# Patient Record
Sex: Female | Born: 1970 | Race: White | Hispanic: No | Marital: Single | Smoking: Current every day smoker
Health system: Southern US, Community
[De-identification: ages and names within clinical notes are randomized; demographics above are authoritative.]

## PROBLEM LIST (undated history)

## (undated) ENCOUNTER — Emergency Department (HOSPITAL_COMMUNITY): Discharge status: Absent without leave | Payer: Self-pay

## (undated) DIAGNOSIS — Z8774 Personal history of (corrected) congenital malformations of heart and circulatory system: Secondary | ICD-10-CM

## (undated) DIAGNOSIS — I480 Paroxysmal atrial fibrillation: Secondary | ICD-10-CM

## (undated) DIAGNOSIS — Z954 Presence of other heart-valve replacement: Secondary | ICD-10-CM

## (undated) DIAGNOSIS — Z8701 Personal history of pneumonia (recurrent): Secondary | ICD-10-CM

## (undated) DIAGNOSIS — R9431 Abnormal electrocardiogram [ECG] [EKG]: Secondary | ICD-10-CM

## (undated) DIAGNOSIS — I2721 Secondary pulmonary arterial hypertension: Secondary | ICD-10-CM

## (undated) DIAGNOSIS — I5042 Chronic combined systolic (congestive) and diastolic (congestive) heart failure: Secondary | ICD-10-CM

## (undated) DIAGNOSIS — I428 Other cardiomyopathies: Secondary | ICD-10-CM

## (undated) DIAGNOSIS — I351 Nonrheumatic aortic (valve) insufficiency: Secondary | ICD-10-CM

## (undated) DIAGNOSIS — Q249 Congenital malformation of heart, unspecified: Secondary | ICD-10-CM

## (undated) DIAGNOSIS — K766 Portal hypertension: Secondary | ICD-10-CM

## (undated) DIAGNOSIS — J449 Chronic obstructive pulmonary disease, unspecified: Secondary | ICD-10-CM

## (undated) DIAGNOSIS — F419 Anxiety disorder, unspecified: Secondary | ICD-10-CM

## (undated) DIAGNOSIS — Q244 Congenital subaortic stenosis: Secondary | ICD-10-CM

## (undated) HISTORY — PX: RECTAL SURGERY: SHX760

## (undated) HISTORY — DX: Secondary pulmonary arterial hypertension: I27.21

## (undated) HISTORY — PX: TUBAL LIGATION: SHX77

## (undated) HISTORY — DX: Portal hypertension: K76.6

## (undated) HISTORY — DX: Other cardiomyopathies: I42.8

## (undated) HISTORY — DX: Paroxysmal atrial fibrillation: I48.0

## (undated) HISTORY — PX: CARDIAC CATHETERIZATION: SHX172

## (undated) HISTORY — DX: Abnormal electrocardiogram (ECG) (EKG): R94.31

---

## 1999-12-08 ENCOUNTER — Encounter: Payer: Self-pay | Admitting: Family Medicine

## 1999-12-08 ENCOUNTER — Ambulatory Visit (HOSPITAL_COMMUNITY): Admission: RE | Admit: 1999-12-08 | Discharge: 1999-12-08 | Payer: Self-pay | Admitting: Family Medicine

## 1999-12-14 ENCOUNTER — Encounter (HOSPITAL_BASED_OUTPATIENT_CLINIC_OR_DEPARTMENT_OTHER): Payer: Self-pay | Admitting: General Surgery

## 1999-12-15 ENCOUNTER — Ambulatory Visit (HOSPITAL_COMMUNITY): Admission: RE | Admit: 1999-12-15 | Discharge: 1999-12-15 | Payer: Self-pay | Admitting: Family Medicine

## 1999-12-18 ENCOUNTER — Ambulatory Visit (HOSPITAL_COMMUNITY): Admission: RE | Admit: 1999-12-18 | Discharge: 1999-12-18 | Payer: Self-pay | Admitting: General Surgery

## 2000-08-14 ENCOUNTER — Encounter: Payer: Self-pay | Admitting: Family Medicine

## 2000-08-14 ENCOUNTER — Encounter: Admission: RE | Admit: 2000-08-14 | Discharge: 2000-08-14 | Payer: Self-pay | Admitting: Family Medicine

## 2000-12-24 ENCOUNTER — Emergency Department (HOSPITAL_COMMUNITY): Admission: EM | Admit: 2000-12-24 | Discharge: 2000-12-24 | Payer: Self-pay | Admitting: Emergency Medicine

## 2001-03-24 ENCOUNTER — Emergency Department (HOSPITAL_COMMUNITY): Admission: EM | Admit: 2001-03-24 | Discharge: 2001-03-24 | Payer: Self-pay | Admitting: Emergency Medicine

## 2001-07-07 ENCOUNTER — Encounter: Payer: Self-pay | Admitting: Emergency Medicine

## 2001-07-07 ENCOUNTER — Emergency Department (HOSPITAL_COMMUNITY): Admission: EM | Admit: 2001-07-07 | Discharge: 2001-07-07 | Payer: Self-pay | Admitting: Emergency Medicine

## 2001-11-26 ENCOUNTER — Emergency Department (HOSPITAL_COMMUNITY): Admission: EM | Admit: 2001-11-26 | Discharge: 2001-11-26 | Payer: Self-pay

## 2003-02-26 ENCOUNTER — Emergency Department (HOSPITAL_COMMUNITY): Admission: EM | Admit: 2003-02-26 | Discharge: 2003-02-26 | Payer: Self-pay | Admitting: *Deleted

## 2003-02-26 ENCOUNTER — Encounter: Payer: Self-pay | Admitting: *Deleted

## 2003-02-26 ENCOUNTER — Encounter: Payer: Self-pay | Admitting: Emergency Medicine

## 2011-03-15 ENCOUNTER — Inpatient Hospital Stay (INDEPENDENT_AMBULATORY_CARE_PROVIDER_SITE_OTHER)
Admission: RE | Admit: 2011-03-15 | Discharge: 2011-03-15 | Disposition: A | Discharge status: Home / Self Care | Payer: Self-pay | Source: Ambulatory Visit | Attending: Family Medicine | Admitting: Family Medicine

## 2011-03-15 DIAGNOSIS — L723 Sebaceous cyst: Secondary | ICD-10-CM

## 2011-03-15 DIAGNOSIS — L02219 Cutaneous abscess of trunk, unspecified: Secondary | ICD-10-CM

## 2011-03-15 DIAGNOSIS — L03319 Cellulitis of trunk, unspecified: Secondary | ICD-10-CM

## 2011-07-24 ENCOUNTER — Encounter: Payer: Self-pay | Admitting: Emergency Medicine

## 2011-07-24 ENCOUNTER — Emergency Department (HOSPITAL_COMMUNITY)
Admission: EM | Admit: 2011-07-24 | Discharge: 2011-07-25 | Disposition: A | Discharge status: Home / Self Care | Payer: Self-pay | Attending: Emergency Medicine | Admitting: Emergency Medicine

## 2011-07-24 DIAGNOSIS — R062 Wheezing: Secondary | ICD-10-CM | POA: Insufficient documentation

## 2011-07-24 DIAGNOSIS — R05 Cough: Secondary | ICD-10-CM | POA: Insufficient documentation

## 2011-07-24 DIAGNOSIS — R0602 Shortness of breath: Secondary | ICD-10-CM | POA: Insufficient documentation

## 2011-07-24 DIAGNOSIS — IMO0001 Reserved for inherently not codable concepts without codable children: Secondary | ICD-10-CM | POA: Insufficient documentation

## 2011-07-24 DIAGNOSIS — R42 Dizziness and giddiness: Secondary | ICD-10-CM | POA: Insufficient documentation

## 2011-07-24 DIAGNOSIS — R059 Cough, unspecified: Secondary | ICD-10-CM | POA: Insufficient documentation

## 2011-07-24 DIAGNOSIS — J159 Unspecified bacterial pneumonia: Secondary | ICD-10-CM | POA: Insufficient documentation

## 2011-07-24 DIAGNOSIS — R0789 Other chest pain: Secondary | ICD-10-CM | POA: Insufficient documentation

## 2011-07-24 DIAGNOSIS — M2550 Pain in unspecified joint: Secondary | ICD-10-CM | POA: Insufficient documentation

## 2011-07-24 DIAGNOSIS — R0989 Other specified symptoms and signs involving the circulatory and respiratory systems: Secondary | ICD-10-CM | POA: Insufficient documentation

## 2011-07-24 DIAGNOSIS — R509 Fever, unspecified: Secondary | ICD-10-CM | POA: Insufficient documentation

## 2011-07-24 NOTE — ED Notes (Signed)
Patient states she has been sick for two weeks, increasingly getting worse.  Patient feels feverish and having chills, cough.

## 2011-07-25 ENCOUNTER — Emergency Department (HOSPITAL_COMMUNITY): Payer: Self-pay

## 2011-07-25 LAB — URINALYSIS, ROUTINE W REFLEX MICROSCOPIC
Glucose, UA: NEGATIVE mg/dL
Ketones, ur: 15 mg/dL — AB
Nitrite: NEGATIVE
Protein, ur: 30 mg/dL — AB
Specific Gravity, Urine: 1.027 (ref 1.005–1.030)
Urobilinogen, UA: 0.2 mg/dL (ref 0.0–1.0)
pH: 5.5 (ref 5.0–8.0)

## 2011-07-25 LAB — URINE CULTURE
Colony Count: NO GROWTH
Culture: NO GROWTH

## 2011-07-25 LAB — BASIC METABOLIC PANEL
BUN: 12 mg/dL (ref 6–23)
CO2: 24 mEq/L (ref 19–32)
Calcium: 9.4 mg/dL (ref 8.4–10.5)
Chloride: 98 mEq/L (ref 96–112)
Creatinine, Ser: 0.85 mg/dL (ref 0.50–1.10)
GFR calc Af Amer: >90 mL/min (ref >90)
GFR calc non Af Amer: 85 mL/min — ABNORMAL LOW (ref >90)
Glucose, Bld: 125 mg/dL — ABNORMAL HIGH (ref 70–99)
Potassium: 4.1 mEq/L (ref 3.5–5.1)
Sodium: 134 mEq/L — ABNORMAL LOW (ref 135–145)

## 2011-07-25 LAB — URINE MICROSCOPIC-ADD ON

## 2011-07-25 LAB — DIFFERENTIAL
Basophils Absolute: 0 10*3/uL (ref 0.0–0.1)
Basophils Relative: 0 % (ref 0–1)
Eosinophils Absolute: 0 10*3/uL (ref 0.0–0.7)
Neutrophils Relative %: 72 % (ref 43–77)

## 2011-07-25 LAB — CBC
MCH: 30.3 pg (ref 26.0–34.0)
MCHC: 34.1 g/dL (ref 30.0–36.0)
Platelets: 168 10*3/uL (ref 150–400)
RDW: 13.1 % (ref 11.5–15.5)

## 2011-07-25 MED ORDER — ALBUTEROL SULFATE (5 MG/ML) 0.5% IN NEBU: 2.5000 mg | INHALATION_SOLUTION | Freq: Once | RESPIRATORY_TRACT | Status: DC

## 2011-07-25 MED ORDER — ACETAMINOPHEN 325 MG PO TABS
650.0000 mg | ORAL_TABLET | Freq: Once | ORAL | Status: AC
  Administered 2011-07-25: 650 mg via ORAL
  Filled 2011-07-25: qty 1

## 2011-07-25 MED ORDER — HYDROCOD POLST-CHLORPHEN POLST 10-8 MG/5ML PO LQCR
5.0000 mL | Freq: Once | ORAL | Status: AC
  Administered 2011-07-25: 5 mL via ORAL
  Filled 2011-07-25: qty 5

## 2011-07-25 MED ORDER — ALBUTEROL SULFATE HFA 108 (90 BASE) MCG/ACT IN AERS
2.0000 | INHALATION_SPRAY | RESPIRATORY_TRACT | Status: DC
  Administered 2011-07-25: 2 via RESPIRATORY_TRACT
  Filled 2011-07-25: qty 6.7

## 2011-07-25 MED ORDER — ALBUTEROL SULFATE (5 MG/ML) 0.5% IN NEBU
5.0000 mg | INHALATION_SOLUTION | Freq: Once | RESPIRATORY_TRACT | Status: AC
  Administered 2011-07-25: 5 mg via RESPIRATORY_TRACT
  Filled 2011-07-25: qty 1

## 2011-07-25 MED ORDER — ALBUTEROL SULFATE HFA 108 (90 BASE) MCG/ACT IN AERS: 1.0000 | INHALATION_SPRAY | Freq: Four times a day (QID) | RESPIRATORY_TRACT | Status: DC | PRN

## 2011-07-25 MED ORDER — HYDROCOD POLST-CHLORPHEN POLST 10-8 MG/5ML PO LQCR: 5.0000 mL | Freq: Every evening | ORAL | Status: DC | PRN

## 2011-07-25 MED ORDER — AZITHROMYCIN 250 MG PO TABS
500.0000 mg | ORAL_TABLET | Freq: Once | ORAL | Status: AC
  Administered 2011-07-25: 500 mg via ORAL
  Filled 2011-07-25: qty 2

## 2011-07-25 MED ORDER — AZITHROMYCIN 250 MG PO TABS: 500.0000 mg | ORAL_TABLET | Freq: Every day | ORAL | Status: AC

## 2011-07-25 NOTE — ED Provider Notes (Addendum)
History     CSN: [id number] Arrival date & time: 07/24/2011  8:07 PM   First MD Initiated Contact with Patient 07/25/11 0010      Chief Complaint  Patient presents with  . Fever    cold chills, sick for two weeks    (Consider location/radiation/quality/duration/timing/severity/associated sxs/prior treatment) HPI 40 year old female presents to emergency department with complaint of 2 weeks of ongoing cough, body aches, and fever. Patient reports today she was dizzy and lightheaded and felt she was going to pass out. She reports initially the cough was productive but now is dry and painful. She denies any nausea and vomiting. She has had some right-sided crampy pain today. Patient with history of heart surgery as a child, no problems since that time. Patient is a smoker. No flu shot no known sick contacts Past Medical History  Diagnosis Date  . Mitral and aortic heart valve diseases, unspecified     Past Surgical History  Procedure Date  . Cardiac surgery   . Cesarean section   . Rectal surgery   . Tubal ligation     No family history on file.  History  Substance Use Topics  . Smoking status: Current Everyday Smoker  . Smokeless tobacco: Not on file  . Alcohol Use: Yes     occasionally    OB History    Grav Para Term Preterm Abortions TAB SAB Ect Mult Living                  Review of Systems  Constitutional: Negative.   HENT: Negative.   Eyes: Negative.   Respiratory: Positive for cough, chest tightness, shortness of breath and wheezing.   Cardiovascular: Negative.   Gastrointestinal: Positive for abdominal pain.  Genitourinary: Negative.   Musculoskeletal: Positive for myalgias and arthralgias.  Skin: Negative.   Neurological: Negative.   Hematological: Negative.   Psychiatric/Behavioral: Negative.   All other systems reviewed and are negative.    Allergies  Penicillins cross reactors  Home Medications   Current Outpatient Rx  Name Route Sig  Dispense Refill  . PSEUDOEPHEDRINE-APAP-DM 62-130-86 MG/30ML PO LIQD Oral Take 30 mLs by mouth every 6 (six) hours as needed. For cold and flu like symptoms       BP 106/46  Pulse 105  Temp(Src) 102.5 F (39.2 C) (Rectal)  Resp 17  Ht 5\' 3"  (1.6 m)  Wt 100 lb (45.36 kg)  BMI 17.71 kg/m2  SpO2 97%  LMP 07/02/2011  Physical Exam  Nursing note and vitals reviewed. Constitutional: She is oriented to person, place, and time. She appears well-developed and well-nourished.       Patient appears to be ill, thin  HENT:  Head: Normocephalic and atraumatic.  Right Ear: External ear normal.  Left Ear: External ear normal.  Nose: Nose normal.  Mouth/Throat: Oropharynx is clear and moist.  Eyes: Conjunctivae and EOM are normal. Pupils are equal, round, and reactive to light.  Neck: Normal range of motion. Neck supple. No JVD present. No tracheal deviation present. No thyromegaly present.  Cardiovascular: Normal rate, regular rhythm, normal heart sounds and intact distal pulses.  Exam reveals no gallop and no friction rub.   No murmur heard. Pulmonary/Chest: Effort normal. No stridor. No respiratory distress. She has wheezes. She has rales. She exhibits no tenderness.       Significant cough  Abdominal: Soft. Bowel sounds are normal. She exhibits no distension and no mass. There is no tenderness. There is no rebound and no guarding.  Musculoskeletal: Normal range of motion. She exhibits no edema and no tenderness.  Lymphadenopathy:    She has no cervical adenopathy.  Neurological: She is oriented to person, place, and time. She has normal reflexes. No cranial nerve deficit. She exhibits normal muscle tone. Coordination normal.  Skin: Skin is dry. No rash noted. No erythema. No pallor.  Psychiatric: She has a normal mood and affect. Her behavior is normal. Judgment and thought content normal.    ED Course  Procedures (including critical care time)   Labs Reviewed  CBC  DIFFERENTIAL    BASIC METABOLIC PANEL  URINALYSIS, ROUTINE W REFLEX MICROSCOPIC   Dg Chest 2 View  07/25/2011  *RADIOLOGY REPORT*  Clinical Data: 40 year old female with fever and cough.  CHEST - 2 VIEW  Comparison: None.  Findings: Cardiomegaly. Other mediastinal contours are within normal limits.  Lung volumes appear large.  There are coarse reticular pulmonary opacities in both lungs.  No definite bronchiectasis.  No consolidation or effusion.  No pneumothorax or edema. No acute osseous abnormality identified.    IMPRESSION: Cardiomegaly with diffuse pulmonary interstitial changes.  Given history of fever and cough, viral / atypical respiratory infection is favored over chronic lung disease.  Original Report Authenticated By: Harley Hallmark, M.D.     No diagnosis found.    MDM  40 year old female with fever, cough and chest x-ray consistent with atypical respiratory infection or viral infection. Suspect patient has underlying COPD or emphysema. Will treat with Zithromax and albuterol. Patient instructed to alternate Tylenol and Motrin for body aches. Urine with 11-20 whites and many squamous epithelials and few bacteria. Will send for culture and treat if necessary. Patient without complaints of dysuria. Abdominal pain most likely due to 2 persistent cough        Olivia Mackie, MD 07/25/11 0130 2:01 AM Feeling better after nebulized treatment. We'll continue Zithromax and albuterol along with Tussionex for nighttime use  Olivia Mackie, MD 07/25/11 0202

## 2011-07-25 NOTE — ED Notes (Signed)
Patient with right lower quadrant pain, no nausea or vomiting.  Patient does have fever, has been febrile and not feeling well off and on for two weeks.  Patient with chills and cough.

## 2012-04-23 ENCOUNTER — Encounter (HOSPITAL_COMMUNITY): Payer: Self-pay | Admitting: *Deleted

## 2012-04-23 ENCOUNTER — Emergency Department (HOSPITAL_COMMUNITY)
Admission: EM | Admit: 2012-04-23 | Discharge: 2012-04-23 | Disposition: A | Discharge status: Home / Self Care | Payer: Self-pay | Attending: Emergency Medicine | Admitting: Emergency Medicine

## 2012-04-23 DIAGNOSIS — J020 Streptococcal pharyngitis: Secondary | ICD-10-CM | POA: Insufficient documentation

## 2012-04-23 DIAGNOSIS — F17200 Nicotine dependence, unspecified, uncomplicated: Secondary | ICD-10-CM | POA: Insufficient documentation

## 2012-04-23 MED ORDER — HYDROCODONE-ACETAMINOPHEN 7.5-500 MG/15ML PO SOLN: 15.0000 mL | Freq: Four times a day (QID) | ORAL | Status: AC | PRN

## 2012-04-23 MED ORDER — PENICILLIN G BENZATHINE 1200000 UNIT/2ML IM SUSP
1.2000 10*6.[IU] | Freq: Once | INTRAMUSCULAR | Status: DC
  Filled 2012-04-23: qty 2

## 2012-04-23 MED ORDER — AZITHROMYCIN 250 MG PO TABS: ORAL_TABLET | ORAL | Status: AC

## 2012-04-23 MED ORDER — IBUPROFEN 800 MG PO TABS
800.0000 mg | ORAL_TABLET | Freq: Once | ORAL | Status: AC
  Administered 2012-04-23: 800 mg via ORAL
  Filled 2012-04-23: qty 1

## 2012-04-23 NOTE — ED Provider Notes (Signed)
History     CSN: [id number]  Arrival date & time 04/23/12  6295   First MD Initiated Contact with Patient 04/23/12 [phone]      Chief Complaint  Patient presents with  . Sore Throat    (Consider location/radiation/quality/duration/timing/severity/associated sxs/prior treatment) HPI Patient for sore throat which has been present over the past several days. It is worse on the left side. She has had no fever or swollen glands. She does report having a cough associated. She has no specific sick contacts. She has no neck pain headache or stomach pain. No rash. She has tried ibuprofen for the discomfort with some relief. She has no difficulty swallowing or breathing.There are no other associated systemic symptoms, there are no other alleviating or modifying factors.   Past Medical History  Diagnosis Date  . Mitral and aortic heart valve diseases, unspecified     Past Surgical History  Procedure Date  . Cardiac surgery   . Cesarean section   . Rectal surgery   . Tubal ligation     History reviewed. No pertinent family history.  History  Substance Use Topics  . Smoking status: Current Everyday Smoker  . Smokeless tobacco: Not on file  . Alcohol Use: Yes     occasionally    OB History    Grav Para Term Preterm Abortions TAB SAB Ect Mult Living                  Review of Systems ROS reviewed and all otherwise negative except for mentioned in HPI  Allergies  Penicillins cross reactors  Home Medications   Current Outpatient Rx  Name Route Sig Dispense Refill  . IBUPROFEN 200 MG PO TABS Oral Take 200 mg by mouth every 6 (six) hours as needed. For pain      BP 135/69  Pulse 77  Temp 97.8 F (36.6 C) (Oral)  Resp 16  SpO2 100% Vitals reviewed Physical Exam Physical Examination: General appearance - alert, well appearing, and in no distress Mental status - alert, oriented to person, place, and time Eyes - no conjunctival injection, no scleral icterus Mouth - MMM,  OP with moderate erythema, exudate overlying left tonsil, palate symmetric, uvula midline Neck - supple, no significant adenopathy Chest - clear to auscultation, no wheezes, rales or rhonchi, symmetric air entry Heart - normal rate, regular rhythm, normal S1, S2, no murmurs, rubs, clicks or gallops Extremities - peripheral pulses normal, no pedal edema, no clubbing or cyanosis Skin - normal coloration and turgor, no rashes, brisk cap refill Psych- normal mood and affect  ED Course  Procedures (including critical care time)  Labs Reviewed  RAPID STREP SCREEN - Abnormal; Notable for the following:    Streptococcus, Group A Screen (Direct) POSITIVE (*)     All other components within normal limits   No results found.   No diagnosis found.    MDM  Patient presenting with pharyngitis. Her rapid strep test is positive. She will be treated with azithromycin due to penicillin allergy and discharged with strict return precautions. Prescription for Lortab elixir given for throat pain. Strict return precautions were discussed and she is agreeable with this plan.        Ethelda Chick, MD 04/23/12 1004

## 2012-04-23 NOTE — ED Notes (Signed)
Reports sore throat for several days, has white patch on throat. Airway intact at triage.

## 2015-10-15 ENCOUNTER — Emergency Department (HOSPITAL_COMMUNITY)
Admission: EM | Admit: 2015-10-15 | Discharge: 2015-10-15 | Disposition: A | Discharge status: Home / Self Care | Payer: Self-pay | Attending: Emergency Medicine | Admitting: Emergency Medicine

## 2015-10-15 ENCOUNTER — Encounter (HOSPITAL_COMMUNITY): Payer: Self-pay | Admitting: *Deleted

## 2015-10-15 DIAGNOSIS — R2243 Localized swelling, mass and lump, lower limb, bilateral: Secondary | ICD-10-CM | POA: Insufficient documentation

## 2015-10-15 DIAGNOSIS — R609 Edema, unspecified: Secondary | ICD-10-CM

## 2015-10-15 DIAGNOSIS — Z880 Allergy status to penicillin: Secondary | ICD-10-CM | POA: Insufficient documentation

## 2015-10-15 DIAGNOSIS — Z8679 Personal history of other diseases of the circulatory system: Secondary | ICD-10-CM | POA: Insufficient documentation

## 2015-10-15 DIAGNOSIS — R002 Palpitations: Secondary | ICD-10-CM | POA: Insufficient documentation

## 2015-10-15 DIAGNOSIS — F17200 Nicotine dependence, unspecified, uncomplicated: Secondary | ICD-10-CM | POA: Insufficient documentation

## 2015-10-15 LAB — BASIC METABOLIC PANEL
ANION GAP: 13 (ref 5–15)
BUN: 19 mg/dL (ref 6–20)
CHLORIDE: 107 mmol/L (ref 101–111)
CO2: 22 mmol/L (ref 22–32)
Calcium: 8.9 mg/dL (ref 8.9–10.3)
Creatinine, Ser: 0.93 mg/dL (ref 0.44–1.00)
GFR calc non Af Amer: >60 mL/min (ref >60)
Glucose, Bld: 120 mg/dL — ABNORMAL HIGH (ref 65–99)
POTASSIUM: 3.7 mmol/L (ref 3.5–5.1)
SODIUM: 142 mmol/L (ref 135–145)

## 2015-10-15 MED ORDER — HYDROCHLOROTHIAZIDE 12.5 MG PO CAPS
25.0000 mg | ORAL_CAPSULE | Freq: Once | ORAL | Status: AC
  Administered 2015-10-15: 25 mg via ORAL
  Filled 2015-10-15: qty 2

## 2015-10-15 MED ORDER — HYDROCHLOROTHIAZIDE 25 MG PO TABS: 25.0000 mg | ORAL_TABLET | Freq: Every day | ORAL | Status: DC

## 2015-10-15 NOTE — Discharge Instructions (Signed)
Edema °Edema is an abnormal buildup of fluids in your body tissues. Edema is somewhat dependent on gravity to pull the fluid to the lowest place in your body. That makes the condition more common in the legs and thighs (lower extremities). Painless swelling of the feet and ankles is common and becomes more likely as you get older. It is also common in looser tissues, like around your eyes.  °When the affected area is squeezed, the fluid may move out of that spot and leave a dent for a few moments. This dent is called pitting.  °CAUSES  °There are many possible causes of edema. Eating too much salt and being on your feet or sitting for a long time can cause edema in your legs and ankles. Hot weather may make edema worse. Common medical causes of edema include: °· Heart failure. °· Liver disease. °· Kidney disease. °· Weak blood vessels in your legs. °· Cancer. °· An injury. °· Pregnancy. °· Some medications. °· Obesity.  °SYMPTOMS  °Edema is usually painless. Your skin may look swollen or shiny.  °DIAGNOSIS  °Your health care provider may be able to diagnose edema by asking about your medical history and doing a physical exam. You may need to have tests such as X-rays, an electrocardiogram, or blood tests to check for medical conditions that may cause edema.  °TREATMENT  °Edema treatment depends on the cause. If you have heart, liver, or kidney disease, you need the treatment appropriate for these conditions. General treatment may include: °· Elevation of the affected body part above the level of your heart. °· Compression of the affected body part. Pressure from elastic bandages or support stockings squeezes the tissues and forces fluid back into the blood vessels. This keeps fluid from entering the tissues. °· Restriction of fluid and salt intake. °· Use of a water pill (diuretic). These medications are appropriate only for some types of edema. They pull fluid out of your body and make you urinate more often. This  gets rid of fluid and reduces swelling, but diuretics can have side effects. Only use diuretics as directed by your health care provider. °HOME CARE INSTRUCTIONS  °· Keep the affected body part above the level of your heart when you are lying down.   °· Do not sit still or stand for prolonged periods.   °· Do not put anything directly under your knees when lying down. °· Do not wear constricting clothing or garters on your upper legs.   °· Exercise your legs to work the fluid back into your blood vessels. This may help the swelling go down.   °· Wear elastic bandages or support stockings to reduce ankle swelling as directed by your health care provider.   °· Eat a low-salt diet to reduce fluid if your health care provider recommends it.   °· Only take medicines as directed by your health care provider.  °SEEK MEDICAL CARE IF:  °· Your edema is not responding to treatment. °· You have heart, liver, or kidney disease and notice symptoms of edema. °· You have edema in your legs that does not improve after elevating them.   °· You have sudden and unexplained weight gain. °SEEK IMMEDIATE MEDICAL CARE IF:  °· You develop shortness of breath or chest pain.   °· You cannot breathe when you lie down. °· You develop pain, redness, or warmth in the swollen areas.   °· You have heart, liver, or kidney disease and suddenly get edema. °· You have a fever and your symptoms suddenly get worse. °MAKE SURE YOU:  °·   Understand these instructions. °· Will watch your condition. °· Will get help right away if you are not doing well or get worse. °  °This information is not intended to replace advice given to you by your health care provider. Make sure you discuss any questions you have with your health care provider. °  °Document Released: 08/27/2005 Document Revised: 09/17/2014 Document Reviewed: 06/19/2013 °Elsevier Interactive Patient Education ©2016 [employer] ° °

## 2015-10-15 NOTE — ED Notes (Signed)
Pt complains of swelling in her legs bilaterally over the past couple of weeks. Pt states she is also concerned about heart palpitations she has had for the past several years. Pt states she stopped smoking last month. Pt states she has hx of irregular heartbeat, but is not sure what type. Pt denies chest pain, shortness of breath.

## 2015-10-16 NOTE — ED Provider Notes (Signed)
CSN: [id number]     Arrival date & time 10/15/15  1234 History   First MD Initiated Contact with Patient 10/15/15 1256     Chief Complaint  Patient presents with  . Leg Swelling  . Palpitations      HPI  Patient presents for evaluation of bilateral lower extremity edema. Symptoms present for the last several weeks. She works on her feet at [employer] daily. No history of hypertension diabetes. No renal or heart disease. However, as a child she had several percutaneous procedures for "valve problems".  Occasional for palpitations. States this incident almost her whole life.  However, she states "they might have been a little more common in the last few weeks." Does not describe runs of tachycardia palpitations. Simple irregularity and single "hard beats".  Past Medical History  Diagnosis Date  . Mitral and aortic heart valve diseases, unspecified    Past Surgical History  Procedure Laterality Date  . Cardiac surgery    . Cesarean section    . Rectal surgery    . Tubal ligation     No family history on file. Social History  Substance Use Topics  . Smoking status: Current Every Day Smoker  . Smokeless tobacco: None  . Alcohol Use: Yes     Comment: occasionally   OB History    No data available     Review of Systems  Constitutional: Negative for fever, chills, diaphoresis, appetite change and fatigue.  HENT: Negative for mouth sores, sore throat and trouble swallowing.   Eyes: Negative for visual disturbance.  Respiratory: Negative for cough, chest tightness, shortness of breath and wheezing.   Cardiovascular: Negative for chest pain.  Gastrointestinal: Negative for nausea, vomiting, abdominal pain, diarrhea and abdominal distention.  Endocrine: Negative for polydipsia, polyphagia and polyuria.  Genitourinary: Negative for dysuria, frequency and hematuria.  Musculoskeletal: Negative for gait problem.  Skin: Negative for color change, pallor and rash.  Neurological:  Negative for dizziness, syncope, light-headedness and headaches.  Hematological: Does not bruise/bleed easily.  Psychiatric/Behavioral: Negative for behavioral problems and confusion.      Allergies  Penicillins cross reactors  Home Medications   Prior to Admission medications   Medication Sig Start Date End Date Taking? Authorizing Provider  ibuprofen (ADVIL,MOTRIN) 200 MG tablet Take 400 mg by mouth every 6 (six) hours as needed for moderate pain. For pain   Yes Historical Provider, MD  hydrochlorothiazide (HYDRODIURIL) 25 MG tablet Take 1 tablet (25 mg total) by mouth daily. 10/15/15   Rolland Porter, MD   BP 126/56 mmHg  Pulse 79  Temp(Src) 98 F (36.7 C)  Resp 25  SpO2 96%  LMP 10/15/2015 Physical Exam  Constitutional: She is oriented to person, place, and time. She appears well-developed and well-nourished. No distress.  HENT:  Head: Normocephalic.  Eyes: Conjunctivae are normal. Pupils are equal, round, and reactive to light. No scleral icterus.  Neck: Normal range of motion. Neck supple. No thyromegaly present.  Cardiovascular: Normal rate and regular rhythm.  Exam reveals no gallop and no friction rub.   No murmur heard. Trace BLE Edema  Pulmonary/Chest: Effort normal and breath sounds normal. No respiratory distress. She has no wheezes. She has no rales.  Abdominal: Soft. Bowel sounds are normal. She exhibits no distension. There is no tenderness. There is no rebound.  Musculoskeletal: Normal range of motion.  Neurological: She is alert and oriented to person, place, and time.  Skin: Skin is warm and dry. No rash noted.  Psychiatric:  She has a normal mood and affect. Her behavior is normal.    ED Course  Procedures (including critical care time) Labs Review Labs Reviewed  BASIC METABOLIC PANEL - Abnormal; Notable for the following:    Glucose, Bld 120 (*)    All other components within normal limits    Imaging Review No results found. I have personally reviewed  and evaluated these images and lab results as part of my medical decision-making.   EKG Interpretation   Date/Time:  Saturday October 15 2015 13:33:07 EST Ventricular Rate:  74 PR Interval:  144 QRS Duration: 103 QT Interval:  425 QTC Calculation: 471 R Axis:   67 Text Interpretation:  Ectopic atrial rhythm Multiple premature complexes,  vent & supraven LVH with secondary repolarization abnormality Confirmed by  Fayrene Fearing  MD, Jannett Schmall ([id number]) on 10/15/2015 2:45:09 PM      MDM   Final diagnoses:  Dependent edema    Normal kidney function.  No EKG changes. No signs/sx CHF. Plan PRN HCTZ. EKG and monitor showed occasional PVCs.    Rolland Porter, MD 10/16/15 [phone]

## 2015-11-03 ENCOUNTER — Emergency Department (HOSPITAL_COMMUNITY): Payer: Self-pay

## 2015-11-03 ENCOUNTER — Inpatient Hospital Stay (HOSPITAL_COMMUNITY): Payer: Self-pay

## 2015-11-03 ENCOUNTER — Inpatient Hospital Stay (HOSPITAL_COMMUNITY)
Admission: EM | Admit: 2015-11-03 | Discharge: 2015-11-10 | DRG: 194 | Disposition: A | Discharge status: Home / Self Care | Payer: Self-pay | Attending: Infectious Disease | Admitting: Infectious Disease

## 2015-11-03 ENCOUNTER — Encounter (HOSPITAL_COMMUNITY): Payer: Self-pay | Admitting: Emergency Medicine

## 2015-11-03 DIAGNOSIS — R7303 Prediabetes: Secondary | ICD-10-CM | POA: Diagnosis present

## 2015-11-03 DIAGNOSIS — Z22322 Carrier or suspected carrier of Methicillin resistant Staphylococcus aureus: Secondary | ICD-10-CM

## 2015-11-03 DIAGNOSIS — Z8249 Family history of ischemic heart disease and other diseases of the circulatory system: Secondary | ICD-10-CM

## 2015-11-03 DIAGNOSIS — F17200 Nicotine dependence, unspecified, uncomplicated: Secondary | ICD-10-CM | POA: Diagnosis present

## 2015-11-03 DIAGNOSIS — Z23 Encounter for immunization: Secondary | ICD-10-CM

## 2015-11-03 DIAGNOSIS — E876 Hypokalemia: Secondary | ICD-10-CM

## 2015-11-03 DIAGNOSIS — Z880 Allergy status to penicillin: Secondary | ICD-10-CM

## 2015-11-03 DIAGNOSIS — R0600 Dyspnea, unspecified: Secondary | ICD-10-CM

## 2015-11-03 DIAGNOSIS — R05 Cough: Secondary | ICD-10-CM

## 2015-11-03 DIAGNOSIS — I4891 Unspecified atrial fibrillation: Secondary | ICD-10-CM | POA: Diagnosis present

## 2015-11-03 DIAGNOSIS — R059 Cough, unspecified: Secondary | ICD-10-CM

## 2015-11-03 DIAGNOSIS — R011 Cardiac murmur, unspecified: Secondary | ICD-10-CM | POA: Insufficient documentation

## 2015-11-03 DIAGNOSIS — J189 Pneumonia, unspecified organism: Principal | ICD-10-CM | POA: Diagnosis present

## 2015-11-03 DIAGNOSIS — I080 Rheumatic disorders of both mitral and aortic valves: Secondary | ICD-10-CM | POA: Diagnosis present

## 2015-11-03 DIAGNOSIS — Z79899 Other long term (current) drug therapy: Secondary | ICD-10-CM

## 2015-11-03 DIAGNOSIS — I359 Nonrheumatic aortic valve disorder, unspecified: Secondary | ICD-10-CM | POA: Insufficient documentation

## 2015-11-03 DIAGNOSIS — Q248 Other specified congenital malformations of heart: Secondary | ICD-10-CM

## 2015-11-03 DIAGNOSIS — I429 Cardiomyopathy, unspecified: Secondary | ICD-10-CM | POA: Diagnosis present

## 2015-11-03 DIAGNOSIS — Q249 Congenital malformation of heart, unspecified: Secondary | ICD-10-CM | POA: Insufficient documentation

## 2015-11-03 DIAGNOSIS — I4892 Unspecified atrial flutter: Secondary | ICD-10-CM | POA: Diagnosis present

## 2015-11-03 DIAGNOSIS — I4819 Other persistent atrial fibrillation: Secondary | ICD-10-CM | POA: Diagnosis present

## 2015-11-03 LAB — COMPREHENSIVE METABOLIC PANEL
ALK PHOS: 118 U/L (ref 38–126)
ALT: 27 U/L (ref 14–54)
AST: 43 U/L — ABNORMAL HIGH (ref 15–41)
Albumin: 3.5 g/dL (ref 3.5–5.0)
Anion gap: 15 (ref 5–15)
BUN: 19 mg/dL (ref 6–20)
CHLORIDE: 96 mmol/L — AB (ref 101–111)
CO2: 28 mmol/L (ref 22–32)
CREATININE: 0.95 mg/dL (ref 0.44–1.00)
Calcium: 9.2 mg/dL (ref 8.9–10.3)
Glucose, Bld: 164 mg/dL — ABNORMAL HIGH (ref 65–99)
Potassium: 3.4 mmol/L — ABNORMAL LOW (ref 3.5–5.1)
Sodium: 139 mmol/L (ref 135–145)
TOTAL PROTEIN: 7.7 g/dL (ref 6.5–8.1)
Total Bilirubin: 1.7 mg/dL — ABNORMAL HIGH (ref 0.3–1.2)

## 2015-11-03 LAB — CBC WITH DIFFERENTIAL/PLATELET
BASOS ABS: 0 10*3/uL (ref 0.0–0.1)
Basophils Relative: 0 %
Eosinophils Absolute: 0.3 10*3/uL (ref 0.0–0.7)
Eosinophils Relative: 3 %
HEMATOCRIT: 45.5 % (ref 36.0–46.0)
Hemoglobin: 15.4 g/dL — ABNORMAL HIGH (ref 12.0–15.0)
LYMPHS PCT: 22 %
Lymphs Abs: 2.5 10*3/uL (ref 0.7–4.0)
MCH: 31 pg (ref 26.0–34.0)
MCHC: 33.8 g/dL (ref 30.0–36.0)
MCV: 91.5 fL (ref 78.0–100.0)
Monocytes Absolute: 0.8 10*3/uL (ref 0.1–1.0)
Monocytes Relative: 7 %
NEUTROS ABS: 7.5 10*3/uL (ref 1.7–7.7)
NEUTROS PCT: 68 %
Platelets: 277 10*3/uL (ref 150–400)
RBC: 4.97 MIL/uL (ref 3.87–5.11)
RDW: 13.3 % (ref 11.5–15.5)
WBC: 11.1 10*3/uL — AB (ref 4.0–10.5)

## 2015-11-03 LAB — I-STAT TROPONIN, ED: TROPONIN I, POC: 0.01 ng/mL (ref 0.00–0.08)

## 2015-11-03 LAB — PROTIME-INR
INR: 1.14 (ref 0.00–1.49)
Prothrombin Time: 14.8 seconds (ref 11.6–15.2)

## 2015-11-03 LAB — MRSA PCR SCREENING: MRSA by PCR: POSITIVE — AB

## 2015-11-03 LAB — TSH: TSH: 0.984 u[IU]/mL (ref 0.350–4.500)

## 2015-11-03 LAB — MAGNESIUM: Magnesium: 1.5 mg/dL — ABNORMAL LOW (ref 1.7–2.4)

## 2015-11-03 MED ORDER — LEVOFLOXACIN IN D5W 750 MG/150ML IV SOLN
750.0000 mg | INTRAVENOUS | Status: DC
  Administered 2015-11-03: 750 mg via INTRAVENOUS
  Filled 2015-11-03: qty 150

## 2015-11-03 MED ORDER — ENOXAPARIN SODIUM 30 MG/0.3ML ~~LOC~~ SOLN
30.0000 mg | SUBCUTANEOUS | Status: DC
  Administered 2015-11-04: 30 mg via SUBCUTANEOUS
  Filled 2015-11-03: qty 0.3

## 2015-11-03 MED ORDER — IPRATROPIUM-ALBUTEROL 0.5-2.5 (3) MG/3ML IN SOLN
3.0000 mL | Freq: Once | RESPIRATORY_TRACT | Status: AC
  Administered 2015-11-03: 3 mL via RESPIRATORY_TRACT
  Filled 2015-11-03: qty 3

## 2015-11-03 MED ORDER — ENOXAPARIN SODIUM 40 MG/0.4ML ~~LOC~~ SOLN
40.0000 mg | SUBCUTANEOUS | Status: DC
  Administered 2015-11-03: 40 mg via SUBCUTANEOUS
  Filled 2015-11-03: qty 0.4

## 2015-11-03 MED ORDER — PREDNISONE 20 MG PO TABS
60.0000 mg | ORAL_TABLET | Freq: Once | ORAL | Status: AC
  Administered 2015-11-03: 60 mg via ORAL
  Filled 2015-11-03: qty 3

## 2015-11-03 MED ORDER — MAGNESIUM SULFATE 50 % IJ SOLN: 2.0000 g | Freq: Once | INTRAMUSCULAR | Status: DC

## 2015-11-03 MED ORDER — DILTIAZEM HCL 25 MG/5ML IV SOLN
10.0000 mg | Freq: Once | INTRAVENOUS | Status: AC
  Administered 2015-11-03: 10 mg via INTRAVENOUS
  Filled 2015-11-03: qty 5

## 2015-11-03 MED ORDER — SODIUM CHLORIDE 0.9 % IV SOLN
INTRAVENOUS | Status: AC
  Administered 2015-11-03: 16:00:00 via INTRAVENOUS

## 2015-11-03 MED ORDER — DILTIAZEM HCL 30 MG PO TABS
30.0000 mg | ORAL_TABLET | Freq: Four times a day (QID) | ORAL | Status: DC
  Administered 2015-11-03 (×2): 30 mg via ORAL
  Filled 2015-11-03 (×2): qty 1

## 2015-11-03 MED ORDER — SODIUM CHLORIDE 0.9 % IV BOLUS (SEPSIS)
1000.0000 mL | Freq: Once | INTRAVENOUS | Status: AC
  Administered 2015-11-03: 1000 mL via INTRAVENOUS

## 2015-11-03 MED ORDER — POTASSIUM CHLORIDE CRYS ER 20 MEQ PO TBCR
40.0000 meq | EXTENDED_RELEASE_TABLET | Freq: Once | ORAL | Status: AC
  Administered 2015-11-03: 40 meq via ORAL
  Filled 2015-11-03: qty 2

## 2015-11-03 MED ORDER — LEVOFLOXACIN 500 MG PO TABS
500.0000 mg | ORAL_TABLET | Freq: Every day | ORAL | Status: DC
  Administered 2015-11-04 – 2015-11-08 (×5): 500 mg via ORAL
  Filled 2015-11-03 (×6): qty 1

## 2015-11-03 MED ORDER — PROMETHAZINE HCL 25 MG PO TABS
12.5000 mg | ORAL_TABLET | Freq: Four times a day (QID) | ORAL | Status: DC | PRN
  Administered 2015-11-04: 12.5 mg via ORAL
  Filled 2015-11-03: qty 1

## 2015-11-03 MED ORDER — MAGNESIUM SULFATE 2 GM/50ML IV SOLN
2.0000 g | Freq: Once | INTRAVENOUS | Status: AC
  Administered 2015-11-03: 2 g via INTRAVENOUS
  Filled 2015-11-03: qty 50

## 2015-11-03 NOTE — ED Notes (Signed)
Patient states she woke up this morning to check her BP and her HR was 150. Patient states she called the hospital and advised to come in. Patient states she states she also has had a productive cough x2 weeks. . Patient states septum noted white with yellow tinged. MD aware. MD at bedside. Patient alert and oriented. Able to move all extermities. Patient HR during assessment 154.

## 2015-11-03 NOTE — Progress Notes (Signed)
Paged MD to clarify PO Cardizem order. Notify MD of HR greater than 120/ Patient HR up to 170's. HR maintaining 160. Notify MD of HR greater than 160. MD notified. Will continue to monitor.

## 2015-11-03 NOTE — H&P (Signed)
Date: 11/03/2015               Patient Name:  Sheila Branch MRN: [id number]  DOB: 1971/05/28 Age / Sex: 45 y.o., female   PCP: No primary care provider on file.         Medical Service: Internal Medicine Teaching Service         Attending Physician: Dr. Earl Lagos, MD    First Contact: Dr. Valentino Nose Pager: [phone]  Second Contact: Dr. Heywood Iles Pager: [phone]       After Hours (After 5p/  First Contact Pager: [phone]  weekends / holidays): Second Contact Pager: [phone]   Chief Complaint: Palpitation   History of Present Illness: [name] is a 45 year old female with a history of percutaneous procedures in childhood for "a hole in her heart," tobacco abuse and drug abuse presents today with acute onset palpitations. She reports that this morning she was sitting at her desk and suddenly felt like her heart was beating fast with palpitations and a burning chest pain that radiated to her left shoulder. She checked her pulse at that time and it was ~150 bpm. She smoke marijuana to try to slow her heart rate with no improvement so she came to the ED. On arrival, she was found to have a heart rate in the 170s with EKG demonstrating A. Fib with RVR. Subsequent EKGs have demonstrated A. Flutter. Does note that she has experienced palpitations 1-2 times a year all of her life.   She reports that for the past 2-3 weeks she has been having dyspnea with exertion, orthopnea and PND. She was seen in the ED on 2/4 with complaints of bilateral lower extremity swelling. She was started on HCTZ 25 mg daily at that time. Leg edema has improved since then. She also reports a cough with green sputum production during that time. Reports subjective fevers but denies any chills. She has a 23 pack year history and recently quit smoking 2 months ago. Denies any history of underlying lung disease. Reports using her friends inhaler (likely albuterol but unsure) with some improvement in symptoms.  Denies any nausea, vomiting, diarrhea, sinus pressure, rhinorrhea, sore throat, urinary symptoms. Denies any sick contacts. Works as a Conservation officer, nature in [employer].   Patient does admit to [facility] use and a history of cocaine use (none for 10 years). Denies any history of IV drug use.   Reports history of cancer in her grandfather but unsure what type. Unable to recall any other family history.   Meds: Current Facility-Administered Medications  Medication Dose Route Frequency Provider Last Rate Last Dose  . 0.9 %  sodium chloride infusion   Intravenous Continuous Rushil Terrilee Croak, MD 100 mL/hr at 11/03/15 1615    . diltiazem (CARDIZEM) tablet 30 mg  30 mg Oral 4 times per day Heywood Iles V, MD      . enoxaparin (LOVENOX) injection 40 mg  40 mg Subcutaneous Q24H Rushil Patel V, MD      . levofloxacin (LEVAQUIN) IVPB 750 mg  750 mg Intravenous Q24H Courteney Lyn Mackuen, MD   Stopped at 11/03/15 1541  . promethazine (PHENERGAN) tablet 12.5 mg  12.5 mg Oral Q6H PRN Rushil Terrilee Croak, MD       Current Outpatient Prescriptions  Medication Sig Dispense Refill  . aspirin 81 MG tablet Take 81 mg by mouth daily as needed for pain.    . hydrochlorothiazide (HYDRODIURIL) 25 MG tablet Take 1 tablet (  25 mg total) by mouth daily. 30 tablet 0  . ibuprofen (ADVIL,MOTRIN) 200 MG tablet Take 400 mg by mouth every 6 (six) hours as needed for moderate pain. For pain      Allergies: Allergies as of 11/03/2015 - Review Complete 11/03/2015  Allergen Reaction Noted  . Penicillins cross reactors Swelling and Other (See Comments) 07/24/2011   Past Medical History  Diagnosis Date  . Mitral and aortic heart valve diseases, unspecified    Past Surgical History  Procedure Laterality Date  . Cardiac surgery    . Cesarean section    . Rectal surgery    . Tubal ligation     No family history on file. Social History   Social History  . Marital Status: Single    Spouse Name: N/A  . Number of Children: N/A  . Years  of Education: N/A   Occupational History  . Not on file.   Social History Main Topics  . Smoking status: Former Smoker -- 1.00 packs/day for 23 years    Types: Cigarettes    Quit date: 09/12/2015  . Smokeless tobacco: Not on file  . Alcohol Use: No     Comment: occasionally  . Drug Use: Yes    Special: Marijuana     Comment: occasionally  . Sexual Activity: Not on file   Other Topics Concern  . Not on file   Social History Narrative   Review of Systems: Negative except as noted in the HPI  Physical Exam: Blood pressure 97/76, pulse 139, temperature 98.1 F (36.7 C), resp. rate 26, last menstrual period 10/15/2015, SpO2 96 %. General: alert, well-developed, and cooperative to examination.  Head: normocephalic and atraumatic.  Eyes: vision grossly intact, pupils equal, pupils round, pupils reactive to light, no injection and anicteric.  Mouth: pharynx pink and moist, no erythema, and no exudates.  Neck: supple, full ROM, no thyromegaly, no JVD, and no carotid bruits.  Lungs: normal respiratory effort, no accessory muscle use, coarse rhonchi bilaterally, no wheezing Heart: Tachycardic, regular rhythm, systolic murmur, no gallop, and no rub.  Abdomen: soft, non-tender, normal bowel sounds, no distention, no guarding Msk: no joint swelling, no joint warmth, and no redness over joints.  Pulses: 2+ DP/PT pulses bilaterally Extremities: No cyanosis, clubbing, edema Neurologic: alert & oriented X3, cranial nerves II-XII intact, no focal deficits Skin: turgor normal and no rashes.  Psych: Normal mood. Pressured speech. Anxious.  Lab results: Basic Metabolic Panel:  Recent Labs  07/27/34 1207  NA 139  K 3.4*  CL 96*  CO2 28  GLUCOSE 164*  BUN 19  CREATININE 0.95  CALCIUM 9.2   Liver Function Tests:  Recent Labs  11/03/15 1207  AST 43*  ALT 27  ALKPHOS 118  BILITOT 1.7*  PROT 7.7  ALBUMIN 3.5   CBC:  Recent Labs  11/03/15 1207  WBC 11.1*  NEUTROABS 7.5    HGB 15.4*  HCT 45.5  MCV 91.5  PLT 277   Coagulation:  Recent Labs  11/03/15 1207  LABPROT 14.8  INR 1.14   Urine Drug Screen: Drugs of Abuse  No results found for: LABOPIA, COCAINSCRNUR, LABBENZ, AMPHETMU, THCU, LABBARB  Alcohol Level: No results for input(s): ETH in the last 72 hours. Urinalysis: No results for input(s): COLORURINE, LABSPEC, PHURINE, GLUCOSEU, HGBUR, BILIRUBINUR, KETONESUR, PROTEINUR, UROBILINOGEN, NITRITE, LEUKOCYTESUR in the last 72 hours.  Invalid input(s): APPERANCEUR  Imaging results:  Dg Chest 2 View  11/03/2015  CLINICAL DATA:  Productive cough and recurring fever  for 2 weeks. Patient has a history of mitral and aortic valvular disease. EXAM: CHEST  2 VIEW COMPARISON:  November 03, 2015, July 25, 2011 FINDINGS: The heart size and mediastinal contours are stable. The heart size is enlarged. Enlarged central pulmonary vessel caliber are noted unchanged. Previously noted patchy consolidation of the medial right infrahilar region is unchanged. There is no pleural effusion. The visualized skeletal structures are stable. There is scoliosis of spine. IMPRESSION: Congestive heart failure. Patchy consolidation of the medial right infrahilar region, superimposed pneumonia is not excluded. This not significant changed from earlier exam today. Electronically Signed   By: Sherian Rein M.D.   On: 11/03/2015 16:02   Dg Chest Port 1 View  11/03/2015  CLINICAL DATA:  Shortness of breath, cough for 1 week EXAM: PORTABLE CHEST 1 VIEW COMPARISON:  07/25/2011 FINDINGS: There is significant cardiomegaly with progression from prior exam. No convincing pulmonary edema. Mild right hilar prominence. Although may be vascular in nature adenopathy cannot be excluded. There is hazy infiltrate or infiltrative process in right infrahilar region. Although this may be due to pneumonia neoplastic process cannot be excluded. Follow-up to resolution is recommended. IMPRESSION: Significant  cardiomegaly with progression from prior exam.Mild right hilar prominence. Although may be vascular in nature adenopathy cannot be excluded. There is hazy infiltrate or infiltrative process in right infrahilar region. Although this may be due to pneumonia neoplastic process cannot be excluded. Follow-up to resolution is recommended. Electronically Signed   By: Natasha Mead M.D.   On: 11/03/2015 12:41    Other results: EKG: A. Flutter rate 158  Assessment & Plan by Problem:  Community Acquired Pneumonia: Patient with 2-3 week history of cough productive of green sputum. Reports subjective fevers. No chills. Portable CXR with hazy infiltrate in right infrahilar region suspicious for PNA. Exam notable for diffuse coarse rhonchi but no wheezing. Afebrile but leukocytosis present. Satting 98-100% on room air.  Patient with 23 pack year history but denies any history of asthma or COPD. - Repeat CXR - BMP/CBC in am - Mg level - Orthostatic vitals - Levaquin  - UDS - NS 100 mL/hr for 12 hours  - Phenergan 12.5 mg prn - Supplemental O2 keep O2 sats > 92%  A. Flutter: She was noted to be tachycardic to the 170s on arrival with EKG demonstrating A. Fib with RVR. Subsequent EKG after receiving diltiazem is showing A. Flutter with 2:1 AV block. Patient with complaints of palpitations. Reports some burning chest pain that has resolved. Troponin negative x 1. No signs of acute ischemia on EKG. Patient reports 2-3 week history of dyspnea with exertion, orthopnea and PND. Was seen in the ED 2 weeks ago with bilateral LE edema. No LE edema present today. No signs of volume overload. Patient with unclear history of congential heart disease with percutaneous interventions as a child (reports hole in her heart to me, reported valvular disease to ED). Likely 2/2 underlying CAP.  - Diltiazem 30 mg q6hr  - Telemetry - Repeat EKG in am - ECHO - TSH  Hypokalemia: K 3.4 on admission. KDur 40 mEq.   DVT PPx:  Lovenox  Dispo: Disposition is deferred at this time, awaiting improvement of current medical problems.  The patient does not have a current PCP (No primary care provider on file.) and does need an [facility] hospital follow-up appointment after discharge.  The patient does not have transportation limitations that hinder transportation to clinic appointments.  Signed: Valentino Nose, MD 11/03/2015, 6:00 PM

## 2015-11-03 NOTE — ED Notes (Signed)
EKG done. HR noted to be 170-200 bpm.  Charge RN notified and pt taken back to trauma A.  Staff with pt now.

## 2015-11-03 NOTE — Progress Notes (Signed)
Pharmacy Antibiotic Note Sheila Branch is a 45 y.o. female admitted on 11/03/2015 with pneumonia (cap).  Pharmacy has been consulted for Levaquin dosing.  Plan: 1. Begin Levaquin 750 mg IV q 24 hours     Temp (24hrs), Avg:98.1 F (36.7 C), Min:98.1 F (36.7 C), Max:98.1 F (36.7 C)   Recent Labs Lab 11/03/15 1207  WBC 11.1*  CREATININE 0.95    CrCl cannot be calculated (Unknown ideal weight.).    Allergies  Allergen Reactions  . Penicillins Cross Reactors Swelling and Other (See Comments)    Mouth Ulcers Has patient had a PCN reaction causing immediate rash, facial/tongue/throat swelling, SOB or lightheadedness with hypotension: Yes Has patient had a PCN reaction causing severe rash involving mucus membranes or skin necrosis: Yes Has patient had a PCN reaction that required hospitalization No Has patient had a PCN reaction occurring within the last 10 years: No If all of the above answers are "NO", then may proceed with Cephalosporin use.     Antimicrobials this admission: 2/23 Levaquin >>  Dose adjustments this admission: n/a  Microbiology results: px  Thank you for allowing pharmacy to be a part of this patient's care.  Sheron Nightingale 11/03/2015 1:34 PM

## 2015-11-03 NOTE — ED Provider Notes (Addendum)
CSN: [id number]     Arrival date & time 11/03/15  1139 History   First MD Initiated Contact with Patient 11/03/15 1207     Chief Complaint  Patient presents with  . Tachycardia     (Consider location/radiation/quality/duration/timing/severity/associated sxs/prior Treatment) HPI   Patient is a 45 year old female with past medical history significant for mitral and aortic heart valve disease (reported) , recently quitting smoking. She reports that since couldn't smoking she's had increased edema. She was started on hydrochlorothiazide. She's been taking this at home. She reports the last week or so she's been having increasing cough feeling weak and difficulty with shortness of breath with exertion. She's been having increasing sputum production as well.  On arrival here patient found to have a heart rate in the 170s, A. fib with RVR.  Past Medical History  Diagnosis Date  . Mitral and aortic heart valve diseases, unspecified    Past Surgical History  Procedure Laterality Date  . Cardiac surgery    . Cesarean section    . Rectal surgery    . Tubal ligation     No family history on file. Social History  Substance Use Topics  . Smoking status: Current Every Day Smoker  . Smokeless tobacco: None  . Alcohol Use: Yes     Comment: occasionally   OB History    No data available     Review of Systems  Constitutional: Positive for fatigue. Negative for activity change.  HENT: Positive for congestion.   Eyes: Negative for discharge.  Respiratory: Positive for cough and chest tightness.   Cardiovascular: Negative for chest pain.  Gastrointestinal: Negative for abdominal distention.  Genitourinary: Negative for dysuria and difficulty urinating.  Skin: Negative for rash.  Allergic/Immunologic: Negative for immunocompromised state.  Neurological: Negative for seizures and speech difficulty.  Psychiatric/Behavioral: Negative for behavioral problems and agitation.      Allergies   Penicillins cross reactors  Home Medications   Prior to Admission medications   Medication Sig Start Date End Date Taking? Authorizing Provider  hydrochlorothiazide (HYDRODIURIL) 25 MG tablet Take 1 tablet (25 mg total) by mouth daily. 10/15/15   Rolland Porter, MD  ibuprofen (ADVIL,MOTRIN) 200 MG tablet Take 400 mg by mouth every 6 (six) hours as needed for moderate pain. For pain    Historical Provider, MD   BP 106/59 mmHg  Pulse 144  Temp(Src) 98.1 F (36.7 C)  Resp 20  SpO2 98%  LMP 10/15/2015 Physical Exam  Constitutional: She is oriented to person, place, and time. She appears well-developed and well-nourished.  HENT:  Head: Normocephalic and atraumatic.  Eyes: Conjunctivae are normal. Right eye exhibits no discharge.  Neck: Neck supple.  Cardiovascular: Normal heart sounds.   No murmur heard. Tachycardic, irregular.  Pulmonary/Chest: She has wheezes. She has no rales.  Wheezes and rhonchi bilaterally.  Abdominal: Soft. She exhibits no distension. There is no tenderness.  Musculoskeletal: Normal range of motion. She exhibits edema.  Neurological: She is oriented to person, place, and time. No cranial nerve deficit.  Skin: Skin is warm and dry. No rash noted. She is not diaphoretic.  Psychiatric: She has a normal mood and affect. Her behavior is normal.  Nursing note and vitals reviewed.   ED Course  Procedures (including critical care time) Labs Review Labs Reviewed  COMPREHENSIVE METABOLIC PANEL - Abnormal; Notable for the following:    Potassium 3.4 (*)    Chloride 96 (*)    Glucose, Bld 164 (*)    AST  43 (*)    Total Bilirubin 1.7 (*)    All other components within normal limits  CBC WITH DIFFERENTIAL/PLATELET - Abnormal; Notable for the following:    WBC 11.1 (*)    Hemoglobin 15.4 (*)    All other components within normal limits  PROTIME-INR  I-STAT TROPOININ, ED  POC URINE PREG, ED    Imaging Review Dg Chest Port 1 View  11/03/2015  CLINICAL DATA:   Shortness of breath, cough for 1 week EXAM: PORTABLE CHEST 1 VIEW COMPARISON:  07/25/2011 FINDINGS: There is significant cardiomegaly with progression from prior exam. No convincing pulmonary edema. Mild right hilar prominence. Although may be vascular in nature adenopathy cannot be excluded. There is hazy infiltrate or infiltrative process in right infrahilar region. Although this may be due to pneumonia neoplastic process cannot be excluded. Follow-up to resolution is recommended. IMPRESSION: Significant cardiomegaly with progression from prior exam.Mild right hilar prominence. Although may be vascular in nature adenopathy cannot be excluded. There is hazy infiltrate or infiltrative process in right infrahilar region. Although this may be due to pneumonia neoplastic process cannot be excluded. Follow-up to resolution is recommended. Electronically Signed   By: Natasha Mead M.D.   On: 11/03/2015 12:41   I have personally reviewed and evaluated these images and lab results as part of my medical decision-making.   EKG Interpretation   Date/Time:  Thursday November 03 2015 12:03:18 EST Ventricular Rate:  158 PR Interval:    QRS Duration: 85 QT Interval:  319 QTC Calculation: 517 R Axis:   93 Text Interpretation:  Atrial flutter Borderline right axis deviation RSR'  in V1 or V2, probably normal variant LVH with secondary repolarization  abnormality Prolonged QT interval Atrial fibrillation with rvr Confirmed  by Kandis Mannan ([id number]) on 11/03/2015 12:19:01 PM      MDM   Final diagnoses:  Cough   She is a pleasant 45 year old female presenting to emergency Department today with 1 week of increasing shortness of breath, cough, malaise. Patient's on arrival was found to be in A. fib with RVR. Patient's heart rate decreased to the 120s after being roomed and after the flurry of activity.  Patient denies any chest pain. Concern for this new A. fib may be due to pneumonia. Patient's physical exam  consistent with wheezes and rhonchi and increased cough and congestion. Differential includes  COPD exacerbation versus reactive airway disease versus pneumonia.   1:37 PM Patient x-ray shows questionable pneumonia. We'll treat with antibiotics. We'll also give prednisone given level of wheezing concern for reactive airway disease on top of CAP.  Will admit for new onset A. fib in the setting of community acquired pneumonia.  Leib Elahi Lyn Corlis Leak, MD 11/03/15 1339  2:02 PM Now patinet's EKG appears be flutter with block, still tachycardic.  Will give a second dose of dilt.   Rhonin Trott Randall An, MD 11/03/15 1402

## 2015-11-04 ENCOUNTER — Encounter (HOSPITAL_COMMUNITY): Payer: Self-pay | Admitting: Physician Assistant

## 2015-11-04 ENCOUNTER — Ambulatory Visit (HOSPITAL_COMMUNITY): Payer: MEDICAID

## 2015-11-04 DIAGNOSIS — I480 Paroxysmal atrial fibrillation: Secondary | ICD-10-CM

## 2015-11-04 DIAGNOSIS — J189 Pneumonia, unspecified organism: Principal | ICD-10-CM

## 2015-11-04 DIAGNOSIS — Q249 Congenital malformation of heart, unspecified: Secondary | ICD-10-CM

## 2015-11-04 DIAGNOSIS — I4892 Unspecified atrial flutter: Secondary | ICD-10-CM

## 2015-11-04 DIAGNOSIS — R011 Cardiac murmur, unspecified: Secondary | ICD-10-CM

## 2015-11-04 LAB — RAPID URINE DRUG SCREEN, HOSP PERFORMED
Amphetamines: NOT DETECTED
BARBITURATES: NOT DETECTED
Benzodiazepines: NOT DETECTED
Cocaine: NOT DETECTED
Opiates: POSITIVE — AB
Tetrahydrocannabinol: POSITIVE — AB

## 2015-11-04 LAB — CBC
HEMATOCRIT: 37.9 % (ref 36.0–46.0)
Hemoglobin: 13.2 g/dL (ref 12.0–15.0)
MCH: 31.7 pg (ref 26.0–34.0)
MCHC: 34.8 g/dL (ref 30.0–36.0)
MCV: 91.1 fL (ref 78.0–100.0)
PLATELETS: 253 10*3/uL (ref 150–400)
RBC: 4.16 MIL/uL (ref 3.87–5.11)
RDW: 13.4 % (ref 11.5–15.5)
WBC: 9.1 10*3/uL (ref 4.0–10.5)

## 2015-11-04 LAB — BASIC METABOLIC PANEL
ANION GAP: 11 (ref 5–15)
BUN: 15 mg/dL (ref 6–20)
CALCIUM: 8.4 mg/dL — AB (ref 8.9–10.3)
CO2: 23 mmol/L (ref 22–32)
Chloride: 100 mmol/L — ABNORMAL LOW (ref 101–111)
Creatinine, Ser: 0.6 mg/dL (ref 0.44–1.00)
GFR calc Af Amer: >60 mL/min (ref >60)
Glucose, Bld: 132 mg/dL — ABNORMAL HIGH (ref 65–99)
POTASSIUM: 3.9 mmol/L (ref 3.5–5.1)
SODIUM: 134 mmol/L — AB (ref 135–145)

## 2015-11-04 LAB — MAGNESIUM: Magnesium: 2.5 mg/dL — ABNORMAL HIGH (ref 1.7–2.4)

## 2015-11-04 LAB — HIV ANTIBODY (ROUTINE TESTING W REFLEX): HIV SCREEN 4TH GENERATION: NONREACTIVE

## 2015-11-04 MED ORDER — DILTIAZEM HCL 100 MG IV SOLR
5.0000 mg/h | INTRAVENOUS | Status: DC
  Administered 2015-11-04: 7.5 mg/h via INTRAVENOUS
  Administered 2015-11-04: 10 mg/h via INTRAVENOUS
  Administered 2015-11-04: 15 mg/h via INTRAVENOUS
  Administered 2015-11-04: 5 mg/h via INTRAVENOUS
  Administered 2015-11-05: 7.5 mg/h via INTRAVENOUS
  Filled 2015-11-04 (×4): qty 100

## 2015-11-04 MED ORDER — HEPARIN BOLUS VIA INFUSION
2000.0000 [IU] | Freq: Once | INTRAVENOUS | Status: AC
  Administered 2015-11-04: 2000 [IU] via INTRAVENOUS
  Filled 2015-11-04: qty 2000

## 2015-11-04 MED ORDER — METOPROLOL TARTRATE 25 MG PO TABS
25.0000 mg | ORAL_TABLET | Freq: Four times a day (QID) | ORAL | Status: DC
  Administered 2015-11-04 – 2015-11-06 (×6): 25 mg via ORAL
  Filled 2015-11-04 (×7): qty 1

## 2015-11-04 MED ORDER — HEPARIN (PORCINE) IN NACL 100-0.45 UNIT/ML-% IJ SOLN
650.0000 [IU]/h | INTRAMUSCULAR | Status: DC
Start: 2015-11-04 — End: 2015-11-06
  Administered 2015-11-04 – 2015-11-06 (×2): 650 [IU]/h via INTRAVENOUS
  Filled 2015-11-04 (×2): qty 250

## 2015-11-04 MED ORDER — CHLORHEXIDINE GLUCONATE CLOTH 2 % EX PADS
6.0000 | MEDICATED_PAD | Freq: Every day | CUTANEOUS | Status: AC
Start: 2015-11-04 — End: 2015-11-08
  Administered 2015-11-04 – 2015-11-08 (×5): 6 via TOPICAL

## 2015-11-04 MED ORDER — MUPIROCIN 2 % EX OINT
1.0000 "application " | TOPICAL_OINTMENT | Freq: Two times a day (BID) | CUTANEOUS | Status: AC
  Administered 2015-11-04 – 2015-11-08 (×10): 1 via NASAL
  Filled 2015-11-04 (×2): qty 22

## 2015-11-04 MED ORDER — FUROSEMIDE 10 MG/ML IJ SOLN
INTRAMUSCULAR | Status: AC
  Administered 2015-11-04: 40 mg via INTRAVENOUS
  Filled 2015-11-04: qty 4

## 2015-11-04 MED ORDER — FUROSEMIDE 10 MG/ML IJ SOLN
40.0000 mg | Freq: Once | INTRAMUSCULAR | Status: AC
  Administered 2015-11-04: 40 mg via INTRAVENOUS

## 2015-11-04 MED ORDER — ASPIRIN 81 MG PO CHEW
81.0000 mg | CHEWABLE_TABLET | Freq: Every day | ORAL | Status: DC
  Administered 2015-11-04 – 2015-11-06 (×3): 81 mg via ORAL
  Filled 2015-11-04 (×3): qty 1

## 2015-11-04 MED ORDER — POTASSIUM CHLORIDE CRYS ER 20 MEQ PO TBCR
40.0000 meq | EXTENDED_RELEASE_TABLET | Freq: Once | ORAL | Status: AC
  Administered 2015-11-04: 40 meq via ORAL
  Filled 2015-11-04: qty 2

## 2015-11-04 MED ORDER — INFLUENZA VAC SPLIT QUAD 0.5 ML IM SUSY
0.5000 mL | PREFILLED_SYRINGE | INTRAMUSCULAR | Status: AC
  Administered 2015-11-10: 0.5 mL via INTRAMUSCULAR

## 2015-11-04 NOTE — Progress Notes (Signed)
Utilization review completed. Kanai Hilger, RN, BSN. 

## 2015-11-04 NOTE — Consult Note (Signed)
CARDIOLOGY CONSULT NOTE   Patient ID: Shylin M Awwad MRN: [id number], DOBKYLIYAH STIRN 1972   Admit date: 11/03/2015 Date of Consult: 11/04/2015   Primary Physician: No primary care provider on file. Primary Cardiologist: new   Pt. Profile  Mrs. Beaumont is a 45 year old female with past medical history of congenital heart disease who has not seen a PCP for the past 18 years who came in with pneumonia and also noted to be atrial flutter with RVR. She does have intermittent cardiac awareness with her heart was fast, however we do not know the actual onset of atrial flutter. She also had noted to have a loud murmur on physical exam. Cardiology has been asked to manage atrial flutter   Problem List  Past Medical History  Diagnosis Date  . Mitral and aortic heart valve diseases, unspecified     Past Surgical History  Procedure Laterality Date  . Cardiac surgery    . Cesarean section    . Rectal surgery    . Tubal ligation       Allergies  Allergies  Allergen Reactions  . Penicillins Cross Reactors Swelling and Other (See Comments)    Mouth Ulcers Has patient had a PCN reaction causing immediate rash, facial/tongue/throat swelling, SOB or lightheadedness with hypotension: Yes Has patient had a PCN reaction causing severe rash involving mucus membranes or skin necrosis: Yes Has patient had a PCN reaction that required hospitalization No Has patient had a PCN reaction occurring within the last 10 years: No If all of the above answers are "NO", then may proceed with Cephalosporin use.     HPI   Mrs. Knoke is a 45 year old female with past medical history of congenital heart disease who has not seen a PCP for the past 18 years. 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 She had a cardiac catheterization before her 1 year  birthday. She gave birth to her daughter 18 years ago without difficulty. That was the last time she has seen internal medicine doctor. She states her mother who used to be a nurse has been taking care of her until she passed away.   She has a long-standing history of palpitations. She works as a Conservation officer, nature at [employer], occasionally she would notice her heart become irregular and beat at a fast pace. 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 She still does marijuana occasionally. She finally decided to quit smoking around 09/12/2015, however despite quitting smoking, she continued to have dyspnea especially noticeable on exertion. Shortly after, she had bilateral lower extremity swelling that started on the left side however eventually involved the right side as well. She was seen at [facility] ED, and was given hydrochlorothiazide as needed. Swelling has since improved. Later she started having productive cough with yellow/green sputum. She was checking her blood pressure then noted to have HR in 160s. She eventually sought medical attention at ED again on 11/03/2015 and was admitted for pneumonia. On arrival, she was noted to be in tachycardia, after starting on IV diltiazem, it is noticed she is actually in atrial flutter with RVR. Dates she only noticed take cardiac episode if it runs fast, therefore we have no way to find out how long she has been in this tachycardic episodes. Other significant  lab finding include potassium 3.4. White blood cell count 11.1. Chest x-ray showed hazy infiltrate in the right infrahilar region likely related to pneumonia. Urine drug test is positive for opioids and marijuana. Cardiology has been consulted for atrial flutter.   Inpatient Medications  . aspirin  81 mg Oral Daily  . Chlorhexidine Gluconate Cloth  6 each Topical Q0600  . enoxaparin (LOVENOX) injection  30 mg Subcutaneous Q24H  . levofloxacin  500 mg Oral  Daily  . mupirocin ointment  1 application Nasal BID   . diltiazem (CARDIZEM) infusion 10 mg/hr (11/04/15 1520)   Family History Family History  Problem Relation Age of Onset  . Heart disease Mother     "i think she died of heart issue"     Social History Social History   Social History  . Marital Status: Single    Spouse Name: N/A  . Number of Children: N/A  . Years of Education: N/A   Occupational History  . Not on file.   Social History Main Topics  . Smoking status: Former Smoker -- 1.00 packs/day for 23 years    Types: Cigarettes    Quit date: 09/12/2015  . Smokeless tobacco: Not on file  . Alcohol Use: No     Comment: occasionally  . Drug Use: Yes    Special: Marijuana     Comment: occasionally  . Sexual Activity: Not on file   Other Topics Concern  . Not on file   Social History Narrative     Review of Systems  General:  No chills, fever, night sweats or weight changes.  Cardiovascular:  No chest pain, orthopnea, paroxysmal nocturnal dyspnea. +dyspnea on exertion, edema, palpitations Dermatological: No rash, lesions/masses Respiratory: No cough, dyspnea Urologic: No hematuria, dysuria Abdominal:   No nausea, vomiting, diarrhea, bright red blood per rectum, melena, or hematemesis Neurologic:  No visual changes, wkns, changes in mental status. All other systems reviewed and are otherwise negative except as noted above.  Physical Exam  Blood pressure 119/75, pulse 141, temperature 98.1 F (36.7 C), temperature source Oral, resp. rate 20, height 5\' 3"  (1.6 m), weight 98 lb 9.6 oz (44.725 kg), last menstrual period 10/15/2015, SpO2 97 %.  General: Pleasant, NAD Psych: Normal affect. Neuro: Alert and oriented X 3. Moves all extremities spontaneously. HEENT: Normal  Neck: Supple without bruits or JVD. Lungs:  Resp regular and unlabored. R basilar rhonchi. Heart: Irregular no s3, s4. 5/6 loud systolic murmur and thrill Abdomen: Soft, non-tender,  non-distended, BS + x 4.  Extremities: No clubbing, cyanosis or edema. DP/PT/Radials 2+ and equal bilaterally.  Labs  No results for input(s): CKTOTAL, CKMB, TROPONINI in the last 72 hours. Lab Results  Component Value Date   WBC 9.1 11/04/2015   HGB 13.2 11/04/2015   HCT 37.9 11/04/2015   MCV 91.1 11/04/2015   PLT 253 11/04/2015     Recent Labs Lab 11/03/15 1207 11/04/15 0500  NA 139 134*  K 3.4* 3.9  CL 96* 100*  CO2 28 23  BUN 19 15  CREATININE 0.95 0.60  CALCIUM 9.2 8.4*  PROT 7.7  --   BILITOT 1.7*  --   ALKPHOS 118  --   ALT 27  --   AST 43*  --   GLUCOSE 164* 132*    Radiology/Studies  Dg Chest 2 View  11/03/2015  CLINICAL DATA:  Productive cough and recurring fever for 2 weeks. Patient has a history of mitral and aortic valvular disease. EXAM: CHEST  2 VIEW COMPARISON:  November 03, 2015, July 25, 2011 FINDINGS: The heart size and mediastinal contours are stable. The heart size is enlarged. Enlarged central pulmonary vessel caliber are noted unchanged. Previously noted patchy consolidation of the medial right infrahilar region is unchanged. There is no pleural effusion. The visualized skeletal structures are stable. There is scoliosis of spine. IMPRESSION: Congestive heart failure. Patchy consolidation of the medial right infrahilar region, superimposed pneumonia is not excluded. This not significant changed from earlier exam today. Electronically Signed   By: Sherian Rein M.D.   On: 11/03/2015 16:02   Dg Chest Port 1 View  11/03/2015  CLINICAL DATA:  Shortness of breath, cough for 1 week EXAM: PORTABLE CHEST 1 VIEW COMPARISON:  07/25/2011 FINDINGS: There is significant cardiomegaly with progression from prior exam. No convincing pulmonary edema. Mild right hilar prominence. Although may be vascular in nature adenopathy cannot be excluded. There is hazy infiltrate or infiltrative process in right infrahilar region. Although this may be due to pneumonia neoplastic  process cannot be excluded. Follow-up to resolution is recommended. IMPRESSION: Significant cardiomegaly with progression from prior exam.Mild right hilar prominence. Although may be vascular in nature adenopathy cannot be excluded. There is hazy infiltrate or infiltrative process in right infrahilar region. Although this may be due to pneumonia neoplastic process cannot be excluded. Follow-up to resolution is recommended. Electronically Signed   By: Natasha Mead M.D.   On: 11/03/2015 12:41    ECG: Aflutter with RVR  ASSESSMENT AND PLAN  1. Paroxysmal atrial flutter with RVR  - continue rate control, increase diltiazem as tolerated. Likely cause is structural heart disease and PNA, although CHA2DS2-Vasc score 1 (female), chance of recurrence is very high. MD to decide if systemic anticoagulation is indicated given high recurrence  2. Loud 5/6 systolic murmur likely represent congenital heart disease   - pending echocardiogram to assess loud murmur, likely ASD vs VSD  3. CAP on levofloxacin  Signed, Azalee Course, PA-C 11/04/2015, 3:00 PM   The patient was seen, examined and discussed with Azalee Course, PA-C and I agree with the above.   Mrs. Counihan is a 45 year old female with past medical history of congenital heart disease who has not seen a PCP for the past 18 years. According to the patient, she was born in [place], when she was born she was noted to have a "hole in the heart". 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 She had a cardiac catheterization before her 2 year birthday. She gave birth to her daughter 18 years ago without difficulty. That was the last time she has seen internal medicine doctor. She states her mother who used to be a nurse has been taking care of her until she passed away. She has just stopped smoking and has h/o cocaine use, quit 10 years ago. She has noticed worsening DOE in the last few months, occassional PND, some LE edema. She was  given HCTZ with some improvement of edema, but no improvement in DOE. Developed productive sputum and is being treated for pneumonia.  The patient is a poor historian and states that she has had palpitations her whole life. There is an ECG in epic from 10/16/2015 that shows ectopic atrial rhythm, LVH, ? RVH, iRBBB. She now presented with a-fib with RVR, currently in atrial flutter with variable block.  Physical exam reveals elevated JVDs up to the jaws, loud holosystolic murmur, accentuated P2, crackles at the lung bases.  We will obtain  to assess the anatomy and hemodynamic.   I would continue cardizem drip and start full anticoagulation considering she will need a cardioversion. We will start heparin drip for now as we don't know her valvular anatomy and will decide warfarin vs NOAC post TTE.  Lars Masson 11/04/2015

## 2015-11-04 NOTE — Progress Notes (Signed)
ANTICOAGULATION CONSULT NOTE - Initial Consult  Pharmacy Consult for heparin Indication: atrial fibrillation  Allergies  Allergen Reactions  . Penicillins Cross Reactors Swelling and Other (See Comments)    Mouth Ulcers Has patient had a PCN reaction causing immediate rash, facial/tongue/throat swelling, SOB or lightheadedness with hypotension: Yes Has patient had a PCN reaction causing severe rash involving mucus membranes or skin necrosis: Yes Has patient had a PCN reaction that required hospitalization No Has patient had a PCN reaction occurring within the last 10 years: No If all of the above answers are "NO", then may proceed with Cephalosporin use.     Patient Measurements: Height: 5\' 3"  (160 cm) Weight: 98 lb 9.6 oz (44.725 kg) IBW/kg (Calculated) : 52.4 Heparin Dosing Weight: 43.8  Vital Signs: Temp: 98.5 F (36.9 C) (02/24 1541) Temp Source: Oral (02/24 1541) BP: 112/40 mmHg (02/24 1541) Pulse Rate: 122 (02/24 1541)  Labs:  Recent Labs  11/03/15 1207 11/04/15 0500  HGB 15.4* 13.2  HCT 45.5 37.9  PLT 277 253  LABPROT 14.8  --   INR 1.14  --   CREATININE 0.95 0.60    Estimated Creatinine Clearance: 64 mL/min (by C-G formula based on Cr of 0.6).   Medical History: Past Medical History  Diagnosis Date  . Mitral and aortic heart valve diseases, unspecified     Medications:  Prescriptions prior to admission  Medication Sig Dispense Refill Last Dose  . aspirin 81 MG tablet Take 81 mg by mouth daily as needed for pain.   11/02/2015 at Unknown time  . hydrochlorothiazide (HYDRODIURIL) 25 MG tablet Take 1 tablet (25 mg total) by mouth daily. 30 tablet 0 11/03/2015 at Unknown time  . ibuprofen (ADVIL,MOTRIN) 200 MG tablet Take 400 mg by mouth every 6 (six) hours as needed for moderate pain. For pain   Past Month at Unknown time    Assessment: 45 yo F to start heparin per pharmacy for afib.  She was given LMWH 30 mg at 1710 today.  CBC WNL, Creat WNL, wt 44.7  kg.  Goal of Therapy:  Heparin level 0.3-0.7 units/ml Monitor platelets by anticoagulation protocol: Yes   Plan: dc LMWH 30 q24 Give 2000 units bolus x 1 Start heparin infusion at 650 units/hr Check anti-Xa level in 6 hours and daily while on heparin Continue to monitor H&H and platelets  Herby Abraham, Pharm.D. [phone] 11/04/2015 5:22 PM

## 2015-11-04 NOTE — Progress Notes (Signed)
   Subjective: Ms. Steinhaus reports doing well this morning. She says that she can feel palpations and a burning chest pain when she is talking but her symptoms resolve when she is resting quietly. No shortness of breath, dizziness or lightheadedness. Denies any further cough. No fevers or chills.   Objective: Vital signs in last 24 hours: Filed Vitals:   11/04/15 0850 11/04/15 0851 11/04/15 0852 11/04/15 1110  BP:    115/73  Pulse: 105 133 133 110  Temp:      TempSrc:      Resp: 29 21 24 21   Height:      Weight:      SpO2:    96%   Weight change:   Intake/Output Summary (Last 24 hours) at 11/04/15 1200 Last data filed at 11/04/15 6578  Gross per 24 hour  Intake 1089.5 ml  Output    850 ml  Net  239.5 ml    Physical Exam  General: alert, well-developed, and cooperative to examination.  Lungs: normal respiratory effort, no accessory muscle use, normal breath sounds, no crackles, and no wheezes. Heart: tachycardic, regular rhythm, holosystolic murmur, no gallop, and no rub.  Abdomen: soft, non-tender, normal bowel sounds, no distention Msk: no joint swelling, no joint warmth, and no redness over joints.  Pulses: 2+ DP/PT pulses bilaterally Extremities: No cyanosis, clubbing, edema Skin: turgor normal and no rashes.   Medications: I have reviewed the patient's current medications. Scheduled Meds: . aspirin  81 mg Oral Daily  . Chlorhexidine Gluconate Cloth  6 each Topical Q0600  . enoxaparin (LOVENOX) injection  30 mg Subcutaneous Q24H  . levofloxacin  500 mg Oral Daily  . mupirocin ointment  1 application Nasal BID   Continuous Infusions: . diltiazem (CARDIZEM) infusion 5 mg/hr (11/04/15 0138)   PRN Meds:.promethazine Assessment/Plan:  Community Acquired Pneumonia: Afebrile overnight. WBC 11.1 -> 9.1 today. She reports her cough has resolved. Mg 2.5 this am. Satting upper 90s on RA. Lung exam clear today. Eating and drinking well this morning. UDS is positive for THC and  opiates. Admitted to [facility] use but not opoid use and has not received any here.  - Continue Levaquin - D/C IVF  A. Flutter: HR was in the 130s overnight. Initially started on Cardizem 30 mg q6hr on admission and transitioned to Cardizem ggt overnight for better rate control. She has been on 5 mg/hr overngiht with initial improvement in rate to the 80s. Rate has increased to 110-130s since she woke up the morning and has become more active. EKG this am still in A. Flutter. CHADS-VASc score of 1 (female). Will start on ASA.  -ECHO -Telemetry -TSH wnl -Start ASA -Appreciate cardiology consult   Hypokalemia: K 3.4 on admission. Received 2 doses of KDur 40 mEq. Improved to 3.9 this morning.   Dispo: Disposition is deferred at this time, awaiting improvement of current medical problems.  Anticipated discharge in approximately 1-2 day(s).   The patient does not have a current PCP (No primary care provider on file.) and does need an [facility] hospital follow-up appointment after discharge.  The patient does not have transportation limitations that hinder transportation to clinic appointments.  .Services Needed at time of discharge: Y = Yes, Blank = No PT:   OT:   RN:   Equipment:   Other:     LOS: 1 day   Valentino Nose, MD IMTS PGY-1 [phone] 11/04/2015, 12:00 PM

## 2015-11-04 NOTE — Consult Note (Deleted)
Entered in error

## 2015-11-04 NOTE — Clinical Social Work Note (Signed)
CSW was notified in progression that patient required transportation assistance to appointments. The patient states she has her own car and does not require any assistance with getting to her appointments. CSW signing off.  Roddie Mc MSW, [place], [place], [phone]

## 2015-11-04 NOTE — Progress Notes (Signed)
Pt SOB. Pt increase in non productive cough, crackles in lung bases. Paged on call NP. See new orders for lasix. Will continue to monitor.

## 2015-11-05 ENCOUNTER — Inpatient Hospital Stay (HOSPITAL_COMMUNITY): Payer: MEDICAID

## 2015-11-05 DIAGNOSIS — I4892 Unspecified atrial flutter: Secondary | ICD-10-CM | POA: Insufficient documentation

## 2015-11-05 DIAGNOSIS — Q249 Congenital malformation of heart, unspecified: Secondary | ICD-10-CM | POA: Insufficient documentation

## 2015-11-05 DIAGNOSIS — R011 Cardiac murmur, unspecified: Secondary | ICD-10-CM | POA: Insufficient documentation

## 2015-11-05 DIAGNOSIS — R0600 Dyspnea, unspecified: Secondary | ICD-10-CM

## 2015-11-05 LAB — CBC
HCT: 37.5 % (ref 36.0–46.0)
Hemoglobin: 12.8 g/dL (ref 12.0–15.0)
MCH: 30.8 pg (ref 26.0–34.0)
MCHC: 34.1 g/dL (ref 30.0–36.0)
MCV: 90.1 fL (ref 78.0–100.0)
Platelets: 284 10*3/uL (ref 150–400)
RBC: 4.16 MIL/uL (ref 3.87–5.11)
RDW: 13.5 % (ref 11.5–15.5)
WBC: 13.6 10*3/uL — ABNORMAL HIGH (ref 4.0–10.5)

## 2015-11-05 LAB — BASIC METABOLIC PANEL
ANION GAP: 10 (ref 5–15)
BUN: 24 mg/dL — ABNORMAL HIGH (ref 6–20)
CO2: 22 mmol/L (ref 22–32)
Calcium: 8.3 mg/dL — ABNORMAL LOW (ref 8.9–10.3)
Chloride: 102 mmol/L (ref 101–111)
Creatinine, Ser: 1.05 mg/dL — ABNORMAL HIGH (ref 0.44–1.00)
GFR calc Af Amer: >60 mL/min (ref >60)
Glucose, Bld: 149 mg/dL — ABNORMAL HIGH (ref 65–99)
POTASSIUM: 3.9 mmol/L (ref 3.5–5.1)
SODIUM: 134 mmol/L — AB (ref 135–145)

## 2015-11-05 LAB — HEPARIN LEVEL (UNFRACTIONATED)
HEPARIN UNFRACTIONATED: 0.42 [IU]/mL (ref 0.30–0.70)
Heparin Unfractionated: 0.53 IU/mL (ref 0.30–0.70)

## 2015-11-05 LAB — HEMOGLOBIN A1C
Hgb A1c MFr Bld: 5.9 % — ABNORMAL HIGH (ref 4.8–5.6)
MEAN PLASMA GLUCOSE: 123 mg/dL

## 2015-11-05 LAB — MAGNESIUM: MAGNESIUM: 1.6 mg/dL — AB (ref 1.7–2.4)

## 2015-11-05 LAB — D-DIMER, QUANTITATIVE (NOT AT ARMC)

## 2015-11-05 MED ORDER — FUROSEMIDE 10 MG/ML IJ SOLN: 40.0000 mg | Freq: Once | INTRAMUSCULAR | Status: DC

## 2015-11-05 MED ORDER — MAGNESIUM SULFATE 2 GM/50ML IV SOLN
2.0000 g | Freq: Once | INTRAVENOUS | Status: AC
  Administered 2015-11-05: 2 g via INTRAVENOUS
  Filled 2015-11-05: qty 50

## 2015-11-05 NOTE — Progress Notes (Signed)
ANTICOAGULATION CONSULT NOTE - Follow-up Consult  Pharmacy Consult for heparin Indication: atrial fibrillation  Allergies  Allergen Reactions  . Penicillins Cross Reactors Swelling and Other (See Comments)    Mouth Ulcers Has patient had a PCN reaction causing immediate rash, facial/tongue/throat swelling, SOB or lightheadedness with hypotension: Yes Has patient had a PCN reaction causing severe rash involving mucus membranes or skin necrosis: Yes Has patient had a PCN reaction that required hospitalization No Has patient had a PCN reaction occurring within the last 10 years: No If all of the above answers are "NO", then may proceed with Cephalosporin use.     Patient Measurements: Height: 5\' 3"  (160 cm) Weight: 98 lb 9.6 oz (44.725 kg) IBW/kg (Calculated) : 52.4 Heparin Dosing Weight: 43.8  Vital Signs: Temp: 97.6 F (36.4 C) (02/24 2307) Temp Source: Oral (02/24 2000) BP: 120/53 mmHg (02/25 0007) Pulse Rate: 68 (02/25 0007)  Labs:  Recent Labs  11/03/15 1207 11/04/15 0500 11/05/15 0210 11/05/15 0216 11/05/15 0355  HGB 15.4* 13.2 12.8  --   --   HCT 45.5 37.9 37.5  --   --   PLT 277 253 284  --   --   LABPROT 14.8  --   --   --   --   INR 1.14  --   --   --   --   HEPARINUNFRC  --   --  >2.20*  --  0.53  CREATININE 0.95 0.60  --  1.05*  --     Estimated Creatinine Clearance: 48.8 mL/min (by C-G formula based on Cr of 1.05).   Assessment: 45 yo F on heparin for afib. Heparin level 0.53 (therapeutic) on 650 units/hr. No bleeding noted.   Goal of Therapy:  Heparin level 0.3-0.7 units/ml Monitor platelets by anticoagulation protocol: Yes   Plan:  Continue heparin at 650 units/h Will f/u 6 hr confirmatory level  Christoper Fabian, PharmD, BCPS Clinical pharmacist, pager [phone] 11/05/2015 4:42 AM

## 2015-11-05 NOTE — Progress Notes (Signed)
Echocardiogram 2D Echocardiogram has been performed.  Sheila Branch 11/05/2015, 11:47 AM

## 2015-11-05 NOTE — Progress Notes (Signed)
   Subjective: This morning, she reports feeling better. We reviewed the implications of atrial flutter and why she is on IV heparin. Telemetry overnight remarkable for some PVCs, atrial flutter with 2:1 block.   Objective: Vital signs in last 24 hours: Filed Vitals:   11/05/15 0551 11/05/15 0604 11/05/15 0628 11/05/15 0800  BP: 115/58  107/54   Pulse: 68 68 66   Temp:    97.7 F (36.5 C)  TempSrc:    Oral  Resp:  24 23   Height:      Weight:      SpO2:  95% 96%    Weight change: 1 lb (0.454 kg)  Intake/Output Summary (Last 24 hours) at 11/05/15 0712 Last data filed at 11/05/15 1975  Gross per 24 hour  Intake    478 ml  Output   2000 ml  Net  -1522 ml    Physical Exam  General: Thin, young Caucasian female, alert, well-developed, and cooperative to examination.  Lungs: normal respiratory effort, no accessory muscle use, normal breath sounds, no crackles, and no wheezes. Heart: regular rate, rhythm, holosystolic murmur, no gallop, and no rub.  Abdomen: soft, non-tender, normal bowel sounds, no distention Pulses: 2+ DP/PT pulses bilaterally Extremities: No cyanosis, clubbing, edema Skin: turgor normal and no rashes.   Medications: I have reviewed the patient's current medications. Scheduled Meds: . aspirin  81 mg Oral Daily  . Chlorhexidine Gluconate Cloth  6 each Topical Q0600  . Influenza vac split quadrivalent PF  0.5 mL Intramuscular Tomorrow-1000  . levofloxacin  500 mg Oral Daily  . metoprolol tartrate  25 mg Oral Q6H  . mupirocin ointment  1 application Nasal BID   Continuous Infusions: . diltiazem (CARDIZEM) infusion Stopped (11/05/15 0400)  . heparin 650 Units (11/04/15 1900)   PRN Meds:.promethazine Assessment/Plan:  Community Acquired Pneumonia: Afebrile overnight x 2 days. WBC 13.6, up from 9.1 yesterday though may be concentration from IV Lasix.    - Continue Levaquin Day 3/5  A. Flutter: HR in the 60s-70s overnight. Off dilitizem gtt and on Lopressor  25 every 6 hours. CHADS-VASc score of 1 (female).  -Follow-up ECHO -Continue telemetry -Continue ASA 81mg  -Continue heparin gtt per cardiology for cardioversion per Cardiology -Cardiology following, appreciate recommendations  Pre-diabetes: A1c 5.9.  -Advise lifestyle modification.  Hypomagnesemia: Mg 1.6 this AM so gave Mg 2g IV. K 3.8, reassuring.   Dispo: Disposition is deferred at this time, awaiting improvement of current medical problems.  Anticipated discharge in approximately 1-2 day(s).   The patient does not have a current PCP (No primary care provider on file.) and does need an [facility] hospital follow-up appointment after discharge.  The patient does not have transportation limitations that hinder transportation to clinic appointments.  .Services Needed at time of discharge: Y = Yes, Blank = No PT:   OT:   RN:   Equipment:   Other:     LOS: 2 days   Beather Arbour, MD 11/05/2015, 9:09 AM

## 2015-11-05 NOTE — Progress Notes (Addendum)
Pt. Profile  Sheila Branch is a 45 year old female with past medical history of congenital heart disease who has not seen a PCP for the past 18 years who came in with pneumonia and also noted to be atrial flutter with RVR. She does have intermittent cardiac awareness with her heart was fast, however we do not know the actual onset of atrial flutter. She also had noted to have a loud murmur on physical exam. Cardiology has been asked to manage atrial flutter   SUBJECTIVE: Has some mild right flank pain, no chest pain or palpitations at present.     Intake/Output Summary (Last 24 hours) at 11/05/15 1053 Last data filed at 11/05/15 8295  Gross per 24 hour  Intake    478 ml  Output   2000 ml  Net  -1522 ml    Current Facility-Administered Medications  Medication Dose Route Frequency Provider Last Rate Last Dose  . aspirin chewable tablet 81 mg  81 mg Oral Daily Valentino Nose, MD   81 mg at 11/05/15 0846  . Chlorhexidine Gluconate Cloth 2 % PADS 6 each  6 each Topical Q0600 Earl Lagos, MD   6 each at 11/05/15 0551  . diltiazem (CARDIZEM) 100 mg in dextrose 5 % 100 mL (1 mg/mL) infusion  5-15 mg/hr Intravenous Titrated Ejiroghene E Emokpae, MD   Stopped at 11/05/15 0400  . heparin ADULT infusion 100 units/mL (25000 units/250 mL)  650 Units/hr Intravenous Continuous Herby Abraham, RPH 6.5 mL/hr at 11/04/15 1900 650 Units at 11/04/15 1900  . Influenza vac split quadrivalent PF (FLUARIX) injection 0.5 mL  0.5 mL Intramuscular Tomorrow-1000 Nischal Narendra, MD      . levofloxacin (LEVAQUIN) tablet 500 mg  500 mg Oral Daily Rushil Terrilee Croak, MD   500 mg at 11/05/15 0845  . metoprolol tartrate (LOPRESSOR) tablet 25 mg  25 mg Oral Q6H Joellyn Rued, MD   25 mg at 11/05/15 0846  . mupirocin ointment (BACTROBAN) 2 % 1 application  1 application Nasal BID Earl Lagos, MD   1 application at 11/05/15 0846  . promethazine (PHENERGAN) tablet 12.5 mg  12.5 mg Oral Q6H PRN Beather Arbour, MD    12.5 mg at 11/04/15 2307    Filed Vitals:   11/05/15 0551 11/05/15 0604 11/05/15 0628 11/05/15 0800  BP: 115/58  107/54   Pulse: 68 68 66   Temp:    97.7 F (36.5 C)  TempSrc:    Oral  Resp:  24 23   Height:      Weight:      SpO2:  95% 96%     PHYSICAL EXAM General: Pleasant, NAD Psych: Normal affect. Neuro: Alert and oriented X 3. Moves all extremities spontaneously. HEENT: Normal Neck: Supple without bruits or JVD. Lungs: Resp regular and unlabored. R basilar rhonchi. Heart: Irregular rhythm, normal rate, no s3, s4. 5/6 loud systolic murmur and thrill Abdomen: Soft, non-tender, non-distended, BS + x 4.  Extremities: No clubbing, cyanosis or edema. DP/PT/Radials 2+ and equal bilaterally.  TELEMETRY: Reviewed telemetry pt in atrial flutter with variable conduction, 4-beat run of NSVT.  LABS: Basic Metabolic Panel:  Recent Labs  62/13/08 0500 11/05/15 0216  NA 134* 134*  K 3.9 3.9  CL 100* 102  CO2 23 22  GLUCOSE 132* 149*  BUN 15 24*  CREATININE 0.60 1.05*  CALCIUM 8.4* 8.3*  MG 2.5* 1.6*   Liver Function Tests:  Recent Labs  11/03/15 1207  AST 43*  ALT 27  ALKPHOS 118  BILITOT 1.7*  PROT 7.7  ALBUMIN 3.5   No results for input(s): LIPASE, AMYLASE in the last 72 hours. CBC:  Recent Labs  11/03/15 1207 11/04/15 0500 11/05/15 0210  WBC 11.1* 9.1 13.6*  NEUTROABS 7.5  --   --   HGB 15.4* 13.2 12.8  HCT 45.5 37.9 37.5  MCV 91.5 91.1 90.1  PLT 277 253 284   Cardiac Enzymes: No results for input(s): CKTOTAL, CKMB, CKMBINDEX, TROPONINI in the last 72 hours. BNP: Invalid input(s): POCBNP D-Dimer: No results for input(s): DDIMER in the last 72 hours. Hemoglobin A1C:  Recent Labs  11/04/15 0500  HGBA1C 5.9*   Fasting Lipid Panel: No results for input(s): CHOL, HDL, LDLCALC, TRIG, CHOLHDL, LDLDIRECT in the last 72 hours. Thyroid Function Tests:  Recent Labs  11/03/15 1925  TSH 0.984   Anemia Panel: No results for  input(s): VITAMINB12, FOLATE, FERRITIN, TIBC, IRON, RETICCTPCT in the last 72 hours.  RADIOLOGY: Dg Chest 2 View  11/03/2015  CLINICAL DATA:  Productive cough and recurring fever for 2 weeks. Patient has a history of mitral and aortic valvular disease. EXAM: CHEST  2 VIEW COMPARISON:  November 03, 2015, July 25, 2011 FINDINGS: The heart size and mediastinal contours are stable. The heart size is enlarged. Enlarged central pulmonary vessel caliber are noted unchanged. Previously noted patchy consolidation of the medial right infrahilar region is unchanged. There is no pleural effusion. The visualized skeletal structures are stable. There is scoliosis of spine. IMPRESSION: Congestive heart failure. Patchy consolidation of the medial right infrahilar region, superimposed pneumonia is not excluded. This not significant changed from earlier exam today. Electronically Signed   By: Sherian Rein M.D.   On: 11/03/2015 16:02   Dg Chest Port 1 View  11/03/2015  CLINICAL DATA:  Shortness of breath, cough for 1 week EXAM: PORTABLE CHEST 1 VIEW COMPARISON:  07/25/2011 FINDINGS: There is significant cardiomegaly with progression from prior exam. No convincing pulmonary edema. Mild right hilar prominence. Although may be vascular in nature adenopathy cannot be excluded. There is hazy infiltrate or infiltrative process in right infrahilar region. Although this may be due to pneumonia neoplastic process cannot be excluded. Follow-up to resolution is recommended. IMPRESSION: Significant cardiomegaly with progression from prior exam.Mild right hilar prominence. Although may be vascular in nature adenopathy cannot be excluded. There is hazy infiltrate or infiltrative process in right infrahilar region. Although this may be due to pneumonia neoplastic process cannot be excluded. Follow-up to resolution is recommended. Electronically Signed   By: Natasha Mead M.D.   On: 11/03/2015 12:41      ASSESSMENT AND PLAN: 1. Rapid  atrial flutter: HR controlled on IV diltiazem and oral metoprolol. Anticoagulated with heparin. Await echo results to decide on warfarin vs direct oral anticoagulant. Likely cause is structural heart disease and PNA.  2. Congenital heart disease with loud murmur: Await echo results.  3. CAP: Continue Levaquin.   Prentice Docker, M.D., F.A.C.C.  ADDENDUM: Reviewed echo images with Dr. Duke Salvia. Unclear whether or not possible PA thrombus vs acoustic shadowing. RV unremarkable. Will check d-dimer.

## 2015-11-05 NOTE — Progress Notes (Signed)
Call placed to Internal Medicine resident re: am labs Magnesium level 1.6 from 2.5 yesterday, she called right back and was updated on am labs and patient's general condition, will speak with the team but put an order in to replace her magnesium.

## 2015-11-05 NOTE — Plan of Care (Signed)
Problem: Safety: Goal: Ability to remain free from injury will improve Outcome: Progressing Instructed patient to keep SR up x 2 for aid in repositioning and to call her RN or tech using the white board for assistance before getting OOB, patient verbalized understanding and return demonstrated how to use call light and phone, her personal belongings are on her bedside table within her reach, will continue to monitor.

## 2015-11-05 NOTE — Progress Notes (Signed)
ANTICOAGULATION CONSULT NOTE - Follow-up Consult  Pharmacy Consult for heparin Indication: atrial fibrillation  Allergies  Allergen Reactions  . Penicillins Cross Reactors Swelling and Other (See Comments)    Mouth Ulcers Has patient had a PCN reaction causing immediate rash, facial/tongue/throat swelling, SOB or lightheadedness with hypotension: Yes Has patient had a PCN reaction causing severe rash involving mucus membranes or skin necrosis: Yes Has patient had a PCN reaction that required hospitalization No Has patient had a PCN reaction occurring within the last 10 years: No If all of the above answers are "NO", then may proceed with Cephalosporin use.     Patient Measurements: Height: 5\' 3"  (160 cm) Weight: 97 lb 8 oz (44.226 kg) IBW/kg (Calculated) : 52.4 Heparin Dosing Weight: 43.8  Vital Signs: Temp: 97.8 F (36.6 C) (02/25 1125) Temp Source: Oral (02/25 1125) BP: 110/74 mmHg (02/25 1125) Pulse Rate: 66 (02/25 1125)  Labs:  Recent Labs  11/03/15 1207 11/04/15 0500 11/05/15 0210 11/05/15 0216 11/05/15 0355 11/05/15 1101  HGB 15.4* 13.2 12.8  --   --   --   HCT 45.5 37.9 37.5  --   --   --   PLT 277 253 284  --   --   --   LABPROT 14.8  --   --   --   --   --   INR 1.14  --   --   --   --   --   HEPARINUNFRC  --   --  >2.20*  --  0.53 0.42  CREATININE 0.95 0.60  --  1.05*  --   --     Estimated Creatinine Clearance: 48.2 mL/min (by C-G formula based on Cr of 1.05).   Assessment: 45 yo F on heparin for afib. Heparin level therapeutic on 650 units/hr. No bleeding noted.   Goal of Therapy:  Heparin level 0.3-0.7 units/ml Monitor platelets by anticoagulation protocol: Yes   Plan:  Continue heparin at 650 units/hr Daily heparin level and CBC Follow-up plans for PO anticoagulation  [employer], Pharm.D., BCPS Clinical Pharmacist Pager [phone] 11/05/2015 12:56 PM

## 2015-11-05 NOTE — Progress Notes (Signed)
HR noted to be sustaining in 130s - pt states "I can feel it beating pretty fast." IV dilt stopped this am at 0400 due to HR sustaining in the 60s. Spoke with Randall An, PA with cardiology who asked that we resume the IV dilt. Order carried out. Will continue to monitor pt closely.

## 2015-11-06 DIAGNOSIS — I38 Endocarditis, valve unspecified: Secondary | ICD-10-CM

## 2015-11-06 DIAGNOSIS — R931 Abnormal findings on diagnostic imaging of heart and coronary circulation: Secondary | ICD-10-CM

## 2015-11-06 DIAGNOSIS — Z7901 Long term (current) use of anticoagulants: Secondary | ICD-10-CM

## 2015-11-06 DIAGNOSIS — I351 Nonrheumatic aortic (valve) insufficiency: Secondary | ICD-10-CM

## 2015-11-06 DIAGNOSIS — I350 Nonrheumatic aortic (valve) stenosis: Secondary | ICD-10-CM

## 2015-11-06 DIAGNOSIS — Z5181 Encounter for therapeutic drug level monitoring: Secondary | ICD-10-CM

## 2015-11-06 LAB — BASIC METABOLIC PANEL
ANION GAP: 11 (ref 5–15)
BUN: 29 mg/dL — ABNORMAL HIGH (ref 6–20)
CALCIUM: 8.7 mg/dL — AB (ref 8.9–10.3)
CHLORIDE: 101 mmol/L (ref 101–111)
CO2: 23 mmol/L (ref 22–32)
Creatinine, Ser: 1.04 mg/dL — ABNORMAL HIGH (ref 0.44–1.00)
GFR calc non Af Amer: >60 mL/min (ref >60)
Glucose, Bld: 104 mg/dL — ABNORMAL HIGH (ref 65–99)
Potassium: 4.3 mmol/L (ref 3.5–5.1)
SODIUM: 135 mmol/L (ref 135–145)

## 2015-11-06 LAB — CBC
HCT: 42.3 % (ref 36.0–46.0)
Hemoglobin: 14 g/dL (ref 12.0–15.0)
MCH: 30 pg (ref 26.0–34.0)
MCHC: 33.1 g/dL (ref 30.0–36.0)
MCV: 90.8 fL (ref 78.0–100.0)
Platelets: 313 10*3/uL (ref 150–400)
RBC: 4.66 MIL/uL (ref 3.87–5.11)
RDW: 13.6 % (ref 11.5–15.5)
WBC: 9.1 10*3/uL (ref 4.0–10.5)

## 2015-11-06 LAB — MAGNESIUM: MAGNESIUM: 1.9 mg/dL (ref 1.7–2.4)

## 2015-11-06 LAB — HEPARIN LEVEL (UNFRACTIONATED): Heparin Unfractionated: 0.32 IU/mL (ref 0.30–0.70)

## 2015-11-06 MED ORDER — FUROSEMIDE 10 MG/ML IJ SOLN
INTRAMUSCULAR | Status: AC
  Administered 2015-11-06: 40 mg
  Filled 2015-11-06: qty 4

## 2015-11-06 MED ORDER — MAGNESIUM SULFATE 2 GM/50ML IV SOLN
2.0000 g | Freq: Once | INTRAVENOUS | Status: AC
Start: 2015-11-06 — End: 2015-11-06
  Administered 2015-11-06: 2 g via INTRAVENOUS
  Filled 2015-11-06: qty 50

## 2015-11-06 MED ORDER — ACETAMINOPHEN 325 MG PO TABS
650.0000 mg | ORAL_TABLET | Freq: Once | ORAL | Status: AC
  Administered 2015-11-06: 650 mg via ORAL
  Filled 2015-11-06: qty 2

## 2015-11-06 MED ORDER — RIVAROXABAN 15 MG PO TABS
15.0000 mg | ORAL_TABLET | Freq: Every day | ORAL | Status: DC
  Administered 2015-11-06 – 2015-11-09 (×4): 15 mg via ORAL
  Filled 2015-11-06 (×4): qty 1

## 2015-11-06 MED ORDER — ALBUTEROL SULFATE (2.5 MG/3ML) 0.083% IN NEBU
2.5000 mg | INHALATION_SOLUTION | Freq: Once | RESPIRATORY_TRACT | Status: AC
  Administered 2015-11-06: 2.5 mg via RESPIRATORY_TRACT

## 2015-11-06 MED ORDER — ALBUTEROL SULFATE (2.5 MG/3ML) 0.083% IN NEBU
INHALATION_SOLUTION | RESPIRATORY_TRACT | Status: AC
  Administered 2015-11-06: 2.5 mg via RESPIRATORY_TRACT
  Filled 2015-11-06: qty 3

## 2015-11-06 MED ORDER — DILTIAZEM HCL ER COATED BEADS 120 MG PO CP24
120.0000 mg | ORAL_CAPSULE | Freq: Every day | ORAL | Status: DC
  Administered 2015-11-06: 120 mg via ORAL
  Filled 2015-11-06: qty 1

## 2015-11-06 MED ORDER — GUAIFENESIN-DM 100-10 MG/5ML PO SYRP
5.0000 mL | ORAL_SOLUTION | ORAL | Status: DC | PRN
  Administered 2015-11-07: 5 mL via ORAL
  Filled 2015-11-06: qty 5

## 2015-11-06 MED ORDER — OFF THE BEAT BOOK
Freq: Once | Status: AC
Start: 2015-11-06 — End: 2015-11-06
  Administered 2015-11-06: 05:00:00
  Filled 2015-11-06: qty 1

## 2015-11-06 MED ORDER — FUROSEMIDE 10 MG/ML IJ SOLN: 40.0000 mg | Freq: Once | INTRAMUSCULAR | Status: AC

## 2015-11-06 MED ORDER — METOPROLOL TARTRATE 50 MG PO TABS
50.0000 mg | ORAL_TABLET | Freq: Two times a day (BID) | ORAL | Status: DC
  Administered 2015-11-06 – 2015-11-10 (×9): 50 mg via ORAL
  Filled 2015-11-06 (×9): qty 1

## 2015-11-06 NOTE — Discharge Instructions (Signed)
Information on my medicine - XARELTO (Rivaroxaban)  This medication education was reviewed with me or my healthcare representative as part of my discharge preparation.  The pharmacist that spoke with me during my hospital stay was:  [employer], Pharm.D, BCPS  Why was Xarelto prescribed for you? Xarelto was prescribed for you to reduce the risk of a blood clot forming that can cause a stroke if you have a medical condition called atrial fibrillation (a type of irregular heartbeat).  What do you need to know about xarelto ? Take your Xarelto ONCE DAILY at the same time every day with your evening meal. If you have difficulty swallowing the tablet whole, you may crush it and mix in applesauce just prior to taking your dose.  Take Xarelto exactly as prescribed by your doctor and DO NOT stop taking Xarelto without talking to the doctor who prescribed the medication.  Stopping without other stroke prevention medication to take the place of Xarelto may increase your risk of developing a clot that causes a stroke.  Refill your prescription before you run out.  After discharge, you should have regular check-up appointments with your healthcare provider that is prescribing your Xarelto.  In the future your dose may need to be changed if your kidney function or weight changes by a significant amount.  What do you do if you miss a dose? If you are taking Xarelto ONCE DAILY and you miss a dose, take it as soon as you remember on the same day then continue your regularly scheduled once daily regimen the next day. Do not take two doses of Xarelto at the same time or on the same day.   Important Safety Information A possible side effect of Xarelto is bleeding. You should call your healthcare provider right away if you experience any of the following: ? Bleeding from an injury or your nose that does not stop. ? Unusual colored urine (red or dark brown) or unusual colored stools (red or  black). ? Unusual bruising for unknown reasons. ? A serious fall or if you hit your head (even if there is no bleeding).  Some medicines may interact with Xarelto and might increase your risk of bleeding while on Xarelto. To help avoid this, consult your healthcare provider or pharmacist prior to using any new prescription or non-prescription medications, including herbals, vitamins, non-steroidal anti-inflammatory drugs (NSAIDs) and supplements.  This website has more information on Xarelto: [email or web address].br.

## 2015-11-06 NOTE — Progress Notes (Signed)
Pt. Profile  Sheila Branch is a 45 year old female with past medical history of congenital heart disease who has not seen a PCP for the past 18 years who came in with pneumonia and also noted to be atrial flutter with RVR. She does have intermittent cardiac awareness with her heart was fast, however we do not know the actual onset of atrial flutter. She also had noted to have a loud murmur on physical exam. Cardiology has been asked to manage atrial flutter.   SUBJECTIVE: Feeling better this morning. When she gets meds she gets "stressed out" and HR increases. Got put back on IV diltiazem. Remains on heparin.     Intake/Output Summary (Last 24 hours) at 11/06/15 0933 Last data filed at 11/06/15 0857  Gross per 24 hour  Intake 1154.83 ml  Output    250 ml  Net 904.83 ml    Current Facility-Administered Medications  Medication Dose Route Frequency Provider Last Rate Last Dose  . aspirin chewable tablet 81 mg  81 mg Oral Daily Valentino Nose, MD   81 mg at 11/06/15 0810  . Chlorhexidine Gluconate Cloth 2 % PADS 6 each  6 each Topical Q0600 Earl Lagos, MD   6 each at 11/06/15 0809  . diltiazem (CARDIZEM) 100 mg in dextrose 5 % 100 mL (1 mg/mL) infusion  5-15 mg/hr Intravenous Titrated Ejiroghene E Emokpae, MD 5 mL/hr at 11/06/15 0811 5 mg/hr at 11/06/15 0811  . heparin ADULT infusion 100 units/mL (25000 units/250 mL)  650 Units/hr Intravenous Continuous Herby Abraham, RPH 6.5 mL/hr at 11/06/15 0255 650 Units/hr at 11/06/15 0255  . Influenza vac split quadrivalent PF (FLUARIX) injection 0.5 mL  0.5 mL Intramuscular Tomorrow-1000 Nischal Narendra, MD      . levofloxacin (LEVAQUIN) tablet 500 mg  500 mg Oral Daily Rushil Terrilee Croak, MD   500 mg at 11/06/15 0810  . metoprolol tartrate (LOPRESSOR) tablet 25 mg  25 mg Oral Q6H Joellyn Rued, MD   25 mg at 11/06/15 1000  . mupirocin ointment (BACTROBAN) 2 % 1 application  1 application Nasal BID Earl Lagos, MD   1 application at  11/06/15 0809  . promethazine (PHENERGAN) tablet 12.5 mg  12.5 mg Oral Q6H PRN Beather Arbour, MD   12.5 mg at 11/04/15 2307    Filed Vitals:   11/05/15 2329 11/06/15 0456 11/06/15 0600 11/06/15 0744  BP: 117/61 123/55 113/71 119/49  Pulse: 85 72 135 68  Temp: 97.8 F (36.6 C)  97.6 F (36.4 C) 97.8 F (36.6 C)  TempSrc: Oral  Oral Oral  Resp: Height:      Weight:   97 lb 11.2 oz (44.316 kg)   SpO2: 96%  97% 100%    PHYSICAL EXAM General: Pleasant, NAD Psych: Normal affect. Neuro: Alert and oriented X 3. Moves all extremities spontaneously. HEENT: Normal Neck: Supple without bruits or JVD. Lungs: Resp regular and unlabored. No rales. Heart: Irregular rhythm, normal rate, no s3, s4. 5/6 loud systolic murmur and thrill, 2/4 holodiastolic murmur lower LSB. Abdomen: Soft, non-tender, non-distended, BS + x 4.  Extremities: No clubbing, cyanosis or edema. DP/PT/Radials 2+ and equal bilaterally.  Tele: A flutter  LABS: Basic Metabolic Panel:  Recent Labs  16/10/96 0216 11/06/15 0539  NA 134* 135  K 3.9 4.3  CL 102 101  CO2 22 23  GLUCOSE 149* 104*  BUN 24* 29*  CREATININE 1.05* 1.04*  CALCIUM 8.3* 8.7*  MG 1.6*  1.9   Liver Function Tests:  Recent Labs  11/03/15 1207  AST 43*  ALT 27  ALKPHOS 118  BILITOT 1.7*  PROT 7.7  ALBUMIN 3.5   No results for input(s): LIPASE, AMYLASE in the last 72 hours. CBC:  Recent Labs  11/03/15 1207  11/05/15 0210 11/06/15 0539  WBC 11.1*  < > 13.6* 9.1  NEUTROABS 7.5  --   --   --   HGB 15.4*  < > 12.8 14.0  HCT 45.5  < > 37.5 42.3  MCV 91.5  < > 90.1 90.8  PLT 277  < > 284 313  < > = values in this interval not displayed. Cardiac Enzymes: No results for input(s): CKTOTAL, CKMB, CKMBINDEX, TROPONINI in the last 72 hours. BNP: Invalid input(s): POCBNP D-Dimer:  Recent Labs  11/05/15 1454  DDIMER <0.27   Hemoglobin A1C:  Recent Labs  11/04/15 0500  HGBA1C 5.9*   Fasting Lipid  Panel: No results for input(s): CHOL, HDL, LDLCALC, TRIG, CHOLHDL, LDLDIRECT in the last 72 hours. Thyroid Function Tests:  Recent Labs  11/03/15 1925  TSH 0.984   Anemia Panel: No results for input(s): VITAMINB12, FOLATE, FERRITIN, TIBC, IRON, RETICCTPCT in the last 72 hours.  RADIOLOGY: Dg Chest 2 View  11/03/2015  CLINICAL DATA:  Productive cough and recurring fever for 2 weeks. Patient has a history of mitral and aortic valvular disease. EXAM: CHEST  2 VIEW COMPARISON:  November 03, 2015, July 25, 2011 FINDINGS: The heart size and mediastinal contours are stable. The heart size is enlarged. Enlarged central pulmonary vessel caliber are noted unchanged. Previously noted patchy consolidation of the medial right infrahilar region is unchanged. There is no pleural effusion. The visualized skeletal structures are stable. There is scoliosis of spine. IMPRESSION: Congestive heart failure. Patchy consolidation of the medial right infrahilar region, superimposed pneumonia is not excluded. This not significant changed from earlier exam today. Electronically Signed   By: Sherian Rein M.D.   On: 11/03/2015 16:02   Dg Chest Port 1 View  11/03/2015  CLINICAL DATA:  Shortness of breath, cough for 1 week EXAM: PORTABLE CHEST 1 VIEW COMPARISON:  07/25/2011 FINDINGS: There is significant cardiomegaly with progression from prior exam. No convincing pulmonary edema. Mild right hilar prominence. Although may be vascular in nature adenopathy cannot be excluded. There is hazy infiltrate or infiltrative process in right infrahilar region. Although this may be due to pneumonia neoplastic process cannot be excluded. Follow-up to resolution is recommended. IMPRESSION: Significant cardiomegaly with progression from prior exam.Mild right hilar prominence. Although may be vascular in nature adenopathy cannot be excluded. There is hazy infiltrate or infiltrative process in right infrahilar region. Although this may be  due to pneumonia neoplastic process cannot be excluded. Follow-up to resolution is recommended. Electronically Signed   By: Natasha Mead M.D.   On: 11/03/2015 12:41      ASSESSMENT AND PLAN: 1. Rapid atrial flutter: HR controlled on IV diltiazem and oral metoprolol. Anticoagulated with heparin.  Will switch metoprolol to 50 mg bid and IV diltiazem to Cardizem CD 120 mg daily. CHA2DS2Vasc only 1 (female) with reported h/o congenital heart disease, although no obvious surgical remnants seen by echo. I will stop heparin and start Xarelto 20 mg daily. If she doesn't convert to sinus on her own, could consider cardioversion after 3 weeks of anticoagulation. Also consider EP referral as outpatient for atrial flutter ablation.  2. Valvular heart disease (?CHD): Increased LVOT gradient due to subvalvular calcification with  moderate stenosis and moderate to severe aortic regurgitation.  3. CAP: Continue Levaquin.  4. PA echodensity: I reviewed the images and this appears to be coming from the subaortic calcification causing acoustic shadowing. D-dimer was negative, thus no further workup is indicated.   Prentice Docker, M.D., F.A.C.C.

## 2015-11-06 NOTE — Progress Notes (Signed)
   Subjective: This morning, she has no complaints. She acknowledges the results of her cardiac echo and would like to see if she can be transitioned off IV fluids.   Objective: Vital signs in last 24 hours: Filed Vitals:   11/05/15 2329 11/06/15 0456 11/06/15 0600 11/06/15 0744  BP: 117/61 123/55 113/71 119/49  Pulse: 85 72 135 68  Temp: 97.8 F (36.6 C)  97.6 F (36.4 C) 97.8 F (36.6 C)  TempSrc: Oral  Oral Oral  Resp: 20  22 17   Height:      Weight:   97 lb 11.2 oz (44.316 kg)   SpO2: 96%  97% 100%   Weight change: 3.2 oz (0.091 kg)  Intake/Output Summary (Last 24 hours) at 11/06/15 1144 Last data filed at 11/06/15 0857  Gross per 24 hour  Intake 1154.83 ml  Output    250 ml  Net 904.83 ml    Physical Exam  General: Thin, young Caucasian female, alert, well-developed, and cooperative to examination.  Neck: mild JVD noted Lungs: normal respiratory effort, no accessory muscle use, normal breath sounds, no crackles, and no wheezes. Heart: intermittently irregular rate, regular rhythm, holosystolic murmur, no gallop, and no rub.  Abdomen: soft, non-tender, normal bowel sounds, no distention Pulses: 2+ DP/PT pulses bilaterally Extremities: No cyanosis, clubbing, edema Skin: turgor normal and no rashes.   Medications: I have reviewed the patient's current medications. Scheduled Meds: . Chlorhexidine Gluconate Cloth  6 each Topical Q0600  . diltiazem  120 mg Oral Daily  . Influenza vac split quadrivalent PF  0.5 mL Intramuscular Tomorrow-1000  . levofloxacin  500 mg Oral Daily  . metoprolol tartrate  50 mg Oral BID  . mupirocin ointment  1 application Nasal BID  . rivaroxaban  15 mg Oral Q supper   Continuous Infusions:   PRN Meds:.promethazine Assessment/Plan:  Community Acquired Pneumonia: Afebrile overnight x 2 days. No leukocytosis this AM. She did complain of some cough - Continue Levaquin Day4/5 - Start Robitussin-DM 60mL every 6 hours as needed  A.  Flutter: HR in the 60s-70s overnight. Echo with EF 50-55%, diffuse hypokinesis, calcified subaortic lesion in left ventricular outflow tract, acoustic shadow for which pulmonary embolism cannot be excluded. D-dimer reassuring. PFO cannot be excluded, no findings consistent with ASD, VSD. CHADS-VASc score of 1 (female) so needs to be on some anticoagulation or antiplatelet.  -Continue telemetry -Transition to oral diltiazem 24-hour capsule 120mg  off IV dilt -Transition to Xarelto 20mg  off heparin gtt -Cardiology following, appreciate recommendations  Pre-diabetes: A1c 5.9.  -Advise lifestyle modification.  Hypomagnesemia: Mg 1.9 this AM. Will go ahead and give Mg 2g IV since she is getting Lasix today.  Dispo: Disposition is deferred at this time, awaiting improvement of current medical problems.  Anticipated discharge in approximately 1-2 day(s).   The patient does not have a current PCP (No primary care provider on file.) and does need an [facility] hospital follow-up appointment after discharge.  The patient does not have transportation limitations that hinder transportation to clinic appointments.  .Services Needed at time of discharge: Y = Yes, Blank = No PT:   OT:   RN:   Equipment:   Other:     LOS: 3 days   Beather Arbour, MD 11/06/2015, 11:44 AM

## 2015-11-06 NOTE — Progress Notes (Signed)
Pt c/o SOB and frothy sputum.  RR 22-24/min with oxygen saturation 98% on room air, heart rate 130s with coughing, BP 113/71.  Lungs to auscultation with wheezes throughout and bibasilar rales.  MD text paged, awaiting return call.

## 2015-11-06 NOTE — Progress Notes (Addendum)
ANTICOAGULATION CONSULT NOTE - Follow-up Consult  Pharmacy Consult for heparin Indication: atrial fibrillation  Allergies  Allergen Reactions  . Penicillins Cross Reactors Swelling and Other (See Comments)    Mouth Ulcers Has patient had a PCN reaction causing immediate rash, facial/tongue/throat swelling, SOB or lightheadedness with hypotension: Yes Has patient had a PCN reaction causing severe rash involving mucus membranes or skin necrosis: Yes Has patient had a PCN reaction that required hospitalization No Has patient had a PCN reaction occurring within the last 10 years: No If all of the above answers are "NO", then may proceed with Cephalosporin use.     Patient Measurements: Height: 5\' 3"  (160 cm) Weight: 97 lb 11.2 oz (44.316 kg) IBW/kg (Calculated) : 52.4 Heparin Dosing Weight: 43.8  Vital Signs: Temp: 97.8 F (36.6 C) (02/26 0744) Temp Source: Oral (02/26 0744) BP: 119/49 mmHg (02/26 0744) Pulse Rate: 68 (02/26 0744)  Labs:  Recent Labs  11/03/15 1207 11/04/15 0500  11/05/15 0210 11/05/15 0216 11/05/15 0355 11/05/15 1101 11/06/15 0539 11/06/15 0743  HGB 15.4* 13.2  --  12.8  --   --   --  14.0  --   HCT 45.5 37.9  --  37.5  --   --   --  42.3  --   PLT 277 253  --  284  --   --   --  313  --   LABPROT 14.8  --   --   --   --   --   --   --   --   INR 1.14  --   --   --   --   --   --   --   --   HEPARINUNFRC  --   --   < > >2.20*  --  0.53 0.42  --  0.32  CREATININE 0.95 0.60  --   --  1.05*  --   --  1.04*  --   < > = values in this interval not displayed.  Estimated Creatinine Clearance: 48.8 mL/min (by C-G formula based on Cr of 1.04).   Assessment: 45 yo F on heparin for afib. Heparin level therapeutic on 650 units/hr. No bleeding noted.   Goal of Therapy:  Heparin level 0.3-0.7 units/ml Monitor platelets by anticoagulation protocol: Yes   Plan:  Continue heparin at 650 units/hr Daily heparin level and CBC Follow-up plans for PO  anticoagulation  [employer], Pharm.D., BCPS Clinical Pharmacist Pager [phone] 11/06/2015 9:22 AM  Addendum: Now to transition the patient to Xarelto for afib.  Based on package inster recommendations, will use C-G TBW to calculate CrCl.  Because pt is so small (44 kg), CrCl is 23ml/min and pt qualifies for reduced Xarelto dose of 15mg  daily.  Will discontinue heparin and start Xarelto at time of discontinuation.  [employer], Pharm.D., BCPS Clinical Pharmacist Pager [phone] 11/06/2015 10:18 AM

## 2015-11-07 LAB — MAGNESIUM: Magnesium: 2.1 mg/dL (ref 1.7–2.4)

## 2015-11-07 LAB — CBC
HCT: 41.7 % (ref 36.0–46.0)
Hemoglobin: 14 g/dL (ref 12.0–15.0)
MCH: 29.8 pg (ref 26.0–34.0)
MCHC: 33.6 g/dL (ref 30.0–36.0)
MCV: 88.7 fL (ref 78.0–100.0)
Platelets: 350 10*3/uL (ref 150–400)
RBC: 4.7 MIL/uL (ref 3.87–5.11)
RDW: 13.2 % (ref 11.5–15.5)
WBC: 9.9 10*3/uL (ref 4.0–10.5)

## 2015-11-07 LAB — BASIC METABOLIC PANEL
Anion gap: 14 (ref 5–15)
BUN: 38 mg/dL — ABNORMAL HIGH (ref 6–20)
CHLORIDE: 96 mmol/L — AB (ref 101–111)
CO2: 21 mmol/L — AB (ref 22–32)
CREATININE: 1.23 mg/dL — AB (ref 0.44–1.00)
Calcium: 8.7 mg/dL — ABNORMAL LOW (ref 8.9–10.3)
GFR calc non Af Amer: 53 mL/min — ABNORMAL LOW (ref >60)
Glucose, Bld: 108 mg/dL — ABNORMAL HIGH (ref 65–99)
POTASSIUM: 4.1 mmol/L (ref 3.5–5.1)
Sodium: 131 mmol/L — ABNORMAL LOW (ref 135–145)

## 2015-11-07 MED ORDER — METOPROLOL TARTRATE 1 MG/ML IV SOLN
2.5000 mg | Freq: Once | INTRAVENOUS | Status: AC
  Administered 2015-11-07: 2.5 mg via INTRAVENOUS
  Filled 2015-11-07: qty 5

## 2015-11-07 MED ORDER — DILTIAZEM HCL ER COATED BEADS 180 MG PO CP24
180.0000 mg | ORAL_CAPSULE | Freq: Every day | ORAL | Status: DC
  Administered 2015-11-07 – 2015-11-09 (×3): 180 mg via ORAL
  Filled 2015-11-07 (×3): qty 1

## 2015-11-07 NOTE — Progress Notes (Signed)
   Subjective: Patient's HR was in the 120s overnight. Reports she felt palpitations overnight but denies any chest pain or shortness of breath. Able to ambulate without difficulty or shortness of breath. Denies any further cough.   Objective: Vital signs in last 24 hours: Filed Vitals:   11/06/15 2359 11/07/15 0346 11/07/15 0408 11/07/15 0517  BP: 101/57 100/54 100/60 104/66  Pulse:      Temp: 97.8 F (36.6 C) 98 F (36.7 C)    TempSrc: Oral Oral    Resp: 18 16    Height:      Weight:    97 lb 6.4 oz (44.18 kg)  SpO2: 97% 98% 100% 100%   Weight change: -4.8 oz (-0.136 kg)  Intake/Output Summary (Last 24 hours) at 11/07/15 0820 Last data filed at 11/06/15 2200  Gross per 24 hour  Intake   1380 ml  Output      0 ml  Net   1380 ml    Physical Exam  General: Thin, young Caucasian female, alert, well-developed, and cooperative to examination.  Neck: mild JVD noted Lungs: normal respiratory effort, no accessory muscle use, normal breath sounds, no crackles, and no wheezes. Heart: irregular rate, regular rhythm, holosystolic murmur, no gallop, and no rub.  Abdomen: soft, non-tender, normal bowel sounds, no distention Pulses: 2+ DP/PT pulses bilaterally Extremities: No cyanosis, clubbing, edema Skin: turgor normal and no rashes.   Medications: I have reviewed the patient's current medications. Scheduled Meds: . Chlorhexidine Gluconate Cloth  6 each Topical Q0600  . diltiazem  180 mg Oral Daily  . Influenza vac split quadrivalent PF  0.5 mL Intramuscular Tomorrow-1000  . levofloxacin  500 mg Oral Daily  . metoprolol tartrate  50 mg Oral BID  . mupirocin ointment  1 application Nasal BID  . rivaroxaban  15 mg Oral Q supper   Continuous Infusions:   PRN Meds:.guaiFENesin-dextromethorphan, promethazine Assessment/Plan:  Community Acquired Pneumonia: Afebrile overnight x 3 days. Leukocytosis resolved. Reports no further cough.  - Continue Levaquin Day 5/5 - Robitussin-DM  70mL every 6 hours as needed  A. Flutter: Likely 2/2 PNA. HR in the 120s overnight. EKG overnight with continued A. Flutter, no ST elevation noted. She was transitioned to Diltiazem ER 120 mg PO off IV drip yesterday. TTE with no ASD, PFO or VSD noted. Will have her follow up with cardiology for possible cardioversion in 3 weeks if she does not convert to sinus. Will likely need EP referral for possible ablation.  -Continue telemetry -Increase oral diltiazem 24-hour capsule 180mg   -Continue Xarelto 20mg  -Cardiology following, appreciate recommendations  Pre-diabetes: A1c 5.9.  -Advise lifestyle modification.  Hypomagnesemia: Mg 2.1 this AM  Dispo: Possible d/c today   The patient does not have a current PCP (No primary care provider on file.) and does need an [facility] hospital follow-up appointment after discharge.  The patient does not have transportation limitations that hinder transportation to clinic appointments.  .Services Needed at time of discharge: Y = Yes, Blank = No PT:   OT:   RN:   Equipment:   Other:     LOS: 4 days   Valentino Nose, MD 11/07/2015, 8:20 AM  PGY-1  Pager # [phone]

## 2015-11-07 NOTE — Progress Notes (Signed)
UR COMPLETED  

## 2015-11-07 NOTE — Progress Notes (Signed)
Tried to schedule patient for TEE/DCCV tomorrow, however schedule is full.  Will make NPO at midnight tonight in case there is a possible cancellation.  Patient on the schedule for Wednesday, 11/09/15.

## 2015-11-07 NOTE — Progress Notes (Signed)
Pt heart rate returned to 127 sustained.  BP 100/54.  Dr. Loney Loh notified with orders for 2.5mg  IV metoprolol.  Medication given per orders with no effect.  Heart rate 127 and BP 100/60.  Will update MD.

## 2015-11-07 NOTE — Progress Notes (Signed)
Dr. Loney Loh updated at 0530 regarding pt heart rate.  Day team to follow up per MD.  Nursing to notify MD if heart rate sustains > 130 bpm.  Pt currently resting with eyes closed.  Will continue to monitor.

## 2015-11-07 NOTE — Discharge Summary (Signed)
Name: Sheila Branch MRN: [id number] DOB: [birth year] 45 y.o. PCP: No primary care provider on file.  Date of Admission: 11/03/2015 11:52 AM Date of Discharge: 11/10/2015 Attending Physician: Randall Hiss, MD  Discharge Diagnosis: Principal Problem:   CAP (community acquired pneumonia) Active Problems:   Atrial flutter (HCC)   Hypokalemia   Atrial flutter with rapid ventricular response (HCC)   CHD (congenital heart disease)   Cardiac murmur   Aortic valve disease  Discharge Medications:   Medication List    STOP taking these medications        aspirin 81 MG tablet      TAKE these medications        hydrochlorothiazide 25 MG tablet  Commonly known as:  HYDRODIURIL  Take 1 tablet (25 mg total) by mouth daily.     ibuprofen 200 MG tablet  Commonly known as:  ADVIL,MOTRIN  Take 400 mg by mouth every 6 (six) hours as needed for moderate pain. For pain     metoprolol succinate 100 MG 24 hr tablet  Commonly known as:  TOPROL XL  Take 1 tablet (100 mg total) by mouth daily. Take with or immediately following a meal.     rivaroxaban 20 MG Tabs tablet  Commonly known as:  XARELTO  Take 1 tablet (20 mg total) by mouth daily with supper.        Disposition and follow-up:   Ms.Aidaly NAMIAH DUNNAVANT was discharged from [facility] in Good condition.  At the hospital follow up visit please address:  1.  Community acquired pneumonia: Resolved after 5 days of levaquin(PCN allergy). Needs repeat imaging follow to resolution of right infrahilar infiltrate.  2. Afib/aflutter: Cardioverted 3/1, discharged on metoprolol  XL and xarelto. Plan is for titrate mediations per Cardiology. Is on HCTZ for swelling.  3.  Labs / imaging needed at time of follow-up: chest xray, metabolic panel  Follow-up Appointments: Follow-up Information    Follow up with [facility] On 11/16/2015.   Why:  @ 9am for Hopsital Follow up with Dr.  Lisabeth Register.    Contact information:   [address] [place] [address] [phone]      Follow up with Jacolyn Reedy, PA-C On 11/21/2015.   Specialty:  Cardiology   Why:  9:15am    Contact information:   [address] [place] [address] [phone]       Discharge Instructions:   Consultations: Treatment Team:  Rounding Lbcardiology, MD  Procedures Performed:  Dg Chest 2 View  11/03/2015  CLINICAL DATA:  Productive cough and recurring fever for 2 weeks. Patient has a history of mitral and aortic valvular disease. EXAM: CHEST  2 VIEW COMPARISON:  November 03, 2015, July 25, 2011 FINDINGS: The heart size and mediastinal contours are stable. The heart size is enlarged. Enlarged central pulmonary vessel caliber are noted unchanged. Previously noted patchy consolidation of the medial right infrahilar region is unchanged. There is no pleural effusion. The visualized skeletal structures are stable. There is scoliosis of spine. IMPRESSION: Congestive heart failure. Patchy consolidation of the medial right infrahilar region, superimposed pneumonia is not excluded. This not significant changed from earlier exam today. Electronically Signed   By: Sherian Rein M.D.   On: 11/03/2015 16:02   Dg Chest Port 1 View  11/03/2015  CLINICAL DATA:  Shortness of breath, cough for 1 week EXAM: PORTABLE CHEST 1 VIEW COMPARISON:  07/25/2011 FINDINGS: There  is significant cardiomegaly with progression from prior exam. No convincing pulmonary edema. Mild right hilar prominence. Although may be vascular in nature adenopathy cannot be excluded. There is hazy infiltrate or infiltrative process in right infrahilar region. Although this may be due to pneumonia neoplastic process cannot be excluded. Follow-up to resolution is recommended. IMPRESSION: Significant cardiomegaly with progression from prior exam.Mild right hilar prominence. Although may be vascular in nature  adenopathy cannot be excluded. There is hazy infiltrate or infiltrative process in right infrahilar region. Although this may be due to pneumonia neoplastic process cannot be excluded. Follow-up to resolution is recommended. Electronically Signed   By: Natasha Mead M.D.   On: 11/03/2015 12:41    2D Echo: Study Conclusions  - Left ventricle: The cavity size was mildly dilated. There was mild concentric hypertrophy. Systolic function was mildly reduced. The estimated ejection fraction was in the range of 50% to 55%. Diffuse hypokinesis. - Aortic valve: There appears to be sub-valvular calcification in the LVOT that could be causing sub-valvular stenosis. Trileaflet; mildly thickened, moderately calcified leaflets, mostly the left coronary cusp. Transvalvular velocity was increased. There was moderate stenosis. There was moderate to severe regurgitation. Peak velocity (S): 350 cm/s. Mean gradient (S): 26 mm Hg. Valve area (VTI): 1.14 cm^2. Valve area (Vmax): 1.02 cm^2. Valve area (Vmean): 1.09 cm^2. Regurgitation pressure half-time: 392 ms. - Mitral valve: Calcified annulus. Transvalvular velocity was within the normal range. There was no evidence for stenosis. There was trivial regurgitation. Valve area by continuity equation (using LVOT flow): 2.24 cm^2. - Left atrium: The atrium was severely dilated. - Right ventricle: The cavity size was normal. Wall thickness was normal. Systolic function was normal. - Right atrium: The atrium was moderately dilated. Central venous pressure (est): 15 mm Hg. - Atrial septum: A patent foramen ovale cannot be excluded. - Tricuspid valve: There was mild-moderate regurgitation. - Pulmonic valve: There was mild regurgitation. - Pulmonary arteries: Systolic pressure was moderately increased. PA peak pressure: 50 mm Hg (S). - Inferior vena cava: The vessel was dilated. The respirophasic diameter changes were blunted (<  50%), consistent with elevated central venous pressure.  Impressions:  - No ASD, PFO or VSD closure devide was noted. There is a calcified, sub-aortic lesion in the LVOT that is likely causing sub-valvular stenosis. There is clearly a gradient consistent with moderate stenosis, though it cannot be determined from this echo how much of the gradient is from the aortic valve vs. the subvalvular calcification. There is an echodensity in the main pulmonary artery that is likely acoustic shadow from the cacified aortic valve and subvalvular apparatus. However, thrombus cannot be excluded. If there is suspicion for pulmonary embolism, consider further evaluation.  TEE: LV EF: 40% -  45%  ------------------------------------------------------------------- Indications:   Atrial fibrillation - 427.31.  ------------------------------------------------------------------- Study Conclusions  - Left ventricle: Systolic function was mildly to moderately reduced. The estimated ejection fraction was in the range of 40% to 45%. Diffuse hypokinesis. - Aortic valve: There is thickening and calcification of the left coronary cusp. There is also a calcified subaortic membrane seen about 0.5 cm bellow the oartic valve in the LVOT. The transaortic gradeints were not calculated on the current study. The leaflets don&'t coapt well and there is severe aortic regurgitation. - Left atrium: The atrium was severely dilated. No evidence of thrombus in the atrial cavity or appendage. No evidence of thrombus in the atrial cavity or appendage. No evidence of thrombus in the appendage. - Right atrium: No evidence  of thrombus in the atrial cavity or appendage. - Tricuspid valve: There was mild-moderate regurgitation. - Pulmonic valve: No evidence of vegetation. There was moderate regurgitation.  Diagnostic transesophageal echocardiography. 2D and color  Doppler. Birthdate: Patient birthdate: Dec 16, 1970. Age: Patient is 45 yr old. Sex: Gender: female.  BMI: 17.4 kg/m^2. Blood pressure: 113/74 Patient status: Inpatient. Study date: Study date: 11/09/2015. Study time: 09:12 AM. Location: Endoscopy.  -------------------------------------------------------------------  ------------------------------------------------------------------- Left ventricle: Systolic function was mildly to moderately reduced. The estimated ejection fraction was in the range of 40% to 45%. Diffuse hypokinesis.  ------------------------------------------------------------------- Aortic valve: There is thickening and calcification of the left coronary cusp. There is also a calcified subaortic membrane seen about 0.5 cm bellow the oartic valve in the LVOT. The transaortic gradeints were not calculated on the current study. The leaflets don&'t coapt well and there is severe aortic regurgitation. Structurally normal valve. Trileaflet; normal thickness leaflets. Cusp separation was normal. Doppler: There was no significant regurgitation.  ------------------------------------------------------------------- Aorta: The aorta was normal, not dilated, and non-diseased. There was no atheroma. There was no evidence for dissection. Aortic root: The aortic root was not dilated. Ascending aorta: The ascending aorta was normal in size. Aortic arch: The aortic arch was normal in size. Descending aorta: The descending aorta was normal in size.  ------------------------------------------------------------------- Mitral valve:  Structurally normal valve.  Leaflet separation was normal. Doppler: There was no significant regurgitation.  ------------------------------------------------------------------- Left atrium: The atrium was severely dilated. No evidence of thrombus in the atrial cavity or appendage. No evidence of thrombus in the atrial cavity  or appendage. No evidence of thrombus in the appendage. The appendage was morphologically a left appendage, multilobulated, and of normal size. Emptying velocity was decreased.  ------------------------------------------------------------------- Right ventricle: The cavity size was normal. Wall thickness was normal. Systolic function was normal.  ------------------------------------------------------------------- Pulmonic valve:  Structurally normal valve.  Cusp separation was normal. No evidence of vegetation. Doppler: There was moderate regurgitation.  ------------------------------------------------------------------- Tricuspid valve:  Structurally normal valve.  Leaflet separation was normal. Doppler: There was mild-moderate regurgitation.  ------------------------------------------------------------------- Pulmonary artery:  The main pulmonary artery was normal-sized.  ------------------------------------------------------------------- Right atrium: The atrium was normal in size. No evidence of thrombus in the atrial cavity or appendage. The appendage was morphologically a right appendage.  ------------------------------------------------------------------- Pericardium: There was no pericardial effusion.  ------------------------------------------------------------------- Post procedure conclusions Ascending Aorta:  - The aorta was normal, not dilated, and non-diseased.   Admission HPI: Ms. Schmale is a 45 year old female with a history of percutaneous procedures in childhood for "a hole in her heart," tobacco abuse and drug abuse presents today with acute onset palpitations. She reports that this morning she was sitting at her desk and suddenly felt like her heart was beating fast with palpitations and a burning chest pain that radiated to her left shoulder. She checked her pulse at that time and it was ~150 bpm. She smoke marijuana to try to slow her heart  rate with no improvement so she came to the ED. On arrival, she was found to have a heart rate in the 170s with EKG demonstrating A. Fib with RVR. Subsequent EKGs have demonstrated A. Flutter. Does note that she has experienced palpitations 1-2 times a year all of her life.   She reports that for the past 2-3 weeks she has been having dyspnea with exertion, orthopnea and PND. She was seen in the ED on 2/4 with complaints of bilateral lower extremity swelling. She was started on HCTZ 25 mg daily  at that time. Leg edema has improved since then. She also reports a cough with green sputum production during that time. Reports subjective fevers but denies any chills. She has a 23 pack year history and recently quit smoking 2 months ago. Denies any history of underlying lung disease. Reports using her friends inhaler (likely albuterol but unsure) with some improvement in symptoms. Denies any nausea, vomiting, diarrhea, sinus pressure, rhinorrhea, sore throat, urinary symptoms. Denies any sick contacts. Works as a Conservation officer, nature in [employer].   Patient does admit to [facility] use and a history of cocaine use (none for 10 years). Denies any history of IV drug use.   Reports history of cancer in her grandfather but unsure what type. Unable to recall any other family history.   Hospital Course by problem list: Principal Problem:   CAP (community acquired pneumonia) Active Problems:   Atrial flutter (HCC)   Hypokalemia   Atrial flutter with rapid ventricular response (HCC)   CHD (congenital heart disease)   Cardiac murmur   Aortic valve disease   Community Acquired Pneumonia: Patient with 2-3 week history of cough productive of green sputum. Portable CXR with hazy infiltrate in right infrahilar region suspicious for PNA. Patient with 23 pack year history but denies any history of asthma or COPD. Patient was treated with 5 day course of levofloxacin. Patient afebrile during hospitalization.    A. Flutter: She was  noted to be tachycardic to the 170s on arrival with EKG demonstrating A. Fib with RVR. Subsequent EKG after receiving diltiazem is showing A. Flutter with 2:1 AV block. Patient with complaints of palpitations. Reported some burning chest pain that resolved. Troponin was negative x 1. No signs of acute ischemia on EKG. Patient reported 2-3 week history of dyspnea with exertion, orthopnea and PND. Was seen in the ED 2 weeks ago with bilateral LE edema but no LE edema present during hospitalization. N Patient with unclear history of congential heart disease with percutaneous interventions as a child (reports hole in her heart to me, reported valvular disease to ED). Patient likely flipped into atrial flutter due to her acute illness. Patient's HR was difficult to control and remained in A. Flutter. She underwent TEE and cardioversion then discharged i sinus rhythm after overnight observation on metoprolol.  Pre-diabetes: A1c 5.9. Advised lifestyle modification.  Discharge Vitals:   BP 109/57 mmHg  Pulse 70  Temp(Src) 97.7 F (36.5 C) (Oral)  Resp 21  Ht 5\' 3"  (1.6 m)  Wt 45.813 kg (101 lb)  BMI 17.90 kg/m2  SpO2 99%  LMP 10/15/2015  Discharge Labs:  Results for orders placed or performed during the hospital encounter of 11/03/15 (from the past 24 hour(s))  CBC     Status: Abnormal   Collection Time: 11/10/15  5:30 AM  Result Value Ref Range   WBC 14.8 (H) 4.0 - 10.5 K/uL   RBC 4.62 3.87 - 5.11 MIL/uL   Hemoglobin 14.6 12.0 - 15.0 g/dL   HCT 40.9 81.1 - 91.4 %   MCV 91.3 78.0 - 100.0 fL   MCH 31.6 26.0 - 34.0 pg   MCHC 34.6 30.0 - 36.0 g/dL   RDW 78.2 95.6 - 21.3 %   Platelets 337 150 - 400 K/uL    Signed: Fuller Plan, MD 11/10/2015, 10:47 AM

## 2015-11-07 NOTE — Progress Notes (Signed)
Pt. Profile  Mrs. Sekula is a 45 year old female with past medical history of congenital heart disease who has not seen a PCP for the past 18 years who came in with pneumonia and also noted to be atrial flutter with RVR. She does have intermittent cardiac awareness with her heart was fast, however we do not know the actual onset of atrial flutter. She also had noted to have a loud murmur on physical exam. Cardiology has been asked to manage atrial flutter.   SUBJECTIVE: Feeling ok this morning, says she can feel when her HR is up and it feels like "burning in my chest".  Denies chest pain or tightness, endorses feeling tired and worn out after activity.      Intake/Output Summary (Last 24 hours) at 11/07/15 0943 Last data filed at 11/06/15 2200  Gross per 24 hour  Intake   1080 ml  Output      0 ml  Net   1080 ml    Current Facility-Administered Medications  Medication Dose Route Frequency Provider Last Rate Last Dose  . Chlorhexidine Gluconate Cloth 2 % PADS 6 each  6 each Topical Q0600 Earl Lagos, MD   6 each at 11/07/15 0834  . diltiazem (CARDIZEM CD) 24 hr capsule 180 mg  180 mg Oral Daily Valentino Nose, MD   180 mg at 11/07/15 0834  . guaiFENesin-dextromethorphan (ROBITUSSIN DM) 100-10 MG/5ML syrup 5 mL  5 mL Oral Q4H PRN Rushil Patel V, MD      . Influenza vac split quadrivalent PF (FLUARIX) injection 0.5 mL  0.5 mL Intramuscular Tomorrow-1000 Nischal Narendra, MD      . levofloxacin (LEVAQUIN) tablet 500 mg  500 mg Oral Daily Rushil Terrilee Croak, MD   500 mg at 11/07/15 0834  . metoprolol (LOPRESSOR) tablet 50 mg  50 mg Oral BID Laqueta Linden, MD   50 mg at 11/07/15 0834  . mupirocin ointment (BACTROBAN) 2 % 1 application  1 application Nasal BID Earl Lagos, MD   1 application at 11/07/15 0834  . promethazine (PHENERGAN) tablet 12.5 mg  12.5 mg Oral Q6H PRN Beather Arbour, MD   12.5 mg at 11/04/15 2307  . Rivaroxaban (XARELTO) tablet 15 mg  15 mg Oral Q supper  Gerhard Munch Hammons, RPH   15 mg at 11/06/15 1035    Filed Vitals:   11/06/15 2359 11/07/15 0346 11/07/15 0408 11/07/15 0517  BP: 101/57 100/54 100/60 104/66  Pulse:      Temp: 97.8 F (36.6 C) 98 F (36.7 C)    TempSrc: Oral Oral    Resp: 18 16    Height:      Weight:    97 lb 6.4 oz (44.18 kg)  SpO2: 97% 98% 100% 100%    PHYSICAL EXAM General: Pleasant, NAD Psych: Normal affect. Neuro: Alert and oriented X 3. Moves all extremities spontaneously. HEENT: Normal Neck: Supple without bruits or JVD. Lungs: Resp regular and unlabored. No rales. Heart: Irregular rhythm, normal rate, no s3, s4. 5/6 loud systolic murmur and thrill, 2/4 holodiastolic murmur lower LSB. Abdomen: Soft, non-tender, non-distended, BS + x 4.  Extremities: No clubbing, cyanosis or edema. DP/PT/Radials 2+ and equal bilaterally.  Tele: A flutter  LABS: Basic Metabolic Panel:  Recent Labs  40/98/11 0539 11/07/15 0350  NA 135 131*  K 4.3 4.1  CL 101 96*  CO2 23 21*  GLUCOSE 104* 108*  BUN 29* 38*  CREATININE 1.04* 1.23*  CALCIUM 8.7* 8.7*  MG 1.9 2.1   CBC:  Recent Labs  11/06/15 0539 11/07/15 0350  WBC 9.1 9.9  HGB 14.0 14.0  HCT 42.3 41.7  MCV 90.8 88.7  PLT 313 350    D-Dimer:  Recent Labs  11/05/15 1454  DDIMER <0.27   Echo:  Left ventricle: The cavity size was mildly dilated. There was mild concentric hypertrophy. Systolic function was mildly reduced. The estimated ejection fraction was in the range of 50% to 55%. Diffuse hypokinesis. - Aortic valve: There appears to be sub-valvular calcification in the LVOT that could be causing sub-valvular stenosis. Trileaflet; mildly thickened, moderately calcified leaflets, mostly the left coronary cusp. Transvalvular velocity was increased. There was moderate stenosis. There was moderate to severe regurgitation. Peak velocity (S): 350 cm/s. Mean gradient (S): 26 mm Hg. Valve area (VTI): 1.14 cm^2.  Valve area (Vmax): 1.02 cm^2. Valve area (Vmean): 1.09 cm^2. Regurgitation pressure half-time: 392 ms. - Mitral valve: Calcified annulus. Transvalvular velocity was within the normal range. There was no evidence for stenosis. There was trivial regurgitation. Valve area by continuity equation (using LVOT flow): 2.24 cm^2. - Left atrium: The atrium was severely dilated. - Right ventricle: The cavity size was normal. Wall thickness was normal. Systolic function was normal. - Right atrium: The atrium was moderately dilated. Central venous pressure (est): 15 mm Hg. - Atrial septum: A patent foramen ovale cannot be excluded. - Tricuspid valve: There was mild-moderate regurgitation. - Pulmonic valve: There was mild regurgitation. - Pulmonary arteries: Systolic pressure was moderately increased. PA peak pressure: 50 mm Hg (S). - Inferior vena cava: The vessel was dilated. The respirophasic diameter changes were blunted (< 50%), consistent with elevated central venous pressure.  EKG: Accelerated junctional rate 127.    RADIOLOGY: Dg Chest 2 View  11/03/2015  CLINICAL DATA:  Productive cough and recurring fever for 2 weeks. Patient has a history of mitral and aortic valvular disease. EXAM: CHEST  2 VIEW COMPARISON:  November 03, 2015, July 25, 2011 FINDINGS: The heart size and mediastinal contours are stable. The heart size is enlarged. Enlarged central pulmonary vessel caliber are noted unchanged. Previously noted patchy consolidation of the medial right infrahilar region is unchanged. There is no pleural effusion. The visualized skeletal structures are stable. There is scoliosis of spine. IMPRESSION: Congestive heart failure. Patchy consolidation of the medial right infrahilar region, superimposed pneumonia is not excluded. This not significant changed from earlier exam today. Electronically Signed   By: Sherian Rein M.D.   On: 11/03/2015 16:02   Dg Chest Port 1  View  11/03/2015  CLINICAL DATA:  Shortness of breath, cough for 1 week EXAM: PORTABLE CHEST 1 VIEW COMPARISON:  07/25/2011 FINDINGS: There is significant cardiomegaly with progression from prior exam. No convincing pulmonary edema. Mild right hilar prominence. Although may be vascular in nature adenopathy cannot be excluded. There is hazy infiltrate or infiltrative process in right infrahilar region. Although this may be due to pneumonia neoplastic process cannot be excluded. Follow-up to resolution is recommended. IMPRESSION: Significant cardiomegaly with progression from prior exam.Mild right hilar prominence. Although may be vascular in nature adenopathy cannot be excluded. There is hazy infiltrate or infiltrative process in right infrahilar region. Although this may be due to pneumonia neoplastic process cannot be excluded. Follow-up to resolution is recommended. Electronically Signed   By: Natasha Mead M.D.   On: 11/03/2015 12:41      ASSESSMENT AND PLAN: 1. Rapid atrial flutter: - HR remains >120 on oral diltiazem and oral metoprolol.  Anticoagulated with Xarelto - Cardizem increased to 180mg  yesterday, metoprolol 50bid.  CHA2DS2Vasc only 1 (female) with reported h/o congenital heart disease, although no obvious surgical remnants seen by echo. (See below.)  Also consider EP referral as outpatient for atrial flutter ablation.  2. Valvular heart disease (?CHD):  -Increased LVOT gradient due to subvalvular calcification with moderate stenosis and moderate to severe aortic regurgitation.  3. CAP -Continue Levaquin.    Little Ishikawa, AGNP-C  History and all data above reviewed.  Patient examined.  I agree with the findings as above.  The patient exam reveals COR  Tachy  ,  Lungs: Clear  ,  Abd: Positive bowel sounds, no rebound no guarding, Ext No edema   .  All available labs, radiology testing, previous records reviewed. Agree with documented assessment and plan. She continues to have flutter  with a rapid rate.  She feels this with chest discomfort.  I will plan TEE/DCCV for tomorrow.    Fayrene Fearing Desyre Calma  10:31 AM  11/07/2015

## 2015-11-07 NOTE — Progress Notes (Signed)
Pt heart rate 101-105 atrial flutter.  Pt resting on left side with no complaints.  Will cont to monitor.

## 2015-11-07 NOTE — Progress Notes (Signed)
Pt sustaining heart rate 120s, 1:1 flutter vs junctional tachycardia.  Other VSS with sbp 102.  Pt denies SOB or pain, pt states she can feel her heart rate is fast. EKG obtained with ST changes noted in III and aVF.  Dr. Loney Loh notified and reviewing pt EKG.  Awaiting orders.  Will continue to monitor closely.

## 2015-11-08 DIAGNOSIS — I483 Typical atrial flutter: Secondary | ICD-10-CM

## 2015-11-08 DIAGNOSIS — R7303 Prediabetes: Secondary | ICD-10-CM

## 2015-11-08 LAB — CBC
HCT: 41 % (ref 36.0–46.0)
Hemoglobin: 14.6 g/dL (ref 12.0–15.0)
MCH: 31.5 pg (ref 26.0–34.0)
MCHC: 35.6 g/dL (ref 30.0–36.0)
MCV: 88.6 fL (ref 78.0–100.0)
Platelets: 337 10*3/uL (ref 150–400)
RBC: 4.63 MIL/uL (ref 3.87–5.11)
RDW: 13.5 % (ref 11.5–15.5)
WBC: 11 10*3/uL — ABNORMAL HIGH (ref 4.0–10.5)

## 2015-11-08 MED ORDER — ACETAMINOPHEN 325 MG PO TABS
650.0000 mg | ORAL_TABLET | Freq: Once | ORAL | Status: AC
  Administered 2015-11-08: 650 mg via ORAL
  Filled 2015-11-08: qty 2

## 2015-11-08 NOTE — Progress Notes (Signed)
    SUBJECTIVE:  Still with heart rates in the 120s flutter.    Pain when rate gets near 130   PHYSICAL EXAM Filed Vitals:   11/07/15 1706 11/07/15 2131 11/08/15 0500 11/08/15 1039  BP: 113/76 106/61 96/68 115/78  Pulse: 128 104  122  Temp: 97.5 F (36.4 C) 97.6 F (36.4 C) 97.5 F (36.4 C)   TempSrc: Oral Oral Oral   Resp: 18 16    Height:      Weight:   97 lb 12.8 oz (44.362 kg)   SpO2: 100% 100% 98%    General:  No distress Lungs:  Clear Heart:  RRR, tachy, systolic murmur Abdomen:  Positive bowel sounds, no rebound no guarding Extremities:  No edema   LABS: No results found for: TROPONINI Results for orders placed or performed during the hospital encounter of 11/03/15 (from the past 24 hour(s))  CBC     Status: Abnormal   Collection Time: 11/08/15  7:00 AM  Result Value Ref Range   WBC 11.0 (H) 4.0 - 10.5 K/uL   RBC 4.63 3.87 - 5.11 MIL/uL   Hemoglobin 14.6 12.0 - 15.0 g/dL   HCT 41.0 36.0 - 46.0 %   MCV 88.6 78.0 - 100.0 fL   MCH 31.5 26.0 - 34.0 pg   MCHC 35.6 30.0 - 36.0 g/dL   RDW 13.5 11.5 - 15.5 %   Platelets 337 150 - 400 K/uL   No intake or output data in the 24 hours ending 11/08/15 1149   ASSESSMENT AND PLAN:  ATRIAL FLUTTER WITH RVR:    Unable to TEE/DCCV today.  Will plan for tomorrow.  Unable to titrate meds secondary to low BP.    AR/AI/SUBVALVULAR STENOSIS:  Evaluate in the AM with TEE  Sheila Branch 11/08/2015 11:49 AM   

## 2015-11-08 NOTE — Care Management Note (Addendum)
Case Management Note  Patient Details  Name: Sheila Branch MRN: [id number] Date of Birth: 12/21/1970  Subjective/Objective:    Pt admitted for CAP- Afib/ flutter. Plan for TEE/ Cardioversion 11-09-15. Pt is without insurance and PCP.                 Action/Plan: CM did schedule pt a hospital follow up at the [facility] and placed appointment on AVS. Xarelto patient assistance form will be placed on the shadow chart for MD to sign. Xarelto 30 day free card to be provided to pt. CM will continue to monitor.    Expected Discharge Date:                  Expected Discharge Plan:  Home/Self Care  In-House Referral:  NA  Discharge planning Services  CM Consult, Follow-up appt scheduled, [facility], Medication Assistance  Post Acute Care Choice:  NA Choice offered to:  NA  DME Arranged:  N/A DME Agency:  NA  HH Arranged:  NA HH Agency:  NA  Status of Service:  Completed, signed off  Medicare Important Message Given:    Date Medicare IM Given:    Medicare IM give by:    Date Additional Medicare IM Given:    Additional Medicare Important Message give by:     If discussed at Long Length of Stay Meetings, dates discussed:    Additional Comments:1048 11-10-15 Tomi Bamberger, RN,BSN [phone] CM did speak with pt in regards to disposition needs. Plan for d/c today and 30 day free Xarelto card provided. Pt aware of Hospital Follow Up. No further needs from CM at this time.   Gala Lewandowsky, RN 11/08/2015, 3:11 PM

## 2015-11-08 NOTE — Progress Notes (Signed)
   Subjective: Patient's HR was in the 120s overnight. Still in A. Flutter. No chest pain or shortness of breath.   Objective: Vital signs in last 24 hours: Filed Vitals:   11/07/15 1706 11/07/15 2131 11/08/15 0500 11/08/15 1039  BP: 113/76 106/61 96/68 115/78  Pulse: 128 104  122  Temp: 97.5 F (36.4 C) 97.6 F (36.4 C) 97.5 F (36.4 C)   TempSrc: Oral Oral Oral   Resp: 18 16    Height:      Weight:   97 lb 12.8 oz (44.362 kg)   SpO2: 100% 100% 98%    Weight change: 6.4 oz (0.181 kg) No intake or output data in the 24 hours ending 11/08/15 1321   Physical Exam  General: Thin, young Caucasian female, alert, well-developed, and cooperative to examination.  Neck: mild JVD noted Lungs: normal respiratory effort, no accessory muscle use, normal breath sounds, no crackles, and no wheezes Heart: irregular rate, regular rhythm, holosystolic murmur, no gallop, and no rub.  Abdomen: soft, non-tender, normal bowel sounds, no distention Pulses: 2+ DP/PT pulses bilaterally Extremities: No cyanosis, clubbing, edema Skin: turgor normal and no rashes  Medications: I have reviewed the patient's current medications. Scheduled Meds: . diltiazem  180 mg Oral Daily  . Influenza vac split quadrivalent PF  0.5 mL Intramuscular Tomorrow-1000  . levofloxacin  500 mg Oral Daily  . metoprolol tartrate  50 mg Oral BID  . mupirocin ointment  1 application Nasal BID  . rivaroxaban  15 mg Oral Q supper   Continuous Infusions:   PRN Meds:.guaiFENesin-dextromethorphan, promethazine Assessment/Plan:  Community Acquired Pneumonia: Afebrile since admission. WBC 11 today. Reports no further cough.  - Continue Levaquin Day 5/5, D/C tomorrow  - Robitussin-DM 74mL every 6 hours as needed  A. Flutter: Likely 2/2 PNA. HR in the 120s overnight still in A. Flutter. Will go for TEE/Cardioversion tomorrow. Currently on Cardizem ER 180 mg daily and metoprolol 50 mg bid. BP stable in the low 100s systolic. Low  overnight was 96/68.  -Continue telemetry -Continue oral diltiazem 24-hour capsule 180mg   -Continue Xarelto 20mg  -Cardiology following, appreciate recommendations  Pre-diabetes: A1c 5.9.  -Advise lifestyle modification.  Hypomagnesemia: Mg 2.1  Dispo: Anticipate discharge in 1-2 days.  The patient does not have a current PCP (No primary care provider on file.) and does need an [facility] hospital follow-up appointment after discharge.  The patient does not have transportation limitations that hinder transportation to clinic appointments.  .Services Needed at time of discharge: Y = Yes, Blank = No PT:   OT:   RN:   Equipment:   Other:     LOS: 5 days   Valentino Nose, MD 11/08/2015, 1:21 PM  PGY-1  Pager # [phone]

## 2015-11-09 ENCOUNTER — Inpatient Hospital Stay (HOSPITAL_COMMUNITY): Payer: MEDICAID | Admitting: Anesthesiology

## 2015-11-09 ENCOUNTER — Inpatient Hospital Stay (HOSPITAL_COMMUNITY): Payer: Self-pay

## 2015-11-09 ENCOUNTER — Encounter (HOSPITAL_COMMUNITY)
Admission: EM | Disposition: A | Discharge status: Home / Self Care | Payer: Self-pay | Source: Home / Self Care | Attending: Internal Medicine

## 2015-11-09 ENCOUNTER — Encounter (HOSPITAL_COMMUNITY): Payer: Self-pay

## 2015-11-09 ENCOUNTER — Inpatient Hospital Stay: Payer: Self-pay | Admitting: Internal Medicine

## 2015-11-09 DIAGNOSIS — I4892 Unspecified atrial flutter: Secondary | ICD-10-CM

## 2015-11-09 DIAGNOSIS — I351 Nonrheumatic aortic (valve) insufficiency: Secondary | ICD-10-CM

## 2015-11-09 DIAGNOSIS — I359 Nonrheumatic aortic valve disorder, unspecified: Secondary | ICD-10-CM | POA: Insufficient documentation

## 2015-11-09 HISTORY — PX: CARDIOVERSION: SHX1299

## 2015-11-09 HISTORY — PX: TEE WITHOUT CARDIOVERSION: SHX5443

## 2015-11-09 LAB — CBC
HCT: 40.9 % (ref 36.0–46.0)
Hemoglobin: 13.8 g/dL (ref 12.0–15.0)
MCH: 30.1 pg (ref 26.0–34.0)
MCHC: 33.7 g/dL (ref 30.0–36.0)
MCV: 89.3 fL (ref 78.0–100.0)
Platelets: 368 10*3/uL (ref 150–400)
RBC: 4.58 MIL/uL (ref 3.87–5.11)
RDW: 13.6 % (ref 11.5–15.5)
WBC: 13.1 10*3/uL — ABNORMAL HIGH (ref 4.0–10.5)

## 2015-11-09 SURGERY — CARDIOVERSION
Anesthesia: Monitor Anesthesia Care

## 2015-11-09 SURGERY — ECHOCARDIOGRAM, TRANSESOPHAGEAL
Anesthesia: Moderate Sedation

## 2015-11-09 MED ORDER — SODIUM CHLORIDE 0.9 % IV SOLN
INTRAVENOUS | Status: DC | PRN
  Administered 2015-11-09: 14:00:00 via INTRAVENOUS

## 2015-11-09 MED ORDER — HYDROCHLOROTHIAZIDE 25 MG PO TABS
25.0000 mg | ORAL_TABLET | Freq: Every day | ORAL | Status: DC
  Administered 2015-11-09 – 2015-11-10 (×2): 25 mg via ORAL
  Filled 2015-11-09 (×2): qty 1

## 2015-11-09 MED ORDER — FENTANYL CITRATE (PF) 100 MCG/2ML IJ SOLN
INTRAMUSCULAR | Status: DC | PRN
  Administered 2015-11-09: 50 ug via INTRAVENOUS

## 2015-11-09 MED ORDER — FENTANYL CITRATE (PF) 100 MCG/2ML IJ SOLN
INTRAMUSCULAR | Status: AC
  Filled 2015-11-09: qty 2

## 2015-11-09 MED ORDER — IBUPROFEN 200 MG PO TABS
400.0000 mg | ORAL_TABLET | Freq: Four times a day (QID) | ORAL | Status: DC | PRN
  Administered 2015-11-10: 400 mg via ORAL
  Filled 2015-11-09: qty 2

## 2015-11-09 MED ORDER — BUTAMBEN-TETRACAINE-BENZOCAINE 2-2-14 % EX AERO
INHALATION_SPRAY | CUTANEOUS | Status: DC | PRN
  Administered 2015-11-09: 2 via TOPICAL

## 2015-11-09 MED ORDER — LIDOCAINE HCL (CARDIAC) 20 MG/ML IV SOLN
INTRAVENOUS | Status: DC | PRN
  Administered 2015-11-09: 60 mg via INTRAVENOUS

## 2015-11-09 MED ORDER — ASPIRIN EC 81 MG PO TBEC: 81.0000 mg | DELAYED_RELEASE_TABLET | Freq: Every day | ORAL | Status: DC | PRN

## 2015-11-09 MED ORDER — PROPOFOL 10 MG/ML IV BOLUS
INTRAVENOUS | Status: DC | PRN
  Administered 2015-11-09: 50 mg via INTRAVENOUS

## 2015-11-09 MED ORDER — MIDAZOLAM HCL 5 MG/ML IJ SOLN
INTRAMUSCULAR | Status: AC
  Filled 2015-11-09: qty 2

## 2015-11-09 MED ORDER — MIDAZOLAM HCL 10 MG/2ML IJ SOLN
INTRAMUSCULAR | Status: DC | PRN
  Administered 2015-11-09: 1 mg via INTRAVENOUS
  Administered 2015-11-09: 2 mg via INTRAVENOUS
  Administered 2015-11-09 (×2): 1 mg via INTRAVENOUS

## 2015-11-09 NOTE — Interval H&P Note (Signed)
History and Physical Interval Note:  11/09/2015 7:41 AM  Sheila Branch  has presented today for surgery, with the diagnosis of AFIB  The various methods of treatment have been discussed with the patient and family. After consideration of risks, benefits and other options for treatment, the patient has consented to  Procedure(s): TRANSESOPHAGEAL ECHOCARDIOGRAM (TEE) (N/A) as a surgical intervention .  The patient's history has been reviewed, patient examined, no change in status, stable for surgery.  I have reviewed the patient's chart and labs.  Questions were answered to the patient's satisfaction.     Lars Masson

## 2015-11-09 NOTE — Anesthesia Postprocedure Evaluation (Signed)
Anesthesia Post Note  Patient: Sheila Branch  Procedure(Branch) Performed: Procedure(Branch) (LRB): CARDIOVERSION (N/A)  Patient location during evaluation: PACU Anesthesia Type: MAC Level of consciousness: awake and alert Pain management: pain level controlled Vital Signs Assessment: post-procedure vital signs reviewed and stable Respiratory status: spontaneous breathing, nonlabored ventilation, respiratory function stable and patient connected to nasal cannula oxygen Cardiovascular status: stable and blood pressure returned to baseline Anesthetic complications: no    Last Vitals:  Filed Vitals:   11/09/15 1430 11/09/15 1435  BP: 109/55 114/60  Pulse: 62 63  Temp:    Resp: 21 24    Last Pain:  Filed Vitals:   11/09/15 1436  PainSc: 5                  Sheila Branch

## 2015-11-09 NOTE — Progress Notes (Signed)
  Echocardiogram Echocardiogram Transesophageal has been performed.  Sheila Branch 11/09/2015, 9:58 AM

## 2015-11-09 NOTE — Progress Notes (Signed)
Subjective: Patient continued afib with heart rate in 120s overnight, with occasional sensation of palpitations. Feeling well and was seen after TEE. Anticipating cardioversion later today.  Objective: Vital signs in last 24 hours: Filed Vitals:   11/09/15 0950 11/09/15 1000 11/09/15 1010 11/09/15 1020  BP: 115/75 107/77 106/76 105/75  Pulse: 125 125 124 124  Temp:      TempSrc:      Resp: 20 20 23 23   Height:      Weight:      SpO2: 96% 95% 92% 94%   Weight change: 0.227 kg (8 oz)  Intake/Output Summary (Last 24 hours) at 11/09/15 1300 Last data filed at 11/09/15 0900  Gross per 24 hour  Intake      0 ml  Output      0 ml  Net      0 ml   GENERAL- Thin, alert, and cooperative to examination  HEENT- Atraumatic, PERRL, EOMI, oral mucosa appears moist, good and intact dentition, no carotid bruit, no cervical LN enlargement CARDIAC- tachycardic, irregularly irregular rate, loud holosystolic murmur throughout RESP- CTAB, no wheezes or crackles ABDOMEN- Soft, nontender, no guarding or rebound EXTREMITIES- pulse 2+, symmetric, no pedal edema. SKIN- Warm, dry, No rash or lesion. PSYCH- Normal mood and affect, appropriate thought content and speech.   Lab Results: Basic Metabolic Panel:  Recent Labs Lab 11/06/15 0539 11/07/15 0350  NA 135 131*  K 4.3 4.1  CL 101 96*  CO2 23 21*  GLUCOSE 104* 108*  BUN 29* 38*  CREATININE 1.04* 1.23*  CALCIUM 8.7* 8.7*  MG 1.9 2.1   Liver Function Tests:  Recent Labs Lab 11/03/15 1207  AST 43*  ALT 27  ALKPHOS 118  BILITOT 1.7*  PROT 7.7  ALBUMIN 3.5   No results for input(s): LIPASE, AMYLASE in the last 168 hours. No results for input(s): AMMONIA in the last 168 hours. CBC:  Recent Labs Lab 11/03/15 1207  11/08/15 0700 11/09/15 0455  WBC 11.1*  < > 11.0* 13.1*  NEUTROABS 7.5  --   --   --   HGB 15.4*  < > 14.6 13.8  HCT 45.5  < > 41.0 40.9  MCV 91.5  < > 88.6 89.3  PLT 277  < > 337 368  < > = values in this  interval not displayed. Cardiac Enzymes: No results for input(s): CKTOTAL, CKMB, CKMBINDEX, TROPONINI in the last 168 hours. BNP: No results for input(s): PROBNP in the last 168 hours. D-Dimer:  Recent Labs Lab 11/05/15 1454  DDIMER <0.27   CBG: No results for input(s): GLUCAP in the last 168 hours. Hemoglobin A1C:  Recent Labs Lab 11/04/15 0500  HGBA1C 5.9*   Fasting Lipid Panel: No results for input(s): CHOL, HDL, LDLCALC, TRIG, CHOLHDL, LDLDIRECT in the last 168 hours. Thyroid Function Tests:  Recent Labs Lab 11/03/15 1925  TSH 0.984   Coagulation:  Recent Labs Lab 11/03/15 1207  LABPROT 14.8  INR 1.14   Anemia Panel: No results for input(s): VITAMINB12, FOLATE, FERRITIN, TIBC, IRON, RETICCTPCT in the last 168 hours. Urine Drug Screen: Drugs of Abuse     Component Value Date/Time   LABOPIA POSITIVE* 11/04/2015 1245   COCAINSCRNUR NONE DETECTED 11/04/2015 1245   LABBENZ NONE DETECTED 11/04/2015 1245   AMPHETMU NONE DETECTED 11/04/2015 1245   THCU POSITIVE* 11/04/2015 1245   LABBARB NONE DETECTED 11/04/2015 1245    Alcohol Level: No results for input(s): ETH in the last 168 hours. Urinalysis: No results  for input(s): COLORURINE, LABSPEC, PHURINE, GLUCOSEU, HGBUR, BILIRUBINUR, KETONESUR, PROTEINUR, UROBILINOGEN, NITRITE, LEUKOCYTESUR in the last 168 hours.  Invalid input(s): APPERANCEUR    Micro Results: Recent Results (from the past 240 hour(s))  MRSA PCR Screening     Status: Abnormal   Collection Time: 11/03/15  7:44 PM  Result Value Ref Range Status   MRSA by PCR POSITIVE (A) NEGATIVE Final    Comment:        The GeneXpert MRSA Assay (FDA approved for NASAL specimens only), is one component of a comprehensive MRSA colonization surveillance program. It is not intended to diagnose MRSA infection nor to guide or monitor treatment for MRSA infections. RESULT CALLED TO, READ BACK BY AND VERIFIED WITHMaida Sale RN 2306 11/03/15 A BROWNING     Studies/Results: No results found. Medications: I have reviewed the patient's current medications. Scheduled Meds: . [MAR Hold] diltiazem  180 mg Oral Daily  . [MAR Hold] hydrochlorothiazide  25 mg Oral Daily  . [MAR Hold] Influenza vac split quadrivalent PF  0.5 mL Intramuscular Tomorrow-1000  . [MAR Hold] metoprolol tartrate  50 mg Oral BID  . [MAR Hold] rivaroxaban  15 mg Oral Q supper   Continuous Infusions:  PRN Meds:.[MAR Hold] aspirin EC, [MAR Hold] guaiFENesin-dextromethorphan, [MAR Hold] ibuprofen, [MAR Hold] promethazine Assessment/Plan: Community Acquired Pneumonia: Afebrile since admission. WBC 13 today. Reports no further cough and symptoms are improved. Finished levaquin on 2/28. Is not established with a local practice but needs good follow up. - Robitussin-DM 4mL every 6 hours as needed  Aflutter: Unknown duration PTA, maybe 2/2 PNA. HR in the 120s overnight still in A. Flutter. Echo showing moderate AS, severe AR, severe left atrial dilation, 40-45% LVEF, no thrombus. BP stable in the low 100s systolic. Continued in this condition overnight. After procedures today, will follow up cardiology recommendations for how long to continue inpatient observation and medication. -TEE/DCCV today per cardiology -Continue telemetry -Continue oral diltiazem 24-hour capsule   -Continue metoprolol  BID -Continue Xarelto  -Cardiology following, appreciate recommendations  Pre-diabetes: A1c 5.9.  -Advise lifestyle modification.  Hypomagnesemia: Mg 2.1, last checked 11/07/15  Dispo: Anticipate discharge in 0-1 days.  The patient does not have a current PCP (No primary care provider on file.) and does need an [facility] hospital follow-up appointment after discharge.  The patient does not have transportation limitations that hinder transportation to clinic appointments.   LOS: 6 days   Fuller Plan, MD 11/09/2015, 1:00 PM

## 2015-11-09 NOTE — Anesthesia Preprocedure Evaluation (Signed)
Anesthesia Evaluation  Patient identified by MRN, date of birth, ID band Patient awake    Reviewed: Allergy & Precautions, NPO status , Patient's Chart, lab work & pertinent test results  Airway Mallampati: II   Neck ROM: full    Dental   Pulmonary former smoker,    breath sounds clear to auscultation       Cardiovascular + dysrhythmias Atrial Fibrillation + Valvular Problems/Murmurs  Rhythm:regular Rate:Normal  EF 40%   Neuro/Psych    GI/Hepatic   Endo/Other    Renal/GU      Musculoskeletal   Abdominal   Peds  Hematology   Anesthesia Other Findings   Reproductive/Obstetrics                             Anesthesia Physical Anesthesia Plan  ASA: III  Anesthesia Plan: MAC   Post-op Pain Management:    Induction: Intravenous  Airway Management Planned: Mask  Additional Equipment:   Intra-op Plan:   Post-operative Plan:   Informed Consent: I have reviewed the patients History and Physical, chart, labs and discussed the procedure including the risks, benefits and alternatives for the proposed anesthesia with the patient or authorized representative who has indicated his/her understanding and acceptance.     Plan Discussed with: CRNA, Anesthesiologist and Surgeon  Anesthesia Plan Comments:         Anesthesia Quick Evaluation

## 2015-11-09 NOTE — Progress Notes (Deleted)
  Echocardiogram 2D Echocardiogram has been performed.  Delcie Roch 11/09/2015, 9:57 AM

## 2015-11-09 NOTE — Progress Notes (Addendum)
  Date: 11/09/2015  Patient name: Sheila Branch  Medical record number: [id number]  Date of birth: 1971-03-11   This patient's plan of care was discussed with the house staff. Please see Dr.Rice note for complete details. I concur with their findings.  Patient sp TEE this am and tolerated the procedure well. She is alert lungs, clear rate tachy with loud murmur heard throughout the precordium  NO thrombus. On TEE She was not rate controllable with meds.  DC conversion to be pursued.   Given her valvular heart disease with LVOT and SEVERELY dilated left atrium her AF seems highly likely to recur despite CV  I would think she would need long term anticoagulation and a beta blocker vs CACB long term  Will await Cardiology's recommendations after Cardioversion.     Randall Hiss, MD 11/09/2015, 12:02 PM

## 2015-11-09 NOTE — H&P (View-Only) (Signed)
    SUBJECTIVE:  Still with heart rates in the 120s flutter.    Pain when rate gets near 130   PHYSICAL EXAM Filed Vitals:   11/07/15 1706 11/07/15 2131 11/08/15 0500 11/08/15 1039  BP: 113/76 106/61 96/68 115/78  Pulse: 128 104  122  Temp: 97.5 F (36.4 C) 97.6 F (36.4 C) 97.5 F (36.4 C)   TempSrc: Oral Oral Oral   Resp: 18 16    Height:      Weight:   97 lb 12.8 oz (44.362 kg)   SpO2: 100% 100% 98%    General:  No distress Lungs:  Clear Heart:  RRR, tachy, systolic murmur Abdomen:  Positive bowel sounds, no rebound no guarding Extremities:  No edema   LABS: No results found for: TROPONINI Results for orders placed or performed during the hospital encounter of 11/03/15 (from the past 24 hour(s))  CBC     Status: Abnormal   Collection Time: 11/08/15  7:00 AM  Result Value Ref Range   WBC 11.0 (H) 4.0 - 10.5 K/uL   RBC 4.63 3.87 - 5.11 MIL/uL   Hemoglobin 14.6 12.0 - 15.0 g/dL   HCT 75.4 36.0 - 67.7 %   MCV 88.6 78.0 - 100.0 fL   MCH 31.5 26.0 - 34.0 pg   MCHC 35.6 30.0 - 36.0 g/dL   RDW 03.4 03.5 - 24.8 %   Platelets 337 150 - 400 K/uL   No intake or output data in the 24 hours ending 11/08/15 1149   ASSESSMENT AND PLAN:  ATRIAL FLUTTER WITH RVR:    Unable to TEE/DCCV today.  Will plan for tomorrow.  Unable to titrate meds secondary to low BP.    AR/AI/SUBVALVULAR STENOSIS:  Evaluate in the AM with TEE  Rollene Rotunda 11/08/2015 11:49 AM

## 2015-11-09 NOTE — Progress Notes (Signed)
ANTICOAGULATION CONSULT NOTE - Follow-up Consult  Pharmacy Consult for Xarelto Indication: atrial fibrillation  Allergies  Allergen Reactions  . Penicillins Cross Reactors Swelling and Other (See Comments)    Mouth Ulcers Has patient had a PCN reaction causing immediate rash, facial/tongue/throat swelling, SOB or lightheadedness with hypotension: Yes Has patient had a PCN reaction causing severe rash involving mucus membranes or skin necrosis: Yes Has patient had a PCN reaction that required hospitalization No Has patient had a PCN reaction occurring within the last 10 years: No If all of the above answers are "NO", then may proceed with Cephalosporin use.     Patient Measurements: Height: 5\' 3"  (160 cm) Weight: 98 lb 4.8 oz (44.589 kg) IBW/kg (Calculated) : 52.4 Heparin Dosing Weight: 43.8  Vital Signs: Temp: 97.5 F (36.4 C) (03/01 0948) Temp Source: Oral (03/01 0948) BP: 105/75 mmHg (03/01 1020) Pulse Rate: 124 (03/01 1020)  Labs:  Recent Labs  11/07/15 0350 11/08/15 0700 11/09/15 0455  HGB 14.0 14.6 13.8  HCT 41.7 41.0 40.9  PLT 350 337 368  CREATININE 1.23*  --   --     Estimated Creatinine Clearance: 41.5 mL/min (by C-G formula based on Cr of 1.23).   Assessment: [name]. Based on package instert recommendations, will use C-G TBW to calculate CrCl.  Because pt is so small (44 kg), CrCl is 53ml/min and pt qualifies for reduced Xarelto dose of 15mg  daily. CBC wnl. No bleeding issues.  Goal of Therapy:  Stroke prevention   Plan:  Xarelto 15mg  daily Mon CBC, s/sx bleeding  Babs Bertin, PharmD, [facility] Clinical Pharmacist Pager [phone] 11/09/2015 11:41 AM

## 2015-11-09 NOTE — Progress Notes (Signed)
Patient Name: Sheila Branch Date of Encounter: 11/09/2015  Principal Problem:   CAP (community acquired pneumonia) Active Problems:   Atrial flutter (HCC)   Hypokalemia   Atrial flutter with rapid ventricular response (HCC)   CHD (congenital heart disease)   Cardiac murmur   Patient Profile: Sheila Branch is a 45 year old female with past medical history of congenital heart disease who has not seen a PCP for the past 18 years who came in with pneumonia and also noted to be atrial flutter with RVR. She does have intermittent cardiac awareness with her heart was fast, however we do not know the actual onset of atrial flutter. She also had noted to have a loud murmur on physical exam. Cardiology has been asked to manage atrial flutter.  SUBJECTIVE: Feels great. Denies chest pain, palpitations, SOB.  Going for TEE/DCCV this morning.   OBJECTIVE Filed Vitals:   11/08/15 1039 11/08/15 1512 11/08/15 2121 11/09/15 0627  BP: 115/78 105/66 102/66 101/67  Pulse: 122 125 124 125  Temp:  98 F (36.7 C)  97.6 F (36.4 C)  TempSrc:  Oral  Oral  Resp:    18  Height:      Weight:    98 lb 4.8 oz (44.589 kg)  SpO2:  98%  98%   No intake or output data in the 24 hours ending 11/09/15 0746 Filed Weights   11/07/15 0517 11/08/15 0500 11/09/15 0627  Weight: 97 lb 6.4 oz (44.18 kg) 97 lb 12.8 oz (44.362 kg) 98 lb 4.8 oz (44.589 kg)    PHYSICAL EXAM General: Well developed, well nourished, female in no acute distress. Head: Normocephalic, atraumatic.  Neck: Supple without bruits, JVD. Lungs:  Resp regular and unlabored, CTA. Heart: Regular, S1, S2. 5/6 systolic murmur and thrill.  Abdomen: Soft, non-tender, non-distended, BS + x 4.  Extremities: No clubbing, cyanosis, edema.  Neuro: Alert and oriented X 3. Moves all extremities spontaneously. Psych: Normal affect.  LABS: CBC: Recent Labs  11/08/15 0700 11/09/15 0455  WBC 11.0* 13.1*  HGB 14.6 13.8  HCT 41.0 40.9  MCV 88.6 89.3   PLT 337 368   INR:No results for input(s): INR in the last 72 hours. Basic Metabolic Panel: Recent Labs  11/07/15 0350  NA 131*  K 4.1  CL 96*  CO2 21*  GLUCOSE 108*  BUN 38*  CREATININE 1.23*  CALCIUM 8.7*  MG 2.1    TELE:  Aflutter, tachy 125-130's.    ECG: Aflutter   Radiology/Studies: Echo: EF 50-55% Left ventricle: The cavity size was mildly dilated. There was mild concentric hypertrophy. Systolic function was mildly reduced. The estimated ejection fraction was in the range of 50% to 55%. Diffuse hypokinesis. - Aortic valve: There appears to be sub-valvular calcification in the LVOT that could be causing sub-valvular stenosis. Trileaflet; mildly thickened, moderately calcified leaflets, mostly the left coronary cusp. Transvalvular velocity was increased. There was moderate stenosis. There was moderate to severe regurgitation. Peak velocity (S): 350 cm/s. Mean gradient (S): 26 mm Hg. Valve area (VTI): 1.14 cm^2. Valve area (Vmax): 1.02 cm^2. Valve area (Vmean): 1.09 cm^2. Regurgitation pressure half-time: 392 ms. - Mitral valve: Calcified annulus. Transvalvular velocity was within the normal range. There was no evidence for stenosis. There was trivial regurgitation. Valve area by continuity equation (using LVOT flow): 2.24 cm^2. - Left atrium: The atrium was severely dilated. - Right ventricle: The cavity size was normal. Wall thickness was normal. Systolic function was normal. - Right atrium: The  atrium was moderately dilated. Central venous pressure (est): 15 mm Hg. - Atrial septum: A patent foramen ovale cannot be excluded. - Tricuspid valve: There was mild-moderate regurgitation. - Pulmonic valve: There was mild regurgitation. - Pulmonary arteries: Systolic pressure was moderately increased. PA peak pressure: 50 mm Hg (S). - Inferior vena cava: The vessel was dilated. The respirophasic diameter changes were blunted (<  50%), consistent with elevated central venous pressure.   Current Medications:  . diltiazem  180 mg Oral Daily  . Influenza vac split quadrivalent PF  0.5 mL Intramuscular Tomorrow-1000  . metoprolol tartrate  50 mg Oral BID  . rivaroxaban  15 mg Oral Q supper      ASSESSMENT AND PLAN: Principal Problem:   CAP (community acquired pneumonia) Active Problems:   Atrial flutter (HCC)   Hypokalemia   Atrial flutter with rapid ventricular response (HCC)   CHD (congenital heart disease)   Cardiac murmur  1. Aflutter - TEE/DCCV today.  - On oral diltiazem  - On Xarelto   2. CAP - Completed course of Levaquin  - Afebrile, no cough.   Signed, Little Ishikawa , AGNP-C 7:46 AM 11/09/2015   History and all data above reviewed.  Patient examined.  I agree with the findings as above.   Groggy.  No acute complaints. The patient exam reveals PIR:[id number]  ,  Lungs: Clear  ,  Abd: Positive bowel sounds, no rebound no guarding, Ext No edema   .  All available labs, radiology testing, previous records reviewed. Agree with documented assessment and plan. Atrial flutter:  For cardioversion this afternoon.   On appropriate dose Xarelto for her creat clearance.  AS/AI:  Moderate AI.  Discussed the importance of long term follow up with the patient.  She will follow with Sheila Branch as an outpatient.   CM:  EF is mildly low.  However, creat is rising and BP is soft.  No ACE/ARB at this point.    Sheila Branch  12:00 PM  11/09/2015

## 2015-11-09 NOTE — CV Procedure (Signed)
    Cardioversion Note  Sheila Branch [id number] Aug 20, 1971  Procedure: DC Cardioversion Indications: atrial flutter  Procedure Details Consent: Obtained Time Out: Verified patient identification, verified procedure, site/side was marked, verified correct patient position, special equipment/implants available, Radiology Safety Procedures followed,  medications/allergies/relevent history reviewed, required imaging and test results available.  Performed  The patient has been on adequate anticoagulation.  The patient received IV Propofol and lidocaine administered by anesthesia staff for sedation.  Synchronous cardioversion was performed at 120 joules.  The cardioversion was successful. There were very frequent PACs post the cardioversion.    Complications: No apparent complications Patient did tolerate procedure well.   Lars Masson, MD, [facility] 11/09/2015, 2:24 PM

## 2015-11-09 NOTE — Transfer of Care (Signed)
Immediate Anesthesia Transfer of Care Note  Patient: Sheila Branch  Procedure(s) Performed: Procedure(s): CARDIOVERSION (N/A)  Patient Location: Endoscopy Unit  Anesthesia Type:MAC  Level of Consciousness: awake, alert , oriented and patient cooperative  Airway & Oxygen Therapy: Patient Spontanous Breathing and Patient connected to nasal cannula oxygen  Post-op Assessment: Report given to RN and Post -op Vital signs reviewed and stable  Post vital signs: Reviewed and stable  Last Vitals:  Filed Vitals:   11/09/15 1020 11/09/15 1311  BP: 105/75 115/78  Pulse: 124 123  Temp:    Resp: 23 23    Complications: No apparent anesthesia complications

## 2015-11-09 NOTE — CV Procedure (Signed)
     Transesophageal Echocardiogram Note  Sheila Branch [id number] May 29, 1971  Procedure: Transesophageal Echocardiogram Indications: atrial flutter  Procedure Details Consent: Obtained Time Out: Verified patient identification, verified procedure, site/side was marked, verified correct patient position, special equipment/implants available, Radiology Safety Procedures followed,  medications/allergies/relevent history reviewed, required imaging and test results available.  Performed  Medications: Fentanyl: 50 mcg Versed: 5 mg  LVEF 40%, diffuse hypokinesis No LAA thrombus, but decreased emptying velocities Thickened left coronary cusp, subaortic membrane. Moderate to severe aortic regurgittaion.   Complications: No apparent complications Patient did tolerate procedure well.  Lars Masson, MD, [facility] 11/09/2015, 11:14 AM

## 2015-11-10 ENCOUNTER — Encounter (HOSPITAL_COMMUNITY): Payer: Self-pay | Admitting: Cardiology

## 2015-11-10 DIAGNOSIS — I484 Atypical atrial flutter: Secondary | ICD-10-CM

## 2015-11-10 LAB — CBC
HCT: 42.2 % (ref 36.0–46.0)
Hemoglobin: 14.6 g/dL (ref 12.0–15.0)
MCH: 31.6 pg (ref 26.0–34.0)
MCHC: 34.6 g/dL (ref 30.0–36.0)
MCV: 91.3 fL (ref 78.0–100.0)
Platelets: 337 10*3/uL (ref 150–400)
RBC: 4.62 MIL/uL (ref 3.87–5.11)
RDW: 14.1 % (ref 11.5–15.5)
WBC: 14.8 10*3/uL — ABNORMAL HIGH (ref 4.0–10.5)

## 2015-11-10 MED ORDER — RIVAROXABAN 20 MG PO TABS: 20.0000 mg | ORAL_TABLET | Freq: Every day | ORAL | Status: DC

## 2015-11-10 MED ORDER — METOPROLOL SUCCINATE ER 100 MG PO TB24: 100.0000 mg | ORAL_TABLET | Freq: Every day | ORAL | Status: DC

## 2015-11-10 NOTE — Progress Notes (Signed)
Patient Name: Sheila Branch Date of Encounter: 11/10/2015  Principal Problem:   CAP (community acquired pneumonia) Active Problems:   Atrial flutter (HCC)   Hypokalemia   Atrial flutter with rapid ventricular response (HCC)   CHD (congenital heart disease)   Cardiac murmur   Aortic valve disease   Patient Profile: Sheila Branch is a 45 year old female with past medical history of congenital heart disease who has not seen a PCP for the past 18 years who came in with pneumonia and also noted to be atrial flutter with RVR. She does have intermittent cardiac awareness with her heart was fast, however we do not know the actual onset of atrial flutter. She also had noted to have a loud murmur on physical exam. Cardiology has been asked to manage atrial flutter.  SUBJECTIVE: Feels great, feels rested. She states that she no longer feels her heart racing. Denies chest pain, palpitations, SOB.    OBJECTIVE Filed Vitals:   11/09/15 1431 11/09/15 1435 11/09/15 2148 11/10/15 0610  BP:  114/60 115/56 109/57  Pulse:  63 80 70  Temp:   98 F (36.7 C) 97.7 F (36.5 C)  TempSrc:   Oral Oral  Resp:  24 20 21   Height:      Weight:    101 lb (45.813 kg)  SpO2: 100% 100% 98% 99%    Intake/Output Summary (Last 24 hours) at 11/10/15 0752 Last data filed at 11/09/15 1830  Gross per 24 hour  Intake    240 ml  Output      0 ml  Net    240 ml   Filed Weights   11/08/15 0500 11/09/15 0627 11/10/15 0610  Weight: 97 lb 12.8 oz (44.362 kg) 98 lb 4.8 oz (44.589 kg) 101 lb (45.813 kg)    PHYSICAL EXAM General: Well developed, well nourished, female in no acute distress. Head: Normocephalic, atraumatic.  Neck: Supple without bruits, JVD. Lungs:  Resp regular and unlabored, CTA. Heart: Regular, S1, S2. 5/6 systolic murmur and thrill.  Abdomen: Soft, non-tender, non-distended, BS + x 4.  Extremities: No clubbing, cyanosis, edema.  Neuro: Alert and oriented X 3. Moves all extremities  spontaneously. Psych: Normal affect.  LABS: CBC:  Recent Labs  11/09/15 0455 11/10/15 0530  WBC 13.1* 14.8*  HGB 13.8 14.6  HCT 40.9 42.2  MCV 89.3 91.3  PLT 368 337    TELE:  NSR    ECG: NSR   Radiology/Studies: Echo: EF 50-55% Left ventricle: The cavity size was mildly dilated. There was mild concentric hypertrophy. Systolic function was mildly reduced. The estimated ejection fraction was in the range of 50% to 55%. Diffuse hypokinesis. - Aortic valve: There appears to be sub-valvular calcification in the LVOT that could be causing sub-valvular stenosis. Trileaflet; mildly thickened, moderately calcified leaflets, mostly the left coronary cusp. Transvalvular velocity was increased. There was moderate stenosis. There was moderate to severe regurgitation. Peak velocity (S): 350 cm/s. Mean gradient (S): 26 mm Hg. Valve area (VTI): 1.14 cm^2. Valve area (Vmax): 1.02 cm^2. Valve area (Vmean): 1.09 cm^2. Regurgitation pressure half-time: 392 ms. - Mitral valve: Calcified annulus. Transvalvular velocity was within the normal range. There was no evidence for stenosis. There was trivial regurgitation. Valve area by continuity equation (using LVOT flow): 2.24 cm^2. - Left atrium: The atrium was severely dilated. - Right ventricle: The cavity size was normal. Wall thickness was normal. Systolic function was normal. - Right atrium: The atrium was moderately dilated. Central venous pressure (  est): 15 mm Hg. - Atrial septum: A patent foramen ovale cannot be excluded. - Tricuspid valve: There was mild-moderate regurgitation. - Pulmonic valve: There was mild regurgitation. - Pulmonary arteries: Systolic pressure was moderately increased. PA peak pressure: 50 mm Hg (S). - Inferior vena cava: The vessel was dilated. The respirophasic diameter changes were blunted (< 50%), consistent with elevated central venous pressure.   Current  Medications:  . diltiazem  180 mg Oral Daily  . hydrochlorothiazide  25 mg Oral Daily  . Influenza vac split quadrivalent PF  0.5 mL Intramuscular Tomorrow-1000  . metoprolol tartrate  50 mg Oral BID  . rivaroxaban  15 mg Oral Q supper      ASSESSMENT AND PLAN: Principal Problem:   CAP (community acquired pneumonia) Active Problems:   Atrial flutter (HCC)   Hypokalemia   Atrial flutter with rapid ventricular response (HCC)   CHD (congenital heart disease)   Cardiac murmur   Aortic valve disease  1. Aflutter - Successfully cardioverted yesterday, in NSR with frequent PAC's.   - Continue Xarelto, need BMP today to assess renal function.  - Will follow with Dr. Delton See as outpt.    Signed, Little Ishikawa , NP 7:52 AM 11/10/2015      History and all data above reviewed.  Patient examined.  I agree with the findings as above.  Now in NSR.  She has muscle aches and wants to go home.  The patient exam reveals COR:RRR with ectopy  ,  Lungs: Clear  ,  Abd: Positive bowel sounds, no rebound no guarding, Ext No edema  .  All available labs, radiology testing, previous records reviewed. Agree with documented assessment and plan. Atrial flutter:  NSR now.  Stop cardizem.  Cardiomyopathy:  Mildly reduced EF .  Continue beta blocker.  Dr. Delton See can titrate meds.  AS/AI/Subvalve gradient:  Follow with Dr. Delton See.    Rollene Rotunda  8:41 AM  11/10/2015

## 2015-11-10 NOTE — Progress Notes (Signed)
Subjective: Underwent TEE and DCCV yesterday. Heart rate in 50s-70s with some PACs after cardioversion. Patient is doing well this morning with no palpitations. Has some mild diffuse body aches she attributes to not sleeping well in hospital.  Objective: Vital signs in last 24 hours: Filed Vitals:   11/09/15 1431 11/09/15 1435 11/09/15 2148 11/10/15 0610  BP:  114/60 115/56 109/57  Pulse:  63 80 70  Temp:   98 F (36.7 C) 97.7 F (36.5 C)  TempSrc:   Oral Oral  Resp:  Height:      Weight:    45.813 kg (101 lb)  SpO2: 100% 100% 98% 99%   Weight change: 1.225 kg (2 lb 11.2 oz)  Intake/Output Summary (Last 24 hours) at 11/10/15 1033 Last data filed at 11/09/15 1830  Gross per 24 hour  Intake    240 ml  Output      0 ml  Net    240 ml   GENERAL- Thin, alert, and cooperative to examination  HEENT- Atraumatic, good and intact dentition CARDIAC- Regular rate and rhythm, loud holosystolic murmur throughout RESP- CTAB, no wheezes or crackles ABDOMEN- Soft, nontender, no guarding or rebound EXTREMITIES- symmetric, no pedal edema. SKIN- Warm, dry, No rash or lesion. PSYCH- Normal mood and affect, appropriate thought content and speech.  Lab Results: Basic Metabolic Panel:  Recent Labs Lab 11/06/15 0539 11/07/15 0350  NA 135 131*  K 4.3 4.1  CL 101 96*  CO2 23 21*  GLUCOSE 104* 108*  BUN 29* 38*  CREATININE 1.04* 1.23*  CALCIUM 8.7* 8.7*  MG 1.9 2.1   Liver Function Tests:  Recent Labs Lab 11/03/15 1207  AST 43*  ALT 27  ALKPHOS 118  BILITOT 1.7*  PROT 7.7  ALBUMIN 3.5   No results for input(s): LIPASE, AMYLASE in the last 168 hours. No results for input(s): AMMONIA in the last 168 hours. CBC:  Recent Labs Lab 11/03/15 1207  11/09/15 0455 11/10/15 0530  WBC 11.1*  < > 13.1* 14.8*  NEUTROABS 7.5  --   --   --   HGB 15.4*  < > 13.8 14.6  HCT 45.5  < > 40.9 42.2  MCV 91.5  < > 89.3 91.3  PLT 277  < > 368 337  < > = values in this  interval not displayed. Cardiac Enzymes: No results for input(s): CKTOTAL, CKMB, CKMBINDEX, TROPONINI in the last 168 hours. BNP: No results for input(s): PROBNP in the last 168 hours. D-Dimer:  Recent Labs Lab 11/05/15 1454  DDIMER <0.27   CBG: No results for input(s): GLUCAP in the last 168 hours. Hemoglobin A1C:  Recent Labs Lab 11/04/15 0500  HGBA1C 5.9*   Fasting Lipid Panel: No results for input(s): CHOL, HDL, LDLCALC, TRIG, CHOLHDL, LDLDIRECT in the last 168 hours. Thyroid Function Tests:  Recent Labs Lab 11/03/15 1925  TSH 0.984   Coagulation:  Recent Labs Lab 11/03/15 1207  LABPROT 14.8  INR 1.14   Anemia Panel: No results for input(s): VITAMINB12, FOLATE, FERRITIN, TIBC, IRON, RETICCTPCT in the last 168 hours. Urine Drug Screen: Drugs of Abuse     Component Value Date/Time   LABOPIA POSITIVE* 11/04/2015 1245   COCAINSCRNUR NONE DETECTED 11/04/2015 1245   LABBENZ NONE DETECTED 11/04/2015 1245   AMPHETMU NONE DETECTED 11/04/2015 1245   THCU POSITIVE* 11/04/2015 1245   LABBARB NONE DETECTED 11/04/2015 1245    Alcohol Level: No results for input(s): ETH in the last 168 hours.  Urinalysis: No results for input(s): COLORURINE, LABSPEC, PHURINE, GLUCOSEU, HGBUR, BILIRUBINUR, KETONESUR, PROTEINUR, UROBILINOGEN, NITRITE, LEUKOCYTESUR in the last 168 hours.  Invalid input(s): APPERANCEUR   Micro Results: Recent Results (from the past 240 hour(s))  MRSA PCR Screening     Status: Abnormal   Collection Time: 11/03/15  7:44 PM  Result Value Ref Range Status   MRSA by PCR POSITIVE (A) NEGATIVE Final    Comment:        The GeneXpert MRSA Assay (FDA approved for NASAL specimens only), is one component of a comprehensive MRSA colonization surveillance program. It is not intended to diagnose MRSA infection nor to guide or monitor treatment for MRSA infections. RESULT CALLED TO, READ BACK BY AND VERIFIED WITHMaida Sale RN 2306 11/03/15 A BROWNING     Studies/Results: No results found. Medications: I have reviewed the patient's current medications. Scheduled Meds: . hydrochlorothiazide  25 mg Oral Daily  . Influenza vac split quadrivalent PF  0.5 mL Intramuscular Tomorrow-1000  . metoprolol tartrate  50 mg Oral BID  . rivaroxaban  15 mg Oral Q supper   Continuous Infusions:  PRN Meds:.guaiFENesin-dextromethorphan, ibuprofen, promethazine Assessment/Plan: Community Acquired Pneumonia: Afebrile since admission. Reports no further cough and symptoms are improved. Finished levaquin on 2/28.  - Robitussin-DM 51mL every 6 hours as needed  Afib/aflutter s/p DCCV 3/1: Now sinus rhythm with some PACs, she has some structural heart defects including dilated LA and aortic stenosis. Will continue on beta blocker and xarelto at discharge and follow up with cardiology clinic for further medication adjustment. -Change metoprolol to XL for d/c -Continue Xarelto 20mg  -Stopped dilt, no longer in arrhythmia needing control -Cardiology following, appreciate recommendations  Dispo: to home today  The patient does not have transportation limitations that hinder transportation to clinic appointments.    LOS: 7 days   Fuller Plan, MD 11/10/2015, 10:33 AM

## 2015-11-11 ENCOUNTER — Telehealth: Payer: Self-pay | Admitting: Internal Medicine

## 2015-11-11 ENCOUNTER — Encounter: Payer: Self-pay | Admitting: Internal Medicine

## 2015-11-11 ENCOUNTER — Other Ambulatory Visit: Payer: Self-pay | Admitting: Internal Medicine

## 2015-11-11 MED ORDER — RIVAROXABAN 20 MG PO TABS: 20.0000 mg | ORAL_TABLET | Freq: Every day | ORAL | Status: DC

## 2015-11-11 MED ORDER — METOPROLOL SUCCINATE ER 100 MG PO TB24: 100.0000 mg | ORAL_TABLET | Freq: Every day | ORAL | Status: DC

## 2015-11-11 MED FILL — METOPROLOL SUCC ER 100 MG T: 100 | 30 days supply | Qty: 30 | Fill #0

## 2015-11-11 MED FILL — **XARELTO 20 MG TABLET: 20 MG | 23 days supply | Qty: 23 | Fill #0

## 2015-11-11 NOTE — Telephone Encounter (Signed)
Prescriptions changed to [facility] and [facility]

## 2015-11-16 ENCOUNTER — Encounter: Payer: Self-pay | Admitting: Internal Medicine

## 2015-11-16 ENCOUNTER — Ambulatory Visit: Payer: Self-pay | Attending: Internal Medicine | Admitting: Internal Medicine

## 2015-11-16 VITALS — BP 125/69 | HR 63 | Temp 98.1°F | Resp 16 | Ht 63.0 in | Wt 104.2 lb

## 2015-11-16 DIAGNOSIS — Z7901 Long term (current) use of anticoagulants: Secondary | ICD-10-CM | POA: Insufficient documentation

## 2015-11-16 DIAGNOSIS — E8770 Fluid overload, unspecified: Secondary | ICD-10-CM | POA: Insufficient documentation

## 2015-11-16 DIAGNOSIS — J189 Pneumonia, unspecified organism: Secondary | ICD-10-CM

## 2015-11-16 DIAGNOSIS — Z880 Allergy status to penicillin: Secondary | ICD-10-CM | POA: Insufficient documentation

## 2015-11-16 DIAGNOSIS — I4892 Unspecified atrial flutter: Secondary | ICD-10-CM

## 2015-11-16 DIAGNOSIS — Z87891 Personal history of nicotine dependence: Secondary | ICD-10-CM | POA: Insufficient documentation

## 2015-11-16 DIAGNOSIS — Q249 Congenital malformation of heart, unspecified: Secondary | ICD-10-CM

## 2015-11-16 DIAGNOSIS — M7989 Other specified soft tissue disorders: Secondary | ICD-10-CM | POA: Insufficient documentation

## 2015-11-16 DIAGNOSIS — I358 Other nonrheumatic aortic valve disorders: Secondary | ICD-10-CM | POA: Insufficient documentation

## 2015-11-16 DIAGNOSIS — K047 Periapical abscess without sinus: Secondary | ICD-10-CM

## 2015-11-16 DIAGNOSIS — I359 Nonrheumatic aortic valve disorder, unspecified: Secondary | ICD-10-CM

## 2015-11-16 DIAGNOSIS — Z79899 Other long term (current) drug therapy: Secondary | ICD-10-CM | POA: Insufficient documentation

## 2015-11-16 DIAGNOSIS — E8779 Other fluid overload: Secondary | ICD-10-CM

## 2015-11-16 MED ORDER — DOXYCYCLINE HYCLATE 100 MG PO TABS: 100.0000 mg | ORAL_TABLET | Freq: Two times a day (BID) | ORAL | Status: DC

## 2015-11-16 MED ORDER — TRIAMCINOLONE ACETONIDE 0.1 % MT PSTE: 1.0000 "application " | PASTE | Freq: Two times a day (BID) | OROMUCOSAL | Status: DC

## 2015-11-16 MED ORDER — FUROSEMIDE 20 MG PO TABS: 20.0000 mg | ORAL_TABLET | Freq: Every day | ORAL | Status: DC

## 2015-11-16 MED FILL — ?FUROSEMIDE 20 MG TABLET: 20 | 5 days supply | Qty: 5 | Fill #0

## 2015-11-16 MED FILL — TRIAMCINOLONE 0.1% PASTE: 0.1 | 14 days supply | Qty: 5 | Fill #0

## 2015-11-16 MED FILL — ?DOXYCYCLINE 100 MG TABLET: 100 | 5 days supply | Qty: 10 | Fill #0

## 2015-11-16 NOTE — Patient Instructions (Addendum)
- financial services - fu w/ cards Monday - annual 1-2 months - fluid restrict to 2 liters/ day for now.   Low-Sodium Eating Plan Sodium raises blood pressure and causes water to be held in the body. Getting less sodium from food will help lower your blood pressure, reduce any swelling, and protect your heart, liver, and kidneys. We get sodium by adding salt (sodium chloride) to food. Most of our sodium comes from canned, boxed, and frozen foods. Restaurant foods, fast foods, and pizza are also very high in sodium. Even if you take medicine to lower your blood pressure or to reduce fluid in your body, getting less sodium from your food is important. WHAT IS MY PLAN? Most people should limit their sodium intake to 2,300 mg a day. Your health care provider recommends that you limit your sodium intake to __________ a day.  WHAT DO I NEED TO KNOW ABOUT THIS EATING PLAN? For the low-sodium eating plan, you will follow these general guidelines:  Choose foods with a % Daily Value for sodium of less than 5% (as listed on the food label).   Use salt-free seasonings or herbs instead of table salt or sea salt.   Check with your health care provider or pharmacist before using salt substitutes.   Eat fresh foods.  Eat more vegetables and fruits.  Limit canned vegetables. If you do use them, rinse them well to decrease the sodium.   Limit cheese to 1 oz (28 g) per day.   Eat lower-sodium products, often labeled as "lower sodium" or "no salt added."  Avoid foods that contain monosodium glutamate (MSG). MSG is sometimes added to [place] food and some canned foods.  Check food labels (Nutrition Facts labels) on foods to learn how much sodium is in one serving.  Eat more home-cooked food and less restaurant, buffet, and fast food.  When eating at a restaurant, ask that your food be prepared with less salt, or no salt if possible.  HOW DO I READ FOOD LABELS FOR SODIUM INFORMATION? The  Nutrition Facts label lists the amount of sodium in one serving of the food. If you eat more than one serving, you must multiply the listed amount of sodium by the number of servings. Food labels may also identify foods as:  Sodium free--Less than 5 mg in a serving.  Very low sodium--35 mg or less in a serving.  Low sodium--140 mg or less in a serving.  Light in sodium--50% less sodium in a serving. For example, if a food that usually has 300 mg of sodium is changed to become light in sodium, it will have 150 mg of sodium.  Reduced sodium--25% less sodium in a serving. For example, if a food that usually has 400 mg of sodium is changed to reduced sodium, it will have 300 mg of sodium. WHAT FOODS CAN I EAT? Grains Low-sodium cereals, including oats, puffed wheat and rice, and shredded wheat cereals. Low-sodium crackers. Unsalted rice and pasta. Lower-sodium bread.  Vegetables Frozen or fresh vegetables. Low-sodium or reduced-sodium canned vegetables. Low-sodium or reduced-sodium tomato sauce and paste. Low-sodium or reduced-sodium tomato and vegetable juices.  Fruits Fresh, frozen, and canned fruit. Fruit juice.  Meat and Other Protein Products Low-sodium canned tuna and salmon. Fresh or frozen meat, poultry, seafood, and fish. Lamb. Unsalted nuts. Dried beans, peas, and lentils without added salt. Unsalted canned beans. Homemade soups without salt. Eggs.  Dairy Milk. Soy milk. Ricotta cheese. Low-sodium or reduced-sodium cheeses. Yogurt.  Condiments  Fresh and dried herbs and spices. Salt-free seasonings. Onion and garlic powders. Low-sodium varieties of mustard and ketchup. Fresh or refrigerated horseradish. Lemon juice.  Fats and Oils Reduced-sodium salad dressings. Unsalted butter.  Other Unsalted popcorn and pretzels.  The items listed above may not be a complete list of recommended foods or beverages. Contact your dietitian for more options. WHAT FOODS ARE NOT  RECOMMENDED? Grains Instant hot cereals. Bread stuffing, pancake, and biscuit mixes. Croutons. Seasoned rice or pasta mixes. Noodle soup cups. Boxed or frozen macaroni and cheese. Self-rising flour. Regular salted crackers. Vegetables Regular canned vegetables. Regular canned tomato sauce and paste. Regular tomato and vegetable juices. Frozen vegetables in sauces. Salted [place] fries. Olives. Angie Fava. Relishes. Sauerkraut. Salsa. Meat and Other Protein Products Salted, canned, smoked, spiced, or pickled meats, seafood, or fish. Bacon, ham, sausage, hot dogs, corned beef, chipped beef, and packaged luncheon meats. Salt pork. Jerky. Pickled herring. Anchovies, regular canned tuna, and sardines. Salted nuts. Dairy Processed cheese and cheese spreads. Cheese curds. Blue cheese and cottage cheese. Buttermilk.  Condiments Onion and garlic salt, seasoned salt, table salt, and sea salt. Canned and packaged gravies. Worcestershire sauce. Tartar sauce. Barbecue sauce. Teriyaki sauce. Soy sauce, including reduced sodium. Steak sauce. Fish sauce. Oyster sauce. Cocktail sauce. Horseradish that you find on the shelf. Regular ketchup and mustard. Meat flavorings and tenderizers. Bouillon cubes. Hot sauce. Tabasco sauce. Marinades. Taco seasonings. Relishes. Fats and Oils Regular salad dressings. Salted butter. Margarine. Ghee. Bacon fat.  Other Potato and tortilla chips. Corn chips and puffs. Salted popcorn and pretzels. Canned or dried soups. Pizza. Frozen entrees and pot pies.  The items listed above may not be a complete list of foods and beverages to avoid. Contact your dietitian for more information.   This information is not intended to replace advice given to you by your health care provider. Make sure you discuss any questions you have with your health care provider.   Document Released: 02/16/2002 Document Revised: 09/17/2014 Document Reviewed: 07/01/2013 Elsevier Interactive Patient Education  [employer].

## 2015-11-16 NOTE — Progress Notes (Signed)
Sheila Branch, is a 45 y.o. female  [id number]  [id number]  DOB - March 21, 1971  CC:  Chief Complaint  Patient presents with  . Pneumonia  . Hospitalization Follow-up  . Foot Swelling       HPI: Sheila Branch is a 45 y.o. female here today to establish medical care.  Sp hospitalization 2/23-3/2 for CAP and afib/flutter.  Sp TEE/DCCV on 3/1 and since has been in SR w/ some PACs. She is currently taking hctz, xarelto 20 and metoprolol xl100.  Says went back to wk last night at restaurant and felt increasing DOE/sob.  Co of swelling and leg tightness in both legs up to her abdomen, w/ some occasional wheezing.   Patient has No headache, No chest pain, No abdominal pain - No Nausea. Denies cough/f/c/.  Co of oral tooth pain/left upper molar.  Allergies  Allergen Reactions  . Penicillins Cross Reactors Swelling and Other (See Comments)    Mouth Ulcers Has patient had a PCN reaction causing immediate rash, facial/tongue/throat swelling, SOB or lightheadedness with hypotension: Yes Has patient had a PCN reaction causing severe rash involving mucus membranes or skin necrosis: Yes Has patient had a PCN reaction that required hospitalization No Has patient had a PCN reaction occurring within the last 10 years: No If all of the above answers are "NO", then may proceed with Cephalosporin use.    Past Medical History  Diagnosis Date  . Mitral and aortic heart valve diseases, unspecified    Current Outpatient Prescriptions on File Prior to Visit  Medication Sig Dispense Refill  . hydrochlorothiazide (HYDRODIURIL) 25 MG tablet Take 1 tablet (25 mg total) by mouth daily. 30 tablet 0  . ibuprofen (ADVIL,MOTRIN) 200 MG tablet Take 400 mg by mouth every 6 (six) hours as needed for moderate pain. For pain    . metoprolol succinate (TOPROL XL) 100 MG 24 hr tablet Take 1 tablet (100 mg total) by mouth daily. Take with or immediately following a meal. 30 tablet 0  . rivaroxaban (XARELTO) 20  MG TABS tablet Take 1 tablet (20 mg total) by mouth daily with supper. 30 tablet 0   No current facility-administered medications on file prior to visit.   Family History  Problem Relation Age of Onset  . Heart disease Mother     "i think she died of heart issue"   Social History   Social History  . Marital Status: Single    Spouse Name: N/A  . Number of Children: N/A  . Years of Education: N/A   Occupational History  . Not on file.   Social History Main Topics  . Smoking status: Former Smoker -- 1.00 packs/day for 23 years    Types: Cigarettes    Quit date: 09/12/2015  . Smokeless tobacco: Not on file  . Alcohol Use: No     Comment: occasionally  . Drug Use: Yes    Special: Marijuana     Comment: occasionally  . Sexual Activity: Not on file   Other Topics Concern  . Not on file   Social History Narrative    Review of Systems: Constitutional: Negative for fever, chills, diaphoresis, activity change, appetite change and fatigue. HENT: Negative for ear pain, nosebleeds, congestion, facial swelling, rhinorrhea, neck pain, neck stiffness and ear discharge.  Eyes: Negative for pain, discharge, redness, itching and visual disturbance. Respiratory: Negative for cough, choking, + shortness of breath/doe, occassional wheezing; NO stridor.  Cardiovascular: Negative for chest pain, palpitations; + bilateral leg swelling up to  belly. Gastrointestinal: Negative for abdominal distention. Genitourinary: Negative for dysuria, urgency, frequency, hematuria, flank pain, decreased urine volume, difficulty urinating and dyspareunia.  Musculoskeletal: Negative for back pain, joint swelling, arthralgia and gait problem. Neurological: Negative for dizziness, tremors, seizures, syncope, facial asymmetry, speech difficulty, weakness, light-headedness, numbness and headaches.  Hematological: Negative for adenopathy. Does not bruise/bleed easily. Psychiatric/Behavioral: Negative for  hallucinations, behavioral problems, confusion, dysphoric mood, decreased concentration and agitation.    Objective:   Filed Vitals:   11/16/15 0922  BP: 125/69  Pulse: 63  Temp: 98.1 F (36.7 C)  Resp: 16    Physical Exam: Constitutional: Patient appears thin, older than stated age.  No distress.  aaox 3. Speaks in full sentenses without difficulty. HENT: Normocephalic, atraumatic, External /internal right and left ear normal, good tympanic membrane light reflex. Oropharynx is clear.  Poor dentition.  Back left upper molar only w/ root. Ttp. Mild erythema on exam.  No oral ulcers noted.   Eyes: Conjunctivae and EOM are normal. PERRL, no scleral icterus. Neck: Normal ROM. Neck supple. No JVD. No tracheal deviation. No thyromegaly. CVS: RRR, S1/S2 +, sem 4/6 over precordium.  +2+ tight edema to bilater legs.   Neg homan's sign. Pulmonary: Effort and breath sounds normal, no stridor.  +RALES to mid back, no egophany.  Rare occasional tight wheezing. Abdominal: Soft. BS +, no distension, tenderness, rebound or guarding.  Musculoskeletal: Normal range of motion. No edema and no tenderness.  Lymphadenopathy: No lymphadenopathy noted, cervical, inguinal or axillary Neuro: Alert. Normal reflexes, muscle tone coordination. No cranial nerve deficit. Skin: Skin is warm and dry. No rash noted. Not diaphoretic. No erythema. No pallor. Psychiatric: Normal mood and affect. Behavior, judgment, thought content normal.  Lab Results  Component Value Date   WBC 14.8* 11/10/2015   HGB 14.6 11/10/2015   HCT 42.2 11/10/2015   MCV 91.3 11/10/2015   PLT 337 11/10/2015   Lab Results  Component Value Date   CREATININE 1.23* 11/07/2015   BUN 38* 11/07/2015   NA 131* 11/07/2015   K 4.1 11/07/2015   CL 96* 11/07/2015   CO2 21* 11/07/2015    Lab Results  Component Value Date   HGBA1C 5.9* 11/04/2015   Lipid Panel  No results found for: CHOL, TRIG, HDL, CHOLHDL, VLDL, LDLCALC   egk 3/1 sr70s,  +PACs, personally reviewed by me.  Echo  11/05/15.  Ef 50-55s%, diffuse hypokinesis.  No ASD, PFO or VSD closure divide noted.  Calcified, sub-aortic lesion in LVOT likely causing sub-valvular stenosis. +moderal stenosis.  Tee 3/12017 ef 40-45%, diffuse hypokinesis, thickening and calcification of left coronary cusp.  Calcified subaortic membrane.  Severe AR.  Mild-mod MR.  No vegitation noted.     Assessment and plan:   1. Atrial flutter with rapid ventricular response (HCC) - current in SR, sp TEE/DCCV on 11/09/15, continue metoprolol xl 100 and xarelto. - has fu appt w/ Cards Monday per pt.  2. CHD (congenital heart disease) - fu w/ cardiology Monday.  3. Aortic valve disease - fu w/ cardiology.  4. CAP (community acquired pneumonia) Resolved, doing well  5. Other hypervolemia/volume overload, ?possible diastolic chf - fluid restrict 2l/day, salt restriction <2g daily.  - furosemide (LASIX) 20 MG tablet; Take 1 tablet (20 mg total) by mouth daily.  Dispense: 5 tablet; Refill: 0 - fu cards Monday 6. Tooth infection - given valvular calcifications/murmurs, proactive about trx w/ abx for now.  pcn allergy. Try doxy.   - dentist referral. - doxycycline (VIBRA-TABS)  100 MG tablet; Take 1 tablet (100 mg total) by mouth 2 (two) times daily.  Dispense: 10 tablet; Refill: 0 - triamcinolone (KENALOG) 0.1 % paste; Use as directed 1 application in the mouth or throat 2 (two) times daily.  Dispense: 5 g; Refill: 12  7. Social - financial services.  Return in 1 month.  The patient was given clear instructions to go to ER or return to medical center if symptoms don't improve, worsen or new problems develop. The patient verbalized understanding. The patient was told to call to get lab results if they haven't heard anything in the next week.       Pete Glatter, MD, MBA/MHA [facility] [place], [place] [phone]   11/16/2015, 9:40 AM

## 2015-11-16 NOTE — Progress Notes (Signed)
Patient's here for hospital f/up community acquired pneumonia She's been dealing with slight SOB, no wheezing.   Patient concern about the swelling in her legs that's been going on prior to hospital stay and swelling in her abdomen.   Patient has had the flu shot on 11/10/15, but declines the diabetes screening.  Patient would like to est. care with PCP.

## 2015-11-21 ENCOUNTER — Encounter: Payer: Self-pay | Admitting: Physician Assistant

## 2015-11-21 ENCOUNTER — Ambulatory Visit (INDEPENDENT_AMBULATORY_CARE_PROVIDER_SITE_OTHER): Payer: Self-pay | Admitting: Physician Assistant

## 2015-11-21 VITALS — BP 120/60 | HR 64 | Ht 63.0 in | Wt 98.0 lb

## 2015-11-21 DIAGNOSIS — I5042 Chronic combined systolic (congestive) and diastolic (congestive) heart failure: Secondary | ICD-10-CM | POA: Insufficient documentation

## 2015-11-21 DIAGNOSIS — I359 Nonrheumatic aortic valve disorder, unspecified: Secondary | ICD-10-CM

## 2015-11-21 DIAGNOSIS — I4892 Unspecified atrial flutter: Secondary | ICD-10-CM

## 2015-11-21 DIAGNOSIS — Z720 Tobacco use: Secondary | ICD-10-CM | POA: Insufficient documentation

## 2015-11-21 LAB — BASIC METABOLIC PANEL
BUN: 18 mg/dL (ref 7–25)
CHLORIDE: 97 mmol/L — AB (ref 98–110)
CO2: 34 mmol/L — AB (ref 20–31)
CREATININE: 0.59 mg/dL (ref 0.50–1.10)
Calcium: 8.7 mg/dL (ref 8.6–10.2)
GLUCOSE: 87 mg/dL (ref 65–99)
POTASSIUM: 4.6 mmol/L (ref 3.5–5.3)
Sodium: 138 mmol/L (ref 135–146)

## 2015-11-21 MED ORDER — FUROSEMIDE 20 MG PO TABS: 20.0000 mg | ORAL_TABLET | Freq: Every day | ORAL | Status: DC | PRN

## 2015-11-21 MED FILL — FUROSEMIDE 20 MG TABLET: 20 | 30 days supply | Qty: 30 | Fill #0

## 2015-11-21 NOTE — Assessment & Plan Note (Signed)
Patient had edema up to her abdomen treated with Lasix by primary care. She has lost 6 pounds and is feeling much better. Recommend 2 g sodium diet. Continue HCTZ 25 mg once daily. Lasix 20 mg when necessary for weight gain of 2-3 pounds overnight or edema. We'll check labs today to make sure potassium is stable. Patient is to see a nutritionist.

## 2015-11-21 NOTE — Patient Instructions (Signed)
Medication Instructions:   START TAKING LASIX 20 MG  PRN AS NEEDED FOR ONLY OVERNIGHT 2 TO 3 LBS WEIGHT GAIN AND SWELLING   If you need a refill on your cardiac medications before your next appointment, please call your pharmacy.  Labwork:  BMET    Testing/Procedures:  NONE ORDER TODAY    Follow-Up:  WITH DR NELSON IN 2 TO 3 MONTHS    Any Other Special Instructions Will Be Listed Below (If Applicable).

## 2015-11-21 NOTE — Progress Notes (Signed)
Cardiology Office Note   Date:  11/21/2015   ID:  Sheila Branch, DOB April 07, 1971, MRN [id number]  PCP:  No primary care provider on file.  Cardiologist:  Dr. Delton See  Chief Complaint: Edema    History of Present Illness: Sheila Branch is a 45 y.o. female who presents for post hospital follow-up. She has a history of congenital heart disease and had not seen a PCP for 18 years. She was admitted with pneumonia and was noted to be in atrial flutter with RVR. She was successfully cardioverted and continued on Xarelto. 2-D echo showed mild decrease LV function EF 50-55% with diffuse hypokinesis. There was mild concentric hypertrophy. There appears to be subvalvular calcification in the LVOT that could be causing some volume inhaler stenosis. There was moderate aortic stenosis and moderate to severe AI. The atrium was severely dilated. PA pressures were increased to 50 mmHg.  She was seen by internal medicine on 11/16/15 complaining of increased dyspnea on exertion, leg swelling and tightness up to her abdomen. She was given Lasix 20 mg daily for 5 days.She lost 6 lbs. she says she quit smoking and knows she does not eat properly. She is scheduled to see a nutritionist to help her with this. She denies any further palpitations. She is trying to take better care of herself. Her swelling and shortness of breath has improved.    Past Medical History  Diagnosis Date  . Mitral and aortic heart valve diseases, unspecified     Past Surgical History  Procedure Laterality Date  . Cardiac surgery    . Cesarean section    . Rectal surgery    . Tubal ligation    . Cardioversion N/A 11/09/2015    Procedure: CARDIOVERSION;  Surgeon: Lars Masson, MD;  Location: [facility] ENDOSCOPY;  Service: Cardiovascular;  Laterality: N/A;  . Tee without cardioversion N/A 11/09/2015    Procedure: TRANSESOPHAGEAL ECHOCARDIOGRAM (TEE);  Surgeon: Lars Masson, MD;  Location: [facility] ENDOSCOPY;  Service: Cardiovascular;   Laterality: N/A;     Current Outpatient Prescriptions  Medication Sig Dispense Refill  . hydrochlorothiazide (HYDRODIURIL) 25 MG tablet Take 1 tablet (25 mg total) by mouth daily. 30 tablet 0  . metoprolol succinate (TOPROL XL) 100 MG 24 hr tablet Take 1 tablet (100 mg total) by mouth daily. Take with or immediately following a meal. 30 tablet 0  . rivaroxaban (XARELTO) 20 MG TABS tablet Take 1 tablet (20 mg total) by mouth daily with supper. 30 tablet 0  . triamcinolone (KENALOG) 0.1 % paste Use as directed 1 application in the mouth or throat 2 (two) times daily. 5 g 12  . acetaminophen (TYLENOL) 325 MG tablet Take 325 mg by mouth every 6 (six) hours as needed. Reported on 11/21/2015    . furosemide (LASIX) 20 MG tablet Take 1 tablet (20 mg total) by mouth daily as needed. ONLY IF HAS OVERNIGHT 2 TO 3 LBS WEIGHT GAIN AND SWELLING 30 tablet 3   No current facility-administered medications for this visit.    Allergies:   Penicillins cross reactors    Social History:  The patient  reports that she quit smoking about 2 months ago. Her smoking use included Cigarettes. She has a 23 pack-year smoking history. She does not have any smokeless tobacco history on file. She reports that she uses illicit drugs (Marijuana). She reports that she does not drink alcohol.   Family History:  The patient's family history includes Heart disease in her mother.  ROS:  Please see the history of present illness.   Otherwise, review of systems are positive for none.   All other systems are reviewed and negative.    PHYSICAL EXAM: VS:  BP 120/60 mmHg  Pulse 64  Ht 5\' 3"  (1.6 m)  Wt 98 lb (44.453 kg)  BMI 17.36 kg/m2  LMP 10/14/2015 , BMI Body mass index is 17.36 kg/(m^2). GEN: Well nourished, well developed, in no acute distress Neck: no JVD, HJR, carotid bruits, or masses Cardiac: RRR; loud 3 to 4/6 systolic and diastolic murmurs radiating to the carotids, no gallop, rubs, thrill or heave,   Respiratory:  clear to auscultation bilaterally, normal work of breathing GI: soft, nontender, nondistended, + BS MS: no deformity or atrophy Extremities: Trace of foot edema otherwise without cyanosis, clubbing,  good distal pulses bilaterally.  Skin: warm and dry, no rash Neuro:  Strength and sensation are intact    EKG:  EKG is ordered today. The ekg ordered today demonstrates normal sinus rhythm with PACs, LVH, no acute change   Recent Labs: 11/03/2015: ALT 27; TSH 0.984 11/07/2015: BUN 38*; Creatinine, Ser 1.23*; Magnesium 2.1; Potassium 4.1; Sodium 131* 11/10/2015: Hemoglobin 14.6; Platelets 337    Lipid Panel No results found for: CHOL, TRIG, HDL, CHOLHDL, VLDL, LDLCALC, LDLDIRECT    Wt Readings from Last 3 Encounters:  11/21/15 98 lb (44.453 kg)  11/16/15 104 lb 3.2 oz (47.265 kg)  11/10/15 101 lb (45.813 kg)      Other studies Reviewed: Additional studies/ records that were reviewed today include and review of the records demonstrates:  2-D echo 11/09/15 Echo: EF 50-55% Left ventricle: The cavity size was mildly dilated. There was   mild concentric hypertrophy. Systolic function was mildly   reduced. The estimated ejection fraction was in the range of 50%   to 55%. Diffuse hypokinesis. - Aortic valve: There appears to be sub-valvular calcification in   the LVOT that could be causing sub-valvular stenosis.   Trileaflet; mildly thickened, moderately calcified leaflets,   mostly the left coronary cusp. Transvalvular velocity was   increased. There was moderate stenosis. There was moderate to   severe regurgitation. Peak velocity (S): 350 cm/s. Mean gradient   (S): 26 mm Hg. Valve area (VTI): 1.14 cm^2. Valve area (Vmax):   1.02 cm^2. Valve area (Vmean): 1.09 cm^2. Regurgitation pressure   half-time: 392 ms. - Mitral valve: Calcified annulus. Transvalvular velocity was   within the normal range. There was no evidence for stenosis.   There was trivial regurgitation.  Valve area by continuity   equation (using LVOT flow): 2.24 cm^2. - Left atrium: The atrium was severely dilated. - Right ventricle: The cavity size was normal. Wall thickness was   normal. Systolic function was normal. - Right atrium: The atrium was moderately dilated. Central venous   pressure (est): 15 mm Hg. - Atrial septum: A patent foramen ovale cannot be excluded. - Tricuspid valve: There was mild-moderate regurgitation. - Pulmonic valve: There was mild regurgitation. - Pulmonary arteries: Systolic pressure was moderately increased.   PA peak pressure: 50 mm Hg (S). - Inferior vena cava: The vessel was dilated. The respirophasic   diameter changes were blunted (< 50%), consistent with elevated   central venous pressure.     ASSESSMENT AND PLAN: Atrial flutter (HCC) Patient maintained normal sinus rhythm. Continue Toprol 100 mg and Xarelto 20 mg. Follow-up with Dr. Delton See in 1-2 months  Aortic valve disease Patient has moderate AS and moderate to severe AI mean  gradient 26 mmHg valve area 1.14 cm valve area max 1.02 cm valve area mean 1.09 cm. To be followed closely.  Chronic combined systolic and diastolic heart failure (HCC) Patient had edema up to her abdomen treated with Lasix by primary care. She has lost 6 pounds and is feeling much better. Recommend 2 g sodium diet. Continue HCTZ 25 mg once daily. Lasix 20 mg when necessary for weight gain of 2-3 pounds overnight or edema. We'll check labs today to make sure potassium is stable. Patient is to see a nutritionist.     Signed, Jacolyn Reedy, PA-C  11/21/2015 9:51 AM    [facility] [address] [place], [place], [place]  [address] Phone: [phone]; Fax: [phone]

## 2015-11-21 NOTE — Assessment & Plan Note (Signed)
Patient maintained normal sinus rhythm. Continue Toprol 100 mg and Xarelto 20 mg. Follow-up with Dr. Delton See in 1-2 months

## 2015-11-21 NOTE — Assessment & Plan Note (Signed)
Patient has moderate AS and moderate to severe AI mean gradient 26 mmHg valve area 1.14 cm valve area max 1.02 cm valve area mean 1.09 cm. To be followed closely.

## 2015-11-24 ENCOUNTER — Encounter: Payer: Self-pay | Admitting: Physician Assistant

## 2015-11-24 ENCOUNTER — Other Ambulatory Visit: Payer: Self-pay | Admitting: *Deleted

## 2015-11-24 MED ORDER — FUROSEMIDE 20 MG PO TABS: 20.0000 mg | ORAL_TABLET | Freq: Every day | ORAL | Status: DC | PRN

## 2015-11-30 ENCOUNTER — Telehealth: Payer: Self-pay | Admitting: Internal Medicine

## 2015-11-30 ENCOUNTER — Other Ambulatory Visit: Payer: Self-pay | Admitting: Internal Medicine

## 2015-11-30 MED ORDER — RIVAROXABAN 20 MG PO TABS: 20.0000 mg | ORAL_TABLET | Freq: Every day | ORAL | Status: DC

## 2015-11-30 MED FILL — XARELTO 20 MG TABLET: 20 | 30 days supply | Qty: 30 | Fill #0

## 2015-12-05 NOTE — Telephone Encounter (Signed)
Patient called and is aware.

## 2015-12-12 ENCOUNTER — Other Ambulatory Visit: Payer: Self-pay | Admitting: Internal Medicine

## 2015-12-12 ENCOUNTER — Other Ambulatory Visit: Payer: Self-pay | Admitting: *Deleted

## 2015-12-12 MED ORDER — METOPROLOL SUCCINATE ER 100 MG PO TB24: 100.0000 mg | ORAL_TABLET | Freq: Every day | ORAL | Status: DC

## 2015-12-12 MED FILL — METOPROLOL SUCC ER 100 MG T: 100 | 30 days supply | Qty: 30 | Fill #0

## 2015-12-14 ENCOUNTER — Other Ambulatory Visit: Payer: Self-pay | Admitting: Internal Medicine

## 2015-12-14 MED ORDER — RIVAROXABAN 20 MG PO TABS: 20.0000 mg | ORAL_TABLET | Freq: Every day | ORAL | Status: DC

## 2015-12-22 ENCOUNTER — Encounter: Payer: Self-pay | Admitting: Cardiology

## 2015-12-22 ENCOUNTER — Ambulatory Visit (INDEPENDENT_AMBULATORY_CARE_PROVIDER_SITE_OTHER): Payer: Self-pay | Admitting: Cardiology

## 2015-12-22 ENCOUNTER — Ambulatory Visit
Admission: RE | Admit: 2015-12-22 | Discharge: 2015-12-22 | Disposition: A | Discharge status: Home / Self Care | Payer: No Typology Code available for payment source | Source: Ambulatory Visit | Attending: Cardiology | Admitting: Cardiology

## 2015-12-22 ENCOUNTER — Encounter: Payer: Self-pay | Admitting: Physician Assistant

## 2015-12-22 ENCOUNTER — Telehealth: Payer: Self-pay | Admitting: Cardiology

## 2015-12-22 VITALS — BP 122/68 | HR 87 | Ht 63.0 in | Wt 102.0 lb

## 2015-12-22 DIAGNOSIS — I4892 Unspecified atrial flutter: Secondary | ICD-10-CM

## 2015-12-22 DIAGNOSIS — R0602 Shortness of breath: Secondary | ICD-10-CM

## 2015-12-22 DIAGNOSIS — R079 Chest pain, unspecified: Secondary | ICD-10-CM

## 2015-12-22 DIAGNOSIS — I5042 Chronic combined systolic (congestive) and diastolic (congestive) heart failure: Secondary | ICD-10-CM

## 2015-12-22 DIAGNOSIS — I359 Nonrheumatic aortic valve disorder, unspecified: Secondary | ICD-10-CM

## 2015-12-22 LAB — CBC
HEMATOCRIT: 42.9 % (ref 35.0–45.0)
HEMOGLOBIN: 14.3 g/dL (ref 11.7–15.5)
MCH: 30.8 pg (ref 27.0–33.0)
MCHC: 33.3 g/dL (ref 32.0–36.0)
MCV: 92.5 fL (ref 80.0–100.0)
MPV: 9 fL (ref 7.5–12.5)
Platelets: 199 10*3/uL (ref 140–400)
RBC: 4.64 MIL/uL (ref 3.80–5.10)
RDW: 16 % — ABNORMAL HIGH (ref 11.0–15.0)
WBC: 8.1 10*3/uL (ref 3.8–10.8)

## 2015-12-22 LAB — BASIC METABOLIC PANEL
BUN: 12 mg/dL (ref 7–25)
CHLORIDE: 98 mmol/L (ref 98–110)
CO2: 27 mmol/L (ref 20–31)
Calcium: 8.7 mg/dL (ref 8.6–10.2)
Creat: 0.76 mg/dL (ref 0.50–1.10)
GLUCOSE: 80 mg/dL (ref 65–99)
POTASSIUM: 4.7 mmol/L (ref 3.5–5.3)
SODIUM: 135 mmol/L (ref 135–146)

## 2015-12-22 NOTE — Telephone Encounter (Signed)
Left pt a message to call back. 

## 2015-12-22 NOTE — Telephone Encounter (Signed)
See phone note from 4/13

## 2015-12-22 NOTE — Telephone Encounter (Signed)
Pt called because for the last 2 to 3 days she has been wheezing, SOB specially when she is lying down. Last night she woke of unable to breath. Pt's left leg is swollen. Pt states notice that yesterday she had not voided for sometime so she too the lasix 20 mg which she is to take as needed for weight gain of 2 to 3 lbs  Overnight and swelling. Pt states she does not have a scale. Pt is scheduled to see a nutritionist April 23 rd. Ollen Bowl PA aware of pt's symptoms. PA recommends for pt to come in at 2 PM today. Pt has an appointment with the PA at 2:00 PM. Pt is aware.

## 2015-12-22 NOTE — Telephone Encounter (Signed)
Harriet Butte, RN at 12/22/2015 9:10 AM     Status: Signed       Expand All Collapse All   Left pt a message to call back.

## 2015-12-22 NOTE — Telephone Encounter (Signed)
Pt c/o Shortness Of Breath: STAT if SOB developed within the last 24 hours or pt is noticeably SOB on the phone  1. Are you currently SOB (can you hear that pt is SOB on the phone)? yes  2. How long have you been experiencing SOB? Pass couple days  3. Are you SOB when sitting or when up moving around? yes  4. Are you currently experiencing any other symptoms? Yes, wheezing, cough productive.

## 2015-12-22 NOTE — Progress Notes (Signed)
Cardiology Office Note   Date:  12/23/2015   ID:  KAOIR LOREE, DOB 12-24-1970, MRN [id number]  PCP:  No primary care provider on file.  Cardiologist:  Dr. Delton See   Chief Complaint  Patient presents with  . Shortness of Breath    increased DOE      History of Present Illness: Sheila Branch is a 45 y.o. female who presents for DOE, and irregular heart beat.   She has a history of congenital heart disease and had not seen a PCP for 18 years. She was admitted with pneumonia and was noted to be in atrial flutter with RVR. She was successfully cardioverted and continued on Xarelto. 2-D echo showed mild decrease LV function EF 50-55% with diffuse hypokinesis. There was mild concentric hypertrophy. There appears to be subvalvular calcification in the LVOT that could be causing some volume inhaler stenosis. There was moderate aortic stenosis and moderate to severe AI. The atrium was severely dilated. PA pressures were increased to 50 mmHg.  She was seen by internal medicine on 11/16/15 complaining of increased dyspnea on exertion, leg swelling and tightness up to her abdomen. She was given Lasix 20 mg daily for 5 days.She lost 6 lbs. she says she quit smoking and knows she does not eat properly. She is scheduled to see a nutritionist to help her with this. She denies any further palpitations. She is trying to take better care of herself. Her swelling and shortness of breath has improved.  She continues with soft drinks with caffeine, [employer] and [employer]- EKG today with PACs . She has edema of her feet today.  She does not have scales to weigh on and did not know when to take lasix.  She is very dyspneic with exertion not at rest. trhis is associated with burning in her chest.  She is watching her diet more so.           Past Medical History  Diagnosis Date  . Mitral and aortic heart valve diseases, unspecified     Past Surgical History  Procedure Laterality Date  . Cardiac surgery     . Cesarean section    . Rectal surgery    . Tubal ligation    . Cardioversion N/A 11/09/2015    Procedure: CARDIOVERSION;  Surgeon: Lars Masson, MD;  Location: [facility] ENDOSCOPY;  Service: Cardiovascular;  Laterality: N/A;  . Tee without cardioversion N/A 11/09/2015    Procedure: TRANSESOPHAGEAL ECHOCARDIOGRAM (TEE);  Surgeon: Lars Masson, MD;  Location: [facility] ENDOSCOPY;  Service: Cardiovascular;  Laterality: N/A;     Current Outpatient Prescriptions  Medication Sig Dispense Refill  . acetaminophen (TYLENOL) 325 MG tablet Take 325 mg by mouth every 6 (six) hours as needed. Reported on 11/21/2015    . furosemide (LASIX) 20 MG tablet Take 20 mg by mouth daily as needed. FOR SWELLING ONLY    . metoprolol succinate (TOPROL XL) 100 MG 24 hr tablet Take 1 tablet (100 mg total) by mouth daily. Take with or immediately following a meal. 30 tablet 10  . rivaroxaban (XARELTO) 20 MG TABS tablet Take 1 tablet (20 mg total) by mouth daily with supper. 90 tablet 3  . triamcinolone (KENALOG) 0.1 % paste Use as directed 1 application in the mouth or throat 2 (two) times daily. 5 g 12   No current facility-administered medications for this visit.    Allergies:   Penicillins cross reactors    Social History:  The patient  reports  that she quit smoking about 3 months ago. Her smoking use included Cigarettes. She has a 23 pack-year smoking history. She does not have any smokeless tobacco history on file. She reports that she uses illicit drugs (Marijuana). She reports that she does not drink alcohol.   Family History:  The patient's family history includes Heart disease in her mother.    ROS:  General:no colds or fevers, no weight changes Skin:no rashes or ulcers HEENT:no blurred vision, no congestion CV:see HPI PUL:see HPI GI:no diarrhea constipation or melena, no indigestion GU:no hematuria, no dysuria MS:no joint pain, no claudication Neuro:no syncope, no lightheadedness Endo:no diabetes, no  thyroid disease  Wt Readings from Last 3 Encounters:  12/22/15 102 lb (46.267 kg)  11/21/15 98 lb (44.453 kg)  11/16/15 104 lb 3.2 oz (47.265 kg)     PHYSICAL EXAM: VS:  BP 122/68 mmHg  Pulse 87  Ht 5\' 3"  (1.6 m)  Wt 102 lb (46.267 kg)  BMI 18.07 kg/m2  SpO2 98%  LMP  (Within Days) , BMI Body mass index is 18.07 kg/(m^2). General:Pleasant affect, NAD Skin:Warm and dry, brisk capillary refill HEENT:normocephalic, sclera clear, mucus membranes moist Neck:supple, no JVD, no bruits  Heart:S1S2 RRR with 4/6 systolic and diastolic murmur, no gallup, rub or click Lungs: without rales,  +rhonchi, no wheezes MGN:OIBB, non tender, + BS, do not palpate liver spleen or masses Ext:no lower ext edema except at feet, 2+ pedal pulses, 2+ radial pulses Neuro:alert and oriented X 3, MAE, follows commands, + facial symmetry    EKG:  EKG is ordered today. The ekg ordered today demonstrates SR at 87 with PACs freq and LVH.  No acute changes.    Recent Labs: 11/03/2015: ALT 27; TSH 0.984 11/07/2015: Magnesium 2.1 12/22/2015: BUN 12; Creat 0.76; Hemoglobin 14.3; Platelets 199; Potassium 4.7; Sodium 135    Lipid Panel No results found for: CHOL, TRIG, HDL, CHOLHDL, VLDL, LDLCALC, LDLDIRECT     Other studies Reviewed: Additional studies/ records that were reviewed today include: reviewed TEE and Echo..   ASSESSMENT AND PLAN:  1.  Increased DOE and chest Burning - may be due to valvular disease and HF alone, but concern for ischemic disease as well.  I ordered exercise myoview- one to evaluate HR with exertion and two to evaluate for ischemia, but pt is self pay and with her valve disease ? Need for Rt and Lt heart cath.  Will have Dr. Mayford Knife review and will send to Dr. Delton See.  I am checking labs and CXR.  2. A flutter resolved maintaining SR but with freq PACs.  Concerned that she will go back into  Aflutter, discussed importance of changing soft drinks to caffeine free  3.  Heart failure  with valvular disease.     4.  Aortic valve disease.  Tricuspid mild to mod regurg.    Current medicines are reviewed with the patient today.  The patient Has no concerns regarding medicines.  The following changes have been made:  See above Labs/ tests ordered today include:see above  Disposition:   FU:  see above  Sheila Lint, NP  12/23/2015 2:58 PM    [facility] [address] [place], [place], [place]  [address]/ [address] [place], [place] Phone: [phone]; Fax: [phone]  [phone]

## 2015-12-22 NOTE — Patient Instructions (Addendum)
Medication Instructions:   TAKE LASIX ONLY AS NEEDED FOR SWELLING   If you need a refill on your cardiac medications before your next appointment, please call your pharmacy.  Labwork:  BMET  , CBC AND BNP    Testing/Procedures:  Your physician has requested that you have en exercise stress myoview. For further information please visit [email or web address] Please follow instruction sheet, as given.  A chest x-ray takes a picture of the organs and structures inside the chest, including the heart, lungs, and blood vessels. This test can show several things, including, whether the heart is enlarges; whether fluid is building up in the lungs; and whether pacemaker / defibrillator leads are still in place. [address]  AT [place] [facility] AT THE [facility] FIRST FLOOR     Follow-Up:  WITH DR NELSON  1 TO 2 WEEKS AFTER STRESS TEST    Any Other Special Instructions Will Be Listed Below (If Applicable).

## 2015-12-23 ENCOUNTER — Encounter: Payer: Self-pay | Admitting: Cardiology

## 2015-12-23 ENCOUNTER — Telehealth (HOSPITAL_COMMUNITY): Payer: Self-pay | Admitting: *Deleted

## 2015-12-23 LAB — BRAIN NATRIURETIC PEPTIDE: Brain Natriuretic Peptide: 1616.8 pg/mL — ABNORMAL HIGH (ref <100)

## 2015-12-23 NOTE — Telephone Encounter (Signed)
Patient given detailed instructions per Myocardial Perfusion Study Information Sheet for the test on 12/27/15 at 7:45. Patient notified to arrive 15 minutes early and that it is imperative to arrive on time for appointment to keep from having the test rescheduled.  If you need to cancel or reschedule your appointment, please call the office within 24 hours of your appointment. Failure to do so may result in a cancellation of your appointment, and a $50 no show fee. Patient verbalized understanding. Sheila Branch

## 2015-12-26 ENCOUNTER — Telehealth: Payer: Self-pay | Admitting: *Deleted

## 2015-12-26 ENCOUNTER — Encounter: Payer: Self-pay | Admitting: Cardiology

## 2015-12-26 ENCOUNTER — Encounter: Payer: Self-pay | Admitting: *Deleted

## 2015-12-26 DIAGNOSIS — R072 Precordial pain: Secondary | ICD-10-CM

## 2015-12-26 DIAGNOSIS — Q249 Congenital malformation of heart, unspecified: Secondary | ICD-10-CM

## 2015-12-26 DIAGNOSIS — I5042 Chronic combined systolic (congestive) and diastolic (congestive) heart failure: Secondary | ICD-10-CM

## 2015-12-26 DIAGNOSIS — R079 Chest pain, unspecified: Secondary | ICD-10-CM | POA: Insufficient documentation

## 2015-12-26 DIAGNOSIS — I359 Nonrheumatic aortic valve disorder, unspecified: Secondary | ICD-10-CM

## 2015-12-26 DIAGNOSIS — Z01812 Encounter for preprocedural laboratory examination: Secondary | ICD-10-CM

## 2015-12-26 NOTE — Telephone Encounter (Signed)
S/w pt is aware myoview was cancelled tomorrow due to cost and a more expensive test in the near future per Nada Boozer, NP.  Pt sent Aggie Hacker, LPN a message to see if pt could be seen sooner by Dr. Delton See.  Pt dose have two appointments in system to see Dr. Delton See which will be kept unless Dr. Delton See can see pt sooner.  Pt SOB has gotten better but, has had GI bug all weekend.  Pt also has stopped caffeine. Pt is agreeable to plan.

## 2015-12-26 NOTE — Telephone Encounter (Signed)
I saw this pt on Thursday with heart failure and chest pain. She has congential HF and you saw in the hospital. I increased lasix and was going to do stress myoview but she is self pay and looks like she is headed for Rt and Lt heart cath- can you see her soon to decide Plan. I am cancelling stress test. Thanks Nada Boozer NP   Lajoyce Corners, please cancel the stress test and arrange for a right and left sided cath.     THank you,    Aris Lot            ----- Message -----     From: Quintella Reichert, MD     Sent: 12/23/2015  3:48 PM      To: Leone Brand, NP, Lars Masson, MD        I would prefer for Tobias Alexander to make the call since she has congenital heart disease and she knows her - she will be back on Monday     ----- Message -----     From: Leone Brand, NP     Sent: 12/23/2015  3:36 PM      To: Quintella Reichert, MD, Lars Masson, MD        Dr. Mayford Knife, could you review this- a pt of Dr. Delton See but she is not available, this pt. With aortic valve disease and now with HF and DOE with chest burning. She will take her lasix but I ordered an exercise myoview (pt is self pay) and then I wondered is she would be better served with Rt and Lt heart cath. Her Nuc is scheduled for Tuesday. I saw her due to feet swelling but I did CXR + edema. ? Sarcoidosis .   Thanks.

## 2015-12-26 NOTE — Telephone Encounter (Signed)
Leaving a note up front for pts missed on work on the day of appointment with Nada Boozer, NP.

## 2015-12-26 NOTE — Telephone Encounter (Signed)
Spoke with the pt and informed her that per Nada Boozer NP and Dr Delton See, they both agree to cancel her stress test scheduled for tomorrow 4/18, and instead, schedule her for a right and left sided cardiac cath.  Provided pt education about what a right and left cardiac cath is and possible interventions with this procedure.  Informed the pt that she must come in for pre-cath labs prior to her cath, per protocol.  Pt states that she cannot be scheduled for the cath until her next day off, on Friday 01/06/16.  Reiterated to the pt the importance of having this done asap, but the pt states she absolutely cannot do this until 4/28.  Pt states she can come in for pre-cath labs on Wednesday 01/04/16 to check a PT/INR, CMET, and CBC W Diff.  Scheduled her lab appt for Wednesday 01/04/16 at our office for pre-cath labs.  Scheduled the pts right/left cath for Friday 01/06/16 at 0900 [facility], as this was the requested day by the pt.  Informed the pt that she must arrive at [facility] at 0700.  Informed the pt that Dr Swaziland will be doing her cath.  Informed the pt that when she comes in for her pre-cath labs on 01/04/16, there will be a cath instruction letter for her to pick up at the front desk.  Went over cath instructions with the pt over the phone, and informed her that she must hold her lasix the day of her cath and hold her Xarelto 2 days prior to her cath.  Informed the pt that her last dose of Xarelto will need to be on Tuesday 01/03/16. Pt verbalized understanding and agrees with this plan.  Will send Dr Delton See a message to place hospital orders in for this pts cath for Friday 01/06/16 at 0900 with Dr Swaziland.  Will send pre-cert a message in regards to her 4/28 cath.  Will cancel the pts stress test, scheduled for tomorrow 4/18, per Dr Delton See and Nada Boozer NP.

## 2015-12-27 ENCOUNTER — Encounter (HOSPITAL_COMMUNITY): Payer: Self-pay

## 2015-12-27 NOTE — Addendum Note (Signed)
Addended by: Lars Masson on: 12/27/2015 01:44 PM   Modules accepted: Orders

## 2015-12-29 ENCOUNTER — Encounter: Payer: Self-pay | Admitting: *Deleted

## 2015-12-29 ENCOUNTER — Encounter: Payer: Self-pay | Admitting: Cardiology

## 2015-12-29 NOTE — Telephone Encounter (Signed)
Dr Delton See, can you review this pts request through mychart to help her with decreasing her work hours, for she will have a cath on 4/28, and works long shifts, on her feet all day? Pt sent in a mychart request, asking this.

## 2016-01-02 ENCOUNTER — Other Ambulatory Visit: Payer: Self-pay | Admitting: Internal Medicine

## 2016-01-02 ENCOUNTER — Other Ambulatory Visit: Payer: Self-pay | Admitting: *Deleted

## 2016-01-02 ENCOUNTER — Encounter: Payer: Self-pay | Admitting: Cardiology

## 2016-01-02 MED ORDER — RIVAROXABAN 20 MG PO TABS: 20.0000 mg | ORAL_TABLET | Freq: Every day | ORAL | Status: DC

## 2016-01-04 ENCOUNTER — Other Ambulatory Visit (INDEPENDENT_AMBULATORY_CARE_PROVIDER_SITE_OTHER): Payer: Self-pay

## 2016-01-04 DIAGNOSIS — I5042 Chronic combined systolic (congestive) and diastolic (congestive) heart failure: Secondary | ICD-10-CM

## 2016-01-04 DIAGNOSIS — Q249 Congenital malformation of heart, unspecified: Secondary | ICD-10-CM

## 2016-01-04 DIAGNOSIS — Z01812 Encounter for preprocedural laboratory examination: Secondary | ICD-10-CM

## 2016-01-04 DIAGNOSIS — R072 Precordial pain: Secondary | ICD-10-CM

## 2016-01-04 DIAGNOSIS — I359 Nonrheumatic aortic valve disorder, unspecified: Secondary | ICD-10-CM

## 2016-01-04 LAB — CBC WITH DIFFERENTIAL/PLATELET
Basophils Absolute: 0 cells/uL (ref 0–200)
Basophils Relative: 0 %
Eosinophils Absolute: 480 cells/uL (ref 15–500)
Eosinophils Relative: 6 %
HCT: 38.6 % (ref 35.0–45.0)
Hemoglobin: 12.6 g/dL (ref 11.7–15.5)
Lymphocytes Relative: 21 %
Lymphs Abs: 1680 cells/uL (ref 850–3900)
MCH: 30 pg (ref 27.0–33.0)
MCHC: 32.6 g/dL (ref 32.0–36.0)
MCV: 91.9 fL (ref 80.0–100.0)
MPV: 9 fL (ref 7.5–12.5)
Monocytes Absolute: 640 cells/uL (ref 200–950)
Monocytes Relative: 8 %
Neutro Abs: 5200 cells/uL (ref 1500–7800)
Neutrophils Relative %: 65 %
Platelets: 318 10*3/uL (ref 140–400)
RBC: 4.2 MIL/uL (ref 3.80–5.10)
RDW: 16 % — ABNORMAL HIGH (ref 11.0–15.0)
WBC: 8 10*3/uL (ref 3.8–10.8)

## 2016-01-04 LAB — COMPREHENSIVE METABOLIC PANEL
ALT: 19 U/L (ref 6–29)
AST: 23 U/L (ref 10–30)
Albumin: 2.9 g/dL — ABNORMAL LOW (ref 3.6–5.1)
Alkaline Phosphatase: 85 U/L (ref 33–115)
BUN: 16 mg/dL (ref 7–25)
CO2: 31 mmol/L (ref 20–31)
Calcium: 8.4 mg/dL — ABNORMAL LOW (ref 8.6–10.2)
Chloride: 102 mmol/L (ref 98–110)
Creat: 0.71 mg/dL (ref 0.50–1.10)
Glucose, Bld: 89 mg/dL (ref 65–99)
Potassium: 3.7 mmol/L (ref 3.5–5.3)
Sodium: 142 mmol/L (ref 135–146)
Total Bilirubin: 1 mg/dL (ref 0.2–1.2)
Total Protein: 5.9 g/dL — ABNORMAL LOW (ref 6.1–8.1)

## 2016-01-04 LAB — PROTIME-INR
INR: 1.41 (ref <1.50)
Prothrombin Time: 17.4 seconds — ABNORMAL HIGH (ref 11.6–15.2)

## 2016-01-06 ENCOUNTER — Telehealth: Payer: Self-pay | Admitting: *Deleted

## 2016-01-06 ENCOUNTER — Ambulatory Visit (HOSPITAL_COMMUNITY)
Admission: EM | Admit: 2016-01-06 | Discharge: 2016-01-06 | Disposition: A | Discharge status: Home / Self Care | Payer: MEDICAID | Source: Other Acute Inpatient Hospital | Attending: Physician Assistant | Admitting: Physician Assistant

## 2016-01-06 ENCOUNTER — Ambulatory Visit (HOSPITAL_COMMUNITY)
Admission: RE | Admit: 2016-01-06 | Discharge: 2016-01-06 | Disposition: A | Discharge status: Home / Self Care | Payer: Self-pay | Source: Ambulatory Visit | Attending: Cardiology | Admitting: Cardiology

## 2016-01-06 ENCOUNTER — Encounter (HOSPITAL_COMMUNITY): Payer: Self-pay

## 2016-01-06 ENCOUNTER — Ambulatory Visit (HOSPITAL_COMMUNITY)
Admission: RE | Admit: 2016-01-06 | Discharge: 2016-01-06 | Disposition: A | Discharge status: Home / Self Care | Payer: Self-pay | Source: Ambulatory Visit | Attending: Physician Assistant | Admitting: Physician Assistant

## 2016-01-06 ENCOUNTER — Encounter: Payer: Self-pay | Admitting: Physician Assistant

## 2016-01-06 ENCOUNTER — Other Ambulatory Visit: Payer: Self-pay | Admitting: Physician Assistant

## 2016-01-06 ENCOUNTER — Encounter (HOSPITAL_COMMUNITY)
Admission: RE | Disposition: A | Discharge status: Home / Self Care | Payer: Self-pay | Source: Ambulatory Visit | Attending: Cardiology

## 2016-01-06 ENCOUNTER — Ambulatory Visit: Payer: Self-pay | Attending: Physician Assistant | Admitting: Physician Assistant

## 2016-01-06 ENCOUNTER — Encounter: Payer: Self-pay | Admitting: Clinical

## 2016-01-06 VITALS — BP 122/74 | HR 123 | Temp 97.9°F | Resp 20 | Ht 63.0 in | Wt 104.0 lb

## 2016-01-06 DIAGNOSIS — R197 Diarrhea, unspecified: Secondary | ICD-10-CM | POA: Insufficient documentation

## 2016-01-06 DIAGNOSIS — R600 Localized edema: Secondary | ICD-10-CM | POA: Insufficient documentation

## 2016-01-06 DIAGNOSIS — R609 Edema, unspecified: Secondary | ICD-10-CM

## 2016-01-06 DIAGNOSIS — M79605 Pain in left leg: Secondary | ICD-10-CM

## 2016-01-06 DIAGNOSIS — R079 Chest pain, unspecified: Secondary | ICD-10-CM

## 2016-01-06 DIAGNOSIS — Z5309 Procedure and treatment not carried out because of other contraindication: Secondary | ICD-10-CM | POA: Insufficient documentation

## 2016-01-06 DIAGNOSIS — I4892 Unspecified atrial flutter: Secondary | ICD-10-CM | POA: Insufficient documentation

## 2016-01-06 DIAGNOSIS — Z79899 Other long term (current) drug therapy: Secondary | ICD-10-CM | POA: Insufficient documentation

## 2016-01-06 DIAGNOSIS — M7989 Other specified soft tissue disorders: Secondary | ICD-10-CM

## 2016-01-06 DIAGNOSIS — R05 Cough: Secondary | ICD-10-CM | POA: Insufficient documentation

## 2016-01-06 DIAGNOSIS — R0600 Dyspnea, unspecified: Secondary | ICD-10-CM | POA: Insufficient documentation

## 2016-01-06 DIAGNOSIS — M79662 Pain in left lower leg: Secondary | ICD-10-CM

## 2016-01-06 SURGERY — INVASIVE LAB ABORTED CASE

## 2016-01-06 MED ORDER — ASPIRIN 81 MG PO CHEW: 81.0000 mg | CHEWABLE_TABLET | ORAL | Status: DC

## 2016-01-06 MED ORDER — SODIUM CHLORIDE 0.9% FLUSH: 3.0000 mL | INTRAVENOUS | Status: DC | PRN

## 2016-01-06 MED ORDER — SODIUM CHLORIDE 0.9 % WEIGHT BASED INFUSION: 3.0000 mL/kg/h | INTRAVENOUS | Status: DC

## 2016-01-06 MED ORDER — SODIUM CHLORIDE 0.9% FLUSH: 3.0000 mL | Freq: Two times a day (BID) | INTRAVENOUS | Status: DC

## 2016-01-06 MED ORDER — SODIUM CHLORIDE 0.9 % IV SOLN: 250.0000 mL | INTRAVENOUS | Status: DC | PRN

## 2016-01-06 MED ORDER — SODIUM CHLORIDE 0.9 % WEIGHT BASED INFUSION: 1.0000 mL/kg/h | INTRAVENOUS | Status: DC

## 2016-01-06 MED ORDER — METOPROLOL TARTRATE 12.5 MG HALF TABLET: 25.0000 mg | ORAL_TABLET | Freq: Once | ORAL | Status: DC

## 2016-01-06 MED FILL — **XARELTO 20 MG TABLET: 20 MG | 5 days supply | Qty: 5 | Fill #0

## 2016-01-06 SURGICAL SUPPLY — 6 items
KIT HEART LEFT (KITS) ×4 IMPLANT
KIT HEART RIGHT NAMIC (KITS) ×4 IMPLANT
PACK CARDIAC CATHETERIZATION (CUSTOM PROCEDURE TRAY) ×4 IMPLANT
SYR MEDRAD MARK V 150ML (SYRINGE) ×4 IMPLANT
TRANSDUCER W/STOPCOCK (MISCELLANEOUS) ×8 IMPLANT
TUBING CIL FLEX 10 FLL-RA (TUBING) ×4 IMPLANT

## 2016-01-06 NOTE — Progress Notes (Signed)
Depression screen [facility] 2/9 01/06/2016 11/16/2015  Decreased Interest 0 0  Down, Depressed, Hopeless 0 0  PHQ - 2 Score 0 0  Altered sleeping 1 -  Tired, decreased energy 2 -  Change in appetite 2 -  Feeling bad or failure about yourself  0 -  Trouble concentrating 0 -  Moving slowly or fidgety/restless 0 -  Suicidal thoughts 0 -  PHQ-9 Score 5 -    GAD 7 : Generalized Anxiety Score 01/06/2016  Nervous, Anxious, on Edge 1  Control/stop worrying 1  Worry too much - different things 0  Trouble relaxing 2  Restless 0  Easily annoyed or irritable 0  Afraid - awful might happen 0  Total GAD 7 Score 4

## 2016-01-06 NOTE — Progress Notes (Signed)
today's procedure cancelled due to cough, diarrhea, dry heaves. Dr Swaziland was updated and today's l/rhc was to be rescheduled.

## 2016-01-06 NOTE — Telephone Encounter (Signed)
Per Victorino Dike RN in cath lab and Nada Boozer NP, this pts cath was cancelled today, for GI issues.  Pt presented to have her scheduled cath today and pt complained of symptoms of N/V, dry-heaving, and diarrhea.  Per Victorino Dike and Vernona Rieger, this pt needs to be rescheduled in the clinic, to see flex next week, to check how the pts GI issues are and to reschedule her cardiac cath at that visit, if safe.  Called the pt back to inform her that we have scheduled her on the flex schedule for next Thursday 01/12/16 at 1030 with [facility] PA-C.  Informed the pt that she should report to our office here at [facility] location, where she see's her Primary Cardiologist.  Pt verbalized understanding and agrees with this plan.  Pt reports that her GI issues are somewhat improving, for she is no longer vomiting, only diarrhea.  Pt did want to let us know that she scheduled an appt with her PCP for today at 3 pm, for her GI complaints, and to inform him that she needs to have this issue addressed to have her cath rescheduled.  Informed the pt that is great news, and will help with the process of getting her cath rescheduled.  Informed the pt that I will route this information to Carlean Jews PA-C and Dr Delton See as an fyi.  Pt gracious for all the assistance provided.   FYI- to [employer] did pre-cath labs on 01/04/16 and per Dr Delton See, her blood work showed only low albumin and protein otherwise normal labs.

## 2016-01-06 NOTE — Telephone Encounter (Signed)
-----   Message from Leone Brand, NP sent at 01/06/2016  1:08 PM EDT ----- I am out of town - so with flex.  Thanks.  ----- Message -----    From: Loa Socks, LPN    Sent: 0/17/5102   9:46 AM      To: Leone Brand, NP  I will absolutely schedule for follow-up.  Vernona Rieger do you want to see her or put her on the flex schedule?  Thanks,   Lajoyce Corners  ----- Message -----    From: Leone Brand, NP    Sent: 01/06/2016   9:33 AM      To: Loa Socks, LPN  Can you please arrange IVEY.  thanks ----- Message -----    From: Jolyn Nap, RN    Sent: 01/06/2016   9:19 AM      To: Leone Brand, NP, Loa Socks, LPN  Burnetta Sabin,  We canceled Ms Tiburcio Pea cath today, she has had stomach for couple weeks, Vernona Rieger said please schedule her a follow up appt next week to make sure she is better before putting back on cath schedule  Thanks  [facility]

## 2016-01-06 NOTE — Progress Notes (Signed)
Chief Complaint: Cough, SHOB, diarrhea  Subjective: This is a pleasant 45 year old female with a cardiac history. She has a history of atrial fibrillation/flutter and is status post direct current cardioversion earlier this year. She is maintained on beta blockers and Xarelto. She also has a history of aortic valve disease and a congenital heart defect. The details are unclear. She recently saw her cardiologist for dyspnea. Cardiac catheterization was planned for today. When she arrived for her procedure she told them this evening status and increasing coughing and increasing diarrhea. In regards to her cough she states she's been more short of breath lately she's also been having some episodes of coughing when she lay down. Her legs are welling. +Orthopnea. She took Lasix on yesterday but prior to that had not taken any in quite some time.   In regards to the diarrhea, she is having watery stools for the last 3 days. She states she believes she's been at least 10 times a day. She doesn't remember eating anything unusual. She has not traveled. There is no odor. She's tried some over-the-counter Kaopectate with no relief. She does not have fever or chills.    ROS:  GEN: denies fever or chills, denies change in weight Skin: denies lesions or rashes HEENT: denies headache, earache, epistaxis, sore throat, or neck pain LUNGS: + SHOB, dyspnea, PND, orthopnea CV: denies CP or palpitations;  ABD: denies abd pain, N or V, +D EXT: denies muscle spasms or swelling; no pain in lower ext, no weakness NEURO: denies numbness or tingling, denies sz, stroke or TIA   Objective:  Filed Vitals:   01/06/16 1538  BP: 122/74  Pulse: 123  Temp: 97.9 F (36.6 C)  TempSrc: Oral  Resp: 20  Height: 5\' 3"  (1.6 m)  Weight: 104 lb (47.174 kg)  SpO2: 96%    Physical Exam:  General: in no acute distress. HEENT: no pallor, no icterus, moist oral mucosa, no JVD, no lymphadenopathy Heart: Normal  s1 &s2   Regular but fast, with 2-3/6 murmur, rubs, gallops. Lungs: Clear to auscultation bilaterally. Abdomen: Soft, nontender, slightly distended, positive bowel sounds. Extremities: No clubbing cyanosis or edema with positive pedal pulses. Neuro: Alert, awake, oriented x3, nonfocal.    Medications: Prior to Admission medications   Medication Sig Start Date End Date Taking? Authorizing Provider  acetaminophen (TYLENOL) 325 MG tablet Take 325 mg by mouth every 6 (six) hours as needed for mild pain. Reported on 11/21/2015   Yes Historical Provider, MD  furosemide (LASIX) 20 MG tablet Take 20 mg by mouth daily as needed. FOR SWELLING ONLY   Yes Historical Provider, MD  metoprolol succinate (TOPROL XL) 100 MG 24 hr tablet Take 1 tablet (100 mg total) by mouth daily. Take with or immediately following a meal. 12/12/15  Yes Dyann Kief, PA-C  rivaroxaban (XARELTO) 20 MG TABS tablet Take 1 tablet (20 mg total) by mouth daily with supper. Patient taking differently: Take 20 mg by mouth every evening.  01/02/16  Yes Leone Brand, NP  triamcinolone (KENALOG) 0.1 % paste Use as directed 1 application in the mouth or throat 2 (two) times daily. Patient not taking: Reported on 01/06/2016 11/16/15   Pete Glatter, MD  EKG-aflutter with RVR, 108 bpm  Assessment: 1. Paroxysmal Atrial Flutter with RVR, rate 108 bpm 2. Dyspnea/Cough 3. Left LE Swelling 4. Acute on Chronic dHF exacerbation 5. Diarrhea ? related to Xarelto vs Viral  Plan: I encouraged hospitalization but pt and daughter decline Additional  25 mg Metoprolol given today Restart Xarelto today, may need to switch to NOAC more gentle on stomach Cont home dose BB Take Lasix daily until we see again on Monday Watch for signs of dehydration or fevers etc [place] left lower extremity CARDS appt next week  Follow up:3 days/Monday  The patient was given clear instructions to go to ER or return to medical center if symptoms don't improve, worsen or new  problems develop. The patient verbalized understanding. The patient was told to call to get lab results if they haven't heard anything in the next week.   This note has been created with Education officer, environmental. Any transcriptional errors are unintentional.   Scot Jun, PA-C 01/06/2016, 4:11 PM

## 2016-01-06 NOTE — Progress Notes (Signed)
Patient's c/o that she's been experiencing diarrhea and cough with SOB.  Patient states that she's had chest pain this morning. Pain in her L arm and shoulder.  Patient requesting medication for her anxiety.  Per Tiffany, patient will be given 25mg  of metoprolol.

## 2016-01-06 NOTE — Patient Instructions (Addendum)
Check your weight daily for the next 3 days and keep a log Watch salt intake Get the ultrasound done Come back on Monday for appt Take your Lasix every day for the next 3 days You have refills on your Xarelto, pls restart tonight

## 2016-01-08 ENCOUNTER — Other Ambulatory Visit: Payer: Self-pay

## 2016-01-08 ENCOUNTER — Other Ambulatory Visit (HOSPITAL_COMMUNITY): Payer: Self-pay

## 2016-01-08 ENCOUNTER — Encounter (HOSPITAL_COMMUNITY): Payer: Self-pay | Admitting: Emergency Medicine

## 2016-01-08 ENCOUNTER — Emergency Department (HOSPITAL_COMMUNITY): Payer: Self-pay

## 2016-01-08 ENCOUNTER — Inpatient Hospital Stay (HOSPITAL_COMMUNITY)
Admission: EM | Admit: 2016-01-08 | Discharge: 2016-01-15 | DRG: 286 | Disposition: A | Discharge status: Home / Self Care | Payer: MEDICAID | Attending: Oncology | Admitting: Oncology

## 2016-01-08 DIAGNOSIS — I272 Other secondary pulmonary hypertension: Secondary | ICD-10-CM | POA: Diagnosis present

## 2016-01-08 DIAGNOSIS — I359 Nonrheumatic aortic valve disorder, unspecified: Secondary | ICD-10-CM | POA: Diagnosis present

## 2016-01-08 DIAGNOSIS — I5042 Chronic combined systolic (congestive) and diastolic (congestive) heart failure: Secondary | ICD-10-CM | POA: Diagnosis present

## 2016-01-08 DIAGNOSIS — N179 Acute kidney failure, unspecified: Secondary | ICD-10-CM

## 2016-01-08 DIAGNOSIS — R059 Cough, unspecified: Secondary | ICD-10-CM | POA: Diagnosis present

## 2016-01-08 DIAGNOSIS — I454 Nonspecific intraventricular block: Secondary | ICD-10-CM | POA: Diagnosis not present

## 2016-01-08 DIAGNOSIS — Z7901 Long term (current) use of anticoagulants: Secondary | ICD-10-CM

## 2016-01-08 DIAGNOSIS — I2510 Atherosclerotic heart disease of native coronary artery without angina pectoris: Secondary | ICD-10-CM

## 2016-01-08 DIAGNOSIS — A047 Enterocolitis due to Clostridium difficile: Secondary | ICD-10-CM | POA: Diagnosis present

## 2016-01-08 DIAGNOSIS — I4891 Unspecified atrial fibrillation: Secondary | ICD-10-CM | POA: Diagnosis present

## 2016-01-08 DIAGNOSIS — K089 Disorder of teeth and supporting structures, unspecified: Secondary | ICD-10-CM

## 2016-01-08 DIAGNOSIS — I5043 Acute on chronic combined systolic (congestive) and diastolic (congestive) heart failure: Secondary | ICD-10-CM | POA: Diagnosis present

## 2016-01-08 DIAGNOSIS — Q239 Congenital malformation of aortic and mitral valves, unspecified: Secondary | ICD-10-CM

## 2016-01-08 DIAGNOSIS — Z880 Allergy status to penicillin: Secondary | ICD-10-CM

## 2016-01-08 DIAGNOSIS — I351 Nonrheumatic aortic (valve) insufficiency: Secondary | ICD-10-CM | POA: Diagnosis present

## 2016-01-08 DIAGNOSIS — Z8249 Family history of ischemic heart disease and other diseases of the circulatory system: Secondary | ICD-10-CM

## 2016-01-08 DIAGNOSIS — I4819 Other persistent atrial fibrillation: Secondary | ICD-10-CM | POA: Diagnosis present

## 2016-01-08 DIAGNOSIS — Z8701 Personal history of pneumonia (recurrent): Secondary | ICD-10-CM

## 2016-01-08 DIAGNOSIS — I480 Paroxysmal atrial fibrillation: Secondary | ICD-10-CM | POA: Diagnosis present

## 2016-01-08 DIAGNOSIS — Q244 Congenital subaortic stenosis: Secondary | ICD-10-CM

## 2016-01-08 DIAGNOSIS — I5021 Acute systolic (congestive) heart failure: Secondary | ICD-10-CM

## 2016-01-08 DIAGNOSIS — Z87891 Personal history of nicotine dependence: Secondary | ICD-10-CM

## 2016-01-08 DIAGNOSIS — I420 Dilated cardiomyopathy: Secondary | ICD-10-CM | POA: Diagnosis present

## 2016-01-08 DIAGNOSIS — I509 Heart failure, unspecified: Secondary | ICD-10-CM

## 2016-01-08 DIAGNOSIS — R079 Chest pain, unspecified: Secondary | ICD-10-CM | POA: Diagnosis present

## 2016-01-08 DIAGNOSIS — J449 Chronic obstructive pulmonary disease, unspecified: Secondary | ICD-10-CM | POA: Diagnosis present

## 2016-01-08 DIAGNOSIS — R197 Diarrhea, unspecified: Secondary | ICD-10-CM | POA: Diagnosis present

## 2016-01-08 DIAGNOSIS — I481 Persistent atrial fibrillation: Principal | ICD-10-CM | POA: Diagnosis present

## 2016-01-08 DIAGNOSIS — R05 Cough: Secondary | ICD-10-CM | POA: Diagnosis present

## 2016-01-08 HISTORY — DX: Paroxysmal atrial fibrillation: I48.0

## 2016-01-08 HISTORY — DX: Congenital malformation of heart, unspecified: Q24.9

## 2016-01-08 HISTORY — DX: Nonrheumatic aortic (valve) insufficiency: I35.1

## 2016-01-08 HISTORY — DX: Congenital subaortic stenosis: Q24.4

## 2016-01-08 HISTORY — DX: Chronic combined systolic (congestive) and diastolic (congestive) heart failure: I50.42

## 2016-01-08 LAB — CBC WITH DIFFERENTIAL/PLATELET
BASOS ABS: 0 10*3/uL (ref 0.0–0.1)
Basophils Relative: 0 %
EOS PCT: 2 %
Eosinophils Absolute: 0.2 10*3/uL (ref 0.0–0.7)
HEMATOCRIT: 44 % (ref 36.0–46.0)
Hemoglobin: 14.7 g/dL (ref 12.0–15.0)
LYMPHS ABS: 1.7 10*3/uL (ref 0.7–4.0)
LYMPHS PCT: 16 %
MCH: 31.3 pg (ref 26.0–34.0)
MCHC: 33.4 g/dL (ref 30.0–36.0)
MCV: 93.6 fL (ref 78.0–100.0)
MONO ABS: 0.7 10*3/uL (ref 0.1–1.0)
MONOS PCT: 7 %
NEUTROS ABS: 8.4 10*3/uL — AB (ref 1.7–7.7)
Neutrophils Relative %: 75 %
Platelets: 309 10*3/uL (ref 150–400)
RBC: 4.7 MIL/uL (ref 3.87–5.11)
RDW: 15.8 % — AB (ref 11.5–15.5)
WBC: 11 10*3/uL — ABNORMAL HIGH (ref 4.0–10.5)

## 2016-01-08 LAB — COMPREHENSIVE METABOLIC PANEL
ALK PHOS: 82 U/L (ref 38–126)
ALT: 39 U/L (ref 14–54)
AST: 49 U/L — AB (ref 15–41)
Albumin: 2.8 g/dL — ABNORMAL LOW (ref 3.5–5.0)
Anion gap: 12 (ref 5–15)
BILIRUBIN TOTAL: 1.6 mg/dL — AB (ref 0.3–1.2)
BUN: 24 mg/dL — AB (ref 6–20)
CALCIUM: 8.4 mg/dL — AB (ref 8.9–10.3)
CO2: 26 mmol/L (ref 22–32)
CREATININE: 0.89 mg/dL (ref 0.44–1.00)
Chloride: 104 mmol/L (ref 101–111)
GFR calc Af Amer: >60 mL/min (ref >60)
GLUCOSE: 117 mg/dL — AB (ref 65–99)
Potassium: 3.5 mmol/L (ref 3.5–5.1)
Sodium: 142 mmol/L (ref 135–145)
TOTAL PROTEIN: 6 g/dL — AB (ref 6.5–8.1)

## 2016-01-08 LAB — C DIFFICILE QUICK SCREEN W PCR REFLEX
C DIFFICILE (CDIFF) TOXIN: NEGATIVE
C DIFFICLE (CDIFF) ANTIGEN: POSITIVE — AB

## 2016-01-08 LAB — TROPONIN I: TROPONIN I: 0.03 ng/mL (ref <0.031)

## 2016-01-08 LAB — BRAIN NATRIURETIC PEPTIDE: B Natriuretic Peptide: 748.1 pg/mL — ABNORMAL HIGH (ref 0.0–100.0)

## 2016-01-08 LAB — MRSA PCR SCREENING: MRSA BY PCR: NEGATIVE

## 2016-01-08 MED ORDER — DILTIAZEM HCL 25 MG/5ML IV SOLN
5.0000 mg | Freq: Once | INTRAVENOUS | Status: AC
  Administered 2016-01-08: 5 mg via INTRAVENOUS
  Filled 2016-01-08: qty 5

## 2016-01-08 MED ORDER — PNEUMOCOCCAL VAC POLYVALENT 25 MCG/0.5ML IJ INJ
0.5000 mL | INJECTION | INTRAMUSCULAR | Status: DC
  Filled 2016-01-08: qty 0.5

## 2016-01-08 MED ORDER — AMIODARONE HCL IN DEXTROSE 360-4.14 MG/200ML-% IV SOLN
60.0000 mg/h | INTRAVENOUS | Status: AC
  Administered 2016-01-08: 60 mg/h via INTRAVENOUS
  Filled 2016-01-08 (×2): qty 200

## 2016-01-08 MED ORDER — FUROSEMIDE 10 MG/ML IJ SOLN
20.0000 mg | Freq: Once | INTRAMUSCULAR | Status: AC
  Administered 2016-01-08: 20 mg via INTRAVENOUS
  Filled 2016-01-08: qty 2

## 2016-01-08 MED ORDER — METOPROLOL TARTRATE 5 MG/5ML IV SOLN
2.5000 mg | Freq: Once | INTRAVENOUS | Status: AC
  Administered 2016-01-08: 2.5 mg via INTRAVENOUS

## 2016-01-08 MED ORDER — ONDANSETRON HCL 4 MG/2ML IJ SOLN
4.0000 mg | Freq: Four times a day (QID) | INTRAMUSCULAR | Status: DC | PRN
  Administered 2016-01-09 – 2016-01-10 (×2): 4 mg via INTRAVENOUS
  Filled 2016-01-08 (×2): qty 2

## 2016-01-08 MED ORDER — AMIODARONE HCL IN DEXTROSE 360-4.14 MG/200ML-% IV SOLN
30.0000 mg/h | INTRAVENOUS | Status: DC
  Administered 2016-01-08 – 2016-01-11 (×4): 30 mg/h via INTRAVENOUS
  Filled 2016-01-08 (×5): qty 200

## 2016-01-08 MED ORDER — VANCOMYCIN 50 MG/ML ORAL SOLUTION
125.0000 mg | Freq: Four times a day (QID) | ORAL | Status: DC
  Administered 2016-01-08 – 2016-01-09 (×5): 125 mg via ORAL
  Filled 2016-01-08 (×7): qty 2.5

## 2016-01-08 MED ORDER — DILTIAZEM HCL 25 MG/5ML IV SOLN
5.0000 mg | Freq: Once | INTRAVENOUS | Status: AC
  Administered 2016-01-08: 5 mg via INTRAVENOUS

## 2016-01-08 MED ORDER — AMIODARONE LOAD VIA INFUSION
150.0000 mg | Freq: Once | INTRAVENOUS | Status: AC
  Administered 2016-01-08: 150 mg via INTRAVENOUS
  Filled 2016-01-08: qty 83.34

## 2016-01-08 MED ORDER — LOPERAMIDE HCL 2 MG PO CAPS
4.0000 mg | ORAL_CAPSULE | Freq: Once | ORAL | Status: AC
  Administered 2016-01-08: 4 mg via ORAL
  Filled 2016-01-08: qty 2

## 2016-01-08 MED ORDER — SODIUM CHLORIDE 0.9 % IV SOLN
INTRAVENOUS | Status: DC
  Administered 2016-01-08: 08:00:00 via INTRAVENOUS

## 2016-01-08 MED ORDER — METOPROLOL TARTRATE 5 MG/5ML IV SOLN
2.5000 mg | Freq: Once | INTRAVENOUS | Status: AC
  Administered 2016-01-08: 2.5 mg via INTRAVENOUS
  Filled 2016-01-08: qty 5

## 2016-01-08 MED ORDER — METOPROLOL TARTRATE 5 MG/5ML IV SOLN
INTRAVENOUS | Status: AC
  Filled 2016-01-08: qty 5

## 2016-01-08 MED ORDER — HEPARIN (PORCINE) IN NACL 100-0.45 UNIT/ML-% IJ SOLN
800.0000 [IU]/h | INTRAMUSCULAR | Status: DC
  Administered 2016-01-08: 700 [IU]/h via INTRAVENOUS
  Filled 2016-01-08: qty 250

## 2016-01-08 MED ORDER — METOPROLOL TARTRATE 5 MG/5ML IV SOLN
5.0000 mg | Freq: Once | INTRAVENOUS | Status: AC
  Administered 2016-01-08: 5 mg via INTRAVENOUS
  Filled 2016-01-08: qty 5

## 2016-01-08 MED ORDER — ACETAMINOPHEN 325 MG PO TABS: 650.0000 mg | ORAL_TABLET | ORAL | Status: DC | PRN

## 2016-01-08 MED ORDER — HEPARIN (PORCINE) IN NACL 100-0.45 UNIT/ML-% IJ SOLN
700.0000 [IU]/h | INTRAMUSCULAR | Status: DC
  Filled 2016-01-08: qty 250

## 2016-01-08 NOTE — ED Notes (Signed)
Lab to draw remaining labs

## 2016-01-08 NOTE — ED Notes (Signed)
Cardiology at bedside.

## 2016-01-08 NOTE — ED Notes (Signed)
Pt in with c/o sob and AFib. Pt was recently hospitalized for same 1 mo ago and supposed to get a heart cath this past Friday but procedure didn't happen d/t n/v/d. Pt currently c/o sob, cp and dizziness. A&Ox4, able to speak in complete sentences. HR currently in 120's-170's, O2 sats 100% on RA.

## 2016-01-08 NOTE — H&P (Signed)
Date: 01/08/2016               Patient Name:  Sheila Branch MRN: [id number]  DOB: 1971-01-19 Age / Sex: 45 y.o., female   PCP: Pete Glatter, MD         Medical Service: Internal Medicine Teaching Service         Attending Physician: Dr. Levert Feinstein, MD    First Contact: Dr. Kyung Rudd Pager: [phone]  Second Contact: Dr. 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  She states her heart is burning, which then causes her body to burn, and is a/w SOB, hot flashes, and palpitations.  For the last two weeks, she has had nonproductive cough, 4 lb weight gain, worsening 3 pillow orthopnea, and LE edema L>R.  She quit smoking 4 months ago (previously 23 years x 1ppd).  She usually takes her PRN Lasix 20 mg every other day, but has been taking it daily for the last week at the direction of her doctor.  She has been compliant with her Xarelto and Metoprolol.  She has "tried" to adhere to low salt diet, and has been trying to drink lots of fluids (2-3 bottles of water, 1 ginger ale).  She denies coffee or other caffeine intake.  Her stress level is currently OK, but her anxiety has recently been increased surrounding a scheduled heart cath.  Two weeks ago, she also had multiple episodes of diarrhea per day.  Her family member also had diarrhea at that time.  It lasted 4-5 days before resolving.  However, it returned Thursday.  She reports loose, now watery stools, with and at least 3 episodes daily as well as squirts a/w coughing.  She denies blood or melena.  She denies  current sick contacts, abdominal pain, or antibiotics.  Of note, she was discharged 3/2 from admission for CAP, at which time received 5 day course of Levaquin.  She was recently diagnosed with afib/aflutter in Feb requiring cardioversion for rhythm control.  TTE/TEE demonstrated EF 40-45%, diffuse hypokinesis, severe AS with subvalvular calcifications and severe AR, severe LA dilation, and PA pressure 50 mmHg.  She was scheduled for Myoview stress test, which was cancelled due to her diarrhea.  She was rescheduled for cath, which was also cancelled due to recurrent diarrhea.   She has a h/o CHD, reported as "atrial valves" and murmurs, requiring multiple surgeries as a child (in [place], [place]).  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  She does not have records.    In the ED, patient was found to be in afib with RVR. IV Metoprolol and Diltiazem were unable to provide rate control.  Cardiology consult transitioned patient to IV Amiodarone with probable cardioversion tomorrow. Her BNP was elevated to 748.  Meds: Current Facility-Administered Medications  Medication Dose Route Frequency Provider Last Rate Last Dose  . acetaminophen (TYLENOL) tablet 650 mg  650 mg Oral Q4H PRN Lora Paula, MD      . amiodarone (NEXTERONE PREMIX) 360-4.14 MG/200ML-% (1.8  mg/mL) IV infusion  60 mg/hr Intravenous Continuous Chrystie Nose, MD       Followed by  . amiodarone (NEXTERONE PREMIX) 360-4.14 MG/200ML-% (1.8 mg/mL) IV infusion  30 mg/hr Intravenous Continuous Chrystie Nose, MD      . metoprolol (LOPRESSOR) 5 MG/5ML injection           . metoprolol tartrate (LOPRESSOR) tablet 25 mg  25 mg Oral Once Tiffany S Noel, PA-C      . ondansetron [facility]) injection 4 mg  4 mg Intravenous Q6H PRN Lora Paula, MD       Current Outpatient Prescriptions  Medication Sig Dispense Refill  . furosemide (LASIX) 20 MG tablet Take 20 mg by mouth daily as needed. FOR SWELLING ONLY    . metoprolol succinate (TOPROL XL) 100  MG 24 hr tablet Take 1 tablet (100 mg total) by mouth daily. Take with or immediately following a meal. 30 tablet 10  . rivaroxaban (XARELTO) 20 MG TABS tablet Take 1 tablet (20 mg total) by mouth daily with supper. (Patient taking differently: Take 20 mg by mouth every evening. ) 90 tablet 3  . triamcinolone (KENALOG) 0.1 % paste Use as directed 1 application in the mouth or throat 2 (two) times daily. (Patient not taking: Reported on 01/06/2016) 5 g 12    Allergies: Allergies as of 01/08/2016 - Review Complete 01/08/2016  Allergen Reaction Noted  . Penicillins cross reactors Swelling and Other (See Comments) 07/24/2011   Past Medical History  Diagnosis Date  . Mitral and aortic heart valve diseases, unspecified    Past Surgical History  Procedure Laterality Date  . Cardiac surgery    . Cesarean section    . Rectal surgery    . Tubal ligation    . Cardioversion N/A 11/09/2015    Procedure: CARDIOVERSION;  Surgeon: Lars Masson, MD;  Location: [facility] ENDOSCOPY;  Service: Cardiovascular;  Laterality: N/A;  . Tee without cardioversion N/A 11/09/2015    Procedure: TRANSESOPHAGEAL ECHOCARDIOGRAM (TEE);  Surgeon: Lars Masson, MD;  Location: [facility] ENDOSCOPY;  Service: Cardiovascular;  Laterality: N/A;   Family History  Problem Relation Age of Onset  . Heart disease Mother     "i think she died of heart issue"   Social History   Social History  . Marital Status: Single    Spouse Name: N/A  . Number of Children: N/A  . Years of Education: N/A   Occupational History  . Not on file.   Social History Main Topics  . Smoking status: Former Smoker -- 1.00 packs/day for 23 years    Types: Cigarettes    Quit date: 09/12/2015  . Smokeless tobacco: Not on file  . Alcohol Use: No     Comment: occasionally  . Drug Use: Yes    Special: Marijuana     Comment: occasionally  . Sexual Activity: Not on file   Other Topics Concern  . Not on file   Social History Narrative    Review of  Systems: Pertinent items noted in HPI and remainder of comprehensive ROS otherwise negative.  Physical Exam: Blood pressure 113/76, pulse 131, temperature 97.8 F (36.6 C), temperature source Oral, resp. rate 21, height 5\' 3"  (1.6 m), weight 103 lb (46.72 kg), last menstrual period 12/12/2015, SpO2 98 %. Physical Exam  Constitutional: She is oriented to person, place, and time and well-developed, well-nourished, and in no distress. No distress.  HENT:  Head: Normocephalic and atraumatic.  Eyes: EOM are normal. No  scleral icterus.  Neck: No tracheal deviation present.  Neck veins distended.  Unable to visualize IJ, but EJ distended to ear at 90 degrees.  Cardiovascular: Intact distal pulses.   Irregularly irregular rhythm. Tachycardic.  Grade III/VI pan systolic murmur heard best at RUSB. Grade II/VI decrescendo diastolic murmur heard best at RUSB.  Pulmonary/Chest: Effort normal. No stridor.  Decreased breath sounds in bilateral bases. Moderate crackles to midlung zones.  Abdominal: Soft. She exhibits no distension. There is no rebound and no guarding.  Tender to deep palpation in RUQ.  Liver edge palpable 3-4 cm below costal margin in midclavicular line.  Musculoskeletal:  1+ edema to knee bilaterally.  Neurological: She is alert and oriented to person, place, and time.  Skin: Skin is warm. She is not diaphoretic.     Lab results: Basic Metabolic Panel:  Recent Labs  19/41/74 1002  NA 142  K 3.5  CL 104  CO2 26  GLUCOSE 117*  BUN 24*  CREATININE 0.89  CALCIUM 8.4*   Liver Function Tests:  Recent Labs  01/08/16 1002  AST 49*  ALT 39  ALKPHOS 82  BILITOT 1.6*  PROT 6.0*  ALBUMIN 2.8*   No results for input(s): LIPASE, AMYLASE in the last 72 hours. No results for input(s): AMMONIA in the last 72 hours. CBC:  Recent Labs  01/08/16 0750  WBC 11.0*  NEUTROABS 8.4*  HGB 14.7  HCT 44.0  MCV 93.6  PLT 309   Cardiac Enzymes:  Recent Labs  01/08/16 1002    TROPONINI 0.03   BNP: No results for input(s): PROBNP in the last 72 hours. D-Dimer: No results for input(s): DDIMER in the last 72 hours. CBG: No results for input(s): GLUCAP in the last 72 hours. Hemoglobin A1C: No results for input(s): HGBA1C in the last 72 hours. Fasting Lipid Panel: No results for input(s): CHOL, HDL, LDLCALC, TRIG, CHOLHDL, LDLDIRECT in the last 72 hours. Thyroid Function Tests: No results for input(s): TSH, T4TOTAL, FREET4, T3FREE, THYROIDAB in the last 72 hours. Anemia Panel: No results for input(s): VITAMINB12, FOLATE, FERRITIN, TIBC, IRON, RETICCTPCT in the last 72 hours. Coagulation: No results for input(s): LABPROT, INR in the last 72 hours. Urine Drug Screen: Drugs of Abuse     Component Value Date/Time   LABOPIA POSITIVE* 11/04/2015 1245   COCAINSCRNUR NONE DETECTED 11/04/2015 1245   LABBENZ NONE DETECTED 11/04/2015 1245   AMPHETMU NONE DETECTED 11/04/2015 1245   THCU POSITIVE* 11/04/2015 1245   LABBARB NONE DETECTED 11/04/2015 1245    Alcohol Level: No results for input(s): ETH in the last 72 hours. Urinalysis: No results for input(s): COLORURINE, LABSPEC, PHURINE, GLUCOSEU, HGBUR, BILIRUBINUR, KETONESUR, PROTEINUR, UROBILINOGEN, NITRITE, LEUKOCYTESUR in the last 72 hours.  Invalid input(s): APPERANCEUR Misc. Labs:   Imaging results:  Dg Chest Port 1 View  01/08/2016  CLINICAL DATA:  Atrial fibrillation.  Short of breath. EXAM: PORTABLE CHEST 1 VIEW COMPARISON:  12/22/2015 FINDINGS: Severe cardiomegaly. Hyperaeration. Vascular congestion is present but improved. Small right pleural effusion. No pneumothorax. IMPRESSION: Cardiomegaly and vascular congestion are present but vascular congestion has mildly improved. Small right pleural effusion has developed. Electronically Signed   By: Jolaine Click M.D.   On: 01/08/2016 08:09    Other results: EKG: afib, TWI in V5, V6.  Assessment & Plan by Problem: Active Problems:   Atrial fibrillation  with rapid ventricular response (HCC)   Diarrhea   Cough   Atrial fibrillation (HCC)   Atrial fibrillation with RVR (HCC)  Ms.  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  Afib with RVR and Acute HFrEF exacerbation: Patient with h/o congenital valvular heart disease, severe AS and AI, and reduced EF, presenting with RVR, orthopnea, nonproductive cough, LE edema, and elevated BNP.  Acute HFrEF exacerbation likely 2/2 acute afib exacerbation, which is likely 2/2 recent diarrheal illness.  She denies excessive caffeine, but has been drinking lots of fluids and not necessarily adhering to low salt diet.  As noted in cardiology note, patient unlikely to remain in NSR without antiarrhythmic due to her severe LA dilation.  Outpatient cardiologist also wanting cath to r/o ischemic disease, which can be considered inpatient once rate/rhythm stabilized. - Cards following, appreciate rec's - Amiodarone IV, per cards - Heparin gtt - Lasix 20 mg IV once - Teley - Strict I/Os  Diarrhea: Patient complains of multiple episodes daily of watery diarrhea with incontinence.  Given recent admission for CAP and treatment with fluoroquinolone, C diff or other infectious etiology seems most likely.  Will r/o C diff if patient able to have a BM despite receiving Loperamide in the ED.  If negative, can consider stool pathogen panel. [ ]  C diff - Consider stool pathogen panel vs empiric C diff treatment  FEN/GI:  - HH - NPO @ [facility]  DVT Ppx: heparin gtt  Dispo: Disposition is deferred at this time, awaiting improvement of current medical problems. Anticipated discharge in approximately 2-3 day(s).   The patient does have a current PCP (Dawn Marland Mcalpine, MD) and does need an [facility] hospital follow-up appointment after discharge.  The patient does not have transportation limitations that hinder  transportation to clinic appointments.  Signed: Jana Half, MD, PhD 01/08/2016, 3:19 PM

## 2016-01-08 NOTE — Progress Notes (Signed)
ANTICOAGULATION CONSULT NOTE - Initial Consult  Pharmacy Consult for Heparin Indication: atrial fibrillation  Allergies  Allergen Reactions  . Penicillins Cross Reactors Swelling and Other (See Comments)    Mouth Ulcers Has patient had a PCN reaction causing immediate rash, facial/tongue/throat swelling, SOB or lightheadedness with hypotension: Yes Has patient had a PCN reaction causing severe rash involving mucus membranes or skin necrosis: Yes Has patient had a PCN reaction that required hospitalization No Has patient had a PCN reaction occurring within the last 10 years: No If all of the above answers are "NO", then may proceed with Cephalosporin use.    Patient Measurements: Height: 5\' 3"  (160 cm) Weight: 103 lb (46.72 kg) IBW/kg (Calculated) : 52.4  Heparin dosing weight 46.7 kg  Vital Signs: Temp: 97.8 F (36.6 C) (04/30 0727) Temp Source: Oral (04/30 0727) BP: 113/76 mmHg (04/30 1449) Pulse Rate: 131 (04/30 1449)  Labs:  Recent Labs  01/08/16 0750 01/08/16 1002  HGB 14.7  --   HCT 44.0  --   PLT 309  --   CREATININE  --  0.89  TROPONINI  --  0.03   Estimated Creatinine Clearance: 60.1 mL/min (by C-G formula based on Cr of 0.89).  Medical History: Past Medical History  Diagnosis Date  . Mitral and aortic heart valve diseases, unspecified    Assessment: 45yo female with PMH of Afib/flutter.  She has been on rivaroxaban prior to admit and had her last dose around 6:30-7:00pm last night.  We were asked to start her on some IV heparin while she undergoes a cardiac work up.  LABS: H/H - 14.7/44.0 and Platelets - 309K.  She has no noted bleeding complications when I spoke with her.  Goal of Therapy:  Heparin level 0.3-0.7 units/ml Monitor platelets by anticoagulation protocol: Yes   Plan:  Start IV heparin without a bolus at 700 units/hr Obtain a heparin level in 8 hours and adjust accordingly Monitor daily CBC/HL, s/s of bleeding and ongoing care  plan  Nadara Mustard, PharmD., MS Clinical Pharmacist Pager:  [phone] Thank you for allowing pharmacy to be part of this patients care team. 01/08/2016,4:27 PM  Addendum: Subsequently discontinued by provider - will discontinue labs and orders.  Nadara Mustard, PharmD., MS Clinical Pharmacist Pager:  [phone]  ADDENDUM:  Dr. Venia Minks called and clarified that they want Heparin drip started. He will re-enter Heparin pharmacy consult.   Plan: as noted above Start IV heparin without a bolus at 700 units/hr Obtain a heparin level in 8 hours and adjust accordingly Monitor daily CBC/HL, s/s of bleeding and ongoing care plan  Noah Delaine, RPh Clinical Pharmacist Pager: [phone] 01/08/2016 4:29 PM

## 2016-01-08 NOTE — ED Provider Notes (Signed)
CSN: [id number]     Arrival date & time 01/08/16  0710 History   First MD Initiated Contact with Patient 01/08/16 [phone]     Chief Complaint  Patient presents with  . Atrial Fibrillation  . Nausea  . Diarrhea     (Consider location/radiation/quality/duration/timing/severity/associated sxs/prior Treatment) HPI Comments: Patient complaining of 2 days of palpitations consistent with her atrial fibrillation. Was seen by her Dr. days ago for similar symptoms and had her dose of Lopressor increased. She takes Xarelto every day. She also does have a history of aortic valve disease due to congenital heart defect. She notes increasing dyspnea on exertion as well as nonproductive cough without fever or chills. Denies any chest pressure. No syncope or presyncope. States that her heart has not been in irregular rhythm for the past 2 days. Denies any excessive stimulant use. Called her Dr. was told to come here  Patient is a 45 y.o. female presenting with atrial fibrillation and diarrhea. The history is provided by the patient.  Atrial Fibrillation  Diarrhea   Past Medical History  Diagnosis Date  . Mitral and aortic heart valve diseases, unspecified    Past Surgical History  Procedure Laterality Date  . Cardiac surgery    . Cesarean section    . Rectal surgery    . Tubal ligation    . Cardioversion N/A 11/09/2015    Procedure: CARDIOVERSION;  Surgeon: Lars Masson, MD;  Location: [facility] ENDOSCOPY;  Service: Cardiovascular;  Laterality: N/A;  . Tee without cardioversion N/A 11/09/2015    Procedure: TRANSESOPHAGEAL ECHOCARDIOGRAM (TEE);  Surgeon: Lars Masson, MD;  Location: [facility] ENDOSCOPY;  Service: Cardiovascular;  Laterality: N/A;   Family History  Problem Relation Age of Onset  . Heart disease Mother     "i think she died of heart issue"   Social History  Substance Use Topics  . Smoking status: Former Smoker -- 1.00 packs/day for 23 years    Types: Cigarettes    Quit date: 09/12/2015   . Smokeless tobacco: None  . Alcohol Use: No     Comment: occasionally   OB History    No data available     Review of Systems  Gastrointestinal: Positive for diarrhea.  All other systems reviewed and are negative.     Allergies  Penicillins cross reactors  Home Medications   Prior to Admission medications   Medication Sig Start Date End Date Taking? Authorizing Provider  acetaminophen (TYLENOL) 325 MG tablet Take 325 mg by mouth every 6 (six) hours as needed for mild pain. Reported on 11/21/2015    Historical Provider, MD  furosemide (LASIX) 20 MG tablet Take 20 mg by mouth daily as needed. FOR SWELLING ONLY    Historical Provider, MD  metoprolol succinate (TOPROL XL) 100 MG 24 hr tablet Take 1 tablet (100 mg total) by mouth daily. Take with or immediately following a meal. 12/12/15   Dyann Kief, PA-C  rivaroxaban (XARELTO) 20 MG TABS tablet Take 1 tablet (20 mg total) by mouth daily with supper. Patient taking differently: Take 20 mg by mouth every evening.  01/02/16   Leone Brand, NP  triamcinolone (KENALOG) 0.1 % paste Use as directed 1 application in the mouth or throat 2 (two) times daily. Patient not taking: Reported on 01/06/2016 11/16/15   Pete Glatter, MD   BP 103/78 mmHg  Pulse 131  Temp(Src) 97.8 F (36.6 C) (Oral)  Ht  (1.6 m)  Wt 46.72 kg  BMI  18.25 kg/m2  SpO2 100%  LMP 12/12/2015 Physical Exam  Constitutional: She is oriented to person, place, and time. She appears well-developed and well-nourished.  Non-toxic appearance. No distress.  HENT:  Head: Normocephalic and atraumatic.  Eyes: Conjunctivae, EOM and lids are normal. Pupils are equal, round, and reactive to light.  Neck: Normal range of motion. Neck supple. No tracheal deviation present. No thyroid mass present.  Cardiovascular: Normal heart sounds.  An irregularly irregular rhythm present. Tachycardia present.  Exam reveals no gallop.   No murmur heard. Pulmonary/Chest: Effort normal.  No stridor. No respiratory distress. She has decreased breath sounds. She has no wheezes. She has rhonchi. She has no rales.  Abdominal: Soft. Normal appearance and bowel sounds are normal. She exhibits no distension. There is no tenderness. There is no rebound and no CVA tenderness.  Musculoskeletal: Normal range of motion. She exhibits no edema or tenderness.  Neurological: She is alert and oriented to person, place, and time. She has normal strength. No cranial nerve deficit or sensory deficit. GCS eye subscore is 4. GCS verbal subscore is 5. GCS motor subscore is 6.  Skin: Skin is warm and dry. No abrasion and no rash noted.  Psychiatric: She has a normal mood and affect. Her speech is normal and behavior is normal.  Nursing note and vitals reviewed.   ED Course  Procedures (including critical care time) Labs Review Labs Reviewed  TROPONIN I  CBC WITH DIFFERENTIAL/PLATELET  COMPREHENSIVE METABOLIC PANEL  BRAIN NATRIURETIC PEPTIDE    Imaging Review No results found. I have personally reviewed and evaluated these images and lab results as part of my medical decision-making.   EKG Interpretation None     ED ECG REPORT   Date: 01/08/2016  Rate: 136  Rhythm: atrial fibrillation  QRS Axis: normal  Intervals: normal  ST/T Wave abnormalities: nonspecific ST changes  Conduction Disutrbances:none  Narrative Interpretation:   Old EKG Reviewed: none available  I have personally reviewed the EKG tracing and agree with the computerized printout as noted. MDM   Final diagnoses:  None   Patient here with atrial fibrillation with rapid ventricular response. She was given Lopressor IV push multiple times with slight improvement of her heart rate. This was followed up by doses of IV Cardizem. I discussed the case with the cardiologist on call, Dr. Rennis Golden, he will come and see the patient and admit  CRITICAL CARE Performed by: Lorre Nick T Total critical care time: 60  minutes Critical care time was exclusive of separately billable procedures and treating other patients. Critical care was necessary to treat or prevent imminent or life-threatening deterioration. Critical care was time spent personally by me on the following activities: development of treatment plan with patient and/or surrogate as well as nursing, discussions with consultants, evaluation of patient's response to treatment, examination of patient, obtaining history from patient or surrogate, ordering and performing treatments and interventions, ordering and review of laboratory studies, ordering and review of radiographic studies, pulse oximetry and re-evaluation of patient's condition.      Lorre Nick, MD 01/08/16 1018

## 2016-01-08 NOTE — Progress Notes (Addendum)
ANTICOAGULATION CONSULT NOTE - Initial Consult  Pharmacy Consult for Heparin Indication: atrial fibrillation  Allergies  Allergen Reactions  . Penicillins Cross Reactors Swelling and Other (See Comments)    Mouth Ulcers Has patient had a PCN reaction causing immediate rash, facial/tongue/throat swelling, SOB or lightheadedness with hypotension: Yes Has patient had a PCN reaction causing severe rash involving mucus membranes or skin necrosis: Yes Has patient had a PCN reaction that required hospitalization No Has patient had a PCN reaction occurring within the last 10 years: No If all of the above answers are "NO", then may proceed with Cephalosporin use.    Patient Measurements: Height: 5\' 3"  (160 cm) Weight: 103 lb (46.72 kg) IBW/kg (Calculated) : 52.4  Vital Signs: Temp: 97.8 F (36.6 C) (04/30 0727) Temp Source: Oral (04/30 0727) BP: 113/76 mmHg (04/30 1449) Pulse Rate: 131 (04/30 1449)  Labs:  Recent Labs  01/08/16 0750 01/08/16 1002  HGB 14.7  --   HCT 44.0  --   PLT 309  --   CREATININE  --  0.89  TROPONINI  --  0.03   Estimated Creatinine Clearance: 60.1 mL/min (by C-G formula based on Cr of 0.89).  Medical History: Past Medical History  Diagnosis Date  . Mitral and aortic heart valve diseases, unspecified    Assessment: 45yo female with PMH of Afib/flutter.  She has been on rivaroxaban prior to admit and had her last dose around 6:30-7:00pm last night.  We were asked to start her on some IV heparin while she undergoes a cardiac work up. LABS: H/H - 14.7/44.0 and Platelets - 309K.  She has no noted bleeding complications when I spoke with her.  Goal of Therapy:  Heparin level 0.3-0.7 units/ml Monitor platelets by anticoagulation protocol: Yes   Plan:  Start IV heparin without a bolus at 700 units/hr Obtain a heparin level in 8 hours and adjust accordingly Monitor daily CBC/HL, s/s of bleeding and ongoing care plan  Nadara Mustard, PharmD.,  MS Clinical Pharmacist Pager:  [phone] Thank you for allowing pharmacy to be part of this patients care team. 01/08/2016,2:56 PM  Addendum: Subsequently discontinued by provider - will discontinue labs and orders.  Nadara Mustard, PharmD., MS Clinical Pharmacist Pager:  [phone]

## 2016-01-08 NOTE — ED Notes (Signed)
Pt. Stated, I started having a-Fib on Friday and she increase my med but has not helped. Pt. With nausea

## 2016-01-08 NOTE — Consult Note (Addendum)
CONSULTATION NOTE  Reason for Consult: A-fib with RVR  Requesting Physician: Dr. Zenia Resides  Cardiologist: Dr. Meda Coffee  HPI: This is a 45 y.o. female with a past medical history significant for recent admission for pneumonia at which time she was noted to be in atrial flutter with rapid ventricular response. A 2-D echo was performed which showed low normal LV function at 50-55%. There was moderate aortic stenosis and moderate to severe AI with a calcified subaortic membrane. Left atrium was severely dilated and PA pressures were increased up to 50 mmHg. She then underwent TEE and cardioversion which was successful. The TEE demonstrated EF of 40-45% with global hypokinesis. She was diuresed in the hospital with 20 mg Lasix for 5 days. She lost about 6 pounds. She managed to quit smoking for the time being however continues to have difficulty adhering to a healthy diet. She was seen in the office on 12/22/2015 by Cecilie Kicks, NP, who ordered an exercise Myoview. She was noted that time to be in sinus rhythm but having frequent PACs. There is concern for a high risk of developing recurrent atrial flutter. For some reason she never underwent the Myoview and was scheduled directly for cardiac catheterization last Friday. She discontinues Xarelto for 3 days, but due to her upper respiratory symptoms and diarrhea the cardiac catheterization was canceled.  She now presents with 2 days of palpitations and is noted to be in atrial fibrillation with rapid ventricular response. Prior to this, she developed cough, URI symptoms and diarrhea. She's felt generally unwell with this. Labs indicate a leukocytosis and elevated BNP of 748. Initial troponin is negative. She's had minimal improvement with IV Cardizem which is been complicated by hypotension. Given her severe atrial enlargement, is unlikely that she will maintain sinus rhythm for any period of time. Cardiology is asked to evaluate and manage rapid atrial  fibrillation.  PMHx:  Past Medical History  Diagnosis Date  . Mitral and aortic heart valve diseases, unspecified    Past Surgical History  Procedure Laterality Date  . Cardiac surgery    . Cesarean section    . Rectal surgery    . Tubal ligation    . Cardioversion N/A 11/09/2015    Procedure: CARDIOVERSION;  Surgeon: Dorothy Spark, MD;  Location: [facility];  Service: Cardiovascular;  Laterality: N/A;  . Tee without cardioversion N/A 11/09/2015    Procedure: TRANSESOPHAGEAL ECHOCARDIOGRAM (TEE);  Surgeon: Dorothy Spark, MD;  Location: [facility] ENDOSCOPY;  Service: Cardiovascular;  Laterality: N/A;    FAMHx: Family History  Problem Relation Age of Onset  . Heart disease Mother     "i think she died of heart issue"    SOCHx:  reports that she quit smoking about 3 months ago. Her smoking use included Cigarettes. She has a 23 pack-year smoking history. She does not have any smokeless tobacco history on file. She reports that she uses illicit drugs (Marijuana). She reports that she does not drink alcohol.  ALLERGIES: Allergies  Allergen Reactions  . Penicillins Cross Reactors Swelling and Other (See Comments)    Mouth Ulcers Has patient had a PCN reaction causing immediate rash, facial/tongue/throat swelling, SOB or lightheadedness with hypotension: Yes Has patient had a PCN reaction causing severe rash involving mucus membranes or skin necrosis: Yes Has patient had a PCN reaction that required hospitalization No Has patient had a PCN reaction occurring within the last 10 years: No If all of the above answers are "NO", then may proceed with  Cephalosporin use.     ROS: Pertinent items noted in HPI and remainder of comprehensive ROS otherwise negative.  HOME MEDICATIONS:   Medication List    ASK your doctor about these medications        furosemide 20 MG tablet  Commonly known as:  LASIX  Take 20 mg by mouth daily as needed. FOR SWELLING ONLY     metoprolol succinate  100 MG 24 hr tablet  Commonly known as:  TOPROL XL  Take 1 tablet (100 mg total) by mouth daily. Take with or immediately following a meal.     rivaroxaban 20 MG Tabs tablet  Commonly known as:  XARELTO  Take 1 tablet (20 mg total) by mouth daily with supper.     triamcinolone 0.1 % paste  Commonly known as:  KENALOG  Use as directed 1 application in the mouth or throat 2 (two) times daily.        HOSPITAL MEDICATIONS: Prior to Admission:  (Not in a hospital admission)  VITALS: Blood pressure 108/83, pulse 120, temperature 97.8 F (36.6 C), temperature source Oral, resp. rate 15, height _0  (1.6 m), weight 103 lb (46.72 kg), last menstrual period 12/12/2015, SpO2 98 %.  PHYSICAL EXAM: General appearance: alert, flushed and no distress Neck: no carotid bruit and no JVD Lungs: diminished breath sounds bilaterally Heart: irregularly irregular rhythm and tachycardic Abdomen: soft, non-tender; bowel sounds normal; no masses,  no organomegaly Extremities: extremities normal, atraumatic, no cyanosis or edema Pulses: 2+ and symmetric Skin: Skin color, texture, turgor normal. No rashes or lesions Neurologic: Grossly normal Psych: Pleasant  LABS: Results for orders placed or performed during the hospital encounter of 01/08/16 (from the past 48 hour(s))  CBC with Differential/Platelet     Status: Abnormal   Collection Time: 01/08/16  7:50 AM  Result Value Ref Range   WBC 11.0 (H) 4.0 - 10.5 K/uL   RBC 4.70 3.87 - 5.11 MIL/uL   Hemoglobin 14.7 12.0 - 15.0 g/dL   HCT 44.0 36.0 - 46.0 %   MCV 93.6 78.0 - 100.0 fL   MCH 31.3 26.0 - 34.0 pg   MCHC 33.4 30.0 - 36.0 g/dL   RDW 15.8 (H) 11.5 - 15.5 %   Platelets 309 150 - 400 K/uL   Neutrophils Relative % 75 %   Neutro Abs 8.4 (H) 1.7 - 7.7 K/uL   Lymphocytes Relative 16 %   Lymphs Abs 1.7 0.7 - 4.0 K/uL   Monocytes Relative 7 %   Monocytes Absolute 0.7 0.1 - 1.0 K/uL   Eosinophils Relative 2 %   Eosinophils Absolute 0.2 0.0 -  0.7 K/uL   Basophils Relative 0 %   Basophils Absolute 0.0 0.0 - 0.1 K/uL  Brain natriuretic peptide     Status: Abnormal   Collection Time: 01/08/16  7:50 AM  Result Value Ref Range   B Natriuretic Peptide 748.1 (H) 0.0 - 100.0 pg/mL  Comprehensive metabolic panel     Status: Abnormal   Collection Time: 01/08/16 10:02 AM  Result Value Ref Range   Sodium 142 135 - 145 mmol/L   Potassium 3.5 3.5 - 5.1 mmol/L   Chloride 104 101 - 111 mmol/L   CO2 26 22 - 32 mmol/L   Glucose, Bld 117 (H) 65 - 99 mg/dL   BUN 24 (H) 6 - 20 mg/dL   Creatinine, Ser 0.89 0.44 - 1.00 mg/dL   Calcium 8.4 (L) 8.9 - 10.3 mg/dL   Total Protein 6.0 (L)  6.5 - 8.1 g/dL   Albumin 2.8 (L) 3.5 - 5.0 g/dL   AST 49 (H) 15 - 41 U/L   ALT 39 14 - 54 U/L   Alkaline Phosphatase 82 38 - 126 U/L   Total Bilirubin 1.6 (H) 0.3 - 1.2 mg/dL   GFR calc non Af Amer >60 >60 mL/min   GFR calc Af Amer >60 >60 mL/min    Comment: (NOTE) The eGFR has been calculated using the CKD EPI equation. This calculation has not been validated in all clinical situations. eGFR's persistently <60 mL/min signify possible Chronic Kidney Disease.    Anion gap 12 5 - 15  Troponin I     Status: None   Collection Time: 01/08/16 10:02 AM  Result Value Ref Range   Troponin I 0.03 <0.031 ng/mL    Comment:        NO INDICATION OF MYOCARDIAL INJURY.     IMAGING: Dg Chest Port 1 View  01/08/2016  CLINICAL DATA:  Atrial fibrillation.  Short of breath. EXAM: PORTABLE CHEST 1 VIEW COMPARISON:  12/22/2015 FINDINGS: Severe cardiomegaly. Hyperaeration. Vascular congestion is present but improved. Small right pleural effusion. No pneumothorax. IMPRESSION: Cardiomegaly and vascular congestion are present but vascular congestion has mildly improved. Small right pleural effusion has developed. Electronically Signed   By: Marybelle Killings M.D.   On: 01/08/2016 08:09    HOSPITAL DIAGNOSES: Active Problems:   Atrial fibrillation with rapid ventricular response  (HCC)   Diarrhea   Cough   IMPRESSION: 1. A. fib with RVR 2. Diarrhea and nausea 3. Cough, URI symptoms and leukocytosis 4. Severe AI with subaortic membrane 5. Mild to moderate tricuspid regurgitation 6. Cardiomyopathy with EF of 40-45% 7. Small right pleural effusion  RECOMMENDATION: 1. Mrs. Foronda presents with atrial fibrillation and rapid ventricular response. This is not surprising given severe atrial enlargement as well as valvular heart disease. Unfortunately, she never underwent cardiac catheterization this past Friday due to URI symptoms, diarrhea and nausea. She initially attributed this to Xarelto, but the symptoms persisted even though she held Xarelto for 3 days. She is unlikely to maintain sinus rhythm with repeat cardioversion, not on antiarrhythmic therapy. I would recommend starting amiodarone for rate control since her coronary artery status is unknown. We will start IV heparin in place of Xarelto and pursue TEE-cardioversion once she is adequately loaded on amiodarone this week. Afterwards, it would be reasonable to perform her scheduled heart catheterization if she is clinically improved. We will ask medicine for their assistance in admission and workup of URI symptoms, nausea and diarrhea. She will likely need gentle diuresis if blood pressure will allow.  Time Spent Directly with Patient: 45 minutes  Pixie Casino, MD, [facility] Attending Cardiologist [place] 01/08/2016, 2:26 PM

## 2016-01-09 ENCOUNTER — Ambulatory Visit: Payer: Self-pay | Admitting: Internal Medicine

## 2016-01-09 DIAGNOSIS — A09 Infectious gastroenteritis and colitis, unspecified: Secondary | ICD-10-CM

## 2016-01-09 DIAGNOSIS — I5043 Acute on chronic combined systolic (congestive) and diastolic (congestive) heart failure: Secondary | ICD-10-CM | POA: Diagnosis present

## 2016-01-09 DIAGNOSIS — I480 Paroxysmal atrial fibrillation: Secondary | ICD-10-CM

## 2016-01-09 LAB — COMPREHENSIVE METABOLIC PANEL
ALBUMIN: 2.7 g/dL — AB (ref 3.5–5.0)
ALT: 49 U/L (ref 14–54)
AST: 62 U/L — AB (ref 15–41)
Alkaline Phosphatase: 71 U/L (ref 38–126)
Anion gap: 14 (ref 5–15)
BUN: 31 mg/dL — AB (ref 6–20)
CHLORIDE: 103 mmol/L (ref 101–111)
CO2: 18 mmol/L — AB (ref 22–32)
CREATININE: 1.37 mg/dL — AB (ref 0.44–1.00)
Calcium: 8.1 mg/dL — ABNORMAL LOW (ref 8.9–10.3)
GFR calc non Af Amer: 46 mL/min — ABNORMAL LOW (ref >60)
GFR, EST AFRICAN AMERICAN: 54 mL/min — AB (ref >60)
GLUCOSE: 142 mg/dL — AB (ref 65–99)
Potassium: 4.3 mmol/L (ref 3.5–5.1)
SODIUM: 135 mmol/L (ref 135–145)
Total Bilirubin: 1.4 mg/dL — ABNORMAL HIGH (ref 0.3–1.2)
Total Protein: 5.8 g/dL — ABNORMAL LOW (ref 6.5–8.1)

## 2016-01-09 LAB — CBC
HCT: 34.7 % — ABNORMAL LOW (ref 36.0–46.0)
Hemoglobin: 11.9 g/dL — ABNORMAL LOW (ref 12.0–15.0)
MCH: 32.1 pg (ref 26.0–34.0)
MCHC: 34.3 g/dL (ref 30.0–36.0)
MCV: 93.5 fL (ref 78.0–100.0)
PLATELETS: 184 10*3/uL (ref 150–400)
RBC: 3.71 MIL/uL — AB (ref 3.87–5.11)
RDW: 16 % — ABNORMAL HIGH (ref 11.5–15.5)
WBC: 9.5 10*3/uL (ref 4.0–10.5)

## 2016-01-09 LAB — HEPARIN LEVEL (UNFRACTIONATED)
HEPARIN UNFRACTIONATED: 2 [IU]/mL — AB (ref 0.30–0.70)
Heparin Unfractionated: >2.2 IU/mL — ABNORMAL HIGH (ref 0.30–0.70)

## 2016-01-09 LAB — APTT
APTT: 78 s — AB (ref 24–37)
aPTT: 57 seconds — ABNORMAL HIGH (ref 24–37)
aPTT: >200 seconds (ref 24–37)

## 2016-01-09 LAB — CLOSTRIDIUM DIFFICILE BY PCR: Toxigenic C. Difficile by PCR: NEGATIVE

## 2016-01-09 MED ORDER — SODIUM CHLORIDE 0.9% FLUSH
3.0000 mL | Freq: Two times a day (BID) | INTRAVENOUS | Status: DC
  Administered 2016-01-09 – 2016-01-11 (×2): 3 mL via INTRAVENOUS

## 2016-01-09 MED ORDER — SODIUM CHLORIDE 0.9 % IV SOLN: 250.0000 mL | INTRAVENOUS | Status: DC | PRN

## 2016-01-09 MED ORDER — SODIUM CHLORIDE 0.9 % WEIGHT BASED INFUSION: 1.0000 mL/kg/h | INTRAVENOUS | Status: DC

## 2016-01-09 MED ORDER — SODIUM CHLORIDE 0.9% FLUSH: 3.0000 mL | INTRAVENOUS | Status: DC | PRN

## 2016-01-09 MED ORDER — HEPARIN (PORCINE) IN NACL 100-0.45 UNIT/ML-% IJ SOLN
700.0000 [IU]/h | INTRAMUSCULAR | Status: DC
  Administered 2016-01-09 – 2016-01-10 (×2): 600 [IU]/h via INTRAVENOUS
  Filled 2016-01-09: qty 250

## 2016-01-09 MED ORDER — ASPIRIN 81 MG PO CHEW
81.0000 mg | CHEWABLE_TABLET | ORAL | Status: DC
  Filled 2016-01-09: qty 1

## 2016-01-09 NOTE — Progress Notes (Signed)
ANTICOAGULATION CONSULT NOTE - FOLLOW UP  Pharmacy Consult:  Heparin Indication: atrial fibrillation  Allergies  Allergen Reactions  . Penicillins Cross Reactors Swelling and Other (See Comments)    Mouth Ulcers Has patient had a PCN reaction causing immediate rash, facial/tongue/throat swelling, SOB or lightheadedness with hypotension: Yes Has patient had a PCN reaction causing severe rash involving mucus membranes or skin necrosis: Yes Has patient had a PCN reaction that required hospitalization No Has patient had a PCN reaction occurring within the last 10 years: No If all of the above answers are "NO", then may proceed with Cephalosporin use.    Patient Measurements: Height: 5\' 3"  (160 cm) Weight: 108 lb 9.6 oz (49.261 kg) IBW/kg (Calculated) : 52.4  Heparin dosing weight = 47 kg  Vital Signs: Temp: 98 F (36.7 C) (05/01 2027) Temp Source: Oral (05/01 2027) BP: 129/65 mmHg (05/01 2027) Pulse Rate: 79 (05/01 1300)  Labs:  Recent Labs  01/08/16 0750 01/08/16 1002 01/09/16 0028 01/09/16 0759 01/09/16 1033 01/09/16 1953  HGB 14.7  --  11.9*  --   --   --   HCT 44.0  --  34.7*  --   --   --   PLT 309  --  184  --   --   --   APTT  --   --  57*  --  >200* 78*  HEPARINUNFRC  --   --  2.00* >2.20*  --   --   CREATININE  --  0.89 1.37*  --   --   --   TROPONINI  --  0.03  --   --   --   --    Estimated Creatinine Clearance: 41.2 mL/min (by C-G formula based on Cr of 1.37).  Assessment: 55 YOF with history of Afib to continue on IV heparin while Xarelto is on hold.  Currently using aPTT to dose heparin as Xarelto falsely elevates heparin levels.    aPTT is therapeutic at 78 sec. No bleeding noted.  Goal of Therapy:  Heparin level 0.3-0.7 units/ml  APTT 66 - 102 sec Monitor platelets by anticoagulation protocol: Yes   Plan:  - Continue heparin drip at 600 units/hr - Daily HL / CBC / aPTT - Consider discontinuing PO Vanc as C.diff is negative (just colonized per  result)   Loura Back, Pharm.D., BCPS Clinical Pharmacist Pager: [phone] 01/09/2016 8:42 PM

## 2016-01-09 NOTE — Progress Notes (Addendum)
Patient Profile: 45 y/o female with h/o PAF, on Xarelto, s/p recent DCCV 11/09/2015, moderate aortic stenosis, moderate to severe AI and mild to moderate TR with a calcified subaortic membrane on recent with EF of 40-45%,  admitted for recurrent atrial fibrillation w/ RVR in the setting of recent URI. Of note, patient was scheduled to undergo outpatient LHC last Friday, 4/28, however canceled due to URI. She was started on Amiodarone and converted to NSR. Eliquis is on hold. She is on IV heparin. Also tested + for c-diff. Currently on antibiotics.   Subjective: Feels better. No diarrhea overnight.    Objective: Vital signs in last 24 hours: Temp:  [97.4 F (36.3 C)-97.8 F (36.6 C)] 97.8 F (36.6 C) (05/01 0421) Pulse Rate:  [101-134] 120 (04/30 1500) Resp:  [14-28] 16 (05/01 0421) BP: (101-125)/(50-85) 125/64 mmHg (05/01 0421) SpO2:  [96 %-100 %] 100 % (05/01 0421) Weight:  [108 lb 9.6 oz (49.261 kg)] 108 lb 9.6 oz (49.261 kg) (05/01 0421) Last BM Date: 01/08/16  Intake/Output from previous day: 04/30 0701 - 05/01 0700 In: 480 [P.O.:480] Out: 125 [Urine:125] Intake/Output this shift:    Medications Current Facility-Administered Medications  Medication Dose Route Frequency Provider Last Rate Last Dose  . acetaminophen (TYLENOL) tablet 650 mg  650 mg Oral Q4H PRN Lora Paula, MD      . amiodarone (NEXTERONE PREMIX) 360-4.14 MG/200ML-% (1.8 mg/mL) IV infusion  30 mg/hr Intravenous Continuous Chrystie Nose, MD 16.7 mL/hr at 01/08/16 2053 30 mg/hr at 01/08/16 2053  . heparin ADULT infusion 100 units/mL (25000 units/250 mL)  800 Units/hr Intravenous Continuous Stevphen Rochester, RPH 8 mL/hr at 01/09/16 0326 800 Units/hr at 01/09/16 0326  . ondansetron (ZOFRAN) injection 4 mg  4 mg Intravenous Q6H PRN Lora Paula, MD      . pneumococcal 23 valent vaccine (PNU-IMMUNE) injection 0.5 mL  0.5 mL Intramuscular Tomorrow-1000 Levert Feinstein, MD      . vancomycin (VANCOCIN) 50  mg/mL oral solution 125 mg  125 mg Oral QID Lora Paula, MD   125 mg at 01/08/16 2047    PE: General appearance: alert, cooperative and no distress Neck: no carotid bruit and no JVD Lungs: clear to auscultation bilaterally Heart: regular rate and rhythm and AS and AI murmur heard throughout the precordium, loudest along RUSB and LSB Extremities: 2+ bilateral pitting edema Pulses: 2+ and symmetric Skin: warm and dry Neurologic: Grossly normal  Lab Results:   Recent Labs  01/08/16 0750 01/09/16 0028  WBC 11.0* 9.5  HGB 14.7 11.9*  HCT 44.0 34.7*  PLT 309 184   BMET  Recent Labs  01/08/16 1002 01/09/16 0028  NA 142 135  K 3.5 4.3  CL 104 103  CO2 26 18*  GLUCOSE 117* 142*  BUN 24* 31*  CREATININE 0.89 1.37*  CALCIUM 8.4* 8.1*   Cardiac Panel (last 3 results)  Recent Labs  01/08/16 1002  TROPONINI 0.03     Assessment/Plan  Active Problems:   Atrial fibrillation with rapid ventricular response (HCC)   Diarrhea   Cough   Atrial fibrillation (HCC)   Atrial fibrillation with RVR (HCC)   Acute on chronic combined systolic and diastolic heart failure (HCC)   1. Atrial Fibrillation w/ RVR: patient converted to NSR on IV amiodarone. Convert to PO after IV load. Continue to hold Xarelto and keep on IV heparin for likely LHC this admission. Last Xarelto dose was on 01/07/16.   2. Acute  on Chronic Combined Systolic + Diastolic HF: EF 40-45% on recent TEE 11/2015. Patient still with significant bilateral LE pitting edema and remains on supplemental O2. She received a 1 x dose of lasix 20 IV yesterday, however now with AKI with bump in SCr/ BUN from 0.89/24 to 1.37/31. Will monitor. Will discuss with Dr. Katrinka Blazing additional lasix today, vs holding for possible cath.   3. C-diff:  on antibiotics and contact precautions. Patient reports diarrhea has slowed. Continue management per IM.   Keep NPO for now, as we may plan of possible cath later today. Will discus with MD.       LOS: 1 day    Brittainy M. Delmer Islam 01/09/2016 9:25 AM  The patient has been seen in conjunction with Robbie Lis, PA-C. All aspects of care have been considered and discussed. The patient has been personally interviewed, examined, and all clinical data has been reviewed.   The patient presented with atrial fibrillation and now in normal sinus rhythm after starting amiodarone.  Plan will be to perform left and right heart catheterization to assess valve both left ventricular and right heart hemodynamics.  No specific further cardiac evaluation until C. difficile has resolved.  With bump in creatinine, I would hold diuresis for now.  Continue IV amiodarone for now and probably start by mouth after cath.  Plan left and right heart cath in 24-48 hours. Suspect it will be in 48 hours and 924 hours.  Will need hydration prior to cath. Scheduled for 01/11/16 with Dr. Olene Floss.

## 2016-01-09 NOTE — Progress Notes (Addendum)
CRITICAL VALUE ALERT  Critical value received:  APTT  >200  Date of notification:  01/09/16  Time of notification:  12:10  Critical value read back:Yes.    Nurse who received alert:  Tommy Medal  MD notified (1st page):  Robbie Lis  Time of first page:  12:12  Responding MD:  Robbie Lis  Time MD responded:  [phone]

## 2016-01-09 NOTE — Progress Notes (Signed)
ANTICOAGULATION CONSULT NOTE - Follow Up Consult  Pharmacy Consult for Heparin (Xarelto on hold) Indication: atrial fibrillation  Patient Measurements: Height: 5\' 3"  (160 cm) Weight: 103 lb (46.72 kg) IBW/kg (Calculated) : 52.4  Vital Signs: Temp: 97.4 F (36.3 C) (04/30 2057) Temp Source: Oral (04/30 2057) BP: 109/50 mmHg (04/30 2100)  Labs:  Recent Labs  01/08/16 0750 01/08/16 1002 01/09/16 0028  HGB 14.7  --  11.9*  HCT 44.0  --  34.7*  PLT 309  --  184  APTT  --   --  57*  HEPARINUNFRC  --   --  2.00*  CREATININE  --  0.89 1.37*  TROPONINI  --  0.03  --     Estimated Creatinine Clearance: 39 mL/min (by C-G formula based on Cr of 1.37).  Assessment: Heparin while Xarelto on hold during cardiac work-up, heparin level is elevated due to Xarelto influence so we are currently using aPTT to dose the heparin. The aPTT is low this AM at 57. No issues per RN.   Goal of Therapy:  Heparin level 0.3-0.7 units/ml aPTT 66-102 seconds Monitor platelets by anticoagulation protocol: Yes   Plan:  -Increase heparin to 800 units/hr -1130 aPTT  Abran Duke 01/09/2016,3:23 AM

## 2016-01-09 NOTE — Care Management Note (Signed)
Case Management Note  Patient Details  Name: GEORGE SWENEY MRN: [id number] Date of Birth: 1970/10/04  Subjective/Objective: Pt admitted for Atrial Fib. Initiated on IV Amio gtt. Pt has [facility]CP at [facility]. Pt can get medications onsite at the Pharmacy.                     Action/Plan: CM will continue to monitor for additional needs.    Expected Discharge Date:                  Expected Discharge Plan:  Home/Self Care  In-House Referral:  NA  Discharge planning Services  CM Consult, Indigent Health Clinic, Medication Assistance  Post Acute Care Choice:  NA Choice offered to:  NA  DME Arranged:  N/A DME Agency:  NA  HH Arranged:  NA HH Agency:  NA  Status of Service:  Completed, signed off  Medicare Important Message Given:    Date Medicare IM Given:    Medicare IM give by:    Date Additional Medicare IM Given:    Additional Medicare Important Message give by:     If discussed at Long Length of Stay Meetings, dates discussed:    Additional Comments:  Gala Lewandowsky, RN 01/09/2016, 2:41 PM

## 2016-01-09 NOTE — Progress Notes (Signed)
VASCULAR LAB PRELIMINARY  PRELIMINARY  PRELIMINARY  PRELIMINARY  Left lower extremity venous duplex completed.     Left:  No evidence of DVT, superficial thrombosis, or Baker's cyst.  Jenetta Loges, RVT, RDMS 01/09/2016, 4:08 PM

## 2016-01-09 NOTE — Progress Notes (Signed)
Subjective: Sheila Branch had no acute events overnight. This morning, she feels much better. She denies heart 'burning' or other chest pain, shortness of breath. She has noticed a few intermittent palpitations but otherwise feels well. She has not had any episodes of diarrhea overnight.  Objective: Vital signs in last 24 hours: Filed Vitals:   01/08/16 1500 01/08/16 2057 01/08/16 2100 01/09/16 0421  BP: 103/76 109/50 109/50 125/64  Pulse: 120     Temp:  97.4 F (36.3 C)  97.8 F (36.6 C)  TempSrc:  Oral  Oral  Resp: Height:      Weight:    108 lb 9.6 oz (49.261 kg)  SpO2: 98% 100%  100%   Weight change:   Intake/Output Summary (Last 24 hours) at 01/09/16 1037 Last data filed at 01/09/16 0400  Gross per 24 hour  Intake    480 ml  Output    125 ml  Net    355 ml     Gen: Well-appearing, alert and oriented to person, place, and time HEENT: Oropharynx clear without erythema or exudate.  Neck: No cervical LAD, no thyromegaly or nodules, no JVD noted. CV: Normal rate, regular rhythm, III/VI systolic murmur heard best at Rt 2nd ICS that radiates to the neck and III/VI blowing diastolic murmur heard best at LSB but auscultated throughout the precordium. Pulmonary: Normal effort, coarse breath sounds heard at R lung base. L lung CTA. Abdominal: Soft, mild tenderness in RUQ with hepatomegaly 3cm below the costal margin. No splenomegaly noted. No rebound or guardinc. NABS. Extremities: Distal pulses 2+ in upper and lower extremities bilaterally, no tenderness, erythema. Pitting edema to the knees bilaterally. Neuro: CN II-XII grossly intact, no focal weakness or sensory deficits noted  Lab Results: Basic Metabolic Panel:  Recent Labs Lab 01/08/16 1002 01/09/16 0028  NA 142 135  K 3.5 4.3  CL 104 103  CO2 26 18*  GLUCOSE 117* 142*  BUN 24* 31*  CREATININE 0.89 1.37*  CALCIUM 8.4* 8.1*   Liver Function Tests:  Recent Labs Lab 01/08/16 1002 01/09/16 0028    AST 49* 62*  ALT 39 49  ALKPHOS 82 71  BILITOT 1.6* 1.4*  PROT 6.0* 5.8*  ALBUMIN 2.8* 2.7*   CBC:  Recent Labs Lab 01/04/16 1349 01/08/16 0750 01/09/16 0028  WBC 8.0 11.0* 9.5  NEUTROABS 5200 8.4*  --   HGB 12.6 14.7 11.9*  HCT 38.6 44.0 34.7*  MCV 91.9 93.6 93.5  PLT 318 309 184   Micro Results: Recent Results (from the past 240 hour(s))  C difficile quick scan w PCR reflex     Status: Abnormal   Collection Time: 01/08/16  5:08 PM  Result Value Ref Range Status   C Diff antigen POSITIVE (A) NEGATIVE Final   C Diff toxin NEGATIVE NEGATIVE Final   C Diff interpretation   Final    C. difficile present, but toxin not detected. This indicates colonization. In most cases, this does not require treatment. If patient has signs and symptoms consistent with colitis, consider treatment. Requires ENTERIC precautions.  MRSA PCR Screening     Status: None   Collection Time: 01/08/16  9:48 PM  Result Value Ref Range Status   MRSA by PCR NEGATIVE NEGATIVE Final    Comment:        The GeneXpert MRSA Assay (FDA approved for NASAL specimens only), is one component of a comprehensive MRSA colonization surveillance program. It is not intended to  diagnose MRSA infection nor to guide or monitor treatment for MRSA infections.    Assessment/Plan: Afib with RVR and Acute HFrEF exacerbation: Patient with h/o congenital valvular heart disease, severe AS and AI, and reduced EF, presenting with RVR, orthopnea, nonproductive cough, LE edema, and elevated BNP. Acute HFrEF exacerbation likely 2/2 acute afib exacerbation, which is likely 2/2 recent diarrheal illness. She denies excessive caffeine, but has been drinking lots of fluids and not necessarily adhering to low salt diet. As noted in cardiology note, patient unlikely to remain in NSR without antiarrhythmic due to her severe LA dilation.Patient converted to NSR on amio IV. - Cards following, appreciate rec's - Amiodarone IV, per cards.  Transition to PO likely - Heparin gtt - Lasix 20 mg IV x 1 - holding currently 2/2 AKI (1.37 on 5/1 from 0.89) - Telemetry - Strict I/Os  C. Diff colitis: Patient complains of multiple episodes daily of watery diarrhea with incontinence. Given recent admission for CAP and treatment with fluoroquinolone, C diff or other infectious etiology seems most likely. C. Dif antigen positive, toxin negative. Given symptoms despite equivocal testing, will treat. Symptoms currently improving. - Continue PO vanc  Dispo: Disposition is deferred at this time, awaiting improvement of current medical problems.  Anticipated discharge in approximately 1-3 day(s).   The patient does have a current PCP (Dawn Marland Mcalpine, MD) and does need an [facility] hospital follow-up appointment after discharge.  The patient does not have transportation limitations that hinder transportation to clinic appointments.   LOS: 1 day   Darrick Huntsman, MD 01/09/2016, 10:37 AM

## 2016-01-09 NOTE — Progress Notes (Signed)
ANTICOAGULATION CONSULT NOTE - FOLLOW UP  Pharmacy Consult:  Heparin Indication: atrial fibrillation  Allergies  Allergen Reactions  . Penicillins Cross Reactors Swelling and Other (See Comments)    Mouth Ulcers Has patient had a PCN reaction causing immediate rash, facial/tongue/throat swelling, SOB or lightheadedness with hypotension: Yes Has patient had a PCN reaction causing severe rash involving mucus membranes or skin necrosis: Yes Has patient had a PCN reaction that required hospitalization No Has patient had a PCN reaction occurring within the last 10 years: No If all of the above answers are "NO", then may proceed with Cephalosporin use.    Patient Measurements: Height: 5\' 3"  (160 cm) Weight: 108 lb 9.6 oz (49.261 kg) IBW/kg (Calculated) : 52.4  Heparin dosing weight = 47 kg  Vital Signs: Temp: 97.8 F (36.6 C) (05/01 0421) Temp Source: Oral (05/01 0421) BP: 125/64 mmHg (05/01 0421)  Labs:  Recent Labs  01/08/16 0750 01/08/16 1002 01/09/16 0028 01/09/16 0759  HGB 14.7  --  11.9*  --   HCT 44.0  --  34.7*  --   PLT 309  --  184  --   APTT  --   --  57*  --   HEPARINUNFRC  --   --  2.00* >2.20*  CREATININE  --  0.89 1.37*  --   TROPONINI  --  0.03  --   --    Estimated Creatinine Clearance: 41.2 mL/min (by C-G formula based on Cr of 1.37).    Assessment: 69 YOF with history of Afib to continue on IV heparin while Xarelto is on hold.  Currently using aPTT to dose heparin as Xarelto falsely elevates heparin levels.  APTT is elevated.  Verified with patient that she is not bleeding.  Lab was drawn appropriately.   Goal of Therapy:  Heparin level 0.3-0.7 units/ml  APTT 66 - 102 sec Monitor platelets by anticoagulation protocol: Yes    Plan:  - Hold heparin gtt for ~1.5 hours, then resume at 600 units/hr (RN aware) - Recheck aPTT 6 hr post heparin resumption - Daily HL / CBC / aPTT - Consider discontinuing PO Vanc as C.diff is negative (just colonized  per result)   Savana Spina D. Laney Potash, PharmD, BCPS Pager:  [phone] 01/09/2016, 12:22 PM

## 2016-01-10 DIAGNOSIS — I5041 Acute combined systolic (congestive) and diastolic (congestive) heart failure: Secondary | ICD-10-CM

## 2016-01-10 DIAGNOSIS — Z221 Carrier of other intestinal infectious diseases: Secondary | ICD-10-CM

## 2016-01-10 LAB — CBC
HCT: 35.5 % — ABNORMAL LOW (ref 36.0–46.0)
HEMOGLOBIN: 11.9 g/dL — AB (ref 12.0–15.0)
MCH: 30.7 pg (ref 26.0–34.0)
MCHC: 33.5 g/dL (ref 30.0–36.0)
MCV: 91.7 fL (ref 78.0–100.0)
PLATELETS: 212 10*3/uL (ref 150–400)
RBC: 3.87 MIL/uL (ref 3.87–5.11)
RDW: 15.9 % — ABNORMAL HIGH (ref 11.5–15.5)
WBC: 8.1 10*3/uL (ref 4.0–10.5)

## 2016-01-10 LAB — BRAIN NATRIURETIC PEPTIDE: B Natriuretic Peptide: 883.8 pg/mL — ABNORMAL HIGH (ref 0.0–100.0)

## 2016-01-10 LAB — BASIC METABOLIC PANEL
ANION GAP: 13 (ref 5–15)
BUN: 33 mg/dL — ABNORMAL HIGH (ref 6–20)
CALCIUM: 8.6 mg/dL — AB (ref 8.9–10.3)
CO2: 25 mmol/L (ref 22–32)
Chloride: 97 mmol/L — ABNORMAL LOW (ref 101–111)
Creatinine, Ser: 1.39 mg/dL — ABNORMAL HIGH (ref 0.44–1.00)
GFR, EST AFRICAN AMERICAN: 53 mL/min — AB (ref >60)
GFR, EST NON AFRICAN AMERICAN: 46 mL/min — AB (ref >60)
Glucose, Bld: 111 mg/dL — ABNORMAL HIGH (ref 65–99)
Potassium: 3.4 mmol/L — ABNORMAL LOW (ref 3.5–5.1)
Sodium: 135 mmol/L (ref 135–145)

## 2016-01-10 LAB — HEPARIN LEVEL (UNFRACTIONATED): Heparin Unfractionated: 1.22 IU/mL — ABNORMAL HIGH (ref 0.30–0.70)

## 2016-01-10 LAB — APTT: APTT: 77 s — AB (ref 24–37)

## 2016-01-10 NOTE — Progress Notes (Signed)
Subjective: Sheila Branch had no acute events overnight. This morning, she has no complaints. She denies heart 'burning' or other chest pain, shortness of breath, or palpitations. She has not had any episodes of diarrhea overnight.  Objective: Vital signs in last 24 hours: Filed Vitals:   01/09/16 0421 01/09/16 1300 01/09/16 2027 01/10/16 0500  BP: 125/64 122/79 129/65 141/63  Pulse:  79  68  Temp: 97.8 F (36.6 C) 97.9 F (36.6 C) 98 F (36.7 C) 97.8 F (36.6 C)  TempSrc: Oral Oral Oral Oral  Resp: Height:      Weight: 108 lb 9.6 oz (49.261 kg)   110 lb 11.2 oz (50.213 kg)  SpO2: 100% 100% 98% 99%   Weight change: 7 lb 11.2 oz (3.493 kg)  Intake/Output Summary (Last 24 hours) at 01/10/16 1007 Last data filed at 01/10/16 0981  Gross per 24 hour  Intake 1119.13 ml  Output    400 ml  Net 719.13 ml     Gen: Well-appearing, alert and oriented to person, place, and time HEENT: Oropharynx clear without erythema or exudate.  Neck: No cervical LAD, no thyromegaly or nodules, no JVD noted. CV: Normal rate, regular rhythm, III/VI systolic murmur heard best at Rt 2nd ICS that radiates to the neck and III/VI blowing diastolic murmur heard best at LSB but auscultated throughout the precordium. Pulmonary: Normal effort, coarse breath sounds heard at R lung base. L lung CTA. Abdominal: Soft, mild tenderness in RUQ with hepatomegaly 3cm below the costal margin. No splenomegaly noted. No rebound or guardinc. NABS. Extremities: Distal pulses 2+ in upper and lower extremities bilaterally, no tenderness, erythema. Pitting edema to the knees bilaterally. Neuro: CN II-XII grossly intact, no focal weakness or sensory deficits noted  Lab Results: Basic Metabolic Panel:  Recent Labs Lab 01/09/16 0028 01/10/16 0809  NA 135 135  K 4.3 3.4*  CL 103 97*  CO2 18* 25  GLUCOSE 142* 111*  BUN 31* 33*  CREATININE 1.37* 1.39*  CALCIUM 8.1* 8.6*   Liver Function Tests:  Recent  Labs Lab 01/08/16 1002 01/09/16 0028  AST 49* 62*  ALT 39 49  ALKPHOS 82 71  BILITOT 1.6* 1.4*  PROT 6.0* 5.8*  ALBUMIN 2.8* 2.7*   CBC:  Recent Labs Lab 01/04/16 1349 01/08/16 0750 01/09/16 0028 01/10/16 0541  WBC 8.0 11.0* 9.5 8.1  NEUTROABS 5200 8.4*  --   --   HGB 12.6 14.7 11.9* 11.9*  HCT 38.6 44.0 34.7* 35.5*  MCV 91.9 93.6 93.5 91.7  PLT 318 309 184 212   Micro Results: Recent Results (from the past 240 hour(s))  C difficile quick scan w PCR reflex     Status: Abnormal   Collection Time: 01/08/16  5:08 PM  Result Value Ref Range Status   C Diff antigen POSITIVE (A) NEGATIVE Final   C Diff toxin NEGATIVE NEGATIVE Final   C Diff interpretation   Final    C. difficile present, but toxin not detected. This indicates colonization. In most cases, this does not require treatment. If patient has signs and symptoms consistent with colitis, consider treatment. Requires ENTERIC precautions.  Clostridium Difficile by PCR     Status: None   Collection Time: 01/08/16  5:08 PM  Result Value Ref Range Status   Toxigenic C Difficile by pcr NEGATIVE NEGATIVE Final  MRSA PCR Screening     Status: None   Collection Time: 01/08/16  9:48 PM  Result Value Ref Range  Status   MRSA by PCR NEGATIVE NEGATIVE Final    Comment:        The GeneXpert MRSA Assay (FDA approved for NASAL specimens only), is one component of a comprehensive MRSA colonization surveillance program. It is not intended to diagnose MRSA infection nor to guide or monitor treatment for MRSA infections.    Assessment/Plan: Afib with RVR and Acute HFrEF exacerbation: Patient with h/o congenital valvular heart disease, severe AS and AI, and reduced EF, presenting with RVR, orthopnea, nonproductive cough, LE edema, and elevated BNP. Acute HFrEF exacerbation likely 2/2 acute afib exacerbation, which is likely 2/2 recent diarrheal illness. She denies excessive caffeine, but has been drinking lots of fluids and not  necessarily adhering to low salt diet. As noted in cardiology note, patient unlikely to remain in NSR without antiarrhythmic due to her severe LA dilation.Patient converted to NSR on amio IV. CHADs-VASc of 2 (gender, CHF), so anticoagulation is warranted. - Cards following, appreciate rec's - Amiodarone IV, per cards. Transition to PO likely - Heparin gtt - Lasix 20 mg IV x 1 - holding currently 2/2 AKI. Will consider additional diuresis as clinically indicated - Telemetry - Strict I/Os  C. Diff colitis: Patient complains of multiple episodes daily of watery diarrhea with incontinence. Given recent admission for CAP and treatment with fluoroquinolone, C diff or other infectious etiology seems most likely. C. Dif antigen positive, toxin negative. Symptoms currently improving. - D/c PO vanc, take off contact precautions -CTM  Dispo: Disposition is deferred at this time, awaiting improvement of current medical problems.  Anticipated discharge in approximately 1-3 day(s).   The patient does have a current PCP (Dawn Marland Mcalpine, MD) and does need an [facility] hospital follow-up appointment after discharge.  The patient does not have transportation limitations that hinder transportation to clinic appointments.   LOS: 2 days   Darrick Huntsman, MD 01/10/2016, 10:07 AM

## 2016-01-10 NOTE — Progress Notes (Signed)
Subjective: [name] had no acute events overnight. She says she feels well and reports no palpitations, SOB, chest pain, nausea, abdominal pain or diarrhea.  Objective: Vital signs in last 24 hours: Filed Vitals:   01/09/16 1300 01/09/16 2027 01/10/16 0500 01/10/16 0800  BP: 122/79 129/65 141/63   Pulse: 79  68   Temp: 97.9 F (36.6 C) 98 F (36.7 C) 97.8 F (36.6 C)   TempSrc: Oral Oral Oral   Resp: 18 19 18 18   Height:      Weight:   50.213 kg (110 lb 11.2 oz)   SpO2: 100% 98% 99% 98%   Weight change: 3.493 kg (7 lb 11.2 oz)  Intake/Output Summary (Last 24 hours) at 01/10/16 1049 Last data filed at 01/10/16 7096  Gross per 24 hour  Intake 1119.13 ml  Output    400 ml  Net 719.13 ml   General appearance: alert, cooperative and no distress Lungs: clear to auscultation bilaterally Heart: regularly irregular rhythm, systolic murmur: holosystolic 3/6, at 2nd right intercostal space and diastolic murmur: diastolic 2/6, at 2nd left intercostal space, at apex Abdomen: soft, non-tender; bowel sounds normal; no masses, mild hepatomegaly  Lab Results:  Basic Metabolic Panel:  Last Labs      Recent Labs Lab 01/09/16 0028 01/10/16 0809  NA 135 135  K 4.3 3.4*  CL 103 97*  CO2 18* 25  GLUCOSE 142* 111*  BUN 31* 33*  CREATININE 1.37* 1.39*  CALCIUM 8.1* 8.6*     Liver Function Tests:  Last Labs      Recent Labs Lab 01/08/16 1002 01/09/16 0028  AST 49* 62*  ALT 39 49  ALKPHOS 82 71  BILITOT 1.6* 1.4*  PROT 6.0* 5.8*  ALBUMIN 2.8* 2.7*     CBC:  Last Labs      Recent Labs Lab 01/04/16 1349 01/08/16 0750 01/09/16 0028 01/10/16 0541  WBC 8.0 11.0* 9.5 8.1  NEUTROABS 5200 8.4* --  --   HGB 12.6 14.7 11.9* 11.9*  HCT 38.6 44.0 34.7* 35.5*  MCV 91.9 93.6 93.5 91.7  PLT 318 309 184 212           Micro Results: Recent Results (from the past 240 hour(s))  C  difficile quick scan w PCR reflex     Status: Abnormal   Collection Time: 01/08/16  5:08 PM  Result Value Ref Range Status   C Diff antigen POSITIVE (A) NEGATIVE Final   C Diff toxin NEGATIVE NEGATIVE Final   C Diff interpretation   Final    C. difficile present, but toxin not detected. This indicates colonization. In most cases, this does not require treatment. If patient has signs and symptoms consistent with colitis, consider treatment. Requires ENTERIC precautions.  Clostridium Difficile by PCR     Status: None   Collection Time: 01/08/16  5:08 PM  Result Value Ref Range Status   Toxigenic C Difficile by pcr NEGATIVE NEGATIVE Final  MRSA PCR Screening     Status: None   Collection Time: 01/08/16  9:48 PM  Result Value Ref Range Status   MRSA by PCR NEGATIVE NEGATIVE Final    Comment:        The GeneXpert MRSA Assay (FDA approved for NASAL specimens only), is one component of a comprehensive MRSA colonization surveillance program. It is not intended to diagnose MRSA infection nor to guide or monitor treatment for MRSA infections.    Studies/Results: No results found. Medications:  Prior to Admission:  Facility-administered  medications prior to admission  Medication Dose Route Frequency Provider Last Rate Last Dose  . metoprolol tartrate (LOPRESSOR) tablet 25 mg  25 mg Oral Once Tiffany S Noel, PA-C       Prescriptions prior to admission  Medication Sig Dispense Refill Last Dose  . furosemide (LASIX) 20 MG tablet Take 20 mg by mouth daily as needed. FOR SWELLING ONLY   01/07/2016 at Unknown time  . metoprolol succinate (TOPROL XL) 100 MG 24 hr tablet Take 1 tablet (100 mg total) by mouth daily. Take with or immediately following a meal. 30 tablet 10 01/07/2016 at 1000  . rivaroxaban (XARELTO) 20 MG TABS tablet Take 1 tablet (20 mg total) by mouth daily with supper. (Patient taking differently: Take 20 mg by mouth every evening. ) 90 tablet 3 01/07/2016 at Unknown time  .  triamcinolone (KENALOG) 0.1 % paste Use as directed 1 application in the mouth or throat 2 (two) times daily. (Patient not taking: Reported on 01/06/2016) 5 g 12 Not Taking at Unknown time   Scheduled: . aspirin  81 mg Oral Pre-Cath  . pneumococcal 23 valent vaccine  0.5 mL Intramuscular Tomorrow-1000  . sodium chloride flush  3 mL Intravenous Q12H   Continuous: . sodium chloride    . amiodarone 30 mg/hr (01/10/16 1000)  . heparin 600 Units/hr (01/10/16 1000)   ONG:EXBMWU chloride, acetaminophen, ondansetron (ZOFRAN) IV, sodium chloride flush Anti-infectives    Start     Dose/Rate Route Frequency Ordered Stop   01/08/16 2000  vancomycin (VANCOCIN) 50 mg/mL oral solution 125 mg  Status:  Discontinued     125 mg Oral 4 times daily 01/08/16 1926 01/10/16 0839     Scheduled Meds: . aspirin  81 mg Oral Pre-Cath  . pneumococcal 23 valent vaccine  0.5 mL Intramuscular Tomorrow-1000  . sodium chloride flush  3 mL Intravenous Q12H   Continuous Infusions: . sodium chloride    . amiodarone 30 mg/hr (01/10/16 0800)  . heparin 600 Units/hr (01/10/16 0925)   PRN Meds:.sodium chloride, acetaminophen, ondansetron (ZOFRAN) IV, sodium chloride flush  Assessment/Plan: Sheila Branch is a 45 yo female with a afib with a history of congenital heart disease, being seen for an episode of afib and acute congestive heart failure exacerbation.   1. Afib with RVR adn acute HFrEF exacerbation: Patient is in sinus rhythm and reports no episodes of SOB or palpitations -cardiology is following -scheduled left and right heart catheritization for ischemic evaluation tomorrow -rate and rhythm currently controlled on IV amiordarone. Will switch to oral amiodarone today -anticoagulated on heparin -Lasix is being held due to recent increase in creatinine to 1.39 from 0.89  2. C. Diff colitis: Lab results showed antigen positive, toxin negative stool -PO vanc will be discontinued due to resolution of symptoms (no  diarrhea or abdominal pain) and per ID rec  This is a Psychologist, occupational Note.  The care of the patient was discussed with Dr. Kyung Rudd and the assessment and plan formulated with their assistance.  Please see their attached note for official documentation of the daily encounter.   LOS: 2 days   [place], Med Student 01/10/2016, 10:49 AM

## 2016-01-10 NOTE — Progress Notes (Signed)
ANTICOAGULATION CONSULT NOTE - FOLLOW UP  Pharmacy Consult:  Heparin Indication: atrial fibrillation  Allergies  Allergen Reactions  . Penicillins Cross Reactors Swelling and Other (See Comments)    Mouth Ulcers Has patient had a PCN reaction causing immediate rash, facial/tongue/throat swelling, SOB or lightheadedness with hypotension: Yes Has patient had a PCN reaction causing severe rash involving mucus membranes or skin necrosis: Yes Has patient had a PCN reaction that required hospitalization No Has patient had a PCN reaction occurring within the last 10 years: No If all of the above answers are "NO", then may proceed with Cephalosporin use.    Patient Measurements: Height: 5\' 3"  (160 cm) Weight: 110 lb 11.2 oz (50.213 kg) IBW/kg (Calculated) : 52.4  Heparin dosing weight = 47 kg  Vital Signs: Temp: 97.8 F (36.6 C) (05/02 0500) Temp Source: Oral (05/02 0500) BP: 141/63 mmHg (05/02 0500) Pulse Rate: 68 (05/02 0500)  Labs:  Recent Labs  01/08/16 0750 01/08/16 1002  01/09/16 0028 01/09/16 0759 01/09/16 1033 01/09/16 1953 01/10/16 0541  HGB 14.7  --   --  11.9*  --   --   --  11.9*  HCT 44.0  --   --  34.7*  --   --   --  35.5*  PLT 309  --   --  184  --   --   --  212  APTT  --   --   < > 57*  --  >200* 78* 77*  HEPARINUNFRC  --   --   --  2.00* >2.20*  --   --  1.22*  CREATININE  --  0.89  --  1.37*  --   --   --   --   TROPONINI  --  0.03  --   --   --   --   --   --   < > = values in this interval not displayed. Estimated Creatinine Clearance: 42 mL/min (by C-G formula based on Cr of 1.37).    Assessment: 88 YOF with history of Afib to continue on IV heparin while Xarelto is on hold.  Currently using aPTT to dose heparin as Xarelto falsely elevates heparin levels.  APTT is therapeutic; no bleeding reported.  Noted plan for cath on Wed 01/11/16.   Goal of Therapy:  Heparin level 0.3-0.7 units/ml  APTT 66 - 102 sec Monitor platelets by anticoagulation  protocol: Yes    Plan:  - Continue heparin gtt at 600 units/hr - Daily HL / CBC / aPTT - Consider discontinuing PO Vanc as C.diff is negative (just colonized per result)   Taylyn Brame D. Laney Potash, PharmD, BCPS Pager:  [phone] 01/10/2016, 8:32 AM

## 2016-01-10 NOTE — Progress Notes (Addendum)
       Patient Name: Sheila Branch Date of Encounter: 01/10/2016    SUBJECTIVE: Feels much better today. No diarrhea overnight.  TELEMETRY:  Normal sinus rhythm. Filed Vitals:   01/09/16 0421 01/09/16 1300 01/09/16 2027 01/10/16 0500  BP: 125/64 122/79 129/65 141/63  Pulse:  79  68  Temp: 97.8 F (36.6 C) 97.9 F (36.6 C) 98 F (36.7 C) 97.8 F (36.6 C)  TempSrc: Oral Oral Oral Oral  Resp: 16 18 19 18   Height:      Weight: 108 lb 9.6 oz (49.261 kg)   110 lb 11.2 oz (50.213 kg)  SpO2: 100% 100% 98% 99%    Intake/Output Summary (Last 24 hours) at 01/10/16 0856 Last data filed at 01/09/16 2047  Gross per 24 hour  Intake 879.13 ml  Output    400 ml  Net 479.13 ml   LABS: Basic Metabolic Panel:  Recent Labs  74/25/95 1002 01/09/16 0028  NA 142 135  K 3.5 4.3  CL 104 103  CO2 26 18*  GLUCOSE 117* 142*  BUN 24* 31*  CREATININE 0.89 1.37*  CALCIUM 8.4* 8.1*   CBC:  Recent Labs  01/08/16 0750 01/09/16 0028 01/10/16 0541  WBC 11.0* 9.5 8.1  NEUTROABS 8.4*  --   --   HGB 14.7 11.9* 11.9*  HCT 44.0 34.7* 35.5*  MCV 93.6 93.5 91.7  PLT 309 184 212   Cardiac Enzymes:  Recent Labs  01/08/16 1002  TROPONINI 0.03   BNP: Invalid input(s): POCBNP Hemoglobin A1C: No results for input(s): HGBA1C in the last 72 hours. Fasting Lipid Panel: No results for input(s): CHOL, HDL, LDLCALC, TRIG, CHOLHDL, LDLDIRECT in the last 72 hours.  Radiology/Studies:  Chest x-ray on the 30th demonstrated vascular congestion and a right pleural effusion.  Physical Exam: Blood pressure 141/63, pulse 68, temperature 97.8 F (36.6 C), temperature source Oral, resp. rate 18, height 5\' 3"  (1.6 m), weight 110 lb 11.2 oz (50.213 kg), last menstrual period 12/12/2015, SpO2 99 %. Weight change: 7 lb 11.2 oz (3.493 kg)  Wt Readings from Last 3 Encounters:  01/10/16 110 lb 11.2 oz (50.213 kg)  01/06/16 104 lb (47.174 kg)  01/06/16 100 lb (45.36 kg)   Systolic murmur compatible  with aortic stenosis Decreased breath sounds at the right base No peripheral edema Moderate jugular vein distention  ASSESSMENT:  1. Atrial fibrillation with rapid ventricular response, not controlled and in sinus rhythm on IV amiodarone. CHADS VASC is 2. 2. 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 Basic metabolic panel and BNP today 2. Left and right heart cath with coronary angiography planned for tomorrow 3. Seems as though she needs additional diuresis 4. Convert amiodarone to oral dosing 5.The patient was counseled to undergo left heart catheterization, coronary angiography, and possible percutaneous coronary intervention with stent implantation. The procedural risks and benefits were discussed in detail. The risks discussed included death, stroke, myocardial infarction, life-threatening bleeding, limb ischemia, kidney injury, allergy, and possible emergency cardiac surgery. The risk of these significant complications were estimated to occur less than 1% of the time. After discussion, the patient has agreed to proceed. Will need chronic anticoagulation resumed post cath  Selinda Eon 01/10/2016, 8:56 AM

## 2016-01-10 NOTE — Progress Notes (Signed)
  VASCULAR LAB PRELIMINARY  PRELIMINARY  PRELIMINARY  PRELIMINARY  Left lower extremity venous duplex completed.     Left:  No evidence of DVT, superficial thrombosis, or Baker's cyst.  Called the office, the office was closed. Called the other number that was provided and that number did not work.  Jenetta Loges, RVT, RDMS 01/10/2016, 9:31 AM

## 2016-01-11 ENCOUNTER — Encounter (HOSPITAL_COMMUNITY): Payer: Self-pay | Admitting: Cardiovascular Disease

## 2016-01-11 ENCOUNTER — Encounter (HOSPITAL_COMMUNITY)
Admission: EM | Disposition: A | Discharge status: Home / Self Care | Payer: Self-pay | Source: Home / Self Care | Attending: Oncology

## 2016-01-11 DIAGNOSIS — I5042 Chronic combined systolic (congestive) and diastolic (congestive) heart failure: Secondary | ICD-10-CM | POA: Diagnosis present

## 2016-01-11 DIAGNOSIS — I359 Nonrheumatic aortic valve disorder, unspecified: Secondary | ICD-10-CM

## 2016-01-11 DIAGNOSIS — I481 Persistent atrial fibrillation: Secondary | ICD-10-CM

## 2016-01-11 DIAGNOSIS — I351 Nonrheumatic aortic (valve) insufficiency: Secondary | ICD-10-CM

## 2016-01-11 DIAGNOSIS — Q244 Congenital subaortic stenosis: Secondary | ICD-10-CM

## 2016-01-11 HISTORY — PX: CARDIAC CATHETERIZATION: SHX172

## 2016-01-11 LAB — POCT I-STAT 3, VENOUS BLOOD GAS (G3P V)
ACID-BASE DEFICIT: 2 mmol/L (ref 0.0–2.0)
BICARBONATE: 24.3 meq/L — AB (ref 20.0–24.0)
O2 Saturation: 77 %
PH VEN: 7.34 — AB (ref 7.250–7.300)
TCO2: 26 mmol/L (ref 0–100)
pCO2, Ven: 45 mmHg (ref 45.0–50.0)
pO2, Ven: 44 mmHg (ref 31.0–45.0)

## 2016-01-11 LAB — POCT I-STAT 3, ART BLOOD GAS (G3+)
Acid-base deficit: 1 mmol/L (ref 0.0–2.0)
Bicarbonate: 25.2 mEq/L — ABNORMAL HIGH (ref 20.0–24.0)
O2 SAT: 94 %
PCO2 ART: 44.4 mmHg (ref 35.0–45.0)
PH ART: 7.361 (ref 7.350–7.450)
PO2 ART: 75 mmHg — AB (ref 80.0–100.0)
TCO2: 26 mmol/L (ref 0–100)

## 2016-01-11 LAB — BASIC METABOLIC PANEL
Anion gap: 10 (ref 5–15)
BUN: 29 mg/dL — AB (ref 6–20)
CHLORIDE: 99 mmol/L — AB (ref 101–111)
CO2: 23 mmol/L (ref 22–32)
Calcium: 8.3 mg/dL — ABNORMAL LOW (ref 8.9–10.3)
Creatinine, Ser: 1.07 mg/dL — ABNORMAL HIGH (ref 0.44–1.00)
GFR calc Af Amer: >60 mL/min (ref >60)
GFR calc non Af Amer: >60 mL/min (ref >60)
Glucose, Bld: 99 mg/dL (ref 65–99)
POTASSIUM: 3.5 mmol/L (ref 3.5–5.1)
SODIUM: 132 mmol/L — AB (ref 135–145)

## 2016-01-11 LAB — HEPARIN LEVEL (UNFRACTIONATED): HEPARIN UNFRACTIONATED: 0.48 [IU]/mL (ref 0.30–0.70)

## 2016-01-11 LAB — POCT ACTIVATED CLOTTING TIME: ACTIVATED CLOTTING TIME: 152 s

## 2016-01-11 LAB — CBC
HEMATOCRIT: 36.7 % (ref 36.0–46.0)
Hemoglobin: 12.3 g/dL (ref 12.0–15.0)
MCH: 30.9 pg (ref 26.0–34.0)
MCHC: 33.5 g/dL (ref 30.0–36.0)
MCV: 92.2 fL (ref 78.0–100.0)
Platelets: 215 10*3/uL (ref 150–400)
RBC: 3.98 MIL/uL (ref 3.87–5.11)
RDW: 15.9 % — AB (ref 11.5–15.5)
WBC: 8.8 10*3/uL (ref 4.0–10.5)

## 2016-01-11 LAB — APTT: aPTT: 64 seconds — ABNORMAL HIGH (ref 24–37)

## 2016-01-11 SURGERY — RIGHT/LEFT HEART CATH AND CORONARY ANGIOGRAPHY

## 2016-01-11 MED ORDER — HEPARIN SODIUM (PORCINE) 1000 UNIT/ML IJ SOLN
INTRAMUSCULAR | Status: AC
  Filled 2016-01-11: qty 1

## 2016-01-11 MED ORDER — HEPARIN (PORCINE) IN NACL 2-0.9 UNIT/ML-% IJ SOLN
INTRAMUSCULAR | Status: AC
  Filled 2016-01-11: qty 1500

## 2016-01-11 MED ORDER — CHLORHEXIDINE GLUCONATE 0.12 % MT SOLN
15.0000 mL | Freq: Two times a day (BID) | OROMUCOSAL | Status: DC
  Administered 2016-01-11 – 2016-01-15 (×8): 15 mL via OROMUCOSAL
  Filled 2016-01-11 (×9): qty 15

## 2016-01-11 MED ORDER — FENTANYL CITRATE (PF) 100 MCG/2ML IJ SOLN
INTRAMUSCULAR | Status: DC | PRN
  Administered 2016-01-11: 25 ug via INTRAVENOUS

## 2016-01-11 MED ORDER — IOPAMIDOL (ISOVUE-370) INJECTION 76%
INTRAVENOUS | Status: DC | PRN
  Administered 2016-01-11: 90 mL via INTRA_ARTERIAL

## 2016-01-11 MED ORDER — VERAPAMIL HCL 2.5 MG/ML IV SOLN
INTRAVENOUS | Status: DC | PRN
  Administered 2016-01-11: 14:00:00 via INTRA_ARTERIAL

## 2016-01-11 MED ORDER — HEPARIN SODIUM (PORCINE) 1000 UNIT/ML IJ SOLN
INTRAMUSCULAR | Status: DC | PRN
  Administered 2016-01-11: 3000 [IU] via INTRAVENOUS

## 2016-01-11 MED ORDER — FUROSEMIDE 10 MG/ML IJ SOLN
20.0000 mg | Freq: Once | INTRAMUSCULAR | Status: AC
  Administered 2016-01-11: 20 mg via INTRAVENOUS
  Filled 2016-01-11: qty 2

## 2016-01-11 MED ORDER — MIDAZOLAM HCL 2 MG/2ML IJ SOLN
INTRAMUSCULAR | Status: DC | PRN
Start: 2016-01-11 — End: 2016-01-11
  Administered 2016-01-11: 2 mg via INTRAVENOUS
  Administered 2016-01-11: 1 mg via INTRAVENOUS

## 2016-01-11 MED ORDER — MIDAZOLAM HCL 2 MG/2ML IJ SOLN
INTRAMUSCULAR | Status: AC
  Filled 2016-01-11: qty 2

## 2016-01-11 MED ORDER — HEPARIN (PORCINE) IN NACL 2-0.9 UNIT/ML-% IJ SOLN
INTRAMUSCULAR | Status: DC | PRN
  Administered 2016-01-11: 1000 mL

## 2016-01-11 MED ORDER — HEPARIN (PORCINE) IN NACL 100-0.45 UNIT/ML-% IJ SOLN
800.0000 [IU]/h | INTRAMUSCULAR | Status: DC
  Administered 2016-01-12: 800 [IU]/h via INTRAVENOUS
  Filled 2016-01-11: qty 250

## 2016-01-11 MED ORDER — VERAPAMIL HCL 2.5 MG/ML IV SOLN
INTRAVENOUS | Status: AC
  Filled 2016-01-11: qty 2

## 2016-01-11 MED ORDER — AMIODARONE HCL 200 MG PO TABS
200.0000 mg | ORAL_TABLET | Freq: Two times a day (BID) | ORAL | Status: DC
  Administered 2016-01-11 – 2016-01-15 (×9): 200 mg via ORAL
  Filled 2016-01-11 (×9): qty 1

## 2016-01-11 MED ORDER — SODIUM CHLORIDE 0.9 % IV SOLN: 250.0000 mL | INTRAVENOUS | Status: DC | PRN

## 2016-01-11 MED ORDER — SODIUM CHLORIDE 0.9% FLUSH
3.0000 mL | Freq: Two times a day (BID) | INTRAVENOUS | Status: DC
  Administered 2016-01-12 (×2): 3 mL via INTRAVENOUS
  Administered 2016-01-13: 10 mL via INTRAVENOUS
  Administered 2016-01-13 – 2016-01-15 (×3): 3 mL via INTRAVENOUS

## 2016-01-11 MED ORDER — LIDOCAINE HCL (PF) 1 % IJ SOLN
INTRAMUSCULAR | Status: DC | PRN
  Administered 2016-01-11: 5 mL

## 2016-01-11 MED ORDER — FENTANYL CITRATE (PF) 100 MCG/2ML IJ SOLN
INTRAMUSCULAR | Status: AC
  Filled 2016-01-11: qty 2

## 2016-01-11 MED ORDER — LIDOCAINE HCL (PF) 1 % IJ SOLN
INTRAMUSCULAR | Status: AC
  Filled 2016-01-11: qty 30

## 2016-01-11 MED ORDER — SODIUM CHLORIDE 0.9 % IV SOLN
INTRAVENOUS | Status: DC
  Administered 2016-01-11: 15:00:00 via INTRAVENOUS

## 2016-01-11 MED ORDER — SODIUM CHLORIDE 0.9% FLUSH: 3.0000 mL | INTRAVENOUS | Status: DC | PRN

## 2016-01-11 SURGICAL SUPPLY — 22 items
CATH BALLN WEDGE 5F 110CM (CATHETERS) ×2 IMPLANT
CATH INFINITI 5 FR JL3.5 (CATHETERS) ×2 IMPLANT
CATH INFINITI 5FR AL1 (CATHETERS) ×2 IMPLANT
CATH INFINITI 5FR ANG PIGTAIL (CATHETERS) ×2 IMPLANT
CATH INFINITI JR4 5F (CATHETERS) ×2 IMPLANT
CATH LAUNCHER 5F EBU3.0 (CATHETERS) IMPLANT
CATH LAUNCHER 5F JL3 (CATHETERS) IMPLANT
CATH VISTA GUIDE 6FR XBLAD3.5 (CATHETERS) ×2 IMPLANT
CATHETER LAUNCHER 5F EBU3.0 (CATHETERS) ×3
CATHETER LAUNCHER 5F JL3 (CATHETERS) ×3
DEVICE RAD COMP TR BAND LRG (VASCULAR PRODUCTS) ×2 IMPLANT
GLIDESHEATH SLEND SS 6F .021 (SHEATH) ×2 IMPLANT
KIT HEART LEFT (KITS) ×3 IMPLANT
PACK CARDIAC CATHETERIZATION (CUSTOM PROCEDURE TRAY) ×3 IMPLANT
SHEATH FAST CATH BRACH 5F 5CM (SHEATH) ×2 IMPLANT
TRANSDUCER W/STOPCOCK (MISCELLANEOUS) ×4 IMPLANT
TUBING ART PRESS 72  MALE/MALE (TUBING) ×2 IMPLANT
TUBING CIL FLEX 10 FLL-RA (TUBING) ×3 IMPLANT
WIRE ASAHI PROWATER 180CM (WIRE) ×2 IMPLANT
WIRE EMERALD ST .035X150CM (WIRE) ×2 IMPLANT
WIRE HI TORQ VERSACORE-J 145CM (WIRE) ×2 IMPLANT
WIRE SAFE-T 1.5MM-J .035X260CM (WIRE) ×2 IMPLANT

## 2016-01-11 NOTE — Progress Notes (Signed)
Removed Coban from pt's rt arm, noticed some swelling in right arm. Called Cath lab to come assess. Cath Lab RN at bedside. Will continue to monitor.

## 2016-01-11 NOTE — Progress Notes (Signed)
TCTS BRIEF PROGRESS NOTE   Patient seen and examined, chart, ECHO's and cath reviewed.  Full consult note to follow.  I agree w/ Dr Katrinka Blazing that cardiac MRI + MRA is indicated to r/o additional congenital heart defects, most notably anomalous pulmonary veins and/or sinus venosus ASD.  Repeat right heart cath with saturation run to r/o shunt physiology might be necessary.  Patient will need dental service evaluation as well.  Purcell Nails, MD 01/11/2016 7:15 PM

## 2016-01-11 NOTE — Progress Notes (Signed)
Subjective: No acute events overnight. No reports of palpitations, chest pain or abdominal pain.  Objective: Vital signs in last 24 hours: Filed Vitals:   01/10/16 0800 01/10/16 1509 01/10/16 2242 01/11/16 0500  BP:  154/55 135/44 140/53  Pulse:  70 56 54  Temp:  97.8 F (36.6 C) 98.3 F (36.8 C) 97.5 F (36.4 C)  TempSrc:  Oral Oral Oral  Resp: 18  18   Height:      Weight:    51.12 kg (112 lb 11.2 oz)  SpO2: 98% 100% 100% 100%   Weight change: 0.907 kg (2 lb)  Intake/Output Summary (Last 24 hours) at 01/11/16 1047 Last data filed at 01/11/16 0750  Gross per 24 hour  Intake    360 ml  Output    250 ml  Net    110 ml   BP 140/53 mmHg  Pulse 54  Temp(Src) 97.5 F (36.4 C) (Oral)  Resp 18  Ht 5\' 3"  (1.6 m)  Wt 51.12 kg (112 lb 11.2 oz)  BMI 19.97 kg/m2  SpO2 100%  LMP 12/12/2015  General appearance: alert, cooperative and no distress Lungs: clear to auscultation bilaterally Heart: regularly irregular rhythm, systolic murmur: holosystolic 3/6, at 2nd right intercostal space radiates to carotids and diastolic murmur: 9/5,GL 2nd left intercostal space, radiates to aortic area and apex Abdomen: soft, non-tender; bowel sounds normal; no masses, mild hepatomegaly Extremities: extremities normal, atraumatic, no cyanosis, no edema  Lab Results:  Basic Metabolic Panel:  Last Labs      Recent Labs Lab 01/10/16 0809 01/11/16 0424  NA 135 132*  K 3.4* 3.5  CL 97* 99*  CO2 25 23  GLUCOSE 111* 99  BUN 33* 29*  CREATININE 1.39* 1.07*  CALCIUM 8.6* 8.3*     Liver Function Tests:  Last Labs      Recent Labs Lab 01/08/16 1002 01/09/16 0028  AST 49* 62*  ALT 39 49  ALKPHOS 82 71  BILITOT 1.6* 1.4*  PROT 6.0* 5.8*  ALBUMIN 2.8* 2.7*     CBC:  Last Labs      Recent Labs Lab 01/04/16 1349 01/08/16 0750  01/10/16 0541 01/11/16 0424  WBC 8.0 11.0* < > 8.1 8.8  NEUTROABS 5200 8.4* --  --   --   HGB 12.6 14.7 < > 11.9* 12.3  HCT 38.6 44.0 < > 35.5* 36.7  MCV 91.9 93.6 < > 91.7 92.2  PLT 318 309 < > 212 215  < > = values in this interval not displayed.       Micro Results: Recent Results (from the past 240 hour(s))  C difficile quick scan w PCR reflex     Status: Abnormal   Collection Time: 01/08/16  5:08 PM  Result Value Ref Range Status   C Diff antigen POSITIVE (A) NEGATIVE Final   C Diff toxin NEGATIVE NEGATIVE Final   C Diff interpretation   Final    C. difficile present, but toxin not detected. This indicates colonization. In most cases, this does not require treatment. If patient has signs and symptoms consistent with colitis, consider treatment. Requires ENTERIC precautions.  Clostridium Difficile by PCR     Status: None   Collection Time: 01/08/16  5:08 PM  Result Value Ref Range Status   Toxigenic C Difficile by pcr NEGATIVE NEGATIVE Final  MRSA PCR Screening     Status: None   Collection Time: 01/08/16  9:48 PM  Result Value Ref Range Status   MRSA by  PCR NEGATIVE NEGATIVE Final    Comment:        The GeneXpert MRSA Assay (FDA approved for NASAL specimens only), is one component of a comprehensive MRSA colonization surveillance program. It is not intended to diagnose MRSA infection nor to guide or monitor treatment for MRSA infections.    Studies/Results: No results found. Medications: I have reviewed the patient's current medications. Scheduled Meds: . pneumococcal 23 valent vaccine  0.5 mL Intramuscular Tomorrow-1000  . sodium chloride flush  3 mL Intravenous Q12H   Continuous Infusions: . sodium chloride    . amiodarone 30 mg/hr (01/11/16 0123)  . heparin 700 Units/hr (01/11/16 0828)   PRN Meds:.sodium chloride, acetaminophen, ondansetron (ZOFRAN) IV, sodium chloride flush  Assessment/Plan: Sheila Branch is a 45 yo female with a afib with a history of congenital heart disease, being seen for an episode of  afib and acute congestive heart failure exacerbation.   1. Afib with RVR adn acute HFrEF exacerbation: Patient is in sinus rhythm and reports no episodes of SOB or palpitations -cardiology is following -scheduled left and right heart catheritization for ischemic evaluation today -rate and rhythm currently controlled on IV amiordarone. Will switch to oral amiodarone today -anticoagulated on heparin  2. C. Diff colitis: Lab results showed antigen positive, toxin negative stool. No recurrent diarrhea or abdominal pain -symptoms have resolved, PO vanc was discontinued. Will continue to monitor clinically   This is a Psychologist, occupational Note.  The care of the patient was discussed with Dr. Kyung Rudd and the assessment and plan formulated with their assistance.  Please see their attached note for official documentation of the daily encounter.   LOS: 3 days   Sheila Branch, Med Student 01/11/2016, 10:47 AM

## 2016-01-11 NOTE — H&P (View-Only) (Signed)
       Patient Name: Sheila Branch Date of Encounter: 01/11/2016    SUBJECTIVE: She feels well this morning. She developed a little shortness of breath while lying down earlier this morning. She feels she was getting anxious about the upcoming heart catheterization.  TELEMETRY:  Normal sinus rhythm with PACs. Filed Vitals:   01/10/16 0800 01/10/16 1509 01/10/16 2242 01/11/16 0500  BP:  154/55 135/44 140/53  Pulse:  70 56 54  Temp:  97.8 F (36.6 C) 98.3 F (36.8 C) 97.5 F (36.4 C)  TempSrc:  Oral Oral Oral  Resp: 18  18   Height:      Weight:    112 lb 11.2 oz (51.12 kg)  SpO2: 98% 100% 100% 100%    Intake/Output Summary (Last 24 hours) at 01/11/16 1151 Last data filed at 01/11/16 1112  Gross per 24 hour  Intake 1421.92 ml  Output    250 ml  Net 1171.92 ml   LABS: Basic Metabolic Panel:  Recent Labs  01/10/16 0809 01/11/16 0424  NA 135 132*  K 3.4* 3.5  CL 97* 99*  CO2 25 23  GLUCOSE 111* 99  BUN 33* 29*  CREATININE 1.39* 1.07*  CALCIUM 8.6* 8.3*   CBC:  Recent Labs  01/10/16 0541 01/11/16 0424  WBC 8.1 8.8  HGB 11.9* 12.3  HCT 35.5* 36.7  MCV 91.7 92.2  PLT 212 215     Radiology/Studies:  No new data  Physical Exam: Blood pressure 140/53, pulse 54, temperature 97.5 F (36.4 C), temperature source Oral, resp. rate 18, height 5' 3" (1.6 m), weight 112 lb 11.2 oz (51.12 kg), last menstrual period 12/12/2015, SpO2 100 %. Weight change: 2 lb (0.907 kg)  Wt Readings from Last 3 Encounters:  01/11/16 112 lb 11.2 oz (51.12 kg)  01/06/16 104 lb (47.174 kg)  01/06/16 100 lb (45.36 kg)   Marked elevation in jugular veins noted with patient sitting. Decreased breath sounds at the bases bilaterally. 3 to 4/6 crescendo decrescendo systolic murmur and a grade 3/6 decrescendo murmur of aortic regurgitation. Pitting edema bilateral lower extremities. Neuro exam is intact  ASSESSMENT:  1. Significant aortic valve disease with severe aortic  regurgitation and moderate aortic stenosis. 2. Acute on chronic systolic and diastolic heart failure 3. C. difficile enterocolitis has significantly improved 4. Atrial fibrillation has not recurred  Plan:  1. Plan left and right heart cath with coronary angio later today. 2. Will need diuresis immediately post cath to avoid decompensated CHF. Ordered Lasix 20 mg IV 3. Convert amiodarone to oral dosing  Signed, Janaisha Tolsma III,Zanita Millman W 01/11/2016, 11:51 AM 

## 2016-01-11 NOTE — Interval H&P Note (Signed)
History and Physical Interval Note:  01/11/2016 1:13 PM  Sheila Branch  has presented today for cardiac cath with the diagnosis of aortic stenosis, aortic insufficiency, acute CHF.  The various methods of treatment have been discussed with the patient and family. After consideration of risks, benefits and other options for treatment, the patient has consented to  Procedure(s): Right/Left Heart Cath and Coronary Angiography (N/A) as a surgical intervention .  The patient's history has been reviewed, patient examined, no change in status, stable for surgery.  I have reviewed the patient's chart and labs.  Questions were answered to the patient's satisfaction.     [name]

## 2016-01-11 NOTE — Progress Notes (Signed)
Subjective: Sheila Branch had no acute events overnight. She feels well this morning without any recurrent chest pain, palpitation, shortness of breath, abdominal pain, N/V/D or other issues at this time.  Objective: Vital signs in last 24 hours: Filed Vitals:   01/10/16 0800 01/10/16 1509 01/10/16 2242 01/11/16 0500  BP:  154/55 135/44 140/53  Pulse:  70 56 54  Temp:  97.8 F (36.6 C) 98.3 F (36.8 C) 97.5 F (36.4 C)  TempSrc:  Oral Oral Oral  Resp: 18  18   Height:      Weight:    112 lb 11.2 oz (51.12 kg)  SpO2: 98% 100% 100% 100%   Weight change: 2 lb (0.907 kg)  Intake/Output Summary (Last 24 hours) at 01/11/16 1022 Last data filed at 01/11/16 0750  Gross per 24 hour  Intake    360 ml  Output    250 ml  Net    110 ml     Gen: Well-appearing, alert and oriented to person, place, and time HEENT: Oropharynx clear without erythema or exudate.  Neck: No cervical LAD, no thyromegaly or nodules, no JVD noted. CV: Normal rate, regular rhythm, III/VI systolic murmur heard best at Rt 2nd ICS that radiates to the neck and III/VI blowing diastolic murmur heard best at LSB but auscultated throughout the precordium. Pulmonary: Normal effort, coarse breath sounds heard at R lung base. L lung CTA. Abdominal: Soft, mild tenderness in RUQ with hepatomegaly 3cm below the costal margin. No splenomegaly noted. No rebound or guardinc. NABS. Extremities: Distal pulses 2+ in upper and lower extremities bilaterally, no tenderness, erythema. Pitting edema to the knees bilaterally. Neuro: CN II-XII grossly intact, no focal weakness or sensory deficits noted  Lab Results: Basic Metabolic Panel:  Recent Labs Lab 01/10/16 0809 01/11/16 0424  NA 135 132*  K 3.4* 3.5  CL 97* 99*  CO2 25 23  GLUCOSE 111* 99  BUN 33* 29*  CREATININE 1.39* 1.07*  CALCIUM 8.6* 8.3*   Liver Function Tests:  Recent Labs Lab 01/08/16 1002 01/09/16 0028  AST 49* 62*  ALT 39 49  ALKPHOS 82 71  BILITOT  1.6* 1.4*  PROT 6.0* 5.8*  ALBUMIN 2.8* 2.7*   CBC:  Recent Labs Lab 01/04/16 1349 01/08/16 0750  01/10/16 0541 01/11/16 0424  WBC 8.0 11.0*  < > 8.1 8.8  NEUTROABS 5200 8.4*  --   --   --   HGB 12.6 14.7  < > 11.9* 12.3  HCT 38.6 44.0  < > 35.5* 36.7  MCV 91.9 93.6  < > 91.7 92.2  PLT 318 309  < > 212 215  < > = values in this interval not displayed. Micro Results: Recent Results (from the past 240 hour(s))  C difficile quick scan w PCR reflex     Status: Abnormal   Collection Time: 01/08/16  5:08 PM  Result Value Ref Range Status   C Diff antigen POSITIVE (A) NEGATIVE Final   C Diff toxin NEGATIVE NEGATIVE Final   C Diff interpretation   Final    C. difficile present, but toxin not detected. This indicates colonization. In most cases, this does not require treatment. If patient has signs and symptoms consistent with colitis, consider treatment. Requires ENTERIC precautions.  Clostridium Difficile by PCR     Status: None   Collection Time: 01/08/16  5:08 PM  Result Value Ref Range Status   Toxigenic C Difficile by pcr NEGATIVE NEGATIVE Final  MRSA PCR Screening  Status: None   Collection Time: 01/08/16  9:48 PM  Result Value Ref Range Status   MRSA by PCR NEGATIVE NEGATIVE Final    Comment:        The GeneXpert MRSA Assay (FDA approved for NASAL specimens only), is one component of a comprehensive MRSA colonization surveillance program. It is not intended to diagnose MRSA infection nor to guide or monitor treatment for MRSA infections.    Assessment/Plan: Afib with RVR and Acute HFrEF exacerbation: Patient with h/o congenital valvular heart disease, severe AS and AI, and reduced EF, presenting with RVR, orthopnea, nonproductive cough, LE edema, and elevated BNP. Acute HFrEF exacerbation likely 2/2 acute afib exacerbation, which is likely 2/2 recent diarrheal illness. She denies excessive caffeine, but has been drinking lots of fluids and not necessarily adhering  to low salt diet. As noted in cardiology note, patient unlikely to remain in NSR without antiarrhythmic due to her severe LA dilation.Patient converted to NSR on amio IV. CHADs-VASc of 2 (gender, CHF), so anticoagulation is warranted. - Cards following, appreciate rec's - Amiodarone IV, per cards. They will transition her to PO soon. Will touch base - Heparin gtt - Lasix 20 mg IV x 1 - holding currently 2/2 AKI. Will consider additional diuresis as clinically indicated. Cr improving today. F/u BMPs. - Telemetry - Strict I/Os  C. Diff colitis: Patient complains of multiple episodes daily of watery diarrhea with incontinence. Given recent admission for CAP and treatment with fluoroquinolone, C diff or other infectious etiology seems most likely. C. Dif antigen positive, toxin negative. Symptoms currently improving. Initially on PO vanc but dc'ed with resolution of symptoms. -CTM  Dispo: Disposition is deferred at this time, awaiting improvement of current medical problems.  Anticipated discharge in approximately 1-3 day(s).   The patient does have a current PCP (Dawn Marland Mcalpine, MD) and does need an [facility] hospital follow-up appointment after discharge.  The patient does not have transportation limitations that hinder transportation to clinic appointments.   LOS: 3 days   Darrick Huntsman, MD 01/11/2016, 10:22 AM

## 2016-01-11 NOTE — Progress Notes (Addendum)
ANTICOAGULATION CONSULT NOTE - FOLLOW UP  Pharmacy Consult:  Heparin Indication: atrial fibrillation  Allergies  Allergen Reactions  . Penicillins Cross Reactors Swelling and Other (See Comments)    Mouth Ulcers Has patient had a PCN reaction causing immediate rash, facial/tongue/throat swelling, SOB or lightheadedness with hypotension: Yes Has patient had a PCN reaction causing severe rash involving mucus membranes or skin necrosis: Yes Has patient had a PCN reaction that required hospitalization No Has patient had a PCN reaction occurring within the last 10 years: No If all of the above answers are "NO", then may proceed with Cephalosporin use.    Patient Measurements: Height: 5\' 3"  (160 cm) Weight: 112 lb 11.2 oz (51.12 kg) IBW/kg (Calculated) : 52.4  Heparin dosing weight = 47 kg  Vital Signs: Temp: 97.5 F (36.4 C) (05/03 0500) Temp Source: Oral (05/03 0500) BP: 140/53 mmHg (05/03 0500) Pulse Rate: 54 (05/03 0500)  Labs:  Recent Labs  01/08/16 1002  01/09/16 0028 01/09/16 0759  01/09/16 1953 01/10/16 0541 01/10/16 0809 01/11/16 0424  HGB  --   < > 11.9*  --   --   --  11.9*  --  12.3  HCT  --   --  34.7*  --   --   --  35.5*  --  36.7  PLT  --   --  184  --   --   --  212  --  215  APTT  --   --  57*  --   < > 78* 77*  --  64*  HEPARINUNFRC  --   < > 2.00* >2.20*  --   --  1.22*  --  0.48  CREATININE 0.89  --  1.37*  --   --   --   --  1.39* 1.07*  TROPONINI 0.03  --   --   --   --   --   --   --   --   < > = values in this interval not displayed. Estimated Creatinine Clearance: 54.7 mL/min (by C-G formula based on Cr of 1.07).   Assessment: 83 YOF with history of Afib on Xarelto PTA for hx Afib, last dose 4/29. Heparin while cardiac w/u ongoing.  Last HL trending down to 0.48, aPTT is slightly low at 64. Levels are not quite correlating. Cath 5/3 at 1000.  Goal of Therapy:  Heparin level 0.3-0.7 units/ml  APTT 66 - 102 sec Monitor platelets by  anticoagulation protocol: Yes    Plan:  Increase heparin gtt to 700 units/hr Monitor daily aPTT / HL, CBC, s/s of bleed F/U s/p cath  Enzo Bi, PharmD, BCPS Clinical Pharmacist Pager [phone] 01/11/2016 7:55 AM   ADDENDUM:  No signs of CAD but will continue work up per Cards. Continue heparin gtt 8 hrs post sheath removal. Sheath was removed at 1407.   Plan: Restart heparin gtt at 700 units/hr at 2200 tonight Check aPTT / HL in 8 hrs Monitor daily aPTT / HL, CBC, s/s of bleed  Enzo Bi, PharmD, [facility] Clinical Pharmacist Pager [phone] 01/11/2016 2:37 PM

## 2016-01-11 NOTE — Progress Notes (Signed)
       Patient Name: Sheila Branch Date of Encounter: 01/11/2016    SUBJECTIVE: She feels well this morning. She developed a little shortness of breath while lying down earlier this morning. She feels she was getting anxious about the upcoming heart catheterization.  TELEMETRY:  Normal sinus rhythm with PACs. Filed Vitals:   01/10/16 0800 01/10/16 1509 01/10/16 2242 01/11/16 0500  BP:  154/55 135/44 140/53  Pulse:  70 56 54  Temp:  97.8 F (36.6 C) 98.3 F (36.8 C) 97.5 F (36.4 C)  TempSrc:  Oral Oral Oral  Resp: 18  18   Height:      Weight:    112 lb 11.2 oz (51.12 kg)  SpO2: 98% 100% 100% 100%    Intake/Output Summary (Last 24 hours) at 01/11/16 1151 Last data filed at 01/11/16 1112  Gross per 24 hour  Intake 1421.92 ml  Output    250 ml  Net 1171.92 ml   LABS: Basic Metabolic Panel:  Recent Labs  77/93/90 0809 01/11/16 0424  NA 135 132*  K 3.4* 3.5  CL 97* 99*  CO2 25 23  GLUCOSE 111* 99  BUN 33* 29*  CREATININE 1.39* 1.07*  CALCIUM 8.6* 8.3*   CBC:  Recent Labs  01/10/16 0541 01/11/16 0424  WBC 8.1 8.8  HGB 11.9* 12.3  HCT 35.5* 36.7  MCV 91.7 92.2  PLT 212 215     Radiology/Studies:  No new data  Physical Exam: Blood pressure 140/53, pulse 54, temperature 97.5 F (36.4 C), temperature source Oral, resp. rate 18, height 5\' 3"  (1.6 m), weight 112 lb 11.2 oz (51.12 kg), last menstrual period 12/12/2015, SpO2 100 %. Weight change: 2 lb (0.907 kg)  Wt Readings from Last 3 Encounters:  01/11/16 112 lb 11.2 oz (51.12 kg)  01/06/16 104 lb (47.174 kg)  01/06/16 100 lb (45.36 kg)   Marked elevation in jugular veins noted with patient sitting. Decreased breath sounds at the bases bilaterally. 3 to 4/6 crescendo decrescendo systolic murmur and a grade 3/6 decrescendo murmur of aortic regurgitation. Pitting edema bilateral lower extremities. Neuro exam is intact  ASSESSMENT:  1. Significant aortic valve disease with severe aortic  regurgitation and moderate aortic stenosis. 2. Acute on chronic systolic and diastolic heart failure 3. C. difficile enterocolitis has significantly improved 4. Atrial fibrillation has not recurred  Plan:  1. Plan left and right heart cath with coronary angio later today. 2. Will need diuresis immediately post cath to avoid decompensated CHF. Ordered Lasix 20 mg IV 3. Convert amiodarone to oral dosing  Signed, Lesleigh Noe 01/11/2016, 11:51 AM

## 2016-01-11 NOTE — Consult Note (Addendum)
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  The patient states that she has known that she has had a heart murmur all of her life and has been told that she had a "hole in her heart" She apparently underwent cardiac catheterization during her childhood and her mother was told that she needed cardiac surgery. According to the patient her mother insisted on postponing surgical intervention and treating her medically as long as possible.  The patient moved to [place] within 20 years ago.  She apparently underwent echocardiography and was evaluated by cardiologist when she was pregnant with her second child approximately 18 years ago.  She carried her child to term pregnancy and had an uncomplicated delivery. She never returned for medical follow-up.  The patient states that approximately 9 months ago she began to experience progressive symptoms of exertional shortness of breath Symptoms gradually got worse and became problematic with relatively mild physical activity and occasionally at rest.  She developed symptoms of orthopnea, PND and lower extremity edema.  He quit smoking approximately 4 months ago but shortness of breath continued to get worse.  She was hospitalized acutely from 11/03/2015 through 11/08/2015 for community acquired pneumonia and rapid atrial fibrillation and atrial flutter.  Transthoracic echocardiogram performed at that time revealed severe aortic insufficiency with subvalvar aortic stenosis. There was mild left ventricular systolic dysfunction with ejection fraction estimated 50-55%. There was trivial mitral regurgitation but severe left atrial enlargement. There was moderate right atrial enlargement and findings consistent with moderate pulmonary hypertension.  The patient underwent TEE and was successfully cardioverted back to sinus rhythm.  Transesophageal echocardiogram revealed diffuse "left ventricular systolic dysfunction with ejection fraction estimated 40% There was severe aortic insufficiency and subvalvar aortic membrane. She was discharged from the hospital and seen in follow-up by Sheila Branch on 12/22/2015.   The patient continued to experience symptoms of exertional shortness of breath, chest pain and a productive cough. She subsequently developed GI illness with diarrhea.  She was readmitted to the hospital 01/06/2016 with recurrent persistent atrial fibrillation associated with rapid ventricular response.  She spontaneously converted back to sinus rhythm on intravenous amiodarone. Diagnostic cardiac catheterization revealed normal coronary artery anatomy with no significant coronary artery disease.  She had severe pulmonary hypertension with PA pressures measured 90/44 andpulmonary capillary wedge pressure reported 17 mmHg.  Fick cardiac output was measured 7.02 L/m corresponding to cardiac index of 4.68.   Formal oxygen saturation run was not performed to assess for possible shunt physiology.  Cardiothoracic surgical consultation was requested.  The patient is single and lives locally in [place] with her brother.  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 Her mother passed away several years ago. She has to a adult children, the youngest of which is in college locally in [place].  He describes a long-standing history of exertional shortness of  breath has progressed over the past year. In recent months she gets short of breath with low level physical activity and occasionally at rest She has occasional episodes of PND and orthopnea. She has had episodes of chest pain or chest tightness that have been exacerbated by tachycardia.  She has not had dizzy spells or syncope. She has mild lower extremity edema.  Since hospital admission her breathing has improved.  She states that her diarrhea has improved and her appetite is slowly returning to normal.   Past Medical History  Diagnosis Date  . Aortic insufficiency     severe  . Subvalvar aortic stenosis   . Chronic combined systolic and diastolic congestive heart failure (HCC)   . Atrial fibrillation with rapid ventricular response (HCC) 01/08/2016  . Persistent atrial fibrillation (HCC) 11/03/2015  . CHD (congenital heart disease)     Past Surgical History  Procedure Laterality Date  . Cardiac catheterization  childhood  . Cesarean section    . Rectal surgery    . Tubal ligation    . Cardioversion N/A 11/09/2015    Procedure: CARDIOVERSION;  Surgeon: Lars Masson, MD;  Location: [facility] ENDOSCOPY;  Service: Cardiovascular;  Laterality: N/A;  . Tee without cardioversion N/A 11/09/2015    Procedure: TRANSESOPHAGEAL ECHOCARDIOGRAM (TEE);  Surgeon: Lars Masson, MD;  Location: [facility] ENDOSCOPY;  Service: Cardiovascular;  Laterality: N/A;  . Cardiac catheterization N/A 01/11/2016    Procedure: Right/Left Heart Cath and Coronary Angiography;  Surgeon: Kathleene Hazel, MD;  Location: [facility] INVASIVE CV LAB;  Service: Cardiovascular;  Laterality: N/A;    Family History  Problem Relation Age of Onset  . Heart disease Mother     "i think she died of heart issue"    Social History   Social History  . Marital Status: Single    Spouse Name: N/A  . Number of Children: N/A  . Years of Education: N/A   Occupational History  . Not on file.   Social History Main Topics  . Smoking status:  Former Smoker -- 1.00 packs/day for 23 years    Types: Cigarettes    Quit date: 09/12/2015  . Smokeless tobacco: Not on file  . Alcohol Use: No     Comment: occasionally  . Drug Use: Yes    Special: Marijuana     Comment: occasionally  . Sexual Activity: Not on file   Other Topics Concern  . Not on file   Social History Narrative    Prior to Admission medications   Medication Sig Start Date End Date Taking? Authorizing Provider  furosemide (LASIX) 20 MG tablet Take 20 mg by mouth daily as needed. FOR SWELLING ONLY   Yes Historical Provider, MD  metoprolol succinate (TOPROL XL) 100 MG 24 hr tablet Take 1 tablet (100 mg total) by mouth daily. Take with or immediately following a meal. 12/12/15  Yes Dyann Kief, PA-C  rivaroxaban (XARELTO) 20 MG TABS tablet Take 1 tablet (20 mg total) by mouth daily with supper. Patient taking differently: Take 20 mg by mouth every evening.  01/02/16  Yes Leone Brand, NP  triamcinolone (KENALOG) 0.1 % paste Use as directed 1 application in the mouth or throat 2 (two) times daily. Patient not taking: Reported on 01/06/2016 11/16/15   Pete Glatter, MD    Current Facility-Administered Medications  Medication Dose Route Frequency Provider Last Rate Last Dose  . 0.9 %  sodium chloride infusion  250 mL Intravenous PRN Kathleene Hazel, MD      .  acetaminophen (TYLENOL) tablet 650 mg  650 mg Oral Q4H PRN Lora Paula, MD      . amiodarone (PACERONE) tablet 200 mg  200 mg Oral BID Lyn Records, MD   200 mg at 01/11/16 1206  . heparin ADULT infusion 100 units/mL (25000 units/250 mL)  700 Units/hr Intravenous Continuous Para March Batchelder, RPH      . ondansetron [facility]) injection 4 mg  4 mg Intravenous Q6H PRN Lora Paula, MD   4 mg at 01/10/16 1722  . pneumococcal 23 valent vaccine (PNU-IMMUNE) injection 0.5 mL  0.5 mL Intramuscular Tomorrow-1000 Levert Feinstein, MD      . sodium chloride flush (NS) 0.9 % injection 3 mL  3 mL  Intravenous Q12H Kathleene Hazel, MD   3 mL at 01/11/16 1815  . sodium chloride flush (NS) 0.9 % injection 3 mL  3 mL Intravenous PRN Kathleene Hazel, MD        Allergies  Allergen Reactions  . Penicillins Cross Reactors Swelling and Other (See Comments)    Mouth Ulcers Has patient had a PCN reaction causing immediate rash, facial/tongue/throat swelling, SOB or lightheadedness with hypotension: Yes Has patient had a PCN reaction causing severe rash involving mucus membranes or skin necrosis: Yes Has patient had a PCN reaction that required hospitalization No Has patient had a PCN reaction occurring within the last 10 years: No If all of the above answers are "NO", then may proceed with Cephalosporin use.       Review of Systems:   General:  decreased appetite, decreased energy, no weight gain, no weight loss, no fever  Cardiac:  + chest pain with exertion, + chest pain at rest, +SOB with exertion, + resting SOB, + PND, + orthopnea, + palpitations, + arrhythmia, + atrial fibrillation, + LE edema, no dizzy spells, no syncope  Respiratory:  + shortness of breath, no home oxygen, + productive cough, no dry cough, + bronchitis, no wheezing, no hemoptysis, no asthma, no pain with inspiration or cough, no sleep apnea, no CPAP at night  GI:   no difficulty swallowing, no reflux, no frequent heartburn, no hiatal hernia, no abdominal pain, no constipation, no diarrhea, no hematochezia, no hematemesis, no melena  GU:   no dysuria,  no frequency, no urinary tract infection, no hematuria, no kidney stones, no kidney disease  Vascular:  no pain suggestive of claudication, no pain in feet, no leg cramps, no varicose veins, no DVT, no non-healing foot ulcer  Neuro:   no stroke, no TIA's, no seizures, no headaches, no temporary blindness one eye,  no slurred speech, no peripheral neuropathy, no chronic pain, no instability of gait, no memory/cognitive dysfunction  Musculoskeletal: no  arthritis, no joint swelling, no myalgias, no difficulty walking, normal mobility   Skin:   no rash, no itching, no skin infections, no pressure sores or ulcerations  Psych:   no anxiety, no depression, + nervousness, no unusual recent stress  Eyes:   + blurry vision, no floaters, no recent vision changes, does not wear glasses or contacts  ENT:   no hearing loss, no loose or painful teeth, no dentures, last saw dentist many years ago  Hematologic:  no easy bruising, no abnormal bleeding, no clotting disorder, no frequent epistaxis  Endocrine:  no diabetes, does not check CBG's at home     Physical Exam:   BP 135/62 mmHg  Pulse 64  Temp(Src) 97.5 F (36.4 C) (Oral)  Resp 20  Ht  5\' 3"  (1.6 m)  Wt 112 lb 11.2 oz (51.12 kg)  BMI 19.97 kg/m2  SpO2 94%  LMP 12/12/2015  General:  Thin,  well-appearing  HEENT:  Unremarkable   Neck:   no JVD, no bruits, no adenopathy   Chest:   clear to auscultation, symmetrical breath sounds, no wheezes, no rhonchi   CV:   RRR, grade IV/VI systolic and diastolic murmurs   Abdomen:  soft, non-tender, no masses   Extremities:  warm, well-perfused, pulses palpable, trace lower extremity edema  Rectal/GU  Deferred  Neuro:   Grossly non-focal and symmetrical throughout  Skin:   Clean and dry, no rashes, no breakdown  Diagnostic Tests:  Transthoracic Echocardiography  (Report amended )  Patient: Sheila Branch, Sheila Branch MR #: [id number] Study Date: 11/05/2015 Gender: F Age: 11 Height: 160 cm Weight: 44.2 kg BSA: 1.39 m^2 Pt. Status: Room: 3W06C  SONOGRAPHER Nolon Rod, RDCS PERFORMING [facility], Inpatient ATTENDING Sargent, Courteney Lyn ADMITTING Erlinda Hong Irven Coe, Marquita Palms  cc:  ------------------------------------------------------------------- LV EF: 50% - 55%  ------------------------------------------------------------------- Indications:  Dyspnea 786.09.  ------------------------------------------------------------------- History: PMH: Aortic valve disease. Mitral valve disease. Risk factors: Former tobacco use.  ------------------------------------------------------------------- Study Conclusions  - Left ventricle: The cavity size was mildly dilated. There was  mild concentric hypertrophy. Systolic function was mildly  reduced. The estimated ejection fraction was in the range of 50%  to 55%. Diffuse hypokinesis. - Aortic valve: There appears to be sub-valvular calcification in  the LVOT that could be causing sub-valvular stenosis.  Trileaflet; mildly thickened, moderately calcified leaflets,  mostly the left coronary cusp. Transvalvular velocity was  increased. There was moderate stenosis. There was moderate to  severe regurgitation. Peak velocity (S): 350 cm/s. Mean gradient  (S): 26 mm Hg. Valve area (VTI): 1.14 cm^2. Valve area (Vmax):  1.02 cm^2. Valve area (Vmean): 1.09 cm^2. Regurgitation pressure  half-time: 392 ms. - Mitral valve: Calcified annulus. Transvalvular velocity was  within the normal range. There was no evidence for stenosis.  There was trivial regurgitation. Valve area by continuity  equation (using LVOT flow): 2.24 cm^2. - Left atrium: The atrium was severely dilated. - Right ventricle: The cavity size was normal. Wall thickness was  normal. Systolic function was normal. - Right atrium: The atrium was moderately dilated. Central venous  pressure (est): 15 mm Hg. - Atrial septum: A patent foramen ovale cannot be excluded. - Tricuspid valve: There was mild-moderate regurgitation. - Pulmonic valve: There was mild regurgitation. - Pulmonary arteries: Systolic pressure was moderately increased.  PA peak pressure: 50 mm Hg (S). - Inferior vena cava: The vessel was dilated. The respirophasic  diameter changes were blunted (< 50%), consistent with elevated  central venous  pressure.  Impressions:  - No ASD, PFO or VSD closure devide was noted. There is a  calcified, sub-aortic lesion in the LVOT that is likely causing  sub-valvular stenosis. There is clearly a gradient consistent  with moderate stenosis, though it cannot be determined from this  echo how much of the gradient is from the aortic valve vs. the  subvalvular calcification. There is an echodensity in the main  pulmonary artery that is likely acoustic shadow from the cacified  aortic valve and subvalvular apparatus. However, thrombus cannot  be excluded. If there is suspicion for pulmonary embolism,  consider further evaluation.  Transthoracic echocardiography. M-mode, complete 2D, spectral Doppler, and color Doppler. Birthdate: Patient birthdate: March 23, 1971. Age: Patient is 45 yr old. Sex: Gender: female. BMI: 17.3 kg/m^2. Blood pressure:  110/74 Patient status: Inpatient. Study date: Study date: 11/05/2015. Study time: 10:26 AM. Location: Bedside.  -------------------------------------------------------------------  ------------------------------------------------------------------- Left ventricle: The cavity size was mildly dilated. There was mild concentric hypertrophy. Systolic function was mildly reduced. The estimated ejection fraction was in the range of 50% to 55%. Diffuse hypokinesis. The study was not technically sufficient to allow evaluation of LV diastolic dysfunction due to atrial fibrillation.  ------------------------------------------------------------------- Aortic valve: There appears to be sub-valvular calcification in the LVOT that could be causing sub-valvular stenosis. Trileaflet; mildly thickened, moderately calcified leaflets, mostly the left coronary cusp. Mobility was not restricted. Doppler: Transvalvular velocity was increased. There was moderate stenosis. There was moderate to severe regurgitation. VTI ratio of LVOT  to aortic valve: 0.28. Valve area (VTI): 1.14 cm^2. Indexed valve area (VTI): 0.82 cm^2/m^2. Peak velocity ratio of LVOT to aortic valve: 0.25. Valve area (Vmax): 1.02 cm^2. Indexed valve area (Vmax): 0.73 cm^2/m^2. Mean velocity ratio of LVOT to aortic valve: 0.26. Valve area (Vmean): 1.09 cm^2. Indexed valve area (Vmean): 0.78 cm^2/m^2.  Mean gradient (S): 26 mm Hg. Peak gradient (S): 49 mm Hg.  ------------------------------------------------------------------- Aorta: Aortic root: The aortic root was normal in size.  ------------------------------------------------------------------- Mitral valve: Calcified annulus. Mobility was not restricted. Doppler: Transvalvular velocity was within the normal range. There was no evidence for stenosis. There was trivial regurgitation. Valve area by pressure half-time: 2.82 cm^2. Indexed valve area by pressure half-time: 2.03 cm^2/m^2. Valve area by continuity equation (using LVOT flow): 2.24 cm^2. Indexed valve area by continuity equation (using LVOT flow): 1.61 cm^2/m^2. Mean gradient (D): 3 mm Hg. Peak gradient (D): 6 mm Hg.  ------------------------------------------------------------------- Left atrium: The atrium was severely dilated.  ------------------------------------------------------------------- Atrial septum: A patent foramen ovale cannot be excluded.  ------------------------------------------------------------------- Right ventricle: The cavity size was normal. Wall thickness was normal. Systolic function was normal.  ------------------------------------------------------------------- Pulmonic valve: Structurally normal valve. Cusp separation was normal. Doppler: Transvalvular velocity was within the normal range. There was no evidence for stenosis. There was mild regurgitation.  ------------------------------------------------------------------- Tricuspid valve: Structurally normal valve.  Doppler: Transvalvular velocity was within the normal range. There was mild-moderate regurgitation.  ------------------------------------------------------------------- Pulmonary artery: The main pulmonary artery was normal-sized. Systolic pressure was moderately increased.  ------------------------------------------------------------------- Right atrium: The atrium was moderately dilated.  ------------------------------------------------------------------- Pericardium: There was no pericardial effusion.  ------------------------------------------------------------------- Systemic veins: Inferior vena cava: The vessel was dilated. The respirophasic diameter changes were blunted (< 50%), consistent with elevated central venous pressure.  ------------------------------------------------------------------- Measurements  Left ventricle Value Reference LV ID, ED, PLAX chordal 43.8 mm 43 - 52 LV ID, ES, PLAX chordal 35.5 mm 23 - 38 LV fx shortening, PLAX chordal (L) 19 % >=29 LV PW thickness, ED 12.3 mm --------- IVS/LV PW ratio, ED 1 <=1.3 Stroke volume, 2D 82 ml --------- Stroke volume/bsa, 2D 59 ml/m^2 --------- LV ejection fraction, 1-p A4C 52 % --------- LV end-diastolic volume, 2-p 137 ml --------- LV end-systolic volume, 2-p 62 ml --------- LV ejection fraction, 2-p 55 % --------- Stroke volume, 2-p 76 ml --------- LV end-diastolic volume/bsa, 2-p 98 ml/m^2 --------- LV end-systolic volume/bsa, 2-p 44 ml/m^2 --------- Stroke volume/bsa, 2-p 54.2 ml/m^2  ---------  Ventricular septum Value Reference IVS thickness, ED 12.3 mm ---------  LVOT Value Reference LVOT ID, S 23 mm --------- LVOT area 4.15 cm^2 --------- LVOT peak velocity, S 86.3 cm/s --------- LVOT mean velocity, S 61.1 cm/s --------- LVOT VTI, S 19.8 cm ---------  Aortic valve Value Reference Aortic valve peak velocity, S 350 cm/s --------- Aortic valve  mean velocity, S 232 cm/s --------- Aortic valve VTI, S 71.8 cm --------- Aortic mean gradient, S 26 mm Hg --------- Aortic peak gradient, S 49 mm Hg --------- VTI ratio, LVOT/AV 0.28 --------- Aortic valve area, VTI 1.14 cm^2 --------- Aortic valve area/bsa, VTI 0.82 cm^2/m^2 --------- Velocity ratio, peak, LVOT/AV 0.25 --------- Aortic valve area, peak velocity 1.02 cm^2 --------- Aortic valve area/bsa, peak 0.73 cm^2/m^2 --------- velocity Velocity ratio, mean, LVOT/AV 0.26 --------- Aortic valve area, mean velocity 1.09 cm^2 --------- Aortic valve area/bsa, mean 0.78 cm^2/m^2 --------- velocity Aortic regurg pressure half-time 392 ms ---------  Aorta Value Reference Aortic root ID, ED 30 mm ---------  Left atrium Value Reference LA ID, A-P,  ES 52 mm --------- LA ID/bsa, A-P (H) 3.73 cm/m^2 <=2.2 LA volume, S 120 ml --------- LA volume/bsa, S 86.2 ml/m^2 --------- LA volume, ES, 1-p A4C 98.6 ml --------- LA volume/bsa, ES, 1-p A4C 70.8 ml/m^2 --------- LA volume, ES, 1-p A2C 140 ml --------- LA volume/bsa, ES, 1-p A2C 100.6 ml/m^2 ---------  Mitral valve Value Reference Mitral E-wave peak velocity 118 cm/s --------- Mitral mean velocity, D 68.6 cm/s --------- Mitral deceleration time (L) 116 ms 150 - 230 Mitral pressure half-time 76 ms --------- Mitral mean gradient, D 3 mm Hg --------- Mitral peak gradient, D 6 mm Hg --------- Mitral valve area, PHT, DP 2.82 cm^2 --------- Mitral valve area/bsa, PHT, DP 2.03 cm^2/m^2 --------- Mitral valve area, LVOT 2.24 cm^2 --------- continuity Mitral valve area/bsa, LVOT 1.61 cm^2/m^2 --------- continuity Mitral annulus VTI, D 36.7 cm ---------  Pulmonary arteries Value Reference PA pressure, S, DP (H) 50 mm Hg <=30  Tricuspid valve Value Reference Tricuspid regurg peak velocity 296 cm/s --------- Tricuspid peak RV-RA gradient 35 mm Hg ---------  Systemic veins Value Reference Estimated CVP 15 mm Hg ---------  Right ventricle Value  Reference RV pressure, S, DP (H) 50 mm Hg <=30  Legend: (L) and (H) mark values outside specified reference range.  ------------------------------------------------------------------- Hart Carwin, MD [id number]:[id number]:13       Transesophageal Echocardiography  Patient: Sheila Branch, Sheila Branch MR #: [id number] Study Date: 11/09/2015 Gender: F Age: 45 Height: 160 cm Weight: 44.5 kg BSA: 1.4 m^2 Pt. Status: Room: 3W06C  ATTENDING Daiva Eves, Remi Haggard N SONOGRAPHER Delcie Roch, RDCS, CCT ORDERING Tobias Alexander, M.D. PERFORMING Tobias Alexander, M.D. ADMITTING Tyson Alias  cc:  ------------------------------------------------------------------- LV EF: 40% - 45%  ------------------------------------------------------------------- Indications: Atrial fibrillation - 427.31.  ------------------------------------------------------------------- Study Conclusions  - Left ventricle: Systolic function was mildly to moderately  reduced. The estimated ejection fraction was in the range of 40%  to 45%. Diffuse hypokinesis. - Aortic valve: There is thickening and calcification of the left  coronary cusp. There is also a calcified subaortic membrane seen  about 0.5 cm bellow the oartic valve in the LVOT. The transaortic  gradeints were not calculated on the current study.  The leaflets don&'t coapt well and there is severe aortic  regurgitation. - Left atrium: The atrium was severely dilated. No evidence of  thrombus in the atrial cavity or appendage. No evidence of  thrombus in the atrial cavity or appendage. No evidence of  thrombus in the appendage. - Right atrium: No evidence of thrombus in the atrial cavity or  appendage. - Tricuspid valve: There was mild-moderate regurgitation. - Pulmonic valve: No evidence of vegetation. There was  moderate  regurgitation.  Diagnostic transesophageal echocardiography. 2D and color Doppler. Birthdate: Patient birthdate: 02-Nov-1970. Age: Patient is 45 yr old. Sex: Gender: female. BMI: 17.4  kg/m^2. Blood pressure:  113/74 Patient status: Inpatient. Study date: Study date: 11/09/2015. Study time: 09:12 AM. Location: Endoscopy.  -------------------------------------------------------------------  ------------------------------------------------------------------- Left ventricle: Systolic function was mildly to moderately reduced. The estimated ejection fraction was in the range of 40% to 45%. Diffuse hypokinesis.  ------------------------------------------------------------------- Aortic valve: There is thickening and calcification of the left coronary cusp. There is also a calcified subaortic membrane seen about 0.5 cm bellow the oartic valve in the LVOT. The transaortic gradeints were not calculated on the current study. The leaflets don&'t coapt well and there is severe aortic regurgitation. Structurally normal valve. Trileaflet; normal thickness leaflets. Cusp separation was normal. Doppler: There was no significant regurgitation.  ------------------------------------------------------------------- Aorta: The aorta was normal, not dilated, and non-diseased. There was no atheroma. There was no evidence for dissection. Aortic root: The aortic root was not dilated. Ascending aorta: The ascending aorta was normal in size. Aortic arch: The aortic arch was normal in size. Descending aorta: The descending aorta was normal in size.  ------------------------------------------------------------------- Mitral valve: Structurally normal valve. Leaflet separation was normal. Doppler: There was no significant regurgitation.  ------------------------------------------------------------------- Left atrium: The atrium was severely dilated. No  evidence of thrombus in the atrial cavity or appendage. No evidence of thrombus in the atrial cavity or appendage. No evidence of thrombus in the appendage. The appendage was morphologically a left appendage, multilobulated, and of normal size. Emptying velocity was decreased.  ------------------------------------------------------------------- Right ventricle: The cavity size was normal. Wall thickness was normal. Systolic function was normal.  ------------------------------------------------------------------- Pulmonic valve: Structurally normal valve. Cusp separation was normal. No evidence of vegetation. Doppler: There was moderate regurgitation.  ------------------------------------------------------------------- Tricuspid valve: Structurally normal valve. Leaflet separation was normal. Doppler: There was mild-moderate regurgitation.  ------------------------------------------------------------------- Pulmonary artery: The main pulmonary artery was normal-sized.  ------------------------------------------------------------------- Right atrium: The atrium was normal in size. No evidence of thrombus in the atrial cavity or appendage. The appendage was morphologically a right appendage.  ------------------------------------------------------------------- Pericardium: There was no pericardial effusion.  ------------------------------------------------------------------- Post procedure conclusions Ascending Aorta:  - The aorta was normal, not dilated, and non-diseased.  ------------------------------------------------------------------- Prepared and Electronically Authenticated by  Tobias Alexander, M.D. 2017-03-01T11:25:06    CARDIAC CATHETERIZATION Procedures    Right/Left Heart Cath and Coronary Angiography    Conclusion    1. No angiographic evidence of CAD 2. Elevated right sided pressures.   Recommendations: Further workup  in regards to her aortic valve disease per cardiology consult team.     Indications    Aortic valve disease [I35.9 (ICD-10-CM)]    Technique and Indications    Estimated blood loss <50 mL. Indication: Acute CHF, severe aortic valve insufficiency, moderate AS  Procedure: The risks, benefits, complications, treatment options, and expected outcomes were discussed with the patient. The patient and/or family concurred with the proposed plan, giving informed consent. The patient was brought to the cath lab after IV hydration was begun and oral premedication was given. The patient was further sedated with Versed and Fentanyl. There was an IV present in the right antecubital vein. This area was prepped and draped. I then changed this out for a 5 [place] sheath. Right heart catheterization performed with a balloon tipped catheter. The right wrist was assessed with a modified Allens test which was positive. The right wrist was prepped and draped in a sterile fashion. 1% lidocaine was used for local anesthesia. Using the modified Seldinger access technique, a 5 French sheath was placed in the right radial artery. 3 mg Verapamil was given through the  sheath. 3000 units IV heparin was given. Standard diagnostic catheters were used to perform selective coronary angiography. I engaged the RCA with a JR4 catheter and the left main with an EBU 3.0 guiding catheter. I crossed the aortic valve with an AL-1 catheter and straight wire. No LV gram performed. The sheath was removed from the right radial artery and a Terumo hemostasis band was applied at the arteriotomy site on the right wrist.   Sedation: During this procedure the patient is administered a total of Versed 3 mg and Fentanyl 25 mcg to achieve and maintain moderate conscious sedation. The patient's heart rate, blood pressure, and oxygen saturation are monitored continuously during the procedure. The period of conscious sedation is 49 minutes, of which I was  present face-to-face 100% of this time.    Coronary Findings    Dominance: Left   Left Anterior Descending  . Vessel is large. Vessel is angiographically normal.   . First Diagonal Branch   The vessel is large in size.   Marland Kitchen Second Diagonal Branch   The vessel is moderate in size.     Left Circumflex  . Vessel is large. Vessel is angiographically normal.   . First Obtuse Marginal Branch   The vessel is small in size.   . Third Obtuse Marginal Branch   The vessel is large in size.   Marland Kitchen Fourth Obtuse Marginal Branch   The vessel is small in size.     Right Coronary Artery  . Vessel is moderate in size. Vessel is angiographically normal.       Right Heart Pressures Hemodynamic findings consistent with severe pulmonary hypertension. Elevated LV EDP consistent with volume overload.    Coronary Diagrams    Diagnostic Diagram            Implants     No implant documentation for this case.    PACS Images    Show images for Cardiac catheterization     Link to Procedure Log    Procedure Log      Hemo Data       Most Recent Value   Fick Cardiac Output  7.02 L/min   Fick Cardiac Output Index  4.68 (L/min)/BSA   RA A Wave  -99 mmHg   RA V Wave  30 mmHg   RA Mean  25 mmHg   RV Systolic Pressure  94 mmHg   RV Diastolic Pressure  5 mmHg   RV EDP  23 mmHg   PA Systolic Pressure  90 mmHg   PA Diastolic Pressure  44 mmHg   PA Mean  60 mmHg   PW A Wave  -99 mmHg   PW V Wave  20 mmHg   PW Mean  17 mmHg   AO Systolic Pressure  127 mmHg   AO Diastolic Pressure  55 mmHg   AO Mean  80 mmHg   LV Systolic Pressure  142 mmHg   LV Diastolic Pressure  11 mmHg   LV EDP  21 mmHg   Arterial Occlusion Pressure Extended Systolic Pressure  124 mmHg   Arterial Occlusion Pressure Extended Diastolic Pressure  53 mmHg   Arterial Occlusion Pressure Extended Mean Pressure  78 mmHg   Left Ventricular Apex Extended Systolic Pressure  140 mmHg    Left Ventricular Apex Extended Diastolic Pressure  12 mmHg   Left Ventricular Apex Extended EDP Pressure  22 mmHg   QP/QS  1   TPVR Index  12.82 HRUI   TSVR  Index  17.1 HRUI   PVR SVR Ratio  0.78   TPVR/TSVR Ratio  0.75        Impression:  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  She describes a long progression of symptoms of exertional shortness of breath, orthopnea, PND and LE edema consistent with chronic combined systolic and diastolic congestive heart failure with 2 recent hospitalizations for acute exacerbations of class IV symptoms associated with recurrent atrial fibrillation and chest pain.  I have personally reviewed the patient's recent echocardiograms and diagnostic catheterization.  Her aortic valve is tricuspid but severely insufficient.  The aortic annulus is not dilated and in fact her aortic root may be relatively small.  She has a clear subvalvular membrane causing significant stenosis.  LV function is at least mildly reduced.  There were no other significant abnormalities noted on either ECHO study, and RV function appeared normal.  However, right heart cath was notable for severe pulmonary hypertension.  O2 saturation data was not obtained at the time of cath to r/o shunt physiology.  The patient clearly will need AVR with resection of subvalvular aortic membrane.  She would likely benefit from concomitant maze procedure.  Prior to surgery she will need to undergo cardiac MRI/MRA or cardiac-gated CTA heart to r/o other anomalous conditions that might explain her severe pulmonary hypertension, such as sinus venosus ASD and/or anomalous pulmonary venous return.  Repeat right heart cath with O2 saturation run might need to be considered.  Her CHF will need to be optimized medically and PFT's performed to assess for significant COPD.  She needs a dental examination.   Plan:  The  patient was counseled at length regarding treatment alternatives for management of severe aortic insufficiency with subvalvular stenosis including continued medical therapy versus proceeding with aortic valve replacement in the near future.  The natural history of aortic insufficiency was reviewed, as was long term prognosis with medical therapy alone.  Surgical options were discussed at length including conventional surgical aortic valve replacement through either a full median sternotomy or using minimally invasive techniques.  Other alternatives including rapid-deployment bioprosthetic tissue valve replacement, transcatheter aortic valve replacement, patch enlargement of the aortic root, stentless porcine aortic root replacement, valve repair, the Ross autograft procedure, and homograft aortic root replacement were discussed.  Discussion was held comparing the relative risks of mechanical valve replacement with need for lifelong anticoagulation versus use of a bioprosthetic tissue valve and the associated potential for late structural valve deterioration and failure.   The relative risks and benefits of performing a maze procedure at the time of their surgery was discussed at length, including the expected likelihood of long term freedom from recurrent symptomatic atrial fibrillation and/or atrial flutter.    Will arrange for cardiac MRI/MRA or cardiac-gated CTA of the heart as soon as practical and ask for dental service consult.  Will follow   I spent in excess of 120 minutes during the conduct of this hospital consultation and >50% of this time involved direct face-to-face encounter for counseling and/or coordination of the patient's care.   Salvatore Decent. Cornelius Moras, MD 01/11/2016 6:53 PM

## 2016-01-11 NOTE — Progress Notes (Addendum)
   Left and right heart catheterization reveals severe pulmonary artery hypertension, moderate increase in left heart filling pressures with a mean pulmonary capillary wedge of 17 mmHg with an LVEDP of 22 mmHg.  Previous transthoracic and transesophageal echo studies demonstrate significant aortic valve disease with moderate to severe aortic regurgitation and moderate stenosis.  On clinical exam this morning she appeared volume overloaded. She has significant aortic regurgitation by auscultation as well as a systolic murmur.  There is a history of Congenital Heart Disease - told she had "a heart murmur and a hole in her heart". The hospital was in [place]. She does not know the name of the facility. She has had minimal follow-up throughout her life.  We need diuresis to remove excess volume (being careful to avoid excessive diuresis and hypotension related to pulmonary hypertension), obtain information from previous cardiology teams concerning her full congenital diagnosis, and get both surgical (and possibly heart failure) team involved.  Perhaps the treatment of choice at this time will be aortic valve replacement. May need to have a cardiac MRI for morphology.  Concerned about the severity of pulmonary hypertension. Structurally, we have found no evidence of left-to-right shunting that could be causing Eisenmenger phenomenon. Unfortunately, a complete oximetry run was not obtained during right and left heart cath.

## 2016-01-12 ENCOUNTER — Encounter (HOSPITAL_COMMUNITY): Payer: Self-pay | Admitting: *Deleted

## 2016-01-12 ENCOUNTER — Inpatient Hospital Stay (HOSPITAL_COMMUNITY): Payer: Self-pay

## 2016-01-12 ENCOUNTER — Ambulatory Visit: Payer: Self-pay | Admitting: Cardiology

## 2016-01-12 ENCOUNTER — Ambulatory Visit: Payer: Self-pay | Admitting: Physician Assistant

## 2016-01-12 DIAGNOSIS — I351 Nonrheumatic aortic (valve) insufficiency: Secondary | ICD-10-CM

## 2016-01-12 DIAGNOSIS — Q244 Congenital subaortic stenosis: Secondary | ICD-10-CM

## 2016-01-12 LAB — BASIC METABOLIC PANEL
ANION GAP: 10 (ref 5–15)
BUN: 25 mg/dL — ABNORMAL HIGH (ref 6–20)
CALCIUM: 8.4 mg/dL — AB (ref 8.9–10.3)
CO2: 26 mmol/L (ref 22–32)
Chloride: 98 mmol/L — ABNORMAL LOW (ref 101–111)
Creatinine, Ser: 1.05 mg/dL — ABNORMAL HIGH (ref 0.44–1.00)
Glucose, Bld: 91 mg/dL (ref 65–99)
POTASSIUM: 3.4 mmol/L — AB (ref 3.5–5.1)
Sodium: 134 mmol/L — ABNORMAL LOW (ref 135–145)

## 2016-01-12 LAB — CBC
HCT: 35.7 % — ABNORMAL LOW (ref 36.0–46.0)
Hemoglobin: 11.8 g/dL — ABNORMAL LOW (ref 12.0–15.0)
MCH: 30 pg (ref 26.0–34.0)
MCHC: 33.1 g/dL (ref 30.0–36.0)
MCV: 90.8 fL (ref 78.0–100.0)
PLATELETS: 202 10*3/uL (ref 150–400)
RBC: 3.93 MIL/uL (ref 3.87–5.11)
RDW: 16.2 % — AB (ref 11.5–15.5)
WBC: 7.1 10*3/uL (ref 4.0–10.5)

## 2016-01-12 LAB — HEPARIN LEVEL (UNFRACTIONATED)
HEPARIN UNFRACTIONATED: 0.3 [IU]/mL (ref 0.30–0.70)
Heparin Unfractionated: 1.9 IU/mL — ABNORMAL HIGH (ref 0.30–0.70)

## 2016-01-12 LAB — PREALBUMIN: Prealbumin: 15.7 mg/dL — ABNORMAL LOW (ref 18–38)

## 2016-01-12 LAB — BRAIN NATRIURETIC PEPTIDE: B Natriuretic Peptide: 1639.9 pg/mL — ABNORMAL HIGH (ref 0.0–100.0)

## 2016-01-12 LAB — APTT: aPTT: 58 seconds — ABNORMAL HIGH (ref 24–37)

## 2016-01-12 MED ORDER — POTASSIUM CHLORIDE CRYS ER 20 MEQ PO TBCR
30.0000 meq | EXTENDED_RELEASE_TABLET | Freq: Two times a day (BID) | ORAL | Status: AC
  Administered 2016-01-12 (×2): 30 meq via ORAL
  Filled 2016-01-12 (×2): qty 1

## 2016-01-12 MED ORDER — SODIUM CHLORIDE 0.9% FLUSH
10.0000 mL | INTRAVENOUS | Status: DC | PRN
  Administered 2016-01-14 – 2016-01-15 (×2): 10 mL
  Filled 2016-01-12 (×2): qty 40

## 2016-01-12 MED ORDER — FUROSEMIDE 10 MG/ML IJ SOLN
40.0000 mg | Freq: Two times a day (BID) | INTRAMUSCULAR | Status: AC
  Administered 2016-01-12 (×2): 40 mg via INTRAVENOUS
  Filled 2016-01-12 (×2): qty 4

## 2016-01-12 MED ORDER — IOPAMIDOL (ISOVUE-370) INJECTION 76%
INTRAVENOUS | Status: AC
  Administered 2016-01-12: 80 mL
  Filled 2016-01-12: qty 100

## 2016-01-12 MED ORDER — HEPARIN (PORCINE) IN NACL 100-0.45 UNIT/ML-% IJ SOLN
700.0000 [IU]/h | INTRAMUSCULAR | Status: DC
  Administered 2016-01-12: 700 [IU]/h via INTRAVENOUS

## 2016-01-12 NOTE — Hospital Discharge Follow-Up (Signed)
[facility] Care Coordination Note:  Admit date:  01/08/16 Discharge date: TBD Discharge Disposition: Home when stable Patient contact: [phone] (mobile) Emergency contact(s): none  This Case Manager reviewed patient's EMR and determined patient would benefit from post-discharge medical management and chronic care management services through the [facility]. Patient has a history of congestive heart failure, atrial fibrillation with RVR.  This Case Manager met with patient to discuss the services and medical management that can be provided at the [facility]. Patient verbalized understanding and agreed to receive post-discharge care at the [facility].   Patient scheduled for Transitional Care appointment on 01/19/16 at 1030 with Dr. Amao.  Clinic information and appointment time provided to patient. Appointment information also placed on AVS.  Assessment:       Home Environment: Patient lives in an apartment. She indicates her brother is currently staying with her.       Support System: daughters, friends       Level of functioning: Independent       Home DME: none. Patient indicates she was weak after last hospitalization (2/17) and had difficulty ambulating long distances. She indicates she has ability to get wheelchair if needed after discharge.        Home care services: none       Transportation: Patient indicates she typically drives to medical appointments. May need transportation services to [facility] appointment if too weak or unable to drive after discharge. Will determine after discharge.        Food/Nutrition: Patient indicates she has access to needed food.        Medications: Patient indicates she gets her medications from [facility] pharmacy. Patient denies problems obtaining needing medications. She does indicate she has been working with [facility] pharmacy  in completing Xarelto Patient Assistance application.        Identified Barriers: uninsured-Patient indicates she has [facility] for [facility]. Informed patient she would benefit from meeting with Financial Counselor at [facility] to see if clinic can be changed on [employer] Card. Patient informed of Financial Counselor walk-in hours. Patient appreciative of information.        PCP: Dr. Langeland-[facility]    Arranged services:        Services communicated to Brenda Graves-Bigelow, RN CM 

## 2016-01-12 NOTE — Progress Notes (Signed)
Subjective: Sheila Branch had no acute events overnight. She tolerated the cath well. She feels well this morning, breathing comfortably on room air. She responded well to Lasix with significant UOP. Denies palpitations, CP, N/V/D or other symptoms at this time. Tele monitor shows NSR with rate 60s-70s and occasional PACs.  Objective: Vital signs in last 24 hours: Filed Vitals:   01/11/16 1403 01/11/16 1408 01/11/16 2154 01/12/16 0500  BP: 143/61 135/62 120/72 143/48  Pulse: 62 64 60 92  Temp:   98 F (36.7 C) 97 F (36.1 C)  TempSrc:   Oral Oral  Resp: Height:      Weight:    113 lb 1.6 oz (51.302 kg)  SpO2: 94% 94% 94% 97%   Weight change: 6.4 oz (0.181 kg)  Intake/Output Summary (Last 24 hours) at 01/12/16 1018 Last data filed at 01/12/16 0837  Gross per 24 hour  Intake 1125.65 ml  Output    100 ml  Net 1025.65 ml     Gen: Well-appearing, alert and oriented to person, place, and time HEENT: Oropharynx clear without erythema or exudate.  Neck: No cervical LAD, no thyromegaly or nodules, bilateral JVD noted. CV: Normal rate, regular rhythm, III/VI systolic murmur heard best at Rt 2nd ICS that radiates to the neck and III/VI blowing diastolic murmur heard best at LSB but auscultated throughout the precordium. Pulmonary: Normal effort, coarse crackles heard at R lung base. L lung CTA. Abdominal: Soft, mild tenderness in RUQ with hepatomegaly 3cm below the costal margin. No splenomegaly noted. No rebound or guardinc. NABS. Extremities: Distal pulses 2+ in upper and lower extremities bilaterally, no tenderness, erythema. Pitting edema to the knees bilaterally. Neuro: CN II-XII grossly intact, no focal weakness or sensory deficits noted  Lab Results: Basic Metabolic Panel:  Recent Labs Lab 01/11/16 0424 01/12/16 0528  NA 132* 134*  K 3.5 3.4*  CL 99* 98*  CO2 23 26  GLUCOSE 99 91  BUN 29* 25*  CREATININE 1.07* 1.05*  CALCIUM 8.3* 8.4*   CBC:  Recent  Labs Lab 01/08/16 0750  01/11/16 0424 01/12/16 0528  WBC 11.0*  < > 8.8 7.1  NEUTROABS 8.4*  --   --   --   HGB 14.7  < > 12.3 11.8*  HCT 44.0  < > 36.7 35.7*  MCV 93.6  < > 92.2 90.8  PLT 309  < > 215 202  < > = values in this interval not displayed.  Assessment/Plan: Afib with RVR and Acute HFrEF exacerbation: Patient with h/o congenital valvular heart disease, severe AS and AI, and reduced EF, presenting with RVR, orthopnea, nonproductive cough, LE edema, and elevated BNP. Acute HFrEF exacerbation likely 2/2 acute afib exacerbation.Patient converted to NSR on amiodarone. CHADs-VASc of 2 (gender, CHF), so anticoagulation is warranted. L+RHC on 5/3 showed severe PAH, with moderate volume overload (WP 17, LVEDP 22).  - Cards and CVTS following, appreciate rec's - CT angiography, possible repeat RHC with sat run to e/f shunting pathology and pulm vein anomalies - Continue with careful diuresis, furosemide  IV x 2 doses today, with close monitoring of renal function (especially given contrast load today) - Amiodarone PO - Heparin gtt - Telemetry - Strict I/Os  C. Diff colitis: Patient complains of multiple episodes daily of watery diarrhea with incontinence. Given recent admission for CAP and treatment with fluoroquinolone, C diff or other infectious etiology seems most likely. C. Dif antigen positive, toxin negative. Initially on PO vanc  but dc'ed with resolution of symptoms. -CTM  Dispo: Disposition is deferred at this time, awaiting improvement of current medical problems.  Anticipated discharge in approximately 1-3 day(s).   The patient does have a current PCP (Dawn Marland Mcalpine, MD) and does need an [facility] hospital follow-up appointment after discharge.  The patient does not have transportation limitations that hinder transportation to clinic appointments.   LOS: 4 days   Darrick Huntsman, MD 01/12/2016, 10:18 AM

## 2016-01-12 NOTE — Progress Notes (Addendum)
ANTICOAGULATION CONSULT NOTE - FOLLOW UP  Pharmacy Consult:  Heparin Indication: atrial fibrillation  Allergies  Allergen Reactions  . Penicillins Cross Reactors Swelling and Other (See Comments)    Mouth Ulcers Has patient had a PCN reaction causing immediate rash, facial/tongue/throat swelling, SOB or lightheadedness with hypotension: Yes Has patient had a PCN reaction causing severe rash involving mucus membranes or skin necrosis: Yes Has patient had a PCN reaction that required hospitalization No Has patient had a PCN reaction occurring within the last 10 years: No If all of the above answers are "NO", then may proceed with Cephalosporin use.    Patient Measurements: Height: 5\' 3"  (160 cm) Weight: 113 lb 1.6 oz (51.302 kg) IBW/kg (Calculated) : 52.4  Heparin dosing weight 51 kg  Vital Signs: Temp: 98.3 F (36.8 C) (05/04 1328) Temp Source: Oral (05/04 1328) BP: 140/62 mmHg (05/04 1328) Pulse Rate: 67 (05/04 1328)  Labs:  Recent Labs  01/10/16 0541 01/10/16 0809 01/11/16 0424 01/12/16 0528 01/12/16 1303  HGB 11.9*  --  12.3 11.8*  --   HCT 35.5*  --  36.7 35.7*  --   PLT 212  --  215 202  --   APTT 77*  --  64* 58*  --   HEPARINUNFRC 1.22*  --  0.48 0.30 1.90*  CREATININE  --  1.39* 1.07* 1.05*  --    Estimated Creatinine Clearance: 55.9 mL/min (by C-G formula based on Cr of 1.05).   Assessment: 76 YOF with history of Afib on Xarelto PTA for hx Afib, last dose 4/29. Heparin while cardiac w/u ongoing.  Last HL now jumped up to 1.9?, aPTT was reported to RN as elevated. Levels are correlating now so will just follow up HL. Was on 800 units/hr on 5/1 and had elevated levels as well even though seems to be borderline low at 700 units/hr. Cath 5/3 showed no CAD but elevated R sided pressures. Continue further work up per Cards. Hgb stable at 11.8, plts wnl.  Goal of Therapy:  Heparin level 0.3-0.7 units/ml  Monitor platelets by anticoagulation protocol: Yes    Plan:  Hold heparin gtt for 1 hr Decrease heparin gtt to 700 units/hr Check HL later tonight Monitor daily HL, CBC, s/s of bleed  Enzo Bi, PharmD, BCPS Clinical Pharmacist Pager [phone] 01/12/2016 2:03 PM

## 2016-01-12 NOTE — Progress Notes (Addendum)
ANTICOAGULATION CONSULT NOTE - FOLLOW UP  Pharmacy Consult:  Heparin Indication: atrial fibrillation  Allergies  Allergen Reactions  . Penicillins Cross Reactors Swelling and Other (See Comments)    Mouth Ulcers Has patient had a PCN reaction causing immediate rash, facial/tongue/throat swelling, SOB or lightheadedness with hypotension: Yes Has patient had a PCN reaction causing severe rash involving mucus membranes or skin necrosis: Yes Has patient had a PCN reaction that required hospitalization No Has patient had a PCN reaction occurring within the last 10 years: No If all of the above answers are "NO", then may proceed with Cephalosporin use.    Patient Measurements: Height: 5\' 3"  (160 cm) Weight: 113 lb 1.6 oz (51.302 kg) IBW/kg (Calculated) : 52.4  Heparin dosing weight 51 kg  Vital Signs: Temp: 97 F (36.1 C) (05/04 0500) Temp Source: Oral (05/04 0500) BP: 143/48 mmHg (05/04 0500) Pulse Rate: 92 (05/04 0500)  Labs:  Recent Labs  01/10/16 0541 01/10/16 0809 01/11/16 0424 01/12/16 0528  HGB 11.9*  --  12.3 11.8*  HCT 35.5*  --  36.7 35.7*  PLT 212  --  215 202  APTT 77*  --  64* 58*  HEPARINUNFRC 1.22*  --  0.48 0.30  CREATININE  --  1.39* 1.07* 1.05*   Estimated Creatinine Clearance: 55.9 mL/min (by C-G formula based on Cr of 1.05).   Assessment: 87 YOF with history of Afib on Xarelto PTA for hx Afib, last dose 4/29. Heparin while cardiac w/u ongoing.  Heparin level and aPTT almost correlating. aPTT slightly subtherapeutic on 700 units/hr. CBC stable. No issues with line or bleeding reported per RN.  Goal of Therapy:  Heparin level 0.3-0.7 units/ml  aPTT 66 - 102 sec Monitor platelets by anticoagulation protocol: Yes    Plan:  Increase heparin gtt to 800 units/hr Will f/u 6 hr aPTT and heparin level - should be correlating at that point  Christoper Fabian, PharmD, BCPS Clinical pharmacist, pager [phone] 01/12/2016 6:36 AM

## 2016-01-12 NOTE — Progress Notes (Signed)
Subjective: Sheila Branch had no acute events overnight. She does not report abdominal pain, chest pain or palpitations.  Objective: Vital signs in last 24 hours: Filed Vitals:   01/11/16 1403 01/11/16 1408 01/11/16 2154 01/12/16 0500  BP: 143/61 135/62 120/72 143/48  Pulse: 62 64 60 92  Temp:   98 F (36.7 C) 97 F (36.1 C)  TempSrc:   Oral Oral  Resp: Height:      Weight:    51.302 kg (113 lb 1.6 oz)  SpO2: 94% 94% 94% 97%   Weight change: 0.181 kg (6.4 oz)  Intake/Output Summary (Last 24 hours) at 01/12/16 1006 Last data filed at 01/12/16 0837  Gross per 24 hour  Intake 1125.65 ml  Output    100 ml  Net 1025.65 ml   Physical exam: General appearance: alert, cooperative and no distress Lungs: clear to auscultation bilaterally Heart: regularly irregular rhythm, systolic murmur: holosystolic 3/6, at 2nd right intercostal space radiates to carotids and diastolic murmur: 1/6,XW 2nd left intercostal space, radiates to aortic area and apex Abdomen: soft, non-tender; bowel sounds normal; no masses, mild hepatomegaly Extremities: extremities normal, atraumatic, no cyanosis, no edema  Lab Results: Basic Metabolic Panel:  Recent Labs  96/04/54 0424 01/12/16 0528  NA 132* 134*  K 3.5 3.4*  CL 99* 98*  CO2 23 26  GLUCOSE 99 91  BUN 29* 25*  CREATININE 1.07* 1.05*  CALCIUM 8.3* 8.4*   CBC:  Recent Labs  01/11/16 0424 01/12/16 0528  WBC 8.8 7.1  HGB 12.3 11.8*  HCT 36.7 35.7*  MCV 92.2 90.8  PLT 215 202    Micro Results: Recent Results (from the past 240 hour(s))  C difficile quick scan w PCR reflex     Status: Abnormal   Collection Time: 01/08/16  5:08 PM  Result Value Ref Range Status   C Diff antigen POSITIVE (A) NEGATIVE Final   C Diff toxin NEGATIVE NEGATIVE Final   C Diff interpretation   Final    C. difficile present, but toxin not detected. This indicates colonization. In most cases, this does not require treatment. If patient has signs and  symptoms consistent with colitis, consider treatment. Requires ENTERIC precautions.  Clostridium Difficile by PCR     Status: None   Collection Time: 01/08/16  5:08 PM  Result Value Ref Range Status   Toxigenic C Difficile by pcr NEGATIVE NEGATIVE Final  MRSA PCR Screening     Status: None   Collection Time: 01/08/16  9:48 PM  Result Value Ref Range Status   MRSA by PCR NEGATIVE NEGATIVE Final    Comment:        The GeneXpert MRSA Assay (FDA approved for NASAL specimens only), is one component of a comprehensive MRSA colonization surveillance program. It is not intended to diagnose MRSA infection nor to guide or monitor treatment for MRSA infections.   Urine culture     Status: None (Preliminary result)   Collection Time: 01/11/16  9:05 PM  Result Value Ref Range Status   Specimen Description URINE, CLEAN CATCH  Final   Special Requests NONE  Final   Culture NO GROWTH < 12 HOURS  Final   Report Status PENDING  Incomplete   Studies/Results: Dg Orthopantogram  01/12/2016  CLINICAL DATA:  Poor dentition, cardiac valve abnormality EXAM: ORTHOPANTOGRAM/PANORAMIC COMPARISON:  None. FINDINGS: The Panorex view of the mandible is very suboptimal with the mandibular angles not able to be assessed. Repeat Panorex view is  recommended. The condylar heads appear to be in normal position. No acute fracture is seen. Probable caries are noted involving the lower right first molar, the right upper premolar with dental roots remaining in the left lower second molar region with probable adjacent cystic abnormality possibly a small dentigerous cyst. There may also be caries involvement of the left upper third molar. IMPRESSION: 1. Probable dental caries as described above. 2. Suboptimal assessment of the mandibular angles on this Panorex view. Consider repeat if warranted clinically. Electronically Signed   By: Dwyane Dee M.D.   On: 01/12/2016 08:22   Dg Chest 2 View  01/12/2016  CLINICAL DATA:  Shortness  of breath, cough, CHF EXAM: CHEST  2 VIEW COMPARISON:  Portable chest x-ray of 01/08/2016 and 12/22/2015 FINDINGS: There are more prominent interstitial markings with small effusions and cardiomegaly, most consistent with interstitial edema. No focal pneumonia is seen. The bones are osteopenic. IMPRESSION: Interstitial edema with cardiomegaly and small effusions. Electronically Signed   By: Dwyane Dee M.D.   On: 01/12/2016 08:18   Medications: I have reviewed the patient's current medications. Scheduled Meds: . amiodarone  200 mg Oral BID  . chlorhexidine  15 mL Mouth/Throat BID  . furosemide  40 mg Intravenous BID  . pneumococcal 23 valent vaccine  0.5 mL Intramuscular Tomorrow-1000  . potassium chloride  30 mEq Oral BID  . sodium chloride flush  3 mL Intravenous Q12H   Continuous Infusions: . heparin 800 Units/hr (01/12/16 0951)   PRN Meds:.sodium chloride, acetaminophen, ondansetron (ZOFRAN) IV, sodium chloride flush, sodium chloride flush Assessment/Plan: Sheila Branch is a 45 yo female with a afib with a history of congenital heart disease, being seen for an episode of afib and acute congestive heart failure exacerbation.   1. Afib with RVR adn acute HFrEF exacerbation: Patient is in sinus rhythm and reports no episodes of SOB or palpitations. Left and right heart catheritization showed severe PAH -cardiology recommendation: given lasix for fluid overload; MRI/MRA to determine heart defect; considering aortic valve replace per CVTS  -rate and rhythm currently controlled on oral amiodarone -anticoagulated on heparin  2. C. Diff colitis: Lab results showed antigen positive, toxin negative stool. No recurrent diarrhea or abdominal pain -symptoms have resolved, PO vanc was discontinued. Will continue to monitor clinically   This is a Psychologist, occupational Note.  The care of the patient was discussed with Dr. Kyung Rudd and the assessment and plan formulated with their assistance.  Please see their  attached note for official documentation of the daily encounter.   LOS: 4 days   [place], Med Student 01/12/2016, 10:06 AM

## 2016-01-12 NOTE — Progress Notes (Signed)
       Patient Name: Sheila Branch Date of Encounter: 01/12/2016    SUBJECTIVE: Patient feels better this morning. She feels there was significant urine output last evening after Lasix. This is not reflected in the intake and output charting.  TELEMETRY:  Sinus rhythm with PACs Filed Vitals:   01/11/16 1403 01/11/16 1408 01/11/16 2154 01/12/16 0500  BP: 143/61 135/62 120/72 143/48  Pulse: 62 64 60 92  Temp:   98 F (36.7 C) 97 F (36.1 C)  TempSrc:   Oral Oral  Resp: 20 20 18    Height:      Weight:    113 lb 1.6 oz (51.302 kg)  SpO2: 94% 94% 94% 97%    Intake/Output Summary (Last 24 hours) at 01/12/16 1010 Last data filed at 01/12/16 0837  Gross per 24 hour  Intake 1125.65 ml  Output    100 ml  Net 1025.65 ml   LABS: Basic Metabolic Panel:  Recent Labs  44/01/02 0424 01/12/16 0528  NA 132* 134*  K 3.5 3.4*  CL 99* 98*  CO2 23 26  GLUCOSE 99 91  BUN 29* 25*  CREATININE 1.07* 1.05*  CALCIUM 8.3* 8.4*   CBC:  Recent Labs  01/11/16 0424 01/12/16 0528  WBC 8.8 7.1  HGB 12.3 11.8*  HCT 36.7 35.7*  MCV 92.2 90.8  PLT 215 202     Radiology/Studies:  No new data  Physical Exam: Blood pressure 143/48, pulse 92, temperature 97 F (36.1 C), temperature source Oral, resp. rate 18, height 5\' 3"  (1.6 m), weight 113 lb 1.6 oz (51.302 kg), last menstrual period 12/12/2015, SpO2 97 %. Weight change: 6.4 oz (0.181 kg)  Wt Readings from Last 3 Encounters:  01/12/16 113 lb 1.6 oz (51.302 kg)  01/06/16 104 lb (47.174 kg)  01/06/16 100 lb (45.36 kg)    3 to 4/6 crescendo decrescendo systolic and 3/6 decrescendo diastolic aortic valve murmurs. Marked external and internal jugular distention Decreased breath sounds at both bases Peripheral edema to the knees bilaterally.  ASSESSMENT:  1. Severe aortic valve regurgitation and subvalvular stenosis. 2. Severe pulmonary hypertension, some of which is related to volume overload. The magnitude of pulmonary  hypertension raises a question of left right shunting with pulmonary vascular damage. We need to be certain that there are no lesions that could be causing left right shunt. 3. Paroxysmal atrial fibrillation, now with rhythm control on amiodarone. 4. Recent C. difficile enterocolitis  Plan:  1. Diuresis 2. Appreciate CT surgery evaluation and input 3. Structural analysis with either MRI or CT for cardiac morphology to exclude left right shunt lesion such as ASD and anomalous pulmonary venous return. 4. With diuresis, we need to be careful with contrast to avoid kidney injury.  Selinda Eon 01/12/2016, 10:10 AM

## 2016-01-13 ENCOUNTER — Inpatient Hospital Stay (HOSPITAL_COMMUNITY): Payer: Self-pay

## 2016-01-13 DIAGNOSIS — I5042 Chronic combined systolic (congestive) and diastolic (congestive) heart failure: Secondary | ICD-10-CM

## 2016-01-13 LAB — PULMONARY FUNCTION TEST
DL/VA % pred: 162 %
DL/VA: 7.39 ml/min/mmHg/L
DLCO COR % PRED: 68 %
DLCO COR: 14.83 ml/min/mmHg
DLCO UNC % PRED: 63 %
DLCO UNC: 13.77 ml/min/mmHg
FEF 25-75 POST: 1.32 L/s
FEF 25-75 Pre: 1.05 L/sec
FEF2575-%Change-Post: 25 %
FEF2575-%PRED-POST: 45 %
FEF2575-%PRED-PRE: 36 %
FEV1-%Change-Post: 4 %
FEV1-%PRED-POST: 34 %
FEV1-%Pred-Pre: 33 %
FEV1-POST: 0.96 L
FEV1-Pre: 0.92 L
FEV1FVC-%Change-Post: 10 %
FEV1FVC-%PRED-PRE: 101 %
FEV6-%CHANGE-POST: -3 %
FEV6-%Pred-Post: 31 %
FEV6-%Pred-Pre: 32 %
FEV6-POST: 1.04 L
FEV6-Pre: 1.07 L
FEV6FVC-%PRED-POST: 102 %
FEV6FVC-%Pred-Pre: 102 %
FVC-%CHANGE-POST: -5 %
FVC-%Pred-Post: 30 %
FVC-%Pred-Pre: 32 %
FVC-Post: 1.04 L
FVC-Pre: 1.1 L
PRE FEV1/FVC RATIO: 83 %
Post FEV1/FVC ratio: 92 %
Post FEV6/FVC ratio: 100 %
Pre FEV6/FVC Ratio: 100 %
RV % pred: 103 %
RV: 1.61 L
TLC % PRED: 69 %
TLC: 3.28 L

## 2016-01-13 LAB — BASIC METABOLIC PANEL
Anion gap: 10 (ref 5–15)
BUN: 20 mg/dL (ref 6–20)
CO2: 28 mmol/L (ref 22–32)
Calcium: 8.3 mg/dL — ABNORMAL LOW (ref 8.9–10.3)
Chloride: 98 mmol/L — ABNORMAL LOW (ref 101–111)
Creatinine, Ser: 1.01 mg/dL — ABNORMAL HIGH (ref 0.44–1.00)
Glucose, Bld: 93 mg/dL (ref 65–99)
POTASSIUM: 3.8 mmol/L (ref 3.5–5.1)
SODIUM: 136 mmol/L (ref 135–145)

## 2016-01-13 LAB — CBC
HCT: 34.3 % — ABNORMAL LOW (ref 36.0–46.0)
Hemoglobin: 11.3 g/dL — ABNORMAL LOW (ref 12.0–15.0)
MCH: 30 pg (ref 26.0–34.0)
MCHC: 32.9 g/dL (ref 30.0–36.0)
MCV: 91 fL (ref 78.0–100.0)
PLATELETS: 184 10*3/uL (ref 150–400)
RBC: 3.77 MIL/uL — AB (ref 3.87–5.11)
RDW: 16.2 % — ABNORMAL HIGH (ref 11.5–15.5)
WBC: 6.9 10*3/uL (ref 4.0–10.5)

## 2016-01-13 LAB — URINE CULTURE

## 2016-01-13 LAB — HEPARIN LEVEL (UNFRACTIONATED)
HEPARIN UNFRACTIONATED: 0.34 [IU]/mL (ref 0.30–0.70)
HEPARIN UNFRACTIONATED: 1.96 [IU]/mL — AB (ref 0.30–0.70)

## 2016-01-13 LAB — HEMOGLOBIN A1C
Hgb A1c MFr Bld: 6.1 % — ABNORMAL HIGH (ref 4.8–5.6)
Mean Plasma Glucose: 128 mg/dL

## 2016-01-13 MED ORDER — ALBUTEROL SULFATE (2.5 MG/3ML) 0.083% IN NEBU
2.5000 mg | INHALATION_SOLUTION | Freq: Once | RESPIRATORY_TRACT | Status: AC
  Administered 2016-01-13: 2.5 mg via RESPIRATORY_TRACT

## 2016-01-13 MED ORDER — POTASSIUM CHLORIDE CRYS ER 20 MEQ PO TBCR
30.0000 meq | EXTENDED_RELEASE_TABLET | Freq: Two times a day (BID) | ORAL | Status: AC
  Administered 2016-01-13 (×2): 30 meq via ORAL
  Filled 2016-01-13 (×2): qty 1

## 2016-01-13 MED ORDER — FUROSEMIDE 10 MG/ML IJ SOLN
40.0000 mg | Freq: Two times a day (BID) | INTRAMUSCULAR | Status: AC
  Administered 2016-01-13 (×2): 40 mg via INTRAVENOUS
  Filled 2016-01-13 (×2): qty 4

## 2016-01-13 MED ORDER — RIVAROXABAN 20 MG PO TABS
20.0000 mg | ORAL_TABLET | Freq: Every day | ORAL | Status: DC
  Administered 2016-01-13 – 2016-01-15 (×3): 20 mg via ORAL
  Filled 2016-01-13 (×3): qty 1

## 2016-01-13 NOTE — Progress Notes (Signed)
[address]       [name] [address]             [phone]     CARDIOTHORACIC SURGERY PROGRESS NOTE  2 Days Post-Op  S/P Procedure(s) (LRB): Right/Left Heart Cath and Coronary Angiography (N/A)  Subjective: Reports feeling much better.  Breathing improved  Objective: Vital signs in last 24 hours: Temp:  [97.8 F (36.6 C)-98.6 F (37 C)] 98.6 F (37 C) (05/05 1404) Pulse Rate:  [65-73] 73 (05/05 1404) Cardiac Rhythm:  [-] Normal sinus rhythm;Bundle branch block (05/05 0733) Resp:  [16-18] 18 (05/05 1404) BP: (139-155)/(61-90) 139/88 mmHg (05/05 1404) SpO2:  [93 %-95 %] 94 % (05/05 1404) Weight:  [109 lb 14.4 oz (49.85 kg)] 109 lb 14.4 oz (49.85 kg) (05/05 0533)  Physical Exam:  Rhythm:   sinus  Breath sounds: clear  Heart sounds:  RRR w/ systolic and diastolic murmurs  Incisions:  n/a  Abdomen:  soft  Extremities:  Warm, mild LE edema   Intake/Output from previous day: 05/04 0701 - 05/05 0700 In: 315.1 [P.O.:240; I.V.:75.1] Out: 1850 [Urine:1850] Intake/Output this shift: Total I/O In: 480 [P.O.:480] Out: -   Lab Results:  Recent Labs  01/12/16 0528 01/13/16 0218  WBC 7.1 6.9  HGB 11.8* 11.3*  HCT 35.7* 34.3*  PLT 202 184   BMET:  Recent Labs  01/12/16 0528 01/13/16 0218  NA 134* 136  K 3.4* 3.8  CL 98* 98*  CO2 26 28  GLUCOSE 91 93  BUN 25* 20  CREATININE 1.05* 1.01*  CALCIUM 8.4* 8.3*    CBG (last 3)  No results for input(s): GLUCAP in the last 72 hours. PT/INR:  No results for input(s): LABPROT, INR in the last 72 hours.  CXR:  N/A  Cardiac/Coronary CT TECHNIQUE: The patient was scanned on a Philips 256 scanner. FINDINGS: A 120 kV prospective scan was triggered in the descending thoracic aorta at 111 HU's. Axial non-contrast 3 mm slices were carried out through the heart. The data set was analyzed on a dedicated work station and scored using the Agatson method. Gantry rotation speed was 270 msecs and  collimation was .9 mm. No beta blockade or NTG were given. The 3D data set was reconstructed in 5% intervals of the 67-82 % of the R-R cycle. Diastolic phases were analyzed on a dedicated work station using MPR, MIP and VRT modes. The patient received 80 cc of contrast. Aorta: Normal size of the aortic root (32 x 31 x 29 mm) and visualized portion of the ascending aorta. Aortic Valve: Trileaflet, mildly calcified, focal calcification of the left coronary cusp. There is a subaortic membrane present in the LVOT located 6 mm bellow the non-coronary cusp. Coronary Arteries: Normal origin. Left dominance. No evidence of CAD. Dilated pulmonary artery measuring 35 mm consistent with pulmonary hypertension. There are 4 pulmonary veins that are draining normally into the left atrium. There is no anomalous pulmonary vein drainage. No ASD or VSD is seen. IMPRESSION: 1. Normal size of the aortic root and visualized portion of the ascending aorta. 2. Mildly thickened and calcified trileaflet aortic valve with leaflet non-coaptation and subaortic membrane located 6 mm bellow the non-coronary cusp. 3. No ASD or VSD seen. Normal pulmonary vein drainage in the LVOT. 4. Dilated pulmonary artery consistent with pulmonary hypertension. 5. Normal coronary origin with left dominance. No CAD. Tobias Alexander Electronically Signed  By: Tobias Alexander  On: 01/12/2016 16:54  Study Result     EXAM: OVER-READ INTERPRETATION CT CHEST  The following report is an over-read performed by radiologist Dr. Irma Newness [facility] on 01/12/2016. This over-read does not include interpretation of cardiac or coronary anatomy or pathology. The coronary CTA interpretation by the cardiologist is attached.  COMPARISON: Chest radiographs today and 01/08/2016.  FINDINGS: Mediastinum/Nodes: No enlarged mediastinal or hilar lymph nodes are identified. Cardiovascular findings are  dictated separately.  Lungs/Pleura: There are moderate-sized pleural effusions, larger on the right. There is associated dependent atelectasis at both lung bases. In addition, there are patchy ground-glass opacities in both lungs, probably a combination of edema and atelectasis.  Upper abdomen: There is prominent reflux of contrast into the IVC and hepatic veins. The hepatic veins appear mildly dilated.  Musculoskeletal: Generalized soft tissue edema suspicious for anasarca. No focal osseous abnormalities are seen.  IMPRESSION: 1. Cardiomegaly, pulmonary edema and bilateral pleural effusions consistent with congestive heart failure. 2. Associated atelectasis at both lung bases. 3. Cardiovascular findings dictated separately  Electronically Signed: By: Carey Bullocks M.D. 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  She does not wish to remain in the hospital and have surgery during this admission.  I will be happy to see her in follow up in the office in 3 - 4 weeks.  I will f/u Monday if she remains in the hospital at that time.   I spent in excess of 15 minutes during the conduct of this hospital encounter and >50% of this time involved direct face-to-face encounter with the patient for counseling and/or coordination of their care.   Purcell Nails, MD 01/13/2016 2:13 PM

## 2016-01-13 NOTE — Progress Notes (Addendum)
ANTICOAGULATION CONSULT NOTE - FOLLOW UP  Pharmacy Consult:  Heparin Indication: atrial fibrillation  Allergies  Allergen Reactions  . Penicillins Cross Reactors Swelling and Other (See Comments)    Mouth Ulcers Has patient had a PCN reaction causing immediate rash, facial/tongue/throat swelling, SOB or lightheadedness with hypotension: Yes Has patient had a PCN reaction causing severe rash involving mucus membranes or skin necrosis: Yes Has patient had a PCN reaction that required hospitalization No Has patient had a PCN reaction occurring within the last 10 years: No If all of the above answers are "NO", then may proceed with Cephalosporin use.    Patient Measurements: Height: 5\' 3"  (160 cm) Weight: 113 lb 1.6 oz (51.302 kg) IBW/kg (Calculated) : 52.4  Heparin dosing weight 51 kg  Vital Signs: Temp: 98.2 F (36.8 C) (05/04 1941) Temp Source: Oral (05/04 1941) BP: 155/61 mmHg (05/04 1941) Pulse Rate: 67 (05/04 1941)  Labs:  Recent Labs  01/10/16 0541 01/10/16 0809 01/11/16 0424 01/12/16 0528 01/12/16 1303 01/12/16 2330  HGB 11.9*  --  12.3 11.8*  --   --   HCT 35.5*  --  36.7 35.7*  --   --   PLT 212  --  215 202  --   --   APTT 77*  --  64* 58*  --   --   HEPARINUNFRC 1.22*  --  0.48 0.30 1.90* 1.96*  CREATININE  --  1.39* 1.07* 1.05*  --   --    Estimated Creatinine Clearance: 55.9 mL/min (by C-G formula based on Cr of 1.05).   Assessment: 55 YOF with history of Afib on Xarelto PTA for hx Afib, last dose 4/29. Heparin while cardiac w/u ongoing. Heparin level continues to be supratherapeutic - up to 1.96 however it appears that the last 2 levels were drawn from the PICC line and the heparin is running peripherally but in the same arm as the PICC line so these levels are likely contaminated with heparin. Pt was therapeutic previously on 700-800 units/hr.  Goal of Therapy:  Heparin level 0.3-0.7 units/ml  Monitor platelets by anticoagulation protocol: Yes    Plan:  STAT heparin level from arm/hand opposite where heparin running  Christoper Fabian, PharmD, BCPS Clinical pharmacist, pager [phone] 01/13/2016 1:06 AM   Addendum: Repeat heparin level 0.34 (therapeutic) drawn from hand in arm opposite where heparin running. Will continue heparin at 700 units/hr and f/u daily HL.   Christoper Fabian, PharmD, BCPS Clinical pharmacist, pager [phone] 01/13/2016 2:55 AM

## 2016-01-13 NOTE — Progress Notes (Addendum)
       Patient Name: Sheila Branch Date of Encounter: 01/13/2016    SUBJECTIVE: Breathing is improved. Lower extremity swelling has improved.  TELEMETRY:  Sinus rhythm with PACs Filed Vitals:   01/12/16 1328 01/12/16 1743 01/12/16 1941 01/13/16 0533  BP: 140/62 147/90 155/61 145/69  Pulse: 67  67 65  Temp: 98.3 F (36.8 C)  98.2 F (36.8 C) 97.8 F (36.6 C)  TempSrc: Oral  Oral Oral  Resp: 16  16 18   Height:      Weight:    109 lb 14.4 oz (49.85 kg)  SpO2: 97%  95% 93%    Intake/Output Summary (Last 24 hours) at 01/13/16 0739 Last data filed at 01/12/16 1748  Gross per 24 hour  Intake 315.14 ml  Output   1850 ml  Net -1534.86 ml   LABS: Basic Metabolic Panel:  Recent Labs  78/58/85 0528 01/13/16 0218  NA 134* 136  K 3.4* 3.8  CL 98* 98*  CO2 26 28  GLUCOSE 91 93  BUN 25* 20  CREATININE 1.05* 1.01*  CALCIUM 8.4* 8.3*   CBC:  Recent Labs  01/12/16 0528 01/13/16 0218  WBC 7.1 6.9  HGB 11.8* 11.3*  HCT 35.7* 34.3*  MCV 90.8 91.0  PLT 202 184     Radiology/Studies:  CT morphology study-cardiac: IMPRESSION: 1. Normal size of the aortic root and visualized portion of the ascending aorta. 2. Mildly thickened and calcified trileaflet aortic valve with leaflet non-coaptation and subaortic membrane located 6 mm bellow the non-coronary cusp. 3. No ASD or VSD seen. Normal pulmonary vein drainage in the LVOT. 4. Dilated pulmonary artery consistent with pulmonary hypertension. 5. Normal coronary origin with left dominance. No CAD.  Physical Exam: Blood pressure 145/69, pulse 65, temperature 97.8 F (36.6 C), temperature source Oral, resp. rate 18, height 5\' 3"  (1.6 m), weight 109 lb 14.4 oz (49.85 kg), last menstrual period 12/12/2015, SpO2 93 %. Weight change: -3 lb 3.2 oz (-1.452 kg)  Wt Readings from Last 3 Encounters:  01/13/16 109 lb 14.4 oz (49.85 kg)  01/06/16 104 lb (47.174 kg)  01/06/16 100 lb (45.36 kg)   Systolic and diastolic  murmur Lower extremity edema improved Elevated neck veins  ASSESSMENT:  1. Still volume overloaded, with acute on chronic combined systolic and diastolic heart failure 2. Aortic regurgitation, significant along with aortic stenosis and some valvular ring. Surgical intervention will be planned. 3. Severe pulmonary hypertension, the magnitude of which will decrease with diuresis. There clearly seems to be a component of pulmonary hypertension that is not related to left heart failure. 4. Amiodarone therapy with no recurrence of atrial fibrillation. 5. A. fib with maintenance of sinus rhythm.  Plan:  1. Continue diuresis watching kidney function and blood pressure closely. Diuretic therapy has been ordered daily. Consider switching to oral diuretic therapy tomorrow or Sunday. 2. Once euvolemic, discharge on a relatively conservative diuretic regimen to avoid hypotension in the setting of pulmonary hypertension. 3. On amiodarone please keep potassium greater than 3.8. 4. Convert IV heparin to either rivaroxaban as prior to admission. 5. Ambulate   Signed, Lesleigh Noe 01/13/2016, 7:39 AM

## 2016-01-13 NOTE — Progress Notes (Signed)
Subjective: Sheila Branch has had no acute events overnight. She says she is feeling well and hasn't reported chest pain, SOB or palpitations. She says she is trying to keep her legs elevated to reduce the swelling.   Objective: Vital signs in last 24 hours: Filed Vitals:   01/12/16 1328 01/12/16 1743 01/12/16 1941 01/13/16 0533  BP: 140/62 147/90 155/61 145/69  Pulse: 67  67 65  Temp: 98.3 F (36.8 C)  98.2 F (36.8 C) 97.8 F (36.6 C)  TempSrc: Oral  Oral Oral  Resp: Height:     (1.6 m)  Weight:    49.85 kg (109 lb 14.4 oz)  SpO2: 97%  95% 93%   Weight change: -1.452 kg (-3 lb 3.2 oz)  Intake/Output Summary (Last 24 hours) at 01/13/16 1140 Last data filed at 01/13/16 0933  Gross per 24 hour  Intake 555.14 ml  Output   1850 ml  Net -1294.86 ml   Physical exam: General appearance: alert, cooperative and no distress Lungs: clear to auscultation bilaterally Heart: regularly irregular rhythm, systolic murmur: holosystolic 3/6, at 2nd right intercostal space radiates to carotids and diastolic murmur: 1/6,XW 2nd left intercostal space, radiates to aortic area and apex Abdomen: soft, non-tender; bowel sounds normal; no masses, mild hepatomegaly Extremities: bilateral, pitting lower extremity edema extending to just below the knee   Lab Results: Basic Metabolic Panel:  Last Labs      Recent Labs Lab 01/12/16 0528 01/13/16 0218  NA 134* 136  K 3.4* 3.8  CL 98* 98*  CO2 26 28  GLUCOSE 91 93  BUN 25* 20  CREATININE 1.05* 1.01*  CALCIUM 8.4* 8.3*     CBC:  Last Labs      Recent Labs Lab 01/08/16 0750  01/12/16 0528 01/13/16 0218  WBC 11.0* < > 7.1 6.9  NEUTROABS 8.4* --  --  --   HGB 14.7 < > 11.8* 11.3*  HCT 44.0 < > 35.7* 34.3*  MCV 93.6 < > 90.8 91.0  PLT 309 < > 202 184  < > = values in this interval not displayed.         Micro Results: Recent Results (from the past  240 hour(s))  C difficile quick scan w PCR reflex     Status: Abnormal   Collection Time: 01/08/16  5:08 PM  Result Value Ref Range Status   C Diff antigen POSITIVE (A) NEGATIVE Final   C Diff toxin NEGATIVE NEGATIVE Final   C Diff interpretation   Final    C. difficile present, but toxin not detected. This indicates colonization. In most cases, this does not require treatment. If patient has signs and symptoms consistent with colitis, consider treatment. Requires ENTERIC precautions.  Clostridium Difficile by PCR     Status: None   Collection Time: 01/08/16  5:08 PM  Result Value Ref Range Status   Toxigenic C Difficile by pcr NEGATIVE NEGATIVE Final  MRSA PCR Screening     Status: None   Collection Time: 01/08/16  9:48 PM  Result Value Ref Range Status   MRSA by PCR NEGATIVE NEGATIVE Final    Comment:        The GeneXpert MRSA Assay (FDA approved for NASAL specimens only), is one component of a comprehensive MRSA colonization surveillance program. It is not intended to diagnose MRSA infection nor to guide or monitor treatment for MRSA infections.   Urine culture     Status: None (Preliminary result)  Collection Time: 01/11/16  9:05 PM  Result Value Ref Range Status   Specimen Description URINE, CLEAN CATCH  Final   Special Requests NONE  Final   Culture NO GROWTH < 12 HOURS  Final   Report Status PENDING  Incomplete   Studies/Results: Dg Orthopantogram  01/12/2016  CLINICAL DATA:  Poor dentition, cardiac valve abnormality EXAM: ORTHOPANTOGRAM/PANORAMIC COMPARISON:  None. FINDINGS: The Panorex view of the mandible is very suboptimal with the mandibular angles not able to be assessed. Repeat Panorex view is recommended. The condylar heads appear to be in normal position. No acute fracture is seen. Probable caries are noted involving the lower right first molar, the right upper premolar with dental roots remaining in the left lower second molar region with probable adjacent cystic  abnormality possibly a small dentigerous cyst. There may also be caries involvement of the left upper third molar. IMPRESSION: 1. Probable dental caries as described above. 2. Suboptimal assessment of the mandibular angles on this Panorex view. Consider repeat if warranted clinically. Electronically Signed   By: Dwyane Dee M.D.   On: 01/12/2016 08:22   Dg Chest 2 View  01/12/2016  CLINICAL DATA:  Shortness of breath, cough, CHF EXAM: CHEST  2 VIEW COMPARISON:  Portable chest x-ray of 01/08/2016 and 12/22/2015 FINDINGS: There are more prominent interstitial markings with small effusions and cardiomegaly, most consistent with interstitial edema. No focal pneumonia is seen. The bones are osteopenic. IMPRESSION: Interstitial edema with cardiomegaly and small effusions. Electronically Signed   By: Dwyane Dee M.D.   On: 01/12/2016 08:18   Ct Coronary Morp W/cta Cor W/score W/ca W/cm &/or Wo/cm  01/12/2016  ADDENDUM REPORT: 01/12/2016 16:54 CLINICAL DATA:  45 year old female with severe aortic regurgitation and subaortic membrane. Evaluate for other possible congenital heart anomalies. EXAM: Cardiac/Coronary  CT TECHNIQUE: The patient was scanned on a Philips 256 scanner. FINDINGS: A 120 kV prospective scan was triggered in the descending thoracic aorta at 111 HU's. Axial non-contrast 3 mm slices were carried out through the heart. The data set was analyzed on a dedicated work station and scored using the Agatson method. Gantry rotation speed was 270 msecs and collimation was .9 mm. No beta blockade or NTG were given. The 3D data set was reconstructed in 5% intervals of the 67-82 % of the R-R cycle. Diastolic phases were analyzed on a dedicated work station using MPR, MIP and VRT modes. The patient received 80 cc of contrast. Aorta: Normal size of the aortic root (32 x 31 x 29 mm) and visualized portion of the ascending aorta. Aortic Valve: Trileaflet, mildly calcified, focal calcification of the left coronary cusp.  There is a subaortic membrane present in the LVOT located 6 mm bellow the non-coronary cusp. Coronary Arteries: Normal origin. Left dominance. No evidence of CAD. Dilated pulmonary artery measuring 35 mm consistent with pulmonary hypertension. There are 4 pulmonary veins that are draining normally into the left atrium. There is no anomalous pulmonary vein drainage. No ASD or VSD is seen. IMPRESSION: 1. Normal size of the aortic root and visualized portion of the ascending aorta. 2. Mildly thickened and calcified trileaflet aortic valve with leaflet non-coaptation and subaortic membrane located 6 mm bellow the non-coronary cusp. 3.  No ASD or VSD seen.  Normal pulmonary vein drainage in the LVOT. 4.  Dilated pulmonary artery consistent with pulmonary hypertension. 5.  Normal coronary origin with left dominance.  No CAD. Sheila Branch Electronically Signed   By: Sheila Branch   On:  01/12/2016 16:54  01/12/2016  EXAM: OVER-READ INTERPRETATION  CT CHEST The following report is an over-read performed by radiologist Dr. Irma Newness [facility] on 01/12/2016. This over-read does not include interpretation of cardiac or coronary anatomy or pathology. The coronary CTA interpretation by the cardiologist is attached. COMPARISON:  Chest radiographs today and 01/08/2016. FINDINGS: Mediastinum/Nodes: No enlarged mediastinal or hilar lymph nodes are identified. Cardiovascular findings are dictated separately. Lungs/Pleura: There are moderate-sized pleural effusions, larger on the right. There is associated dependent atelectasis at both lung bases. In addition, there are patchy ground-glass opacities in both lungs, probably a combination of edema and atelectasis. Upper abdomen: There is prominent reflux of contrast into the IVC and hepatic veins. The hepatic veins appear mildly dilated. Musculoskeletal: Generalized soft tissue edema suspicious for anasarca. No focal osseous abnormalities are seen. IMPRESSION:  1. Cardiomegaly, pulmonary edema and bilateral pleural effusions consistent with congestive heart failure. 2. Associated atelectasis at both lung bases. 3. Cardiovascular findings dictated separately Electronically Signed: By: Carey Bullocks M.D. On: 01/12/2016 14:15   Medications: I have reviewed the patient's current medications.  Scheduled Meds: . amiodarone  200 mg Oral BID  . chlorhexidine  15 mL Mouth/Throat BID  . furosemide  40 mg Intravenous BID  . pneumococcal 23 valent vaccine  0.5 mL Intramuscular Tomorrow-1000  . potassium chloride  30 mEq Oral BID  . rivaroxaban  20 mg Oral Daily  . sodium chloride flush  3 mL Intravenous Q12H   Continuous Infusions:  PRN Meds:.sodium chloride, acetaminophen, ondansetron (ZOFRAN) IV, sodium chloride flush, sodium chloride flush  Assessment/Plan: Sheila Branch is a 45 yo female with a afib with a history of congenital heart disease, being seen for an episode of afib and acute congestive heart failure exacerbation.   1. Afib with RVR adn acute HFrEF exacerbation: Patient is in sinus rhythm and reports no episodes of SOB or palpitations. Left and right heart catheritization showed severe PAH  -continue with cardiology/CVTS recommendations  -continue to diurese with monitoring of creatinine -rate and rhythm currently controlled on oral amiodarone -anticoagulated on heparin  2. C. Diff colitis: Lab results showed antigen positive, toxin negative stool. No recurrent diarrhea or abdominal pain -symptoms have resolved, PO vanc was discontinued. Will continue to monitor clinically  This is a Psychologist, occupational Note.  The care of the patient was discussed with Dr. Kyung Rudd and the assessment and plan formulated with their assistance.  Please see their attached note for official documentation of the daily encounter.   LOS: 5 days   [place], Med Student 01/13/2016, 11:40 AM

## 2016-01-13 NOTE — Progress Notes (Signed)
Subjective: Sheila Branch had no acute events overnight. She denies any shortness of breath, palpitations, chest pain, N/V/D or abdominal pain. She feels that the swelling in her legs is improving.   Objective: Vital signs in last 24 hours: Filed Vitals:   01/12/16 1328 01/12/16 1743 01/12/16 1941 01/13/16 0533  BP: 140/62 147/90 155/61 145/69  Pulse: 67  67 65  Temp: 98.3 F (36.8 C)  98.2 F (36.8 C) 97.8 F (36.6 C)  TempSrc: Oral  Oral Oral  Resp: Height:     (1.6 m)  Weight:    109 lb 14.4 oz (49.85 kg)  SpO2: 97%  95% 93%   Weight change: -3 lb 3.2 oz (-1.452 kg)  Intake/Output Summary (Last 24 hours) at 01/13/16 1013 Last data filed at 01/13/16 0933  Gross per 24 hour  Intake 555.14 ml  Output   1850 ml  Net -1294.86 ml     Gen: Well-appearing, alert and oriented to person, place, and time HEENT: Oropharynx clear without erythema or exudate.  Neck: No cervical LAD, no thyromegaly or nodules, bilateral JVD noted. CV: Normal rate, regular rhythm, III/VI systolic murmur heard best at Rt 2nd ICS that radiates to the neck and III/VI blowing diastolic murmur heard best at LSB but auscultated throughout the precordium. Pulmonary: Normal effort, CTAB. Abdominal: Soft, mild tenderness in RUQ with hepatomegaly 3cm below the costal margin. No splenomegaly noted. No rebound or guardinc. NABS. Extremities: Distal pulses 2+ in upper and lower extremities bilaterally, no tenderness, erythema. Pitting edema to the mid-calf bilaterally. Neuro: CN II-XII grossly intact, no focal weakness or sensory deficits noted  Lab Results: Basic Metabolic Panel:  Recent Labs Lab 01/12/16 0528 01/13/16 0218  NA 134* 136  K 3.4* 3.8  CL 98* 98*  CO2 26 28  GLUCOSE 91 93  BUN 25* 20  CREATININE 1.05* 1.01*  CALCIUM 8.4* 8.3*   CBC:  Recent Labs Lab 01/08/16 0750  01/12/16 0528 01/13/16 0218  WBC 11.0*  < > 7.1 6.9  NEUTROABS 8.4*  --   --   --   HGB 14.7  < >  11.8* 11.3*  HCT 44.0  < > 35.7* 34.3*  MCV 93.6  < > 90.8 91.0  PLT 309  < > 202 184  < > = values in this interval not displayed.  Assessment/Plan: Afib with RVR and Acute HFrEF exacerbation: Patient with h/o congenital valvular heart disease, severe AS and AI, and reduced EF, presenting with RVR, orthopnea, nonproductive cough, LE edema, and elevated BNP. Acute HFrEF exacerbation likely 2/2 acute afib exacerbation.Patient converted to NSR on amiodarone. CHADs-VASc of 2 (gender, CHF), so anticoagulation is warranted. L+RHC on 5/3 showed severe PAH, with moderate volume overload (WP 17, LVEDP 22). Angiography showed no anomalous pulmonary veins. She is tolerating diuresis well without hypotension or repeat kidney injury currently. - Cards and CVTS following, appreciate rec's - Continue with careful diuresis, with close monitoring of renal function - Amiodarone PO - Heparin gtt - Telemetry - Strict I/Os  C. Diff colitis: Patient complains of multiple episodes daily of watery diarrhea with incontinence. Given recent admission for CAP and treatment with fluoroquinolone, C diff or other infectious etiology seems most likely. C. Dif antigen positive, toxin negative. Initially on PO vanc but dc'ed with resolution of symptoms. -CTM  Dispo: Disposition is deferred at this time, awaiting improvement of current medical problems.  Anticipated discharge in approximately 1-3 day(s).   The patient does  have a current PCP (Dawn Marland Mcalpine, MD) and does need an [facility] hospital follow-up appointment after discharge.  The patient does not have transportation limitations that hinder transportation to clinic appointments.   LOS: 5 days   Darrick Huntsman, MD 01/13/2016, 10:13 AM

## 2016-01-14 ENCOUNTER — Encounter (HOSPITAL_COMMUNITY): Payer: Self-pay | Admitting: Student

## 2016-01-14 DIAGNOSIS — I358 Other nonrheumatic aortic valve disorders: Secondary | ICD-10-CM

## 2016-01-14 DIAGNOSIS — I5023 Acute on chronic systolic (congestive) heart failure: Secondary | ICD-10-CM

## 2016-01-14 DIAGNOSIS — J984 Other disorders of lung: Secondary | ICD-10-CM

## 2016-01-14 DIAGNOSIS — I272 Other secondary pulmonary hypertension: Secondary | ICD-10-CM

## 2016-01-14 LAB — CBC
HEMATOCRIT: 36 % (ref 36.0–46.0)
HEMOGLOBIN: 11.8 g/dL — AB (ref 12.0–15.0)
MCH: 30.8 pg (ref 26.0–34.0)
MCHC: 32.8 g/dL (ref 30.0–36.0)
MCV: 94 fL (ref 78.0–100.0)
Platelets: 206 10*3/uL (ref 150–400)
RBC: 3.83 MIL/uL — ABNORMAL LOW (ref 3.87–5.11)
RDW: 16.7 % — AB (ref 11.5–15.5)
WBC: 7.1 10*3/uL (ref 4.0–10.5)

## 2016-01-14 LAB — BASIC METABOLIC PANEL
Anion gap: 10 (ref 5–15)
BUN: 18 mg/dL (ref 6–20)
CALCIUM: 8.7 mg/dL — AB (ref 8.9–10.3)
CHLORIDE: 97 mmol/L — AB (ref 101–111)
CO2: 30 mmol/L (ref 22–32)
CREATININE: 0.87 mg/dL (ref 0.44–1.00)
GFR calc non Af Amer: >60 mL/min (ref >60)
GLUCOSE: 91 mg/dL (ref 65–99)
Potassium: 4.6 mmol/L (ref 3.5–5.1)
Sodium: 137 mmol/L (ref 135–145)

## 2016-01-14 MED ORDER — FUROSEMIDE 40 MG PO TABS
40.0000 mg | ORAL_TABLET | Freq: Every day | ORAL | Status: DC
  Administered 2016-01-14 – 2016-01-15 (×2): 40 mg via ORAL
  Filled 2016-01-14 (×2): qty 1

## 2016-01-14 MED ORDER — FUROSEMIDE 10 MG/ML IJ SOLN
40.0000 mg | Freq: Once | INTRAMUSCULAR | Status: AC
  Administered 2016-01-14: 40 mg via INTRAVENOUS
  Filled 2016-01-14: qty 4

## 2016-01-14 MED ORDER — ALBUTEROL SULFATE (2.5 MG/3ML) 0.083% IN NEBU: 2.5000 mg | INHALATION_SOLUTION | Freq: Four times a day (QID) | RESPIRATORY_TRACT | Status: DC | PRN

## 2016-01-14 NOTE — Progress Notes (Signed)
Patient ID: CENDY SAGEL, female   DOB: 1971/07/04, 45 y.o.   MRN: [id number]    Patient Name: Sheila Branch Date of Encounter: 01/14/2016     Principal Problem:   Acute on chronic combined systolic and diastolic heart failure [facility]) Active Problems:   Aortic valve disease   Chronic combined systolic and diastolic heart failure (HCC)   Chest pain   Atrial fibrillation with rapid ventricular response (HCC)   Diarrhea   Cough   Atrial fibrillation (HCC)   Persistent atrial fibrillation (HCC)   Aortic insufficiency   Subvalvar aortic stenosis   Chronic combined systolic and diastolic congestive heart failure (HCC)    SUBJECTIVE  No chest pain, dyspnea is improved.   CURRENT MEDS . amiodarone  200 mg Oral BID  . chlorhexidine  15 mL Mouth/Throat BID  . furosemide  40 mg Oral Daily  . pneumococcal 23 valent vaccine  0.5 mL Intramuscular Tomorrow-1000  . rivaroxaban  20 mg Oral Daily  . sodium chloride flush  3 mL Intravenous Q12H    OBJECTIVE  Filed Vitals:   01/13/16 0533 01/13/16 1404 01/13/16 2000 01/14/16 0500  BP: 145/69 139/88 121/68 143/68  Pulse: 65 73 76 64  Temp: 97.8 F (36.6 C) 98.6 F (37 C) 98.9 F (37.2 C) 98.2 F (36.8 C)  TempSrc: Oral Oral Oral   Resp: 18 18 18 18   Height: 5\' 3"  (1.6 m)     Weight: 109 lb 14.4 oz (49.85 kg)   104 lb 3.2 oz (47.265 kg)  SpO2: 93% 94% 94% 97%    Intake/Output Summary (Last 24 hours) at 01/14/16 1058 Last data filed at 01/14/16 0858  Gross per 24 hour  Intake   1040 ml  Output   4960 ml  Net  -3920 ml   Filed Weights   01/12/16 0500 01/13/16 0533 01/14/16 0500  Weight: 113 lb 1.6 oz (51.302 kg) 109 lb 14.4 oz (49.85 kg) 104 lb 3.2 oz (47.265 kg)    PHYSICAL EXAM  General: Pleasant, NAD. Neuro: Alert and oriented X 3. Moves all extremities spontaneously. Psych: Normal affect. HEENT:  Normal  Neck: Supple without bruits or JVD. Lungs:  Resp regular and unlabored, CTA. Heart: RRR with 3/6 systolic  and 2/6 diastolic murmur of AS/AI Abdomen: Soft, non-tender, non-distended, BS + x 4.  Extremities: No clubbing, cyanosis or edema. DP/PT/Radials 2+ and equal bilaterally.  Accessory Clinical Findings  CBC  Recent Labs  01/13/16 0218 01/14/16 0322  WBC 6.9 7.1  HGB 11.3* 11.8*  HCT 34.3* 36.0  MCV 91.0 94.0  PLT 184 206   Basic Metabolic Panel  Recent Labs  01/13/16 0218 01/14/16 0322  NA 136 137  K 3.8 4.6  CL 98* 97*  CO2 28 30  GLUCOSE 93 91  BUN 20 18  CREATININE 1.01* 0.87  CALCIUM 8.3* 8.7*   Liver Function Tests No results for input(s): AST, ALT, ALKPHOS, BILITOT, PROT, ALBUMIN in the last 72 hours. No results for input(s): LIPASE, AMYLASE in the last 72 hours. Cardiac Enzymes No results for input(s): CKTOTAL, CKMB, CKMBINDEX, TROPONINI in the last 72 hours. BNP Invalid input(s): POCBNP D-Dimer No results for input(s): DDIMER in the last 72 hours. Hemoglobin A1C  Recent Labs  01/12/16 0528  HGBA1C 6.1*   Fasting Lipid Panel No results for input(s): CHOL, HDL, LDLCALC, TRIG, CHOLHDL, LDLDIRECT in the last 72 hours. Thyroid Function Tests No results for input(s): TSH, T4TOTAL, T3FREE, THYROIDAB in the last 72 hours.  Invalid input(s): FREET3  TELE  nsr with PVC's  Radiology/Studies  Dg Orthopantogram  01/12/2016  CLINICAL DATA:  Poor dentition, cardiac valve abnormality EXAM: ORTHOPANTOGRAM/PANORAMIC COMPARISON:  None. FINDINGS: The Panorex view of the mandible is very suboptimal with the mandibular angles not able to be assessed. Repeat Panorex view is recommended. The condylar heads appear to be in normal position. No acute fracture is seen. Probable caries are noted involving the lower right first molar, the right upper premolar with dental roots remaining in the left lower second molar region with probable adjacent cystic abnormality possibly a small dentigerous cyst. There may also be caries involvement of the left upper third molar. IMPRESSION:  1. Probable dental caries as described above. 2. Suboptimal assessment of the mandibular angles on this Panorex view. Consider repeat if warranted clinically. Electronically Signed   By: Dwyane Dee M.D.   On: 01/12/2016 08:22   Dg Chest 2 View  01/12/2016  CLINICAL DATA:  Shortness of breath, cough, CHF EXAM: CHEST  2 VIEW COMPARISON:  Portable chest x-ray of 01/08/2016 and 12/22/2015 FINDINGS: There are more prominent interstitial markings with small effusions and cardiomegaly, most consistent with interstitial edema. No focal pneumonia is seen. The bones are osteopenic. IMPRESSION: Interstitial edema with cardiomegaly and small effusions. Electronically Signed   By: Dwyane Dee M.D.   On: 01/12/2016 08:18   Dg Chest 2 View  12/22/2015  CLINICAL DATA:  Shortness of breath.  Recent pneumonia. EXAM: CHEST  2 VIEW COMPARISON:  Radiographs 11/03/2015 and 07/25/2011 FINDINGS: Chronic cardiac enlargement. Mild tortuosity of the thoracic aorta. The pulmonary hila are prominent. There is chronic lung disease. Findings could be due to sarcoidosis. Recommend clinical correlation. There is cephalization blood flow which can be seen with early CHF. No overt pulmonary edema or pleural effusions. The bony thorax is intact. IMPRESSION: Chronic cardiac enlargement. Prominent pulmonary hila and chronic lung disease. Sarcoidosis would be a possibility. Findings could also be due to chronic heart disease and chronic CHF. Cephalization of pulmonary blood flow but no overt pulmonary edema or pleural effusions. Electronically Signed   By: Rudie Meyer M.D.   On: 12/22/2015 16:49   Ct Coronary Morp W/cta Cor W/score W/ca W/cm &/or Wo/cm  01/12/2016  ADDENDUM REPORT: 01/12/2016 16:54 CLINICAL DATA:  45 year old female with severe aortic regurgitation and subaortic membrane. Evaluate for other possible congenital heart anomalies. EXAM: Cardiac/Coronary  CT TECHNIQUE: The patient was scanned on a Philips 256 scanner. FINDINGS: A  120 kV prospective scan was triggered in the descending thoracic aorta at 111 HU's. Axial non-contrast 3 mm slices were carried out through the heart. The data set was analyzed on a dedicated work station and scored using the Agatson method. Gantry rotation speed was 270 msecs and collimation was .9 mm. No beta blockade or NTG were given. The 3D data set was reconstructed in 5% intervals of the 67-82 % of the R-R cycle. Diastolic phases were analyzed on a dedicated work station using MPR, MIP and VRT modes. The patient received 80 cc of contrast. Aorta: Normal size of the aortic root (32 x 31 x 29 mm) and visualized portion of the ascending aorta. Aortic Valve: Trileaflet, mildly calcified, focal calcification of the left coronary cusp. There is a subaortic membrane present in the LVOT located 6 mm bellow the non-coronary cusp. Coronary Arteries: Normal origin. Left dominance. No evidence of CAD. Dilated pulmonary artery measuring 35 mm consistent with pulmonary hypertension. There are 4 pulmonary veins that are draining normally  into the left atrium. There is no anomalous pulmonary vein drainage. No ASD or VSD is seen. IMPRESSION: 1. Normal size of the aortic root and visualized portion of the ascending aorta. 2. Mildly thickened and calcified trileaflet aortic valve with leaflet non-coaptation and subaortic membrane located 6 mm bellow the non-coronary cusp. 3.  No ASD or VSD seen.  Normal pulmonary vein drainage in the LVOT. 4.  Dilated pulmonary artery consistent with pulmonary hypertension. 5.  Normal coronary origin with left dominance.  No CAD. Tobias Alexander Electronically Signed   By: Tobias Alexander   On: 01/12/2016 16:54  01/12/2016  EXAM: OVER-READ INTERPRETATION  CT CHEST The following report is an over-read performed by radiologist Dr. Irma Newness [facility] on 01/12/2016. This over-read does not include interpretation of cardiac or coronary anatomy or pathology. The coronary CTA  interpretation by the cardiologist is attached. COMPARISON:  Chest radiographs today and 01/08/2016. FINDINGS: Mediastinum/Nodes: No enlarged mediastinal or hilar lymph nodes are identified. Cardiovascular findings are dictated separately. Lungs/Pleura: There are moderate-sized pleural effusions, larger on the right. There is associated dependent atelectasis at both lung bases. In addition, there are patchy ground-glass opacities in both lungs, probably a combination of edema and atelectasis. Upper abdomen: There is prominent reflux of contrast into the IVC and hepatic veins. The hepatic veins appear mildly dilated. Musculoskeletal: Generalized soft tissue edema suspicious for anasarca. No focal osseous abnormalities are seen. IMPRESSION: 1. Cardiomegaly, pulmonary edema and bilateral pleural effusions consistent with congestive heart failure. 2. Associated atelectasis at both lung bases. 3. Cardiovascular findings dictated separately Electronically Signed: By: Carey Bullocks M.D. On: 01/12/2016 14:15   Dg Chest Port 1 View  01/08/2016  CLINICAL DATA:  Atrial fibrillation.  Short of breath. EXAM: PORTABLE CHEST 1 VIEW COMPARISON:  12/22/2015 FINDINGS: Severe cardiomegaly. Hyperaeration. Vascular congestion is present but improved. Small right pleural effusion. No pneumothorax. IMPRESSION: Cardiomegaly and vascular congestion are present but vascular congestion has mildly improved. Small right pleural effusion has developed. Electronically Signed   By: Jolaine Click M.D.   On: 01/08/2016 08:09    ASSESSMENT AND PLAN  1. AS/AI - note plans for valve replacement as an outpatient 2. Acute on chronic systolic and diastolic CHF - she is diuresing nicely. Will recheck tomorrow. She might be ready for DC home tomorrow. 3. 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 Atrial fib - she is maintaining NSR.   Gregg Taylor,M.D.  01/14/2016 10:58 AM

## 2016-01-14 NOTE — Progress Notes (Signed)
Subjective: Sheila Branch had no acute events overnight. She is reporting some shortness of breath when she lies back, but otherwise has no chest pain, palpitations, abdominal pain or diarrhea. She says that she's ready to go home as soon as she's able.  Objective: Vital signs in last 24 hours: Filed Vitals:   01/13/16 0533 01/13/16 1404 01/13/16 2000 01/14/16 0500  BP: 145/69 139/88 121/68 143/68  Pulse: 65 73 76 64  Temp: 97.8 F (36.6 C) 98.6 F (37 C) 98.9 F (37.2 C) 98.2 F (36.8 C)  TempSrc: Oral Oral Oral   Resp: 18 18 18 18   Height: 5\' 3"  (1.6 m)     Weight: 49.85 kg (109 lb 14.4 oz)   47.265 kg (104 lb 3.2 oz)  SpO2: 93% 94% 94% 97%   Weight change: -2.586 kg (-5 lb 11.2 oz)  Intake/Output Summary (Last 24 hours) at 01/14/16 1001 Last data filed at 01/14/16 0858  Gross per 24 hour  Intake   1040 ml  Output   4960 ml  Net  -3920 ml   Physical exam: General appearance: alert, cooperative and no distress Lungs: clear to auscultation bilaterally Heart: regularly irregular rhythm, systolic murmur: holosystolic 3/6, at 2nd right intercostal space radiates to carotids and diastolic murmur: 1/6,XW 2nd left intercostal space, radiates to aortic area and apex Abdomen: soft, non-tender; bowel sounds normal; no masses, mild hepatomegaly Extremities:pitting lower extremity edema extending to just below the right knee  Lab Results: Basic Metabolic Panel:  Last Labs      Recent Labs Lab 01/13/16 0218 01/14/16 0322  NA 136 137  K 3.8 4.6  CL 98* 97*  CO2 28 30  GLUCOSE 93 91  BUN 20 18  CREATININE 1.01* 0.87  CALCIUM 8.3* 8.7*     Liver Function Tests:  Last Labs      Recent Labs Lab 01/08/16 1002 01/09/16 0028  AST 49* 62*  ALT 39 49  ALKPHOS 82 71  BILITOT 1.6* 1.4*  PROT 6.0* 5.8*  ALBUMIN 2.8* 2.7*     CBC:  Last Labs      Recent Labs Lab 01/08/16 0750  01/13/16 0218 01/14/16 0322  WBC  11.0* < > 6.9 7.1  NEUTROABS 8.4* --  --  --   HGB 14.7 < > 11.3* 11.8*  HCT 44.0 < > 34.3* 36.0  MCV 93.6 < > 91.0 94.0  PLT 309 < > 184 206  < > = values in this interval not displayed.        Micro Results: Recent Results (from the past 240 hour(s))  C difficile quick scan w PCR reflex     Status: Abnormal   Collection Time: 01/08/16  5:08 PM  Result Value Ref Range Status   C Diff antigen POSITIVE (A) NEGATIVE Final   C Diff toxin NEGATIVE NEGATIVE Final   C Diff interpretation   Final    C. difficile present, but toxin not detected. This indicates colonization. In most cases, this does not require treatment. If patient has signs and symptoms consistent with colitis, consider treatment. Requires ENTERIC precautions.  Clostridium Difficile by PCR     Status: None   Collection Time: 01/08/16  5:08 PM  Result Value Ref Range Status   Toxigenic C Difficile by pcr NEGATIVE NEGATIVE Final  MRSA PCR Screening     Status: None   Collection Time: 01/08/16  9:48 PM  Result Value Ref Range Status   MRSA by PCR NEGATIVE NEGATIVE Final  Comment:        The GeneXpert MRSA Assay (FDA approved for NASAL specimens only), is one component of a comprehensive MRSA colonization surveillance program. It is not intended to diagnose MRSA infection nor to guide or monitor treatment for MRSA infections.   Urine culture     Status: Abnormal   Collection Time: 01/11/16  9:05 PM  Result Value Ref Range Status   Specimen Description URINE, CLEAN CATCH  Final   Special Requests NONE  Final   Culture MULTIPLE SPECIES PRESENT, SUGGEST RECOLLECTION (A)  Final   Report Status 01/13/2016 FINAL  Final   Studies/Results: Ct Coronary Morp W/cta Cor W/score W/ca W/cm &/or Wo/cm  01/12/2016  ADDENDUM REPORT: 01/12/2016 16:54 CLINICAL DATA:  45 year old female with severe aortic regurgitation and subaortic membrane. Evaluate for other possible congenital heart  anomalies. EXAM: Cardiac/Coronary  CT TECHNIQUE: The patient was scanned on a Philips 256 scanner. FINDINGS: A 120 kV prospective scan was triggered in the descending thoracic aorta at 111 HU's. Axial non-contrast 3 mm slices were carried out through the heart. The data set was analyzed on a dedicated work station and scored using the Agatson method. Gantry rotation speed was 270 msecs and collimation was .9 mm. No beta blockade or NTG were given. The 3D data set was reconstructed in 5% intervals of the 67-82 % of the R-R cycle. Diastolic phases were analyzed on a dedicated work station using MPR, MIP and VRT modes. The patient received 80 cc of contrast. Aorta: Normal size of the aortic root (32 x 31 x 29 mm) and visualized portion of the ascending aorta. Aortic Valve: Trileaflet, mildly calcified, focal calcification of the left coronary cusp. There is a subaortic membrane present in the LVOT located 6 mm bellow the non-coronary cusp. Coronary Arteries: Normal origin. Left dominance. No evidence of CAD. Dilated pulmonary artery measuring 35 mm consistent with pulmonary hypertension. There are 4 pulmonary veins that are draining normally into the left atrium. There is no anomalous pulmonary vein drainage. No ASD or VSD is seen. IMPRESSION: 1. Normal size of the aortic root and visualized portion of the ascending aorta. 2. Mildly thickened and calcified trileaflet aortic valve with leaflet non-coaptation and subaortic membrane located 6 mm bellow the non-coronary cusp. 3.  No ASD or VSD seen.  Normal pulmonary vein drainage in the LVOT. 4.  Dilated pulmonary artery consistent with pulmonary hypertension. 5.  Normal coronary origin with left dominance.  No CAD. Tobias Alexander Electronically Signed   By: Tobias Alexander   On: 01/12/2016 16:54  01/12/2016  EXAM: OVER-READ INTERPRETATION  CT CHEST The following report is an over-read performed by radiologist Dr. Irma Newness [facility] on 01/12/2016.  This over-read does not include interpretation of cardiac or coronary anatomy or pathology. The coronary CTA interpretation by the cardiologist is attached. COMPARISON:  Chest radiographs today and 01/08/2016. FINDINGS: Mediastinum/Nodes: No enlarged mediastinal or hilar lymph nodes are identified. Cardiovascular findings are dictated separately. Lungs/Pleura: There are moderate-sized pleural effusions, larger on the right. There is associated dependent atelectasis at both lung bases. In addition, there are patchy ground-glass opacities in both lungs, probably a combination of edema and atelectasis. Upper abdomen: There is prominent reflux of contrast into the IVC and hepatic veins. The hepatic veins appear mildly dilated. Musculoskeletal: Generalized soft tissue edema suspicious for anasarca. No focal osseous abnormalities are seen. IMPRESSION: 1. Cardiomegaly, pulmonary edema and bilateral pleural effusions consistent with congestive heart failure. 2. Associated atelectasis at both lung  bases. 3. Cardiovascular findings dictated separately Electronically Signed: By: Carey Bullocks M.D. On: 01/12/2016 14:15   Medications: I have reviewed the patient's current medications. Scheduled Meds: . amiodarone  200 mg Oral BID  . chlorhexidine  15 mL Mouth/Throat BID  . furosemide  40 mg Oral Daily  . pneumococcal 23 valent vaccine  0.5 mL Intramuscular Tomorrow-1000  . rivaroxaban  20 mg Oral Daily  . sodium chloride flush  3 mL Intravenous Q12H   Continuous Infusions:  PRN Meds:.sodium chloride, acetaminophen, ondansetron (ZOFRAN) IV, sodium chloride flush, sodium chloride flush  Assessment/Plan: Ms. Mendell is a 45 yo female with a afib with a history of congenital heart disease, being seen for an episode of afib and acute congestive heart failure exacerbation.   1. Afib with RVR adn acute HFrEF exacerbation: Patient is in sinus rhythm and reports orthopnea but denies palpitations. Left and right heart  catheritization showed severe PAH  -will continue to diurese with IV lasix in AM and switch to PO this afternoon -continue with cardiology/CVTS recommendations  -rate and rhythm currently controlled on oral amiodarone -anticoag switched from IV heparin to xarelto  PO daily  2. Obstructive Lung Disease: PFTs showed FEV1/FVC of 83. FEV1 33, FV 32 -continue following with CT recs  3. C. Diff colitis: Lab results showed antigen positive, toxin negative stool. No recurrent diarrhea or abdominal pain -symptoms have resolved, PO vanc was discontinued. Will continue to monitor clinically  This is a Psychologist, occupational Note.  The care of the patient was discussed with Dr. Isabella Bowens and the assessment and plan formulated with their assistance.  Please see their attached note for official documentation of the daily encounter.   LOS: 6 days   [place], Med Student 01/14/2016, 10:01 AM

## 2016-01-14 NOTE — Progress Notes (Signed)
Subjective: No acute events overnight. Doing well this morning. Dyspnea improved. Denies CP.  Objective: Vital signs in last 24 hours: Filed Vitals:   01/13/16 0533 01/13/16 1404 01/13/16 2000 01/14/16 0500  BP: 145/69 139/88 121/68 143/68  Pulse: 65 73 76 64  Temp: 97.8 F (36.6 C) 98.6 F (37 C) 98.9 F (37.2 C) 98.2 F (36.8 C)  TempSrc: Oral Oral Oral   Resp: 18 18 18 18   Height: 5\' 3"  (1.6 m)     Weight: 109 lb 14.4 oz (49.85 kg)   104 lb 3.2 oz (47.265 kg)  SpO2: 93% 94% 94% 97%   Weight change: -5 lb 11.2 oz (-2.586 kg)  Intake/Output Summary (Last 24 hours) at 01/14/16 0720 Last data filed at 01/14/16 0053  Gross per 24 hour  Intake   1280 ml  Output   6110 ml  Net  -4830 ml   General Apperance: NAD HEENT: Normocephalic, atraumatic, anicteric sclera Neck: Supple, trachea midline Lungs: Clear to auscultation bilaterally. No wheezes, rhonchi or rales. Breathing comfortably Heart: Regular rate and rhythm Abdomen: Soft, nontender, nondistended, no rebound/guarding Extremities: Warm and well perfused, +BLE edema to midshins Skin: No rashes or lesions Neurologic: Alert and interactive. No gross deficits.  Lab Results: Basic Metabolic Panel:  Recent Labs Lab 01/13/16 0218 01/14/16 0322  NA 136 137  K 3.8 4.6  CL 98* 97*  CO2 28 30  GLUCOSE 93 91  BUN 20 18  CREATININE 1.01* 0.87  CALCIUM 8.3* 8.7*   Liver Function Tests:  Recent Labs Lab 01/08/16 1002 01/09/16 0028  AST 49* 62*  ALT 39 49  ALKPHOS 82 71  BILITOT 1.6* 1.4*  PROT 6.0* 5.8*  ALBUMIN 2.8* 2.7*   CBC:  Recent Labs Lab 01/08/16 0750  01/13/16 0218 01/14/16 0322  WBC 11.0*  < > 6.9 7.1  NEUTROABS 8.4*  --   --   --   HGB 14.7  < > 11.3* 11.8*  HCT 44.0  < > 34.3* 36.0  MCV 93.6  < > 91.0 94.0  PLT 309  < > 184 206  < > = values in this interval not displayed.   Micro Results: Recent Results (from the past 240 hour(s))  C difficile quick scan w PCR reflex     Status:  Abnormal   Collection Time: 01/08/16  5:08 PM  Result Value Ref Range Status   C Diff antigen POSITIVE (A) NEGATIVE Final   C Diff toxin NEGATIVE NEGATIVE Final   C Diff interpretation   Final    C. difficile present, but toxin not detected. This indicates colonization. In most cases, this does not require treatment. If patient has signs and symptoms consistent with colitis, consider treatment. Requires ENTERIC precautions.  Clostridium Difficile by PCR     Status: None   Collection Time: 01/08/16  5:08 PM  Result Value Ref Range Status   Toxigenic C Difficile by pcr NEGATIVE NEGATIVE Final  MRSA PCR Screening     Status: None   Collection Time: 01/08/16  9:48 PM  Result Value Ref Range Status   MRSA by PCR NEGATIVE NEGATIVE Final    Comment:        The GeneXpert MRSA Assay (FDA approved for NASAL specimens only), is one component of a comprehensive MRSA colonization surveillance program. It is not intended to diagnose MRSA infection nor to guide or monitor treatment for MRSA infections.   Urine culture     Status: Abnormal   Collection Time:  01/11/16  9:05 PM  Result Value Ref Range Status   Specimen Description URINE, CLEAN CATCH  Final   Special Requests NONE  Final   Culture MULTIPLE SPECIES PRESENT, SUGGEST RECOLLECTION (A)  Final   Report Status 01/13/2016 FINAL  Final   Studies/Results: Dg Orthopantogram  01/12/2016  CLINICAL DATA:  Poor dentition, cardiac valve abnormality EXAM: ORTHOPANTOGRAM/PANORAMIC COMPARISON:  None. FINDINGS: The Panorex view of the mandible is very suboptimal with the mandibular angles not able to be assessed. Repeat Panorex view is recommended. The condylar heads appear to be in normal position. No acute fracture is seen. Probable caries are noted involving the lower right first molar, the right upper premolar with dental roots remaining in the left lower second molar region with probable adjacent cystic abnormality possibly a small dentigerous  cyst. There may also be caries involvement of the left upper third molar. IMPRESSION: 1. Probable dental caries as described above. 2. Suboptimal assessment of the mandibular angles on this Panorex view. Consider repeat if warranted clinically. Electronically Signed   By: Dwyane Dee M.D.   On: 01/12/2016 08:22   Dg Chest 2 View  01/12/2016  CLINICAL DATA:  Shortness of breath, cough, CHF EXAM: CHEST  2 VIEW COMPARISON:  Portable chest x-ray of 01/08/2016 and 12/22/2015 FINDINGS: There are more prominent interstitial markings with small effusions and cardiomegaly, most consistent with interstitial edema. No focal pneumonia is seen. The bones are osteopenic. IMPRESSION: Interstitial edema with cardiomegaly and small effusions. Electronically Signed   By: Dwyane Dee M.D.   On: 01/12/2016 08:18   Ct Coronary Morp W/cta Cor W/score W/ca W/cm &/or Wo/cm  01/12/2016  ADDENDUM REPORT: 01/12/2016 16:54 CLINICAL DATA:  45 year old female with severe aortic regurgitation and subaortic membrane. Evaluate for other possible congenital heart anomalies. EXAM: Cardiac/Coronary  CT TECHNIQUE: The patient was scanned on a Philips 256 scanner. FINDINGS: A 120 kV prospective scan was triggered in the descending thoracic aorta at 111 HU's. Axial non-contrast 3 mm slices were carried out through the heart. The data set was analyzed on a dedicated work station and scored using the Agatson method. Gantry rotation speed was 270 msecs and collimation was .9 mm. No beta blockade or NTG were given. The 3D data set was reconstructed in 5% intervals of the 67-82 % of the R-R cycle. Diastolic phases were analyzed on a dedicated work station using MPR, MIP and VRT modes. The patient received 80 cc of contrast. Aorta: Normal size of the aortic root (32 x 31 x 29 mm) and visualized portion of the ascending aorta. Aortic Valve: Trileaflet, mildly calcified, focal calcification of the left coronary cusp. There is a subaortic membrane present in  the LVOT located 6 mm bellow the non-coronary cusp. Coronary Arteries: Normal origin. Left dominance. No evidence of CAD. Dilated pulmonary artery measuring 35 mm consistent with pulmonary hypertension. There are 4 pulmonary veins that are draining normally into the left atrium. There is no anomalous pulmonary vein drainage. No ASD or VSD is seen. IMPRESSION: 1. Normal size of the aortic root and visualized portion of the ascending aorta. 2. Mildly thickened and calcified trileaflet aortic valve with leaflet non-coaptation and subaortic membrane located 6 mm bellow the non-coronary cusp. 3.  No ASD or VSD seen.  Normal pulmonary vein drainage in the LVOT. 4.  Dilated pulmonary artery consistent with pulmonary hypertension. 5.  Normal coronary origin with left dominance.  No CAD. Tobias Alexander Electronically Signed   By: Tobias Alexander   On:  01/12/2016 16:54  01/12/2016  EXAM: OVER-READ INTERPRETATION  CT CHEST The following report is an over-read performed by radiologist Dr. Irma Newness [facility] on 01/12/2016. This over-read does not include interpretation of cardiac or coronary anatomy or pathology. The coronary CTA interpretation by the cardiologist is attached. COMPARISON:  Chest radiographs today and 01/08/2016. FINDINGS: Mediastinum/Nodes: No enlarged mediastinal or hilar lymph nodes are identified. Cardiovascular findings are dictated separately. Lungs/Pleura: There are moderate-sized pleural effusions, larger on the right. There is associated dependent atelectasis at both lung bases. In addition, there are patchy ground-glass opacities in both lungs, probably a combination of edema and atelectasis. Upper abdomen: There is prominent reflux of contrast into the IVC and hepatic veins. The hepatic veins appear mildly dilated. Musculoskeletal: Generalized soft tissue edema suspicious for anasarca. No focal osseous abnormalities are seen. IMPRESSION: 1. Cardiomegaly, pulmonary edema and  bilateral pleural effusions consistent with congestive heart failure. 2. Associated atelectasis at both lung bases. 3. Cardiovascular findings dictated separately Electronically Signed: By: Carey Bullocks M.D. On: 01/12/2016 14:15   Medications: I have reviewed the patient's current medications. Scheduled Meds: . amiodarone  200 mg Oral BID  . chlorhexidine  15 mL Mouth/Throat BID  . pneumococcal 23 valent vaccine  0.5 mL Intramuscular Tomorrow-1000  . rivaroxaban  20 mg Oral Daily  . sodium chloride flush  3 mL Intravenous Q12H   Continuous Infusions:  PRN Meds:.sodium chloride, acetaminophen, ondansetron (ZOFRAN) IV, sodium chloride flush, sodium chloride flush Assessment/Plan: 45 year old woman with history of aortic stenosis and aortic insufficiency, congenital heart disease recently hospitalized 2/23 to 3/2 for CAP found to be in atrial flutter s/p cardioversion presenting with vomiting, diarrhea and palpitations.  Acute on chronic HFrEF: LV EF 40-45% on TEE 11/09/2015. Received Lasix  IV x 2 yesterday. 6.1L UOP yesterday and net negative 3.2 L since admission. Wt 104 from 109 yesterday. -Cardiology following, appreciate recommendations -Lasix  IV once this morning and start Lasix  PO daily this afternoon -Daily weights and strict i/o  Atrial fibrillation with RVR: CHADs-VASc of 2 (gender, CHF). Converted to NSR on amiodarone. HR 64-76 -Amiodarone  BID -Xarelto  daily  Severe Pulmonary Hypertension, Aortic valvular disease:  -CVTS following, appreciate recommendations -Needs high risk surgical intervention. She will follow up with Dr. Cornelius Moras in 3-4 weeks as an outpatient. -Preop doppler  Obstructive and Restrictive Lung Disease: FEV1/FVC 83. FEV1 33 and FVC 32. No Bronchodilator response. DLCOcor 68 but corrects for alveolar volume.   VTE ppx: Xarelto Code status: FULL  Dispo: Disposition is deferred at this time, awaiting improvement of current medical  problems.  Anticipated discharge in approximately 0-1 day(s).   The patient does have a current PCP (Dawn Marland Mcalpine, MD) and does not need an [facility] hospital follow-up appointment after discharge.  The patient does not have transportation limitations that hinder transportation to clinic appointments.  .Services Needed at time of discharge: Y = Yes, Blank = No PT:   OT:   RN:   Equipment:   Other:     LOS: 6 days   Lora Paula, MD 01/14/2016, 7:20 AM

## 2016-01-14 NOTE — Progress Notes (Signed)
Cardiac Rehab Phase I  Pre-operative cardiac surgery teaching.  Give pre-op surgery booklet, discussed what to expect over the course of her hospitalization.  Surgery for AS/AI in the next month.  Patient states she has anxiety when it involves hospitals and doctors, hopefully the information will decrease her anxiety.  She is already ambulating in the halls independently without difficulty.  1430-1500

## 2016-01-15 ENCOUNTER — Inpatient Hospital Stay (HOSPITAL_COMMUNITY): Payer: MEDICAID

## 2016-01-15 DIAGNOSIS — I2510 Atherosclerotic heart disease of native coronary artery without angina pectoris: Secondary | ICD-10-CM

## 2016-01-15 LAB — CBC
HEMATOCRIT: 36.3 % (ref 36.0–46.0)
Hemoglobin: 11.5 g/dL — ABNORMAL LOW (ref 12.0–15.0)
MCH: 29.3 pg (ref 26.0–34.0)
MCHC: 31.7 g/dL (ref 30.0–36.0)
MCV: 92.4 fL (ref 78.0–100.0)
PLATELETS: 185 10*3/uL (ref 150–400)
RBC: 3.93 MIL/uL (ref 3.87–5.11)
RDW: 16.5 % — AB (ref 11.5–15.5)
WBC: 6.9 10*3/uL (ref 4.0–10.5)

## 2016-01-15 MED ORDER — AMIODARONE HCL 200 MG PO TABS: 200.0000 mg | ORAL_TABLET | Freq: Two times a day (BID) | ORAL | Status: DC

## 2016-01-15 MED ORDER — FUROSEMIDE 40 MG PO TABS: 40.0000 mg | ORAL_TABLET | Freq: Every day | ORAL | Status: DC

## 2016-01-15 NOTE — Progress Notes (Signed)
Pre-op Cardiac Surgery  Carotid Findings:  No significant stenosis. Vertebral artery flow is antegrade  Upper Extremity Right Left  Brachial Pressures 133T Restricted  T  Radial Waveforms T T  Ulnar Waveforms T T  Palmar Arch (Allen's Test) WNL WNL   Findings:      Lower  Extremity Right Left  Dorsalis Pedis    Anterior Tibial T T  Posterior Tibial T T  Ankle/Brachial Indices      Findings:  Palpable

## 2016-01-15 NOTE — Progress Notes (Signed)
Subjective: Ms. Frappier had no acute events overnight. She says she is feeling great and is ready to go home. She has no SOB, chest pain, palpitations or diarrhea.  Objective: Vital signs in last 24 hours: Filed Vitals:   01/14/16 0500 01/14/16 1415 01/14/16 2039 01/15/16 0454  BP: 143/68 110/78 143/73 128/52  Pulse: 64 72 77 70  Temp: 98.2 F (36.8 C) 98.5 F (36.9 C) 98.4 F (36.9 C) 98.7 F (37.1 C)  TempSrc:  Oral  Oral  Resp: 18 18 19 20   Height:      Weight: 47.265 kg (104 lb 3.2 oz)   44.271 kg (97 lb 9.6 oz)  SpO2: 97% 95% 96% 95%   Weight change: -2.994 kg (-6 lb 9.6 oz)  Intake/Output Summary (Last 24 hours) at 01/15/16 1008 Last data filed at 01/15/16 0454  Gross per 24 hour  Intake   1900 ml  Output   2000 ml  Net   -100 ml   Physical exam: General appearance: alert, cooperative and no distress Lungs: clear to auscultation bilaterally Heart: regularly irregular rhythm, systolic murmur: holosystolic 3/6, at 2nd right intercostal space radiates to carotids and diastolic murmur: 4/8,JE 2nd left intercostal space, radiates to aortic area and apex Abdomen: soft, non-tender; bowel sounds normal; no masses Extremities: no pitting edema  Micro Results: Recent Results (from the past 240 hour(s))  C difficile quick scan w PCR reflex     Status: Abnormal   Collection Time: 01/08/16  5:08 PM  Result Value Ref Range Status   C Diff antigen POSITIVE (A) NEGATIVE Final   C Diff toxin NEGATIVE NEGATIVE Final   C Diff interpretation   Final    C. difficile present, but toxin not detected. This indicates colonization. In most cases, this does not require treatment. If patient has signs and symptoms consistent with colitis, consider treatment. Requires ENTERIC precautions.  Clostridium Difficile by PCR     Status: None   Collection Time: 01/08/16  5:08 PM  Result Value Ref Range Status   Toxigenic C Difficile by pcr NEGATIVE NEGATIVE Final  MRSA PCR Screening     Status:  None   Collection Time: 01/08/16  9:48 PM  Result Value Ref Range Status   MRSA by PCR NEGATIVE NEGATIVE Final    Comment:        The GeneXpert MRSA Assay (FDA approved for NASAL specimens only), is one component of a comprehensive MRSA colonization surveillance program. It is not intended to diagnose MRSA infection nor to guide or monitor treatment for MRSA infections.   Urine culture     Status: Abnormal   Collection Time: 01/11/16  9:05 PM  Result Value Ref Range Status   Specimen Description URINE, CLEAN CATCH  Final   Special Requests NONE  Final   Culture MULTIPLE SPECIES PRESENT, SUGGEST RECOLLECTION (A)  Final   Report Status 01/13/2016 FINAL  Final   Studies/Results: No results found. Medications: I have reviewed the patient's current medications. Scheduled Meds: . amiodarone  200 mg Oral BID  . chlorhexidine  15 mL Mouth/Throat BID  . furosemide  40 mg Oral Daily  . pneumococcal 23 valent vaccine  0.5 mL Intramuscular Tomorrow-1000  . rivaroxaban  20 mg Oral Daily  . sodium chloride flush  3 mL Intravenous Q12H   Continuous Infusions:  PRN Meds:.sodium chloride, acetaminophen, ondansetron (ZOFRAN) IV, sodium chloride flush, sodium chloride flush  Assessment/Plan: Ms. Prime is a 45 yo female with a afib with a history of  congenital heart disease, being seen for an episode of afib and acute congestive heart failure exacerbation.   1. Afib with RVR adn acute HFrEF exacerbation: Patient is in sinus rhythm and reports orthopnea but denies palpitations. Left and right heart catheritization showed severe PAH. Fluid retention has improved significantly and patient has no symptoms related to fluid overload. -continue home lasix dose   -continue oral amiodarone -continue xarelto  PO daily -follow up with cardiology/CVTS recommendations  2. Obstructive Lung Disease: PFTs showed FEV1/FVC of 83. FEV1 33, FV 32 -continue following with CT recs  3. C. Diff colitis:  Lab results showed antigen positive, toxin negative stool. No recurrent diarrhea or abdominal pain  This is a Psychologist, occupational Note.  The care of the patient was discussed with Dr. Isabella Bowens and the assessment and plan formulated with their assistance.  Please see their attached note for official documentation of the daily encounter.   LOS: 7 days   [place] Carbo, Med Student 01/15/2016, 10:08 AM

## 2016-01-15 NOTE — Discharge Summary (Signed)
Name: Sheila Branch MRN: [id number] DOB: Apr 07, 1971 45 y.o. PCP: Pete Glatter, MD  Date of Admission: 01/08/2016  7:38 AM Date of Discharge: 01/16/2016 Attending Physician: No att. providers found  Discharge Diagnosis: Principal Problem:   Acute on chronic systolic heart failure (HCC) Active Problems:   Aortic valve disease   Atrial fibrillation with rapid ventricular response (HCC)   Diarrhea  Discharge Medications:   Medication List    STOP taking these medications        metoprolol succinate 100 MG 24 hr tablet  Commonly known as:  TOPROL XL     triamcinolone 0.1 % paste  Commonly known as:  KENALOG      TAKE these medications        amiodarone 200 MG tablet  Commonly known as:  PACERONE  Take 1 tablet (200 mg total) by mouth 2 (two) times daily.     furosemide 40 MG tablet  Commonly known as:  LASIX  Take 1 tablet (40 mg total) by mouth daily.     rivaroxaban 20 MG Tabs tablet  Commonly known as:  XARELTO  Take 1 tablet (20 mg total) by mouth daily with supper.        Disposition and follow-up:   Ms.Sheila Branch was discharged from [facility] in Stable condition.  At the hospital follow up visit please address:  1.  Is patient still having resolution of CV/respiratory symptoms? Is patient tolerating furosemide?  2.  Labs / imaging needed at time of follow-up: BMP (Cr and K recheck)  3.  Pending labs/ test needing follow-up: None  Follow-up Appointments: Follow-up Information    Follow up with [facility] On 01/19/2016.   Why:  [facility] appointment on 01/19/16 at 10:30 am with Dr. Venetia Night.   Contact information:   [address] [place] [address] [phone]      Follow up with Purcell Nails, MD. Schedule an appointment as soon as possible for a visit in 2 weeks.   Specialty:  Cardiothoracic Surgery   Why:  To discuss surgery   Contact information:   [address] [place] [address] [phone]       Schedule an appointment as soon as possible for a visit with Dental Clinic.   Why:  For preoperative dental evaluation      Discharge Instructions: Discharge Instructions    Call MD for:  difficulty breathing, headache or visual disturbances    Complete by:  As directed      Call MD for:  extreme fatigue    Complete by:  As directed      Call MD for:  persistant dizziness or light-headedness    Complete by:  As directed      Call MD for:  persistant nausea and vomiting    Complete by:  As directed      Call MD for:  severe uncontrolled pain    Complete by:  As directed      Call MD for:  temperature >100.4    Complete by:  As directed      Diet - low sodium heart healthy    Complete by:  As directed      Increase activity slowly    Complete by:  As directed            Consultations: Treatment Team:  Purcell Nails, MD  Procedures Performed:  Dg Leone Brand  01/12/2016  CLINICAL DATA:  Poor dentition, cardiac valve abnormality EXAM: ORTHOPANTOGRAM/PANORAMIC COMPARISON:  None. FINDINGS: The Panorex view of the mandible is very suboptimal with the mandibular angles not able to be assessed. Repeat Panorex view is recommended. The condylar heads appear to be in normal position. No acute fracture is seen. Probable caries are noted involving the lower right first molar, the right upper premolar with dental roots remaining in the left lower second molar region with probable adjacent cystic abnormality possibly a small dentigerous cyst. There may also be caries involvement of the left upper third molar. IMPRESSION: 1. Probable dental caries as described above. 2. Suboptimal assessment of the mandibular angles on this Panorex view. Consider repeat if warranted clinically. Electronically Signed   By: Dwyane Dee M.D.   On: 01/12/2016 08:22   Dg Chest 2 View  01/12/2016  CLINICAL DATA:  Shortness of breath, cough, CHF  EXAM: CHEST  2 VIEW COMPARISON:  Portable chest x-ray of 01/08/2016 and 12/22/2015 FINDINGS: There are more prominent interstitial markings with small effusions and cardiomegaly, most consistent with interstitial edema. No focal pneumonia is seen. The bones are osteopenic. IMPRESSION: Interstitial edema with cardiomegaly and small effusions. Electronically Signed   By: Dwyane Dee M.D.   On: 01/12/2016 08:18   Dg Chest 2 View  12/22/2015  CLINICAL DATA:  Shortness of breath.  Recent pneumonia. EXAM: CHEST  2 VIEW COMPARISON:  Radiographs 11/03/2015 and 07/25/2011 FINDINGS: Chronic cardiac enlargement. Mild tortuosity of the thoracic aorta. The pulmonary hila are prominent. There is chronic lung disease. Findings could be due to sarcoidosis. Recommend clinical correlation. There is cephalization blood flow which can be seen with early CHF. No overt pulmonary edema or pleural effusions. The bony thorax is intact. IMPRESSION: Chronic cardiac enlargement. Prominent pulmonary hila and chronic lung disease. Sarcoidosis would be a possibility. Findings could also be due to chronic heart disease and chronic CHF. Cephalization of pulmonary blood flow but no overt pulmonary edema or pleural effusions. Electronically Signed   By: Rudie Meyer M.D.   On: 12/22/2015 16:49   Ct Coronary Morp W/cta Cor W/score W/ca W/cm &/or Wo/cm  01/12/2016  ADDENDUM REPORT: 01/12/2016 16:54 CLINICAL DATA:  45 year old female with severe aortic regurgitation and subaortic membrane. Evaluate for other possible congenital heart anomalies. EXAM: Cardiac/Coronary  CT TECHNIQUE: The patient was scanned on a Philips 256 scanner. FINDINGS: A 120 kV prospective scan was triggered in the descending thoracic aorta at 111 HU's. Axial non-contrast 3 mm slices were carried out through the heart. The data set was analyzed on a dedicated work station and scored using the Agatson method. Gantry rotation speed was 270 msecs and collimation was .9 mm. No  beta blockade or NTG were given. The 3D data set was reconstructed in 5% intervals of the 67-82 % of the R-R cycle. Diastolic phases were analyzed on a dedicated work station using MPR, MIP and VRT modes. The patient received 80 cc of contrast. Aorta: Normal size of the aortic root (32 x 31 x 29 mm) and visualized portion of the ascending aorta. Aortic Valve: Trileaflet, mildly calcified, focal calcification of the left coronary cusp. There is a subaortic membrane present in the LVOT located 6 mm bellow the non-coronary cusp. Coronary Arteries: Normal origin. Left dominance. No evidence of CAD. Dilated pulmonary artery measuring 35 mm consistent with pulmonary hypertension. There are 4 pulmonary veins that are draining normally into the left atrium. There is no anomalous pulmonary vein drainage. No ASD or VSD  is seen. IMPRESSION: 1. Normal size of the aortic root and visualized portion of the ascending aorta. 2. Mildly thickened and calcified trileaflet aortic valve with leaflet non-coaptation and subaortic membrane located 6 mm bellow the non-coronary cusp. 3.  No ASD or VSD seen.  Normal pulmonary vein drainage in the LVOT. 4.  Dilated pulmonary artery consistent with pulmonary hypertension. 5.  Normal coronary origin with left dominance.  No CAD. Tobias Alexander Electronically Signed   By: Tobias Alexander   On: 01/12/2016 16:54  01/12/2016  EXAM: OVER-READ INTERPRETATION  CT CHEST The following report is an over-read performed by radiologist Dr. Irma Newness [facility] on 01/12/2016. This over-read does not include interpretation of cardiac or coronary anatomy or pathology. The coronary CTA interpretation by the cardiologist is attached. COMPARISON:  Chest radiographs today and 01/08/2016. FINDINGS: Mediastinum/Nodes: No enlarged mediastinal or hilar lymph nodes are identified. Cardiovascular findings are dictated separately. Lungs/Pleura: There are moderate-sized pleural effusions, larger on the  right. There is associated dependent atelectasis at both lung bases. In addition, there are patchy ground-glass opacities in both lungs, probably a combination of edema and atelectasis. Upper abdomen: There is prominent reflux of contrast into the IVC and hepatic veins. The hepatic veins appear mildly dilated. Musculoskeletal: Generalized soft tissue edema suspicious for anasarca. No focal osseous abnormalities are seen. IMPRESSION: 1. Cardiomegaly, pulmonary edema and bilateral pleural effusions consistent with congestive heart failure. 2. Associated atelectasis at both lung bases. 3. Cardiovascular findings dictated separately Electronically Signed: By: Carey Bullocks M.D. On: 01/12/2016 14:15   Dg Chest Port 1 View  01/08/2016  CLINICAL DATA:  Atrial fibrillation.  Short of breath. EXAM: PORTABLE CHEST 1 VIEW COMPARISON:  12/22/2015 FINDINGS: Severe cardiomegaly. Hyperaeration. Vascular congestion is present but improved. Small right pleural effusion. No pneumothorax. IMPRESSION: Cardiomegaly and vascular congestion are present but vascular congestion has mildly improved. Small right pleural effusion has developed. Electronically Signed   By: Jolaine Click M.D.   On: 01/08/2016 08:09    2D Echo: None  Cardiac Cath:  Coronary Findings    Dominance: Left   Left Anterior Descending  . Vessel is large. Vessel is angiographically normal.   . First Diagonal Branch   The vessel is large in size.   Marland Kitchen Second Diagonal Branch   The vessel is moderate in size.     Left Circumflex  . Vessel is large. Vessel is angiographically normal.   . First Obtuse Marginal Branch   The vessel is small in size.   . Third Obtuse Marginal Branch   The vessel is large in size.   Marland Kitchen Fourth Obtuse Marginal Branch   The vessel is small in size.     Right Coronary Artery  . Vessel is moderate in size. Vessel is angiographically normal.       Right Heart Pressures Hemodynamic findings consistent with severe  pulmonary hypertension. Elevated LV EDP consistent with volume overload.   Admission HPI: Ms. Sroka is a 45 yo female with afib, h/o congenital heart disease, now with EF 40-45%, severe AS, and PAH, and recent admission for CAP, presenting with one day h/o feeling like her "heart is on fire." This is the same sensation she had when she was first diagnosed with afib. She states her heart is burning, which then causes her body to burn, and is a/w SOB, hot flashes, and palpitations. For the last two weeks, she has had nonproductive cough, 4 lb weight gain, worsening 3 pillow orthopnea, and LE  edema L>R. She quit smoking 4 months ago (previously 23 years x 1ppd). She usually takes her PRN Lasix 20 mg every other day, but has been taking it daily for the last week at the direction of her doctor. She has been compliant with her Xarelto and Metoprolol. She has "tried" to adhere to low salt diet, and has been trying to drink lots of fluids (2-3 bottles of water, 1 ginger ale). She denies coffee or other caffeine intake. Her stress level is currently OK, but her anxiety has recently been increased surrounding a scheduled heart cath.  Two weeks ago, she also had multiple episodes of diarrhea per day. Her family member also had diarrhea at that time. It lasted 4-5 days before resolving. However, it returned Thursday. She reports loose, now watery stools, with and at least 3 episodes daily as well as squirts a/w coughing. She denies blood or melena. She denies current sick contacts, abdominal pain, or antibiotics. Of note, she was discharged 3/2 from admission for CAP, at which time received 5 day course of Levaquin.  She was recently diagnosed with afib/aflutter in Feb requiring cardioversion for rhythm control. TTE/TEE demonstrated EF 40-45%, diffuse hypokinesis, severe AS with subvalvular calcifications and severe AR, severe LA dilation, and PA pressure 50 mmHg. She was scheduled for Myoview stress  test, which was cancelled due to her diarrhea. She was rescheduled for cath, which was also cancelled due to recurrent diarrhea. She has a h/o CHD, reported as "atrial valves" and murmurs, requiring multiple surgeries as a child (in [place], [place]). 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 She does not have records.   In the ED, patient was found to be in afib with RVR. IV Metoprolol and Diltiazem were unable to provide rate control. Cardiology consult transitioned patient to IV Amiodarone with probable cardioversion tomorrow. Her BNP was elevated to 748.  Hospital Course by problem list: Principal Problem:   Acute on chronic combined systolic and diastolic heart failure (HCC) Active Problems:   Aortic valve disease   Chronic combined systolic and diastolic heart failure (HCC)   Chest pain   Atrial fibrillation with rapid ventricular response (HCC)   Diarrhea   Cough   Atrial fibrillation (HCC)   Persistent atrial fibrillation (HCC)   Aortic insufficiency   Subvalvar aortic stenosis   Chronic combined systolic and diastolic congestive heart failure (HCC)   1. Atrial fibrillation with RVR and acute HFrEF exacerbation - Ms. Arizola presented with a two day history of burning chest pain, SOB, hot flashes and palpitations. She arrived at [facility] ED where she was found to be in afib with RVR. IV metoprolol and diltiazem were given but unable to provide rate control. Cardiology was consulted and she was transitioned to IV amiodarone and schedule for heart catheterization. BNP was elevated to 748. On hospital day 1, IV heparin 700u/hr and Lasix 20mg  IV daily were started, in addition to IV amiodarone. At that time the patient was feeling much better. She denied SOB, chest pain or palpitations. Following an initial dose of Lasix, there was a slight elevation in her SCr/ BUN from 0.89/24 to 1.37/31 and the Lasix was held for a few days. Volume overload persisted as evidenced by bilateral swollen lower  extremities. On hospital day 3 the patient underwent left and right heart catheterization with coronary angiography. The heart catheterization revealed severe PAH and moderate volume overload (WP 17, LVEDP 22). Angiography showed no anomalous pulmonary veins or coronary pathology. Careful volume diuresis with  Lasix 40 IV BID was reinitiated at that time and she was switched to oral amiodarone 200mg  BID. On hospital day 7, the patient was transitioned from IV heparin to Xarelto 20mg  daily. She continued to have bilateral lower extremity swelling and mild orthopnea. Over the next day, careful diuresis was continued and resulted in resolution of edema and SOB. On day of discharge, the patient had no complaints of SOB, chest pain, palpitations or lower extremity edema.  2. Congenital heart disease - The patient presented with previously determined congenital heart disease. At the time of admission it was unknown the exact etiology of her heart defect. During heart catheterization with angiography performed on hospital day 3, patient was found to have aortic valve stenosis due to a subaortic valvular membrane with concurrent aortic valve regurgitation. It was determined that the patient would require aortic valve replacement to be performed at a later date. The patient then underwent pre-op PFTs on hospital day 4 which revealed mixed restrictive and obstructive lung disease, FEV1/FVC 83. FEV1 33 and FVC 32. On day of discharge, the patient was counseled by cardiac rehab and outpatient follow up with CVTS was scheduled.  3. C. diff colitis - At presentation, the patient had reported two weeks of multiple episodes of diarrhea daily. At the time she endorsed episodes of loose, watery diarrhea as well as squirts with coughing. She was started on PO vancomycine and C. diff stool studies were sent. The results were antigen positive, toxin negative for C. diff. Following a course of PO vancomycin, the patient reported  resolution of symptoms, denying diarrhea or abdominal pain. She was monitored throughout her hospital stay for recurrence of symptoms, but she remained asymptomatic.  Discharge Vitals:   BP 128/52 mmHg  Pulse 70  Temp(Src) 98.7 F (37.1 C) (Oral)  Resp 20  Ht 5\' 3"  (1.6 m)  Wt 97 lb 9.6 oz (44.271 kg)  BMI 17.29 kg/m2  SpO2 95%  LMP 12/12/2015  Discharge Labs:  No results found for this or any previous visit (from the past 24 hour(s)).  Signed: Darrick Huntsman, MD 01/16/2016, 7:27 AM    Services Ordered on Discharge: None Equipment Ordered on Discharge: None

## 2016-01-15 NOTE — Progress Notes (Signed)
Subjective: No acute events overnight. Doing well this morning. Dyspnea and orthopnea improved. Denies CP.  Objective: Vital signs in last 24 hours: Filed Vitals:   01/14/16 0500 01/14/16 1415 01/14/16 2039 01/15/16 0454  BP: 143/68 110/78 143/73 128/52  Pulse: 64 72 77 70  Temp: 98.2 F (36.8 C) 98.5 F (36.9 C) 98.4 F (36.9 C) 98.7 F (37.1 C)  TempSrc:  Oral  Oral  Resp: Height:      Weight: 104 lb 3.2 oz (47.265 kg)   97 lb 9.6 oz (44.271 kg)  SpO2: 97% 95% 96% 95%   Weight change: -6 lb 9.6 oz (-2.994 kg)  Intake/Output Summary (Last 24 hours) at 01/15/16 1032 Last data filed at 01/15/16 0454  Gross per 24 hour  Intake   1900 ml  Output   2000 ml  Net   -100 ml   General Apperance: NAD HEENT: Normocephalic, atraumatic, anicteric sclera Neck: Supple, trachea midline Lungs: Clear to auscultation bilaterally. No wheezes, rhonchi or rales. Breathing comfortably Heart: Regular rate and rhythm Abdomen: Soft, nontender, nondistended, no rebound/guarding Extremities: Warm and well perfused, no edema Skin: No rashes or lesions Neurologic: Alert and interactive. No gross deficits.  Lab Results: Basic Metabolic Panel:  Recent Labs Lab 01/13/16 0218 01/14/16 0322  NA 136 137  K 3.8 4.6  CL 98* 97*  CO2 28 30  GLUCOSE 93 91  BUN 20 18  CREATININE 1.01* 0.87  CALCIUM 8.3* 8.7*   Liver Function Tests:  Recent Labs Lab 01/09/16 0028  AST 62*  ALT 49  ALKPHOS 71  BILITOT 1.4*  PROT 5.8*  ALBUMIN 2.7*   CBC:  Recent Labs Lab 01/14/16 0322 01/15/16 0430  WBC 7.1 6.9  HGB 11.8* 11.5*  HCT 36.0 36.3  MCV 94.0 92.4  PLT 206 185     Micro Results: Recent Results (from the past 240 hour(s))  C difficile quick scan w PCR reflex     Status: Abnormal   Collection Time: 01/08/16  5:08 PM  Result Value Ref Range Status   C Diff antigen POSITIVE (A) NEGATIVE Final   C Diff toxin NEGATIVE NEGATIVE Final   C Diff interpretation   Final    C. difficile present, but toxin not detected. This indicates colonization. In most cases, this does not require treatment. If patient has signs and symptoms consistent with colitis, consider treatment. Requires ENTERIC precautions.  Clostridium Difficile by PCR     Status: None   Collection Time: 01/08/16  5:08 PM  Result Value Ref Range Status   Toxigenic C Difficile by pcr NEGATIVE NEGATIVE Final  MRSA PCR Screening     Status: None   Collection Time: 01/08/16  9:48 PM  Result Value Ref Range Status   MRSA by PCR NEGATIVE NEGATIVE Final    Comment:        The GeneXpert MRSA Assay (FDA approved for NASAL specimens only), is one component of a comprehensive MRSA colonization surveillance program. It is not intended to diagnose MRSA infection nor to guide or monitor treatment for MRSA infections.   Urine culture     Status: Abnormal   Collection Time: 01/11/16  9:05 PM  Result Value Ref Range Status   Specimen Description URINE, CLEAN CATCH  Final   Special Requests NONE  Final   Culture MULTIPLE SPECIES PRESENT, SUGGEST RECOLLECTION (A)  Final   Report Status 01/13/2016 FINAL  Final   Studies/Results: No results found. Medications: I have  reviewed the patient's current medications. Scheduled Meds: . amiodarone  200 mg Oral BID  . chlorhexidine  15 mL Mouth/Throat BID  . furosemide  40 mg Oral Daily  . pneumococcal 23 valent vaccine  0.5 mL Intramuscular Tomorrow-1000  . rivaroxaban  20 mg Oral Daily  . sodium chloride flush  3 mL Intravenous Q12H   Continuous Infusions:  PRN Meds:.sodium chloride, acetaminophen, ondansetron (ZOFRAN) IV, sodium chloride flush, sodium chloride flush Assessment/Plan: 45 year old woman with history of aortic stenosis and aortic insufficiency, congenital heart disease recently hospitalized 2/23 to 3/2 for CAP found to be in atrial flutter s/p cardioversion presenting with vomiting, diarrhea and palpitations.  Acute on chronic HFrEF: LV EF  40-45% on TEE 11/09/2015. Received Lasix 40mg  IV x 1 and 40mg  PO x 1 yesterday. 2L UOP yesterday and net negative 3 L since admission. Wt 97 from 104 yesterday. -Cardiology following, appreciate recommendations -Lasix 40mg  PO daily  Atrial fibrillation with RVR: CHADs-VASc of 2 (gender, CHF). Converted to NSR on amiodarone. HR 70s -Amiodarone 200mg  BID -Xarelto 20mg  daily  Severe Pulmonary Hypertension, Aortic valvular disease:  -CVTS following, appreciate recommendations -Needs high risk surgical intervention. She will follow up with Dr. Cornelius Moras in 3-4 weeks as an outpatient. -Preop doppler  Obstructive and Restrictive Lung Disease: FEV1/FVC 83. FEV1 33 and FVC 32. No Bronchodilator response. DLCOcor 68 but corrects for alveolar volume.   VTE ppx: Xarelto Code status: FULL  Dispo: Likely home today  The patient does have a current PCP (Dawn Marland Mcalpine, MD) and does not need an [facility] hospital follow-up appointment after discharge.  The patient does not have transportation limitations that hinder transportation to clinic appointments.  .Services Needed at time of discharge: Y = Yes, Blank = No PT:   OT:   RN:   Equipment:   Other:     LOS: 7 days   Lora Paula, MD 01/15/2016, 10:32 AM

## 2016-01-15 NOTE — Discharge Instructions (Signed)
New medications:  Take Amiodarone twice a day  Modified medications:  Take Lasix 40mg  once a day Continue to take your Xarelto  Stop taking metoprolol.  Follow up with your primary care clinic on 5/11 at 10:30 AM for hospital follow up Follow up with dentistry as previously instructed Follow up with Dr. Barry Dienes, cardiothoracic surgery, in 2-3 weeks.   Low-Sodium Eating Plan Sodium raises blood pressure and causes water to be held in the body. Getting less sodium from food will help lower your blood pressure, reduce any swelling, and protect your heart, liver, and kidneys. We get sodium by adding salt (sodium chloride) to food. Most of our sodium comes from canned, boxed, and frozen foods. Restaurant foods, fast foods, and pizza are also very high in sodium. Even if you take medicine to lower your blood pressure or to reduce fluid in your body, getting less sodium from your food is important. WHAT IS MY PLAN? Most people should limit their sodium intake to 2,300 mg a day. Your health care provider recommends that you limit your sodium intake to __________ a day.  WHAT DO I NEED TO KNOW ABOUT THIS EATING PLAN? For the low-sodium eating plan, you will follow these general guidelines:  Choose foods with a % Daily Value for sodium of less than 5% (as listed on the food label).   Use salt-free seasonings or herbs instead of table salt or sea salt.   Check with your health care provider or pharmacist before using salt substitutes.   Eat fresh foods.  Eat more vegetables and fruits.  Limit canned vegetables. If you do use them, rinse them well to decrease the sodium.   Limit cheese to 1 oz (28 g) per day.   Eat lower-sodium products, often labeled as "lower sodium" or "no salt added."  Avoid foods that contain monosodium glutamate (MSG). MSG is sometimes added to [place] food and some canned foods.  Check food labels (Nutrition Facts labels) on foods to learn how much sodium is  in one serving.  Eat more home-cooked food and less restaurant, buffet, and fast food.  When eating at a restaurant, ask that your food be prepared with less salt, or no salt if possible.  HOW DO I READ FOOD LABELS FOR SODIUM INFORMATION? The Nutrition Facts label lists the amount of sodium in one serving of the food. If you eat more than one serving, you must multiply the listed amount of sodium by the number of servings. Food labels may also identify foods as:  Sodium free--Less than 5 mg in a serving.  Very low sodium--35 mg or less in a serving.  Low sodium--140 mg or less in a serving.  Light in sodium--50% less sodium in a serving. For example, if a food that usually has 300 mg of sodium is changed to become light in sodium, it will have 150 mg of sodium.  Reduced sodium--25% less sodium in a serving. For example, if a food that usually has 400 mg of sodium is changed to reduced sodium, it will have 300 mg of sodium. WHAT FOODS CAN I EAT? Grains Low-sodium cereals, including oats, puffed wheat and rice, and shredded wheat cereals. Low-sodium crackers. Unsalted rice and pasta. Lower-sodium bread.  Vegetables Frozen or fresh vegetables. Low-sodium or reduced-sodium canned vegetables. Low-sodium or reduced-sodium tomato sauce and paste. Low-sodium or reduced-sodium tomato and vegetable juices.  Fruits Fresh, frozen, and canned fruit. Fruit juice.  Meat and Other Protein Products Low-sodium canned tuna and salmon. Fresh  or frozen meat, poultry, seafood, and fish. Lamb. Unsalted nuts. Dried beans, peas, and lentils without added salt. Unsalted canned beans. Homemade soups without salt. Eggs.  Dairy Milk. Soy milk. Ricotta cheese. Low-sodium or reduced-sodium cheeses. Yogurt.  Condiments Fresh and dried herbs and spices. Salt-free seasonings. Onion and garlic powders. Low-sodium varieties of mustard and ketchup. Fresh or refrigerated horseradish. Lemon juice.  Fats and  Oils Reduced-sodium salad dressings. Unsalted butter.  Other Unsalted popcorn and pretzels.  The items listed above may not be a complete list of recommended foods or beverages. Contact your dietitian for more options. WHAT FOODS ARE NOT RECOMMENDED? Grains Instant hot cereals. Bread stuffing, pancake, and biscuit mixes. Croutons. Seasoned rice or pasta mixes. Noodle soup cups. Boxed or frozen macaroni and cheese. Self-rising flour. Regular salted crackers. Vegetables Regular canned vegetables. Regular canned tomato sauce and paste. Regular tomato and vegetable juices. Frozen vegetables in sauces. Salted [place] fries. Olives. Rosita Fire. Relishes. Sauerkraut. Salsa. Meat and Other Protein Products Salted, canned, smoked, spiced, or pickled meats, seafood, or fish. Bacon, ham, sausage, hot dogs, corned beef, chipped beef, and packaged luncheon meats. Salt pork. Jerky. Pickled herring. Anchovies, regular canned tuna, and sardines. Salted nuts. Dairy Processed cheese and cheese spreads. Cheese curds. Blue cheese and cottage cheese. Buttermilk.  Condiments Onion and garlic salt, seasoned salt, table salt, and sea salt. Canned and packaged gravies. Worcestershire sauce. Tartar sauce. Barbecue sauce. Teriyaki sauce. Soy sauce, including reduced sodium. Steak sauce. Fish sauce. Oyster sauce. Cocktail sauce. Horseradish that you find on the shelf. Regular ketchup and mustard. Meat flavorings and tenderizers. Bouillon cubes. Hot sauce. Tabasco sauce. Marinades. Taco seasonings. Relishes. Fats and Oils Regular salad dressings. Salted butter. Margarine. Ghee. Bacon fat.  Other Potato and tortilla chips. Corn chips and puffs. Salted popcorn and pretzels. Canned or dried soups. Pizza. Frozen entrees and pot pies.  The items listed above may not be a complete list of foods and beverages to avoid. Contact your dietitian for more information.   This information is not intended to replace advice given  to you by your health care provider. Make sure you discuss any questions you have with your health care provider.   Document Released: 02/16/2002 Document Revised: 09/17/2014 Document Reviewed: 07/01/2013 Elsevier Interactive Patient Education 2016 Elsevier Inc.  Heart Failure Heart failure means your heart has trouble pumping blood. This makes it hard for your body to work well. Heart failure is usually a long-term (chronic) condition. You must take good care of yourself and follow your doctor's treatment plan. HOME CARE  Take your heart medicine as told by your doctor.  Do not stop taking medicine unless your doctor tells you to.  Do not skip any dose of medicine.  Refill your medicines before they run out.  Take other medicines only as told by your doctor or pharmacist.  Stay active if told by your doctor. The elderly and people with severe heart failure should talk with a doctor about physical activity.  Eat heart-healthy foods. Choose foods that are without trans fat and are low in saturated fat, cholesterol, and salt (sodium). This includes fresh or frozen fruits and vegetables, fish, lean meats, fat-free or low-fat dairy foods, whole grains, and high-fiber foods. Lentils and dried peas and beans (legumes) are also good choices.  Limit salt if told by your doctor.  Cook in a healthy way. Roast, grill, broil, bake, poach, steam, or stir-fry foods.  Limit fluids as told by your doctor.  Weigh yourself every morning. Do  this after you pee (urinate) and before you eat breakfast. Write down your weight to give to your doctor.  Take your blood pressure and write it down if your doctor tells you to.  Ask your doctor how to check your pulse. Check your pulse as told.  Lose weight if told by your doctor.  Stop smoking or chewing tobacco. Do not use gum or patches that help you quit without your doctor's approval.  Schedule and go to doctor visits as told.  Nonpregnant women  should have no more than 1 drink a day. Men should have no more than 2 drinks a day. Talk to your doctor about drinking alcohol.  Stop illegal drug use.  Stay current with shots (immunizations).  Manage your health conditions as told by your doctor.  Learn to manage your stress.  Rest when you are tired.  If it is really hot outside:  Avoid intense activities.  Use air conditioning or fans, or get in a cooler place.  Avoid caffeine and alcohol.  Wear loose-fitting, lightweight, and light-colored clothing.  If it is really cold outside:  Avoid intense activities.  Layer your clothing.  Wear mittens or gloves, a hat, and a scarf when going outside.  Avoid alcohol.  Learn about heart failure and get support as needed.  Get help to maintain or improve your quality of life and your ability to care for yourself as needed. GET HELP IF:   You gain weight quickly.  You are more short of breath than usual.  You cannot do your normal activities.  You tire easily.  You cough more than normal, especially with activity.  You have any or more puffiness (swelling) in areas such as your hands, feet, ankles, or belly (abdomen).  You cannot sleep because it is hard to breathe.  You feel like your heart is beating fast (palpitations).  You get dizzy or light-headed when you stand up. GET HELP RIGHT AWAY IF:   You have trouble breathing.  There is a change in mental status, such as becoming less alert or not being able to focus.  You have chest pain or discomfort.  You faint. MAKE SURE YOU:   Understand these instructions.  Will watch your condition.  Will get help right away if you are not doing well or get worse.   This information is not intended to replace advice given to you by your health care provider. Make sure you discuss any questions you have with your health care provider.   Document Released: 06/05/2008 Document Revised: 09/17/2014 Document Reviewed:  10/13/2012 Elsevier Interactive Patient Education 2016 Elsevier Inc.   Aortic Valve Replacement  Aortic valve replacement is a procedure to replace an aortic valve that cannot be repaired. An artificial (prosthetic) valve is used to do this. Three types of prosthetic valves are available:   Mechanical valves made entirely from man-made materials.   Donor valves made from human donors. These are only used in special situations.   Biological valves made from animal tissues.  The type of prosthetic valve used will be determined based on various factors, including your age, your lifestyle, and other medical conditions you have.  LET [facility] CARE PROVIDER KNOW ABOUT:  Any allergies you have.  All medicines you are taking, including vitamins, herbs, eye drops, creams, and over-the-counter medicines.  Previous problems you or members of your family have had with the use of anesthetics.  Any blood disorders you have.  Previous surgeries you have had.  Medical conditions you have. RISKS AND COMPLICATIONS Generally, this is a safe procedure. However, as with any procedure, problems can occur. Possible problems include:   Blood clotting caused by the new valve. Replacement with a mechanical valve requires lifelong treatment with medicine to prevent blood clots.   Infection in the new valve.   Valve failure.   Bleeding.   Reaction to anesthetics.  BEFORE THE PROCEDURE  Ask your health care provider about:  Changing or stopping your regular medicines. This is especially important if you are taking diabetes medicines or blood thinners.  Taking medicines such as aspirin and ibuprofen. These medicines can thin your blood. Do not take these medicines before your procedure if your health care provider asks you not to.  Do not eat or drink anything after midnight on the night before the procedure or as directed by your health care provider. PROCEDURE  The surgeon may use  either an open technique or a minimally invasive technique for this surgery:  Traditional open surgery  You will be given a medicine that makes you fall asleep (general anesthetic).  You will then be placed on a heart-lung bypass machine. This machine provides oxygen to your blood while the heart is undergoing surgery.  During surgery, the surgeon will make a large cut (incision) in the chest.  The heart will be cooled to slow or stop the heartbeat.  The damaged aortic valve will be removed and replaced with a prosthetic heart valve.  The incision will then be closed with staples or stitches. Minimally invasive surgery This is done through a smaller incision. This still requires general anesthetic and the heart-lung bypass machine. Your heart will be cooled to slow or stop the heartbeat, allowing the damaged valve to be removed and replaced with the new valve. The smaller incision will then be closed. If your condition allows for this procedure, there is often less blood loss, less pain, and faster recovery compared to traditional open surgery.  AFTER THE PROCEDURE You will be monitored closely in a recovery area. From there, you will likely go to an intensive care unit.    This information is not intended to replace advice given to you by your health care provider. Make sure you discuss any questions you have with your health care provider.   Document Released: 01/16/2005 Document Revised: 09/17/2014 Document Reviewed: 06/10/2012 Elsevier Interactive Patient Education [employer]

## 2016-01-15 NOTE — Progress Notes (Signed)
Order received to remove left upper arm midline iv per Dr. Isabella Bowens.

## 2016-01-15 NOTE — Progress Notes (Signed)
Patient ID: JAME SEELIG, female   DOB: 1970-11-01, 45 y.o.   MRN: [id number]    Patient Name: Sheila Branch Date of Encounter: 01/15/2016     Principal Problem:   Acute on chronic combined systolic and diastolic heart failure (HCC) Active Problems:   Aortic valve disease   Chronic combined systolic and diastolic heart failure (HCC)   Chest pain   Atrial fibrillation with rapid ventricular response (HCC)   Diarrhea   Cough   Atrial fibrillation (HCC)   Persistent atrial fibrillation (HCC)   Aortic insufficiency   Subvalvar aortic stenosis   Chronic combined systolic and diastolic congestive heart failure (HCC)    SUBJECTIVE  No chest pain or sob.   CURRENT MEDS . amiodarone  200 mg Oral BID  . chlorhexidine  15 mL Mouth/Throat BID  . furosemide  40 mg Oral Daily  . pneumococcal 23 valent vaccine  0.5 mL Intramuscular Tomorrow-1000  . rivaroxaban  20 mg Oral Daily  . sodium chloride flush  3 mL Intravenous Q12H    OBJECTIVE  Filed Vitals:   01/14/16 0500 01/14/16 1415 01/14/16 2039 01/15/16 0454  BP: 143/68 110/78 143/73 128/52  Pulse: 64 72 77 70  Temp: 98.2 F (36.8 C) 98.5 F (36.9 C) 98.4 F (36.9 C) 98.7 F (37.1 C)  TempSrc:  Oral  Oral  Resp: Height:      Weight: 104 lb 3.2 oz (47.265 kg)   97 lb 9.6 oz (44.271 kg)  SpO2: 97% 95% 96% 95%    Intake/Output Summary (Last 24 hours) at 01/15/16 1020 Last data filed at 01/15/16 0454  Gross per 24 hour  Intake   1900 ml  Output   2000 ml  Net   -100 ml   Filed Weights   01/13/16 0533 01/14/16 0500 01/15/16 0454  Weight: 109 lb 14.4 oz (49.85 kg) 104 lb 3.2 oz (47.265 kg) 97 lb 9.6 oz (44.271 kg)    PHYSICAL EXAM  General: Pleasant, NAD. Neuro: Alert and oriented X 3. Moves all extremities spontaneously. Psych: Normal affect. HEENT:  Normal  Neck: Supple without bruits or JVD. Lungs:  Resp regular and unlabored, CTA. Heart: RRR with 3/6 systolic and 2/6 diastolic murmur of AS  and AI. Abdomen: Soft, non-tender, non-distended, BS + x 4.  Extremities: No clubbing, cyanosis or edema. DP/PT/Radials 2+ and equal bilaterally.  Accessory Clinical Findings  CBC  Recent Labs  01/14/16 0322 01/15/16 0430  WBC 7.1 6.9  HGB 11.8* 11.5*  HCT 36.0 36.3  MCV 94.0 92.4  PLT 206 185   Basic Metabolic Panel  Recent Labs  01/13/16 0218 01/14/16 0322  NA 136 137  K 3.8 4.6  CL 98* 97*  CO2 28 30  GLUCOSE 93 91  BUN 20 18  CREATININE 1.01* 0.87  CALCIUM 8.3* 8.7*   Liver Function Tests No results for input(s): AST, ALT, ALKPHOS, BILITOT, PROT, ALBUMIN in the last 72 hours. No results for input(s): LIPASE, AMYLASE in the last 72 hours. Cardiac Enzymes No results for input(s): CKTOTAL, CKMB, CKMBINDEX, TROPONINI in the last 72 hours. BNP Invalid input(s): POCBNP D-Dimer No results for input(s): DDIMER in the last 72 hours. Hemoglobin A1C No results for input(s): HGBA1C in the last 72 hours. Fasting Lipid Panel No results for input(s): CHOL, HDL, LDLCALC, TRIG, CHOLHDL, LDLDIRECT in the last 72 hours. Thyroid Function Tests No results for input(s): TSH, T4TOTAL, T3FREE, THYROIDAB in the last 72 hours.  Invalid input(s):  FREET3  TELE  nsr  Radiology/Studies  Dg Orthopantogram  01/12/2016  CLINICAL DATA:  Poor dentition, cardiac valve abnormality EXAM: ORTHOPANTOGRAM/PANORAMIC COMPARISON:  None. FINDINGS: The Panorex view of the mandible is very suboptimal with the mandibular angles not able to be assessed. Repeat Panorex view is recommended. The condylar heads appear to be in normal position. No acute fracture is seen. Probable caries are noted involving the lower right first molar, the right upper premolar with dental roots remaining in the left lower second molar region with probable adjacent cystic abnormality possibly a small dentigerous cyst. There may also be caries involvement of the left upper third molar. IMPRESSION: 1. Probable dental caries as  described above. 2. Suboptimal assessment of the mandibular angles on this Panorex view. Consider repeat if warranted clinically. Electronically Signed   By: Dwyane Dee M.D.   On: 01/12/2016 08:22   Dg Chest 2 View  01/12/2016  CLINICAL DATA:  Shortness of breath, cough, CHF EXAM: CHEST  2 VIEW COMPARISON:  Portable chest x-ray of 01/08/2016 and 12/22/2015 FINDINGS: There are more prominent interstitial markings with small effusions and cardiomegaly, most consistent with interstitial edema. No focal pneumonia is seen. The bones are osteopenic. IMPRESSION: Interstitial edema with cardiomegaly and small effusions. Electronically Signed   By: Dwyane Dee M.D.   On: 01/12/2016 08:18   Dg Chest 2 View  12/22/2015  CLINICAL DATA:  Shortness of breath.  Recent pneumonia. EXAM: CHEST  2 VIEW COMPARISON:  Radiographs 11/03/2015 and 07/25/2011 FINDINGS: Chronic cardiac enlargement. Mild tortuosity of the thoracic aorta. The pulmonary hila are prominent. There is chronic lung disease. Findings could be due to sarcoidosis. Recommend clinical correlation. There is cephalization blood flow which can be seen with early CHF. No overt pulmonary edema or pleural effusions. The bony thorax is intact. IMPRESSION: Chronic cardiac enlargement. Prominent pulmonary hila and chronic lung disease. Sarcoidosis would be a possibility. Findings could also be due to chronic heart disease and chronic CHF. Cephalization of pulmonary blood flow but no overt pulmonary edema or pleural effusions. Electronically Signed   By: Rudie Meyer M.D.   On: 12/22/2015 16:49   Ct Coronary Morp W/cta Cor W/score W/ca W/cm &/or Wo/cm  01/12/2016  ADDENDUM REPORT: 01/12/2016 16:54 CLINICAL DATA:  45 year old female with severe aortic regurgitation and subaortic membrane. Evaluate for other possible congenital heart anomalies. EXAM: Cardiac/Coronary  CT TECHNIQUE: The patient was scanned on a Philips 256 scanner. FINDINGS: A 120 kV prospective scan was  triggered in the descending thoracic aorta at 111 HU's. Axial non-contrast 3 mm slices were carried out through the heart. The data set was analyzed on a dedicated work station and scored using the Agatson method. Gantry rotation speed was 270 msecs and collimation was .9 mm. No beta blockade or NTG were given. The 3D data set was reconstructed in 5% intervals of the 67-82 % of the R-R cycle. Diastolic phases were analyzed on a dedicated work station using MPR, MIP and VRT modes. The patient received 80 cc of contrast. Aorta: Normal size of the aortic root (32 x 31 x 29 mm) and visualized portion of the ascending aorta. Aortic Valve: Trileaflet, mildly calcified, focal calcification of the left coronary cusp. There is a subaortic membrane present in the LVOT located 6 mm bellow the non-coronary cusp. Coronary Arteries: Normal origin. Left dominance. No evidence of CAD. Dilated pulmonary artery measuring 35 mm consistent with pulmonary hypertension. There are 4 pulmonary veins that are draining normally into the left atrium.  There is no anomalous pulmonary vein drainage. No ASD or VSD is seen. IMPRESSION: 1. Normal size of the aortic root and visualized portion of the ascending aorta. 2. Mildly thickened and calcified trileaflet aortic valve with leaflet non-coaptation and subaortic membrane located 6 mm bellow the non-coronary cusp. 3.  No ASD or VSD seen.  Normal pulmonary vein drainage in the LVOT. 4.  Dilated pulmonary artery consistent with pulmonary hypertension. 5.  Normal coronary origin with left dominance.  No CAD. Tobias Alexander Electronically Signed   By: Tobias Alexander   On: 01/12/2016 16:54  01/12/2016  EXAM: OVER-READ INTERPRETATION  CT CHEST The following report is an over-read performed by radiologist Dr. Irma Newness [facility] on 01/12/2016. This over-read does not include interpretation of cardiac or coronary anatomy or pathology. The coronary CTA interpretation by the  cardiologist is attached. COMPARISON:  Chest radiographs today and 01/08/2016. FINDINGS: Mediastinum/Nodes: No enlarged mediastinal or hilar lymph nodes are identified. Cardiovascular findings are dictated separately. Lungs/Pleura: There are moderate-sized pleural effusions, larger on the right. There is associated dependent atelectasis at both lung bases. In addition, there are patchy ground-glass opacities in both lungs, probably a combination of edema and atelectasis. Upper abdomen: There is prominent reflux of contrast into the IVC and hepatic veins. The hepatic veins appear mildly dilated. Musculoskeletal: Generalized soft tissue edema suspicious for anasarca. No focal osseous abnormalities are seen. IMPRESSION: 1. Cardiomegaly, pulmonary edema and bilateral pleural effusions consistent with congestive heart failure. 2. Associated atelectasis at both lung bases. 3. Cardiovascular findings dictated separately Electronically Signed: By: Carey Bullocks M.D. On: 01/12/2016 14:15   Dg Chest Port 1 View  01/08/2016  CLINICAL DATA:  Atrial fibrillation.  Short of breath. EXAM: PORTABLE CHEST 1 VIEW COMPARISON:  12/22/2015 FINDINGS: Severe cardiomegaly. Hyperaeration. Vascular congestion is present but improved. Small right pleural effusion. No pneumothorax. IMPRESSION: Cardiomegaly and vascular congestion are present but vascular congestion has mildly improved. Small right pleural effusion has developed. Electronically Signed   By: Jolaine Click M.D.   On: 01/08/2016 08:09    ASSESSMENT AND PLAN  1. 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 She will follow with Dr. Cornelius Moras 2. Acute on chronic systolic and diastolic heart failure - she has been diuresed and is now euvolemic. Would send home on 40 mg daily although she might need more prior to surgery. She is encouraged to maintain a low sodium diet. 3. Tobacco abuse - in remission. 4. COPD - no wheezing on exam today. She has stopped smoking. 5. Disp. - from my  perspective, she is stable for dc.  Eulogio Requena,M.D.  01/15/2016 10:20 AM

## 2016-01-16 ENCOUNTER — Telehealth: Payer: Self-pay

## 2016-01-16 NOTE — Telephone Encounter (Signed)
[facility] Post-discharge Follow-Up Phone Call:  Date of Discharge: 01/16/2016 Principal Discharge Diagnosis(es): acute on chronic systolic heart failure, aortic valve disease, atrial fibrillation with RVR. Post-discharge Communication: call placed to the patient Call Completed: Yes                  With Whom: patient Interpreter Needed: No     Please check all that apply:  X  Patient is knowledgeable of his/her condition(s) and/or treatment. X  Patient is caring for self at home. She said that her brother stay with her and sleeps on her couch. He can provide assistance if needed. She noted that she has sufficient food at home and he daughter did food shopping priro to her hospitalization.  ? Patient is receiving assist at home from family and/or caregiver. Family and/or caregiver is knowledgeable of patient's condition(s) and/or treatment. ? Patient is receiving home health services. If so, name of agency.     Medication Reconciliation:  X  Medication list reviewed with patient. - She understands the use, dose and frequency of each medication. X  Patient obtained all discharge medications. If not, why? Yes she had them filled at [facility]. She was given only a partial prescription for amiodarone and said that she will pick up the rest of the prescription later this week.     Activities of Daily Living:  X   Independent - She said that she does not need any DME but a friend has a walker and wheelchair available for her if needed.  ? Needs assist (describe; ? home DME used) ? [facility] (describe, ? home DME used)   Community resources in place for patient:  X  None  ? Home Health/Home DME ? Assisted Living ? Support Group          Patient Education: She said that she has an orange card and was recently informed that she is not eligible for medicaid. She did report that she receives $16/month in food stamps and will soon need to re-certify.  She said that she has  information about local food pantries. She also noted that she is keeping a weight log and is really trying to monitor her sodium intake.  She stated that she has the information from discharge about eating a low sodium diet.        Questions/Concerns discussed:  She said that she is feeling" all right."  She denied any chest pain, shortness of breath or edema. . She stated that last night she was able to lay flat to sleep and elevate her legs. She reported a very positive outlook and was very appreciative of the call. She confirmed her appointment at the [facility] on 01/19/16 @ 1030 and said that she should have transportation.  She stated that she did not have any other questions/ problems at this time.

## 2016-01-17 MED FILL — **XARELTO 20 MG TABLET: 20 MG | 5 days supply | Qty: 5 | Fill #1

## 2016-01-18 ENCOUNTER — Encounter (HOSPITAL_COMMUNITY): Payer: Self-pay | Admitting: Dentistry

## 2016-01-18 ENCOUNTER — Ambulatory Visit (HOSPITAL_COMMUNITY): Payer: Self-pay | Admitting: Dentistry

## 2016-01-18 ENCOUNTER — Telehealth: Payer: Self-pay

## 2016-01-18 VITALS — BP 122/58 | HR 67 | Temp 98.4°F

## 2016-01-18 DIAGNOSIS — M264 Malocclusion, unspecified: Secondary | ICD-10-CM

## 2016-01-18 DIAGNOSIS — K036 Deposits [accretions] on teeth: Secondary | ICD-10-CM | POA: Insufficient documentation

## 2016-01-18 DIAGNOSIS — K0530 Chronic periodontitis, unspecified: Secondary | ICD-10-CM

## 2016-01-18 DIAGNOSIS — Z7901 Long term (current) use of anticoagulants: Secondary | ICD-10-CM

## 2016-01-18 DIAGNOSIS — M2630 Unspecified anomaly of tooth position of fully erupted tooth or teeth: Secondary | ICD-10-CM

## 2016-01-18 DIAGNOSIS — K029 Dental caries, unspecified: Secondary | ICD-10-CM

## 2016-01-18 DIAGNOSIS — IMO0002 Reserved for concepts with insufficient information to code with codable children: Secondary | ICD-10-CM

## 2016-01-18 DIAGNOSIS — K08409 Partial loss of teeth, unspecified cause, unspecified class: Secondary | ICD-10-CM

## 2016-01-18 DIAGNOSIS — K083 Retained dental root: Secondary | ICD-10-CM

## 2016-01-18 DIAGNOSIS — I351 Nonrheumatic aortic (valve) insufficiency: Secondary | ICD-10-CM

## 2016-01-18 DIAGNOSIS — Z01818 Encounter for other preprocedural examination: Secondary | ICD-10-CM

## 2016-01-18 DIAGNOSIS — K045 Chronic apical periodontitis: Secondary | ICD-10-CM

## 2016-01-18 DIAGNOSIS — Z9189 Other specified personal risk factors, not elsewhere classified: Secondary | ICD-10-CM

## 2016-01-18 NOTE — Telephone Encounter (Signed)
This Case Manager received return call from patient. Inquired about patient's status. Patient indicated she was doing "quite well" and that she has "more energy than she has had in months." Patient aware of her upcoming [facility] appointment on 01/19/16 at 1030 and indicated she would be at her appointment. Patient confirmed she had transportation. Reminded patient to bring all medications to her appointment for Dr. Venetia Night to review. Patient verbalized understanding and appreciative of call. No additional needs/concerns identified.

## 2016-01-18 NOTE — Telephone Encounter (Signed)
This Case Manager placed call to patient to check on status and to remind her of upcoming [facility] appointment on 01/19/16 at 1030 with Dr. Venetia Night. Call placed to #[phone]; unable to reach. HIPPA compliant voicemail left requesting return call.

## 2016-01-18 NOTE — Patient Instructions (Signed)
[facility]    Department of Dental Medicine     DR. Lauraann Missey      HEART VALVES AND MOUTH CARE:  FACTS:   If you have any infection in your mouth, it can infect your heart valve.  If you heart valve is infected, you will be seriously ill.  Infections in the mouth can be SILENT and do not always cause pain.  Examples of infections in the mouth are gum disease, dental cavities, and abscesses.  Some possible signs of infection are: Bad breath, bleeding gums, or teeth that are sensitive to sweets, hot, and/or cold. There are many other signs as well.  WHAT YOU HAVE TO DO:   Brush your teeth after meals and at bedtime. Spend at least 2 minutes brushing well, especially behind your back teeth and all around your teeth that stand alone. Brush at the gumline also.  Do not go to bed without brushing your teeth and flossing.  If you gums bleed when you brush or floss, do NOT stop brushing or flossing. It usually means that your gums need more attention and better cleaning.   If your Dentist or Dr. Taniyah Ballow gave you a prescription mouthwash to use, make sure to use it as directed. If you run out of the medication, get a refill at the pharmacy.   If you were given any other medications or directions by your Dentist, please follow them. If you did not understand the directions or forget what you were told, please call. We will be happy to refresh her memory.  If you need antibiotics before dental procedures, make sure you take them one hour prior to every dental visit as directed.   Get a dental checkup every 4-6 months in order to keep your mouth healthy, or to find and treat any new infection. You will most likely need your teeth cleaned or gums treated at the same time.  If you are not able to come in for your scheduled appointment, call your Dentist as soon as possible to reschedule.  If you have a problem in between dental visits, call your Dentist.  

## 2016-01-18 NOTE — Progress Notes (Signed)
DENTAL CONSULTATION  Date of Consultation:  01/18/2016 Patient Name:   Sheila Branch Date of Birth:   August 26, 1971 Medical Record Number: [id number]  VITALS: BP 122/58 mmHg  Pulse 67  Temp(Src) 98.4 F (36.9 C) (Oral)  LMP 12/12/2015  CHIEF COMPLAINT: Patient was referred by Dr. Cornelius Moras for a dental consultation.  HPI: Sheila Branch is a 45 year old female with severe aortic insufficiency. Patient with anticipated aortic valve replacement with Dr. Cornelius Moras. Patient is now seen as part of a medically necessary preaortic valve replacement dental protocol to rule out dental infection that may affect the patient's systemic health and anticipated heart valve surgery.  Patient currently denies acute toothaches, swellings, or abscesses. Patient is not seen a dentist since high school which is approximately 20+ years. Patient did have some discomfort involving the upper left molar approximately 3-4 weeks ago. This was a dull, achy pain that lasted for "a few hours".  Patient indicates that she does not see the dentist since she has a severe dental phobia. Patient indicates that she wants to "be put to sleep" in order to have dental treatment done.   PROBLEM LIST: Patient Active Problem List   Diagnosis Date Noted  . Aortic insufficiency     Priority: Medium  . Subvalvar aortic stenosis   . Chronic combined systolic and diastolic congestive heart failure (HCC)   . Acute on chronic combined systolic and diastolic heart failure (HCC) 01/09/2016  . Diarrhea 01/08/2016  . Cough 01/08/2016  . Atrial fibrillation (HCC) 01/08/2016  . Chest pain 12/26/2015  . Chronic combined systolic and diastolic heart failure (HCC) 11/21/2015  . Tobacco abuse 11/21/2015  . Aortic valve disease   . Atrial flutter with rapid ventricular response (HCC)   . CHD (congenital heart disease)   . Cardiac murmur   . Atrial flutter (HCC) 11/03/2015  . CAP (community acquired pneumonia) 11/03/2015  . Hypokalemia  11/03/2015  . Atrial fibrillation with rapid ventricular response (HCC) 11/03/2015  . Persistent atrial fibrillation (HCC) 11/03/2015    PMH: Past Medical History  Diagnosis Date  . Aortic insufficiency     severe  . Subvalvar aortic stenosis   . Chronic combined systolic and diastolic congestive heart failure (HCC)   . Atrial fibrillation with rapid ventricular response (HCC) 01/08/2016  . Persistent atrial fibrillation (HCC) 11/03/2015  . CHD (congenital heart disease)     PSH: Past Surgical History  Procedure Laterality Date  . Cardiac catheterization  childhood  . Cesarean section    . Rectal surgery    . Tubal ligation    . Cardioversion N/A 11/09/2015    Procedure: CARDIOVERSION;  Surgeon: Lars Masson, MD;  Location: [facility] ENDOSCOPY;  Service: Cardiovascular;  Laterality: N/A;  . Tee without cardioversion N/A 11/09/2015    Procedure: TRANSESOPHAGEAL ECHOCARDIOGRAM (TEE);  Surgeon: Lars Masson, MD;  Location: [facility] ENDOSCOPY;  Service: Cardiovascular;  Laterality: N/A;  . Cardiac catheterization N/A 01/11/2016    Procedure: Right/Left Heart Cath and Coronary Angiography;  Surgeon: Kathleene Hazel, MD;  Location: [facility] INVASIVE CV LAB;  Service: Cardiovascular;  Laterality: N/A;    ALLERGIES: Allergies  Allergen Reactions  . Penicillins Cross Reactors Swelling and Other (See Comments)    Mouth Ulcers Has patient had a PCN reaction causing immediate rash, facial/tongue/throat swelling, SOB or lightheadedness with hypotension: Yes Has patient had a PCN reaction causing severe rash involving mucus membranes or skin necrosis: Yes Has patient had a PCN reaction that required hospitalization No Has patient had  a PCN reaction occurring within the last 10 years: No If all of the above answers are "NO", then may proceed with Cephalosporin use.     MEDICATIONS: Current Outpatient Prescriptions  Medication Sig Dispense Refill  . amiodarone (PACERONE) 200 MG tablet Take 1  tablet (200 mg total) by mouth 2 (two) times daily. 60 tablet 0  . furosemide (LASIX) 40 MG tablet Take 1 tablet (40 mg total) by mouth daily. 30 tablet 0  . rivaroxaban (XARELTO) 20 MG TABS tablet Take 1 tablet (20 mg total) by mouth daily with supper. (Patient taking differently: Take 20 mg by mouth every evening. ) 90 tablet 3   No current facility-administered medications for this visit.    LABS: Lab Results  Component Value Date   WBC 6.9 01/15/2016   HGB 11.5* 01/15/2016   HCT 36.3 01/15/2016   MCV 92.4 01/15/2016   PLT 185 01/15/2016      Component Value Date/Time   NA 137 01/14/2016 0322   K 4.6 01/14/2016 0322   CL 97* 01/14/2016 0322   CO2 30 01/14/2016 0322   GLUCOSE 91 01/14/2016 0322   BUN 18 01/14/2016 0322   CREATININE 0.87 01/14/2016 0322   CREATININE 0.71 01/04/2016 1349   CALCIUM 8.7* 01/14/2016 0322   GFRNONAA >60 01/14/2016 0322   GFRAA >60 01/14/2016 0322   Lab Results  Component Value Date   INR 1.41 01/04/2016   INR 1.14 11/03/2015   No results found for: PTT  SOCIAL HISTORY: Social History   Social History  . Marital Status: Single    Spouse Name: N/A  . Number of Children: N/A  . Years of Education: N/A   Occupational History  . Not on file.   Social History Main Topics  . Smoking status: Former Smoker -- 1.00 packs/day for 23 years    Types: Cigarettes    Quit date: 09/12/2015  . Smokeless tobacco: Never Used  . Alcohol Use: No     Comment: occasionally  . Drug Use: Yes    Special: Marijuana     Comment: occasionally  . Sexual Activity: Not on file   Other Topics Concern  . Not on file   Social History Narrative    FAMILY HISTORY: Family History  Problem Relation Age of Onset  . Heart disease Mother     "i think she died of heart issue"    REVIEW OF SYSTEMS: Reviewed Dr. Orvan July ROS from 01/10/14 with the patient today with changes in bold General:decreased appetite, decreased energy, no weight  gain, no weight loss, no fever Cardiac:+ chest pain with exertion, + chest pain at rest, +SOB with exertion, + resting SOB, + PND, + orthopnea, + palpitations, + arrhythmia, + atrial fibrillation, + LE edema, no dizzy spells, no syncope Respiratory:+ shortness of breath, no home oxygen, + productive cough, no dry cough, + bronchitis, no wheezing, no hemoptysis, no asthma, no pain with inspiration or cough, no sleep apnea, no CPAP at night GI:no difficulty swallowing, no reflux, no frequent heartburn, no hiatal hernia, no abdominal pain, no constipation, no diarrhea, no hematochezia, no hematemesis, no melena GU:no dysuria, no frequency, no urinary tract infection, no hematuria, no kidney stones, no kidney disease Vascular:no pain suggestive of claudication, no pain in feet, no leg cramps, no varicose veins, no DVT, no non-healing foot ulcer Neuro:no stroke, no TIA's, no seizures, no headaches, no temporary blindness one eye, no slurred speech, no peripheral neuropathy, no chronic pain, no instability of gait, no memory/cognitive  dysfunction Musculoskeletal:no arthritis, no joint swelling, no myalgias, no difficulty walking, normal mobility  Skin:no rash, no itching, no skin infections, no pressure sores or ulcerations Psych:no anxiety, no depression, + nervousness, no unusual recent stress, + dental phobia Eyes:+ blurry vision, no floaters, no recent vision changes, does not wear glasses or contacts ENT:no hearing loss, no loose or painful teeth, no dentures, last saw dentist when she was in [facility], this is 20 plus years ago. Hematologic:no easy bruising, no abnormal bleeding, no clotting disorder, no frequent  epistaxis Endocrine:no diabetes, does not check CBG's at home  DENTAL HISTORY: CHIEF COMPLAINT: Patient was referred by Dr. Cornelius Moras for a dental consultation.  HPI: Sheila Branch is a 45 year old female with severe aortic insufficiency. Patient with anticipated aortic valve replacement with Dr. Cornelius Moras. Patient is now seen as part of a medically necessary preaortic valve replacement dental protocol to rule out dental infection that may affect the patient's systemic health and anticipated heart valve surgery.  Patient currently denies acute toothaches, swellings, or abscesses. Patient is not seen a dentist since high school which is approximately 20+ years. Patient did have some discomfort involving the upper left molar approximately 3-4 weeks ago. This was a dull, achy pain that lasted for "a few hours".  Patient indicates that she does not see the dentist since she has a severe dental phobia. Patient indicates that she wants to "be put to sleep" in order to have dental treatment done.  DENTAL EXAMINATION: GENERAL: The patient is a well-developed, slightly built female in no acute distress. HEAD AND NECK: There is no palpable submandibular lymphadenopathy. Patient has left TMJ crepitus, but denies TMJ symptoms. INTRAORAL EXAM: Patient has incipient xerostomia. There is no evidence of oral abscess formation. DENTITION: Patient has multiple missing teeth numbers 1, 3, 13, 14, and 32. There are retained roots in the area of tooth numbers 5, 16, 18, and 31. There are multiple rotated teeth. Patient has lower anterior crowding. PERIODONTAL: Patient has chronic periodontitis with plaque and calculus accumulations, generalized gingival recession, and mandibular anterior incipient tooth mobility. DENTAL CARIES/SUBOPTIMAL RESTORATIONS: There are multiple dental caries noted as per dental charting form. ENDODONTIC: Patient currently denies acute pulpitis symptoms. Patient has periapical  pathology and radiolucency associated with the tooth numbers 5, 16, 18, and 31. CROWN AND BRIDGE: There are no crown and bridge restorations. PROSTHODONTIC: Patient has no partial dentures. OCCLUSION: Patient has a poor occlusal scheme and malocclusion secondary to multiple missing teeth, multiple retained root segments, anterior and posterior crossbite, multiple rotated teeth, and supra-eruption and drifting of the unopposed teeth into the edentulous areas.  RADIOGRAPHIC INTERPRETATION: Orthopantogram was taken and supplemented with a full series of dental radiographs. There are multiple missing teeth. There are multiple retained root segments. There are multiple areas of periapical pathology and radiolucency. There is supra-eruption and drifting of the unopposed teeth into the edentulous areas. There is moderate bone loss. There are multiple dental caries noted.  ASSESSMENTS: 1. Severe aortic insufficiency 2. Persistent atrial fibrillation with chronic anticoagulation 3. Pre-aortic valve replacement dental protocol 4. Risk for bleeding with invasive dental procedures 5. Chronic apical periodontitis 6. Multiple retained root segments 7. Dental caries 8. Multiple rotated teeth 9. Chronic periodontitis with bone loss 10. Tooth mobility 11. Gingival recession 12. Accretions 13. Crowded mandibular anterior teeth 14. Multiple areas of anterior and posterior crossbite 15. Malocclusion 16. Risk for complications up to and including death with anticipated invasive dental procedures in the operating room with general anesthesia  due to overall respiratory and cardiovascular compromise. 17. Severe dental phobia 18. Xerostomia  PLAN/RECOMMENDATIONS: 1. I discussed the risks, benefits, and complications of various treatment options with the patient in relationship to her medical and dental conditions, anticipated aortic valve surgery, and risk for endocarditis. We discussed various treatment  options to include no treatment, multiple extractions with alveoloplasty, pre-prosthetic surgery as indicated, periodontal therapy, dental restorations, root canal therapy, crown and bridge therapy, implant therapy, and replacement of missing teeth as indicated. The patient currently wishes to proceed with multiple extractions with alveoloplasty and gross debridement of remaining dentition in the operating room with general anesthesia. This has been scheduled for Wednesday, 01/25/2016 at 8:30 AM at [facility]. Xarelto therapy will be discontinued 3 days prior to dental extraction procedures per discussion with the cardiologist, Dr. Earney Hamburg. The patient will then follow-up with heart valve surgery with Dr. Cornelius Moras at his discretion. Patient will need to follow-up with a new primary dentist once medically stable from the anticipated aortic valve replacement to continue periodontal maintenance procedures and evaluation for other dental treatment needs as indicated. Patient is aware that she will need antibiotic premedication prior to invasive dental procedures per the American Heart Association guidelines after the heart valve surgery.  2. Discussion of findings with medical team and coordination of future medical and dental care as needed.  I spent in excess of  120 minutes during the conduct of this consultation and >50% of this time involved direct face-to-face encounter for counseling and/or coordination of the patient's care.    Charlynne Pander, DDS

## 2016-01-19 ENCOUNTER — Encounter: Payer: Self-pay | Admitting: Family Medicine

## 2016-01-19 ENCOUNTER — Ambulatory Visit: Payer: Self-pay | Attending: Family Medicine | Admitting: Family Medicine

## 2016-01-19 VITALS — BP 125/68 | HR 69 | Temp 98.1°F | Resp 16 | Ht 63.0 in | Wt 93.0 lb

## 2016-01-19 DIAGNOSIS — Z79899 Other long term (current) drug therapy: Secondary | ICD-10-CM | POA: Insufficient documentation

## 2016-01-19 DIAGNOSIS — I5042 Chronic combined systolic (congestive) and diastolic (congestive) heart failure: Secondary | ICD-10-CM

## 2016-01-19 DIAGNOSIS — Q230 Congenital stenosis of aortic valve: Secondary | ICD-10-CM | POA: Insufficient documentation

## 2016-01-19 DIAGNOSIS — IMO0001 Reserved for inherently not codable concepts without codable children: Secondary | ICD-10-CM

## 2016-01-19 DIAGNOSIS — J449 Chronic obstructive pulmonary disease, unspecified: Secondary | ICD-10-CM

## 2016-01-19 DIAGNOSIS — I509 Heart failure, unspecified: Secondary | ICD-10-CM | POA: Insufficient documentation

## 2016-01-19 DIAGNOSIS — I4892 Unspecified atrial flutter: Secondary | ICD-10-CM

## 2016-01-19 DIAGNOSIS — I351 Nonrheumatic aortic (valve) insufficiency: Secondary | ICD-10-CM

## 2016-01-19 DIAGNOSIS — Q244 Congenital subaortic stenosis: Secondary | ICD-10-CM

## 2016-01-19 MED ORDER — PNEUMOCOCCAL VAC POLYVALENT 25 MCG/0.5ML IJ INJ
0.5000 mL | INJECTION | Freq: Once | INTRAMUSCULAR | Status: AC
  Administered 2016-01-19: 0.5 mL via INTRAMUSCULAR

## 2016-01-19 NOTE — Progress Notes (Signed)
Patient's here for f/up Afib, CHF.  Patient reports feeling good.  Patient denies any pain today.  Patient tolerated injection well

## 2016-01-19 NOTE — Progress Notes (Signed)
Transitional care clinic  Date of telephone encounter: 01/16/16  Admit date: 01/08/16 Discharge date:01/16/16  PCP: Dr  Julien Nordmann  Subjective:  Patient ID: Sheila Branch, female    DOB: 03-08-1971  Age: 45 y.o. MRN: [id number]  CC: Congestive Heart Failure and Atrial Fibrillation   HPI Sheila Branch is a 45 year old female with a history of congenital heart disease, aortic insufficiency, aortic stenosis, chronic combined systolic and diastolic heart failure (EF 40-45%), A. fib, COPD recently hospitalized for acute on chronic systolic and diastolic CHF.  She had presented with palpitations and symptoms of fluid overload and had diarrhea as well. Found to be in A. fib with RVR on presentation which did not respond to IV metoprolol and Diltiazem and so she was switched to IV amiodarone and heparin by cardiology. BNP on presentation was 748 and she was placed on diuretics; she underwent cardiac cath which revealed no angiographic evidence of CAD, aortic valve stenosis due to severe aortic valvular membrane with concurrent aortic valve regurgitation severe pulmonary artery hypertension and moderate volume overload.  She also received vancomycin for protracted diarrhea (stool antigen was positive for C. difficile) Symptoms improved and she was gradually transitioned from IV heparin to Xarelto. 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  She was subsequently discharged To follow-up with cardiothoracic surgery outpatient for aortic valve replacement.  Interval history Scheduled for preop blood work for dental procedures tomorrow. Shortness of breath where she previously had prior to admission has resolved and she no longer has any diarrhea. She has been weighing herself daily and weights have been stable and she is working on a low-sodium diet. Has an appointment with the nutritionist later today.  Outpatient Prescriptions Prior to Visit  Medication Sig Dispense Refill    . amiodarone (PACERONE) 200 MG tablet Take 1 tablet (200 mg total) by mouth 2 (two) times daily. 60 tablet 0  . furosemide (LASIX) 40 MG tablet Take 1 tablet (40 mg total) by mouth daily. 30 tablet 0  . rivaroxaban (XARELTO) 20 MG TABS tablet Take 1 tablet (20 mg total) by mouth daily with supper. (Patient taking differently: Take 20 mg by mouth every evening. ) 90 tablet 3   No facility-administered medications prior to visit.    ROS Review of Systems  Constitutional: Negative for activity change, appetite change and fatigue.  HENT: Negative for congestion, sinus pressure and sore throat.   Eyes: Negative for visual disturbance.  Respiratory: Negative for cough, chest tightness, shortness of breath and wheezing.   Cardiovascular: Negative for chest pain and palpitations.  Gastrointestinal: Negative for abdominal pain, constipation and abdominal distention.  Endocrine: Negative for polydipsia.  Genitourinary: Negative for dysuria and frequency.  Musculoskeletal: Negative for back pain and arthralgias.  Skin: Negative for rash.  Neurological: Negative for tremors, light-headedness and numbness.  Hematological: Does not bruise/bleed easily.  Psychiatric/Behavioral: Negative for behavioral problems and agitation.    Objective:  BP 125/68 mmHg  Pulse 69  Temp(Src) 98.1 F (36.7 C) (Oral)  Resp 16  Ht  (1.6 m)  Wt 93 lb (42.185 kg)  BMI 16.48 kg/m2  LMP 12/12/2015  BP/Weight 01/19/2016 01/18/2016 01/15/2016  Systolic BP 125 122 128  Diastolic BP 68 58 52  Wt. (Lbs) 93 - 97.6  BMI 16.48 - 17.29      Physical Exam  Constitutional: She is oriented to person, place, and time. She appears well-developed and well-nourished.  Cardiovascular: Normal rate and intact distal pulses.   Murmur (3/6  systolic murmur, 2/6 diastolic murmur) heard. Pulmonary/Chest: Effort normal and breath sounds normal. She has no wheezes. She has no rales. She exhibits no tenderness.  Abdominal: Soft.  Bowel sounds are normal. She exhibits no distension and no mass. There is no tenderness.  Musculoskeletal: Normal range of motion.  Neurological: She is alert and oriented to person, place, and time.  Skin: Skin is warm and dry.     Assessment & Plan:   1. Chronic combined systolic and diastolic heart failure (HCC) Ejection fraction of 40-45% Euvolemic at this time Weight has remained stable; continue low sodium, cardiac diet, daily weight checks, limit fluids to less than 2 L per day. Continue Lasix - monitor creatinine Cardiology appointment the end of month.  2. Atrial flutter with rapid ventricular response (HCC) Currently on Xarelto for anticoagulation Rate control with amiodarone (TSH was normal, PFT revealed obstructive airway disease)  3. Aortic insufficiency Currently undergoing workup for surgery  4. Subvalvar aortic stenosis Scheduled to see Cardiac surgery to discuss aortic valve replacement   5. COPD Asymptomatic Not on any medications at this time.     Follow-up: Return in about 2 weeks (around 02/02/2016) for follow up on CHF.   Jaclyn Shaggy MD

## 2016-01-20 ENCOUNTER — Encounter (HOSPITAL_COMMUNITY): Payer: Self-pay

## 2016-01-20 ENCOUNTER — Other Ambulatory Visit: Payer: Self-pay | Admitting: Internal Medicine

## 2016-01-20 ENCOUNTER — Encounter (HOSPITAL_COMMUNITY)
Admission: RE | Admit: 2016-01-20 | Discharge: 2016-01-20 | Disposition: A | Discharge status: Home / Self Care | Payer: MEDICAID | Source: Ambulatory Visit | Attending: Dentistry | Admitting: Dentistry

## 2016-01-20 DIAGNOSIS — I4892 Unspecified atrial flutter: Secondary | ICD-10-CM | POA: Insufficient documentation

## 2016-01-20 DIAGNOSIS — Z01818 Encounter for other preprocedural examination: Secondary | ICD-10-CM | POA: Insufficient documentation

## 2016-01-20 DIAGNOSIS — IMO0001 Reserved for inherently not codable concepts without codable children: Secondary | ICD-10-CM | POA: Insufficient documentation

## 2016-01-20 DIAGNOSIS — Z87891 Personal history of nicotine dependence: Secondary | ICD-10-CM | POA: Insufficient documentation

## 2016-01-20 DIAGNOSIS — Z01812 Encounter for preprocedural laboratory examination: Secondary | ICD-10-CM | POA: Insufficient documentation

## 2016-01-20 DIAGNOSIS — I350 Nonrheumatic aortic (valve) stenosis: Secondary | ICD-10-CM | POA: Insufficient documentation

## 2016-01-20 DIAGNOSIS — I272 Other secondary pulmonary hypertension: Secondary | ICD-10-CM | POA: Insufficient documentation

## 2016-01-20 DIAGNOSIS — I5042 Chronic combined systolic (congestive) and diastolic (congestive) heart failure: Secondary | ICD-10-CM | POA: Insufficient documentation

## 2016-01-20 DIAGNOSIS — J449 Chronic obstructive pulmonary disease, unspecified: Secondary | ICD-10-CM | POA: Insufficient documentation

## 2016-01-20 DIAGNOSIS — F419 Anxiety disorder, unspecified: Secondary | ICD-10-CM | POA: Insufficient documentation

## 2016-01-20 DIAGNOSIS — Z7901 Long term (current) use of anticoagulants: Secondary | ICD-10-CM | POA: Insufficient documentation

## 2016-01-20 DIAGNOSIS — Z79899 Other long term (current) drug therapy: Secondary | ICD-10-CM | POA: Insufficient documentation

## 2016-01-20 HISTORY — DX: Anxiety disorder, unspecified: F41.9

## 2016-01-20 HISTORY — DX: Chronic obstructive pulmonary disease, unspecified: J44.9

## 2016-01-20 HISTORY — DX: Personal history of pneumonia (recurrent): Z87.01

## 2016-01-20 LAB — SURGICAL PCR SCREEN
MRSA, PCR: NEGATIVE
Staphylococcus aureus: NEGATIVE

## 2016-01-20 LAB — HCG, SERUM, QUALITATIVE: PREG SERUM: NEGATIVE

## 2016-01-20 MED ORDER — RIVAROXABAN 20 MG PO TABS: 20.0000 mg | ORAL_TABLET | Freq: Every evening | ORAL | Status: DC

## 2016-01-20 MED FILL — XARELTO 20 MG TABLET: 20 | 30 days supply | Qty: 30 | Fill #2

## 2016-01-20 NOTE — Pre-Procedure Instructions (Signed)
    Sheila Branch  01/20/2016      [facility]ORE [address], [place] - [address] AT [facility] & [address] [place] [address] Phone: [phone] Fax: [phone]  [facility] DRUG STORE [address], [place] - [address] AT [facility] OF [facility] & [address] [place] [address] Phone: [phone] Fax: [phone]  [facility] [place], [place] - [address] [employer] [place] [address] Phone: [phone] Fax: [phone]  [facility] [address], [place] - [address] [place] [address] Phone: [phone] Fax: [phone]    Your procedure is scheduled on Wednesday, May 17th, 2017.  Report to [facility] Admitting at 6:30 A.M.   Call this number if you have problems the morning of surgery:  [phone]   Remember:  Do not eat food or drink liquids after midnight.   Take these medicines the morning of surgery with A SIP OF WATER: Amiodarone (Pacerone).  Follow your MD's instructions on your Xarelto.  Stop taking: Aspirin, NSAIDS, Aleve, Naproxen, Ibuprofen, Advil, Motrin, BC's, Goody's, Fish oil, all herbal medications, and all vitamins.    Do not wear jewelry, make-up or nail polish.  Do not wear lotions, powders, or perfumes.    Do not shave 48 hours prior to surgery.    Do not bring valuables to the hospital.   [facility] is not responsible for any belongings or valuables.  Contacts, dentures or bridgework may not be worn into surgery.  Leave your suitcase in the car.  After surgery it may be brought to your room.  For patients admitted to the hospital, discharge time will be determined by your treatment team.  Patients discharged the day of surgery will not be allowed to drive home.   Special instructions:  See attached.   Please read over the following fact sheets that you were  given. Pain Booklet, Coughing and Deep Breathing, MRSA Information and Surgical Site Infection Prevention

## 2016-01-20 NOTE — Progress Notes (Signed)
PCP - Dr. Dierdre Searles Cardiologist - Dr. Delton See  EKG - 01/08/16 CXR- 01/12/16  Echo - 11/2015 Cath - 01/2016  Patient denies chest pain and acute shortness of breath at PAT appointment.  Patient states that she was instructed to take her last dose of Xarelto on 01/20/16.  PT-INR to be collected DOS.

## 2016-01-23 NOTE — Progress Notes (Signed)
Anesthesia Chart Review: Patient is a 45 year old female scheduled for multiple teeth extractions with alveoloplasty on 01/25/16 by Dr. Kristin Bruins. Procedure recommended due to anticipated aortic valve surgery.  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 She was hospitalized 10/14/15-11/10/15 for new onset aflutter with RVR and community acquired pneumonia.  She was scheduled for further out-patient work-up including cardiac cath on 01/06/16, but this was postponed due to diarrhea. She was hospitalized 01/08/16-01/16/16 for acute on chronic systolic CHF and rapid afib (converted to SR on amiodarone), and C. Difficile colitis (s/p vancomycin).  PCP is with [facility] (new[facility] Last visit with Dr. Georgian Co on 01/20/16. Cardiologist is Dr. Tobias Alexander. CT surgeon is Dr. Tressie Stalker. He ultimately thinks she will need AVR with resection of subvalvular aortic membrane and possible concomitant Maze procedure.  Meds includes amiodarone, Lasix, Xarelto (held for 3 days per discussion between Dr. Kristin Bruins and Dr. Clifton James).   PAT Vitals: BP 121/52, HR 73, RR 18, O2 sat 97%, T 36.1C.  01/08/16 EKG: Aflutter at 142 bpm, moderate voltage criteria for LVH, ST/T wave abnormality, consider lateral ischemia. 12/22/15 EKG showed SR with PACS, LVH with repolarization abnormality (lateral T wave inversion).  01/12/16 Cardiac CT: IMPRESSION: 1. Normal size of the aortic root and visualized portion of the ascending aorta. 2. Mildly thickened and calcified trileaflet aortic valve with leaflet non-coaptation and subaortic membrane located 6  mm bellow the non-coronary cusp. 3. No ASD or VSD seen. Normal pulmonary vein drainage in the LVOT. 4. Dilated pulmonary artery consistent with pulmonary hypertension. 5. Normal coronary origin with left dominance. No CAD.  01/11/16 RHC/LHC: 1. No angiographic evidence of CAD 2. Elevated right sided pressures. Hemodynamic findings consistent with severe pulmonary hypertension. Elevated LV EDP consistent with volume overload ("...PA pressures measured 90/44 andpulmonary capillary wedge pressure reported 17 mmHg. Fick cardiac output was measured 7.02 L/m corresponding to cardiac index of 4.68. Formal oxygen saturation run was not performed to assess for possible shunt physiology.")  Recommendations: Further workup in regards to her aortic valve disease per cardiology consult team.   11/09/15 TEE: Study Conclusions - Left ventricle: Systolic function was mildly to moderately  reduced. The estimated ejection fraction was in the range of 40%  to 45%. Diffuse hypokinesis. - Aortic valve: There is thickening and calcification of the left  coronary cusp. There is also a calcified subaortic membrane seen  about 0.5 cm bellow the oartic valve in the LVOT. The transaortic  gradeints were not calculated on the current study.  The leaflets don&'t coapt well and there is severe aortic  regurgitation. - Left atrium: The atrium was severely dilated. No evidence of  thrombus in the atrial cavity or appendage. No evidence of  thrombus in the atrial cavity or appendage. No evidence of  thrombus in the appendage. - Right atrium: No evidence of thrombus in the atrial cavity or  appendage. - Tricuspid valve: There was mild-moderate regurgitation. - Pulmonic valve: No evidence of vegetation. There was moderate  regurgitation.  11/05/15 TTE: Impressions: - No ASD, PFO or VSD closure devide was noted. There is a  calcified, sub-aortic lesion in the LVOT that is likely causing  sub-valvular stenosis. There is clearly a gradient consistent  with moderate stenosis, though it cannot be determined from this  echo how much of the gradient is from the aortic valve vs. the  subvalvular calcification. There is an echodensity in the main  pulmonary artery that is likely acoustic shadow from the cacified  aortic valve and subvalvular apparatus. However, thrombus cannot  be excluded. If there is suspicion for pulmonary embolism,  consider further evaluation. PA peak pressure 50 mm Hg (S).  01/15/16 Carotid U/S: Summary: BIlateral: no significant plaque noted. No significant ICA stenosis. Vertebral artery flow is antegrade. Preoperative labs noted. She will need a PT/INR on the day of surgery.    01/12/16 CXR: IMPRESSION: Interstitial edema with cardiomegaly and small effusions.  01/12/16 PFTs: FVC 1.10 (32%), FEV1 0.92 (33%), DLCOunc 13.77 (63%). Severe obstructive airways disease. Insignificant response to bronchodilator. Severe restriction. Increased diffusion.  Labs on 01/14/15-01/20/16 noted. Negative serum pregnancy test. A1c on 01/12/16 was 6.1.   Will get an EKG on arrival to reassess baseline rhythm. By notes, she was back in SR on 01/15/16. Further evaluation by her assigned anesthesiologist on the day of surgery to determine the definitive anesthesia plan.   Shonna Chock, PA-C [facility] Short Stay Center/Anesthesiology Phone [phone] 01/23/2016 1:20 PM

## 2016-01-24 NOTE — Anesthesia Preprocedure Evaluation (Addendum)
Anesthesia Evaluation  Patient identified by MRN, date of birth, ID band Patient awake    Reviewed: Allergy & Precautions, NPO status , Patient's Chart, lab work & pertinent test results  Airway Mallampati: I  TM Distance: >3 FB Neck ROM: Full    Dental  (+) Poor Dentition, Dental Advisory Given   Pulmonary COPD, former smoker,    breath sounds clear to auscultation       Cardiovascular +CHF  + dysrhythmias Atrial Fibrillation + Valvular Problems/Murmurs AI and AS  Rhythm:Regular Rate:Normal + Systolic murmurs and + Diastolic murmurs    Neuro/Psych Anxiety negative neurological ROS     GI/Hepatic negative GI ROS, Neg liver ROS,   Endo/Other  negative endocrine ROS  Renal/GU negative Renal ROS  negative genitourinary   Musculoskeletal negative musculoskeletal ROS (+)   Abdominal Normal abdominal exam  (+)   Peds negative pediatric ROS (+)  Hematology negative hematology ROS (+)   Anesthesia Other Findings   Reproductive/Obstetrics negative OB ROS                           Lab Results  Component Value Date   WBC 6.9 01/15/2016   HGB 11.5* 01/15/2016   HCT 36.3 01/15/2016   MCV 92.4 01/15/2016   PLT 185 01/15/2016   Lab Results  Component Value Date   CREATININE 0.87 01/14/2016   BUN 18 01/14/2016   NA 137 01/14/2016   K 4.6 01/14/2016   CL 97* 01/14/2016   CO2 30 01/14/2016   Lab Results  Component Value Date   INR 1.41 01/04/2016   INR 1.14 11/03/2015   Echo: - Left ventricle: Systolic function was mildly to moderately reduced. The estimated ejection fraction was in the range of 40% to 45%. Diffuse hypokinesis. - Aortic valve: There is thickening and calcification of the left coronary cusp. There is also a calcified subaortic membrane seen about 0.5 cm bellow the oartic valve in the LVOT. The transaortic gradeints were not calculated on the current study. The leaflets  don&'t coapt well and there is severe aortic regurgitation. - Left atrium: The atrium was severely dilated. No evidence of thrombus in the atrial cavity or appendage. No evidence of thrombus in the atrial cavity or appendage. No evidence of thrombus in the appendage. - Right atrium: No evidence of thrombus in the atrial cavity or appendage. - Tricuspid valve: There was mild-moderate regurgitation. - Pulmonic valve: No evidence of vegetation. There was moderate regurgitation.  EKG: Atrial Flutter   Anesthesia Physical Anesthesia Plan  ASA: III  Anesthesia Plan: General   Post-op Pain Management:    Induction: Intravenous  Airway Management Planned: Nasal ETT  Additional Equipment: Arterial line  Intra-op Plan:   Post-operative Plan: Extubation in OR  Informed Consent: I have reviewed the patients History and Physical, chart, labs and discussed the procedure including the risks, benefits and alternatives for the proposed anesthesia with the patient or authorized representative who has indicated his/her understanding and acceptance.   Dental advisory given  Plan Discussed with: CRNA  Anesthesia Plan Comments: (Anesthetic plan discussed in detail. Associated risk discussed including but not limited to life threatening cardiovascular, pulmonary events and dental damage. The postoperative pain management and antiemetic plan discussed with patient. All questions answered in detail. Patient is in agreement.    The possibiilty of severe cardiac compromise leading to death during the procedure was discussed in detail. I discussed the possibility of MAC sedation with the  patient and the proceduralist to decrease the risk of cardiovascular collapse. The patient is aware of the risk and would like to proceed with general anesthesia. )       Anesthesia Quick Evaluation

## 2016-01-25 ENCOUNTER — Encounter (HOSPITAL_COMMUNITY): Payer: Self-pay | Admitting: *Deleted

## 2016-01-25 ENCOUNTER — Ambulatory Visit (HOSPITAL_COMMUNITY)
Admission: AD | Admit: 2016-01-25 | Discharge: 2016-01-25 | Disposition: A | Discharge status: Home / Self Care | Payer: Self-pay | Source: Ambulatory Visit | Attending: Dentistry | Admitting: Dentistry

## 2016-01-25 ENCOUNTER — Ambulatory Visit (HOSPITAL_COMMUNITY): Payer: MEDICAID | Admitting: Vascular Surgery

## 2016-01-25 ENCOUNTER — Encounter (HOSPITAL_COMMUNITY)
Admission: AD | Disposition: A | Discharge status: Home / Self Care | Payer: Self-pay | Source: Ambulatory Visit | Attending: Dentistry

## 2016-01-25 ENCOUNTER — Ambulatory Visit (HOSPITAL_COMMUNITY): Payer: MEDICAID | Admitting: Anesthesiology

## 2016-01-25 DIAGNOSIS — Q249 Congenital malformation of heart, unspecified: Secondary | ICD-10-CM | POA: Insufficient documentation

## 2016-01-25 DIAGNOSIS — I351 Nonrheumatic aortic (valve) insufficiency: Secondary | ICD-10-CM | POA: Insufficient documentation

## 2016-01-25 DIAGNOSIS — K083 Retained dental root: Secondary | ICD-10-CM

## 2016-01-25 DIAGNOSIS — J449 Chronic obstructive pulmonary disease, unspecified: Secondary | ICD-10-CM | POA: Insufficient documentation

## 2016-01-25 DIAGNOSIS — K029 Dental caries, unspecified: Secondary | ICD-10-CM | POA: Insufficient documentation

## 2016-01-25 DIAGNOSIS — Z87891 Personal history of nicotine dependence: Secondary | ICD-10-CM | POA: Insufficient documentation

## 2016-01-25 DIAGNOSIS — Q244 Congenital subaortic stenosis: Secondary | ICD-10-CM

## 2016-01-25 DIAGNOSIS — K0530 Chronic periodontitis, unspecified: Secondary | ICD-10-CM

## 2016-01-25 DIAGNOSIS — K036 Deposits [accretions] on teeth: Secondary | ICD-10-CM | POA: Insufficient documentation

## 2016-01-25 DIAGNOSIS — I350 Nonrheumatic aortic (valve) stenosis: Secondary | ICD-10-CM | POA: Insufficient documentation

## 2016-01-25 DIAGNOSIS — K045 Chronic apical periodontitis: Secondary | ICD-10-CM | POA: Insufficient documentation

## 2016-01-25 DIAGNOSIS — I5042 Chronic combined systolic (congestive) and diastolic (congestive) heart failure: Secondary | ICD-10-CM | POA: Insufficient documentation

## 2016-01-25 DIAGNOSIS — I481 Persistent atrial fibrillation: Secondary | ICD-10-CM | POA: Insufficient documentation

## 2016-01-25 HISTORY — PX: MULTIPLE EXTRACTIONS WITH ALVEOLOPLASTY: SHX5342

## 2016-01-25 HISTORY — PX: MOUTH SURGERY: SHX715

## 2016-01-25 LAB — PROTIME-INR
INR: 1.12 (ref 0.00–1.49)
PROTHROMBIN TIME: 14.6 s (ref 11.6–15.2)

## 2016-01-25 SURGERY — MULTIPLE EXTRACTION WITH ALVEOLOPLASTY
Anesthesia: General | Site: Mouth

## 2016-01-25 MED ORDER — PROMETHAZINE HCL 25 MG/ML IJ SOLN
6.2500 mg | INTRAMUSCULAR | Status: DC | PRN
Start: 2016-01-25 — End: 2016-01-25
  Administered 2016-01-25: 6.25 mg via INTRAVENOUS

## 2016-01-25 MED ORDER — HYDROMORPHONE HCL 1 MG/ML IJ SOLN
INTRAMUSCULAR | Status: AC
  Administered 2016-01-25: 0.25 mg via INTRAVENOUS
  Filled 2016-01-25: qty 1

## 2016-01-25 MED ORDER — ARTIFICIAL TEARS OP OINT
TOPICAL_OINTMENT | OPHTHALMIC | Status: DC | PRN
Start: 2016-01-25 — End: 2016-01-25
  Administered 2016-01-25: 1 via OPHTHALMIC

## 2016-01-25 MED ORDER — LACTATED RINGERS IV SOLN: INTRAVENOUS | Status: DC

## 2016-01-25 MED ORDER — LIDOCAINE 2% (20 MG/ML) 5 ML SYRINGE
INTRAMUSCULAR | Status: AC
  Filled 2016-01-25: qty 15

## 2016-01-25 MED ORDER — ROCURONIUM BROMIDE 100 MG/10ML IV SOLN
INTRAVENOUS | Status: DC | PRN
  Administered 2016-01-25: 40 mg via INTRAVENOUS

## 2016-01-25 MED ORDER — PHENYLEPHRINE 40 MCG/ML (10ML) SYRINGE FOR IV PUSH (FOR BLOOD PRESSURE SUPPORT)
PREFILLED_SYRINGE | INTRAVENOUS | Status: AC
  Filled 2016-01-25: qty 10

## 2016-01-25 MED ORDER — AMINOCAPROIC ACID SOLUTION 5% (50 MG/ML)
10.0000 mL | ORAL | Status: DC
  Filled 2016-01-25: qty 100

## 2016-01-25 MED ORDER — NEOSTIGMINE METHYLSULFATE 10 MG/10ML IV SOLN
INTRAVENOUS | Status: DC | PRN
  Administered 2016-01-25: 3 mg via INTRAVENOUS

## 2016-01-25 MED ORDER — AMINOCAPROIC ACID SOLUTION 5% (50 MG/ML)
ORAL | Status: DC | PRN
Start: 2016-01-25 — End: 2016-01-25
  Administered 2016-01-25: 10 mL via ORAL

## 2016-01-25 MED ORDER — OXYCODONE-ACETAMINOPHEN 5-325 MG PO TABS
ORAL_TABLET | ORAL | Status: AC
  Administered 2016-01-25: 2 via ORAL
  Filled 2016-01-25: qty 2

## 2016-01-25 MED ORDER — BUPIVACAINE-EPINEPHRINE 0.5% -1:200000 IJ SOLN
INTRAMUSCULAR | Status: DC | PRN
Start: 2016-01-25 — End: 2016-01-25
  Administered 2016-01-25: 3.6 mL

## 2016-01-25 MED ORDER — GLYCOPYRROLATE 0.2 MG/ML IV SOSY
PREFILLED_SYRINGE | INTRAVENOUS | Status: AC
  Filled 2016-01-25: qty 3

## 2016-01-25 MED ORDER — LIDOCAINE-EPINEPHRINE 2 %-1:100000 IJ SOLN
INTRAMUSCULAR | Status: AC
  Filled 2016-01-25: qty 3.4

## 2016-01-25 MED ORDER — AMIODARONE HCL 100 MG PO TABS
100.0000 mg | ORAL_TABLET | Freq: Once | ORAL | Status: AC
  Administered 2016-01-25: 100 mg via ORAL
  Filled 2016-01-25: qty 1

## 2016-01-25 MED ORDER — OXYCODONE-ACETAMINOPHEN 5-325 MG PO TABS
1.0000 | ORAL_TABLET | Freq: Four times a day (QID) | ORAL | Status: DC | PRN
  Administered 2016-01-25: 2 via ORAL
  Filled 2016-01-25: qty 2

## 2016-01-25 MED ORDER — OXYCODONE-ACETAMINOPHEN 5-325 MG PO TABS: ORAL_TABLET | ORAL | Status: DC

## 2016-01-25 MED ORDER — ROCURONIUM BROMIDE 50 MG/5ML IV SOLN
INTRAVENOUS | Status: AC
  Filled 2016-01-25: qty 2

## 2016-01-25 MED ORDER — LACTATED RINGERS IV SOLN
INTRAVENOUS | Status: DC | PRN
  Administered 2016-01-25: 08:00:00 via INTRAVENOUS

## 2016-01-25 MED ORDER — STERILE WATER FOR IRRIGATION IR SOLN
Status: DC | PRN
  Administered 2016-01-25: 1000 mL

## 2016-01-25 MED ORDER — LIDOCAINE HCL (CARDIAC) 20 MG/ML IV SOLN
INTRAVENOUS | Status: DC | PRN
  Administered 2016-01-25: 60 mg via INTRAVENOUS

## 2016-01-25 MED ORDER — HEMOSTATIC AGENTS (NO CHARGE) OPTIME
TOPICAL | Status: DC | PRN
  Administered 2016-01-25 (×2): 1 via TOPICAL

## 2016-01-25 MED ORDER — CEFAZOLIN SODIUM 1 G IJ SOLR
INTRAMUSCULAR | Status: AC
  Filled 2016-01-25: qty 20

## 2016-01-25 MED ORDER — LACTATED RINGERS IV SOLN
INTRAVENOUS | Status: DC | PRN
  Administered 2016-01-25: 09:00:00 via INTRAVENOUS

## 2016-01-25 MED ORDER — MIDAZOLAM HCL 2 MG/2ML IJ SOLN
INTRAMUSCULAR | Status: AC
  Filled 2016-01-25: qty 2

## 2016-01-25 MED ORDER — PROPOFOL 10 MG/ML IV BOLUS
INTRAVENOUS | Status: AC
  Filled 2016-01-25: qty 40

## 2016-01-25 MED ORDER — MIDAZOLAM HCL 5 MG/5ML IJ SOLN
INTRAMUSCULAR | Status: DC | PRN
  Administered 2016-01-25: 0.5 mg via INTRAVENOUS

## 2016-01-25 MED ORDER — EPHEDRINE 5 MG/ML INJ
INTRAVENOUS | Status: AC
  Filled 2016-01-25: qty 10

## 2016-01-25 MED ORDER — ETOMIDATE 2 MG/ML IV SOLN
INTRAVENOUS | Status: AC
  Filled 2016-01-25: qty 10

## 2016-01-25 MED ORDER — NEOSTIGMINE METHYLSULFATE 5 MG/5ML IV SOSY
PREFILLED_SYRINGE | INTRAVENOUS | Status: AC
  Filled 2016-01-25: qty 5

## 2016-01-25 MED ORDER — GLYCOPYRROLATE 0.2 MG/ML IJ SOLN
INTRAMUSCULAR | Status: DC | PRN
  Administered 2016-01-25: 0.4 mg via INTRAVENOUS

## 2016-01-25 MED ORDER — OXYMETAZOLINE HCL 0.05 % NA SOLN
NASAL | Status: DC | PRN
  Administered 2016-01-25: 1 via TOPICAL

## 2016-01-25 MED ORDER — HYDROMORPHONE HCL 1 MG/ML IJ SOLN
0.2500 mg | INTRAMUSCULAR | Status: DC | PRN
  Administered 2016-01-25 (×2): 0.25 mg via INTRAVENOUS

## 2016-01-25 MED ORDER — PROMETHAZINE HCL 25 MG/ML IJ SOLN
INTRAMUSCULAR | Status: AC
  Filled 2016-01-25: qty 1

## 2016-01-25 MED ORDER — GLYCOPYRROLATE 0.2 MG/ML IV SOSY
PREFILLED_SYRINGE | INTRAVENOUS | Status: AC
  Filled 2016-01-25: qty 9

## 2016-01-25 MED ORDER — ROCURONIUM BROMIDE 50 MG/5ML IV SOLN
INTRAVENOUS | Status: AC
  Filled 2016-01-25: qty 1

## 2016-01-25 MED ORDER — LIDOCAINE-EPINEPHRINE 2 %-1:100000 IJ SOLN
INTRAMUSCULAR | Status: AC
  Filled 2016-01-25: qty 6.8

## 2016-01-25 MED ORDER — SUCCINYLCHOLINE CHLORIDE 200 MG/10ML IV SOSY
PREFILLED_SYRINGE | INTRAVENOUS | Status: AC
  Filled 2016-01-25: qty 10

## 2016-01-25 MED ORDER — FENTANYL CITRATE (PF) 100 MCG/2ML IJ SOLN
INTRAMUSCULAR | Status: DC | PRN
  Administered 2016-01-25: 50 ug via INTRAVENOUS
  Administered 2016-01-25 (×2): 25 ug via INTRAVENOUS
  Administered 2016-01-25: 50 ug via INTRAVENOUS

## 2016-01-25 MED ORDER — PHENYLEPHRINE HCL 10 MG/ML IJ SOLN
10.0000 mg | INTRAVENOUS | Status: DC | PRN
  Administered 2016-01-25: 20 ug/min via INTRAVENOUS

## 2016-01-25 MED ORDER — ARTIFICIAL TEARS OP OINT
TOPICAL_OINTMENT | OPHTHALMIC | Status: AC
  Filled 2016-01-25: qty 3.5

## 2016-01-25 MED ORDER — LIDOCAINE-EPINEPHRINE 2 %-1:100000 IJ SOLN
INTRAMUSCULAR | Status: DC | PRN
Start: 2016-01-25 — End: 2016-01-25
  Administered 2016-01-25: 7.2 mL

## 2016-01-25 MED ORDER — DEXAMETHASONE SODIUM PHOSPHATE 10 MG/ML IJ SOLN
INTRAMUSCULAR | Status: AC
  Filled 2016-01-25: qty 1

## 2016-01-25 MED ORDER — OXYMETAZOLINE HCL 0.05 % NA SOLN
NASAL | Status: AC
  Filled 2016-01-25: qty 15

## 2016-01-25 MED ORDER — NEOSTIGMINE METHYLSULFATE 5 MG/5ML IV SOSY
PREFILLED_SYRINGE | INTRAVENOUS | Status: AC
  Filled 2016-01-25: qty 10

## 2016-01-25 MED ORDER — FENTANYL CITRATE (PF) 250 MCG/5ML IJ SOLN
INTRAMUSCULAR | Status: AC
  Filled 2016-01-25: qty 5

## 2016-01-25 MED ORDER — MEPERIDINE HCL 25 MG/ML IJ SOLN: 6.2500 mg | INTRAMUSCULAR | Status: DC | PRN

## 2016-01-25 MED ORDER — ETOMIDATE 2 MG/ML IV SOLN
INTRAVENOUS | Status: DC | PRN
  Administered 2016-01-25: 8 mg via INTRAVENOUS

## 2016-01-25 MED ORDER — BUPIVACAINE-EPINEPHRINE (PF) 0.5% -1:200000 IJ SOLN
INTRAMUSCULAR | Status: AC
  Filled 2016-01-25: qty 3.6

## 2016-01-25 MED ORDER — 0.9 % SODIUM CHLORIDE (POUR BTL) OPTIME
TOPICAL | Status: DC | PRN
  Administered 2016-01-25: 1000 mL

## 2016-01-25 MED ORDER — CLINDAMYCIN PHOSPHATE 600 MG/50ML IV SOLN
600.0000 mg | Freq: Once | INTRAVENOUS | Status: AC
  Administered 2016-01-25: 600 mg via INTRAVENOUS
  Filled 2016-01-25: qty 50

## 2016-01-25 SURGICAL SUPPLY — 38 items
ALCOHOL 70% 16 OZ (MISCELLANEOUS) ×3 IMPLANT
ATTRACTOMAT 16X20 MAGNETIC DRP (DRAPES) ×3 IMPLANT
BLADE SURG 15 STRL LF DISP TIS (BLADE) ×2 IMPLANT
BLADE SURG 15 STRL SS (BLADE) ×6
COVER SURGICAL LIGHT HANDLE (MISCELLANEOUS) ×3 IMPLANT
GAUZE PACKING FOLDED 2  STR (GAUZE/BANDAGES/DRESSINGS) ×2
GAUZE PACKING FOLDED 2 STR (GAUZE/BANDAGES/DRESSINGS) ×1 IMPLANT
GAUZE SPONGE 4X4 16PLY XRAY LF (GAUZE/BANDAGES/DRESSINGS) ×3 IMPLANT
GLOVE BIOGEL PI IND STRL 6 (GLOVE) ×1 IMPLANT
GLOVE BIOGEL PI INDICATOR 6 (GLOVE) ×2
GLOVE SURG ORTHO 8.0 STRL STRW (GLOVE) ×3 IMPLANT
GLOVE SURG SS PI 6.0 STRL IVOR (GLOVE) ×3 IMPLANT
GOWN STRL REUS W/ TWL LRG LVL3 (GOWN DISPOSABLE) ×1 IMPLANT
GOWN STRL REUS W/TWL 2XL LVL3 (GOWN DISPOSABLE) ×3 IMPLANT
GOWN STRL REUS W/TWL LRG LVL3 (GOWN DISPOSABLE) ×3
HEMOSTAT SURGICEL 2X14 (HEMOSTASIS) ×1 IMPLANT
KIT BASIN OR (CUSTOM PROCEDURE TRAY) ×3 IMPLANT
KIT ROOM TURNOVER OR (KITS) ×3 IMPLANT
MANIFOLD NEPTUNE WASTE (CANNULA) ×3 IMPLANT
NDL BLUNT 16X1.5 OR ONLY (NEEDLE) ×1 IMPLANT
NEEDLE BLUNT 16X1.5 OR ONLY (NEEDLE) ×3 IMPLANT
NS IRRIG 1000ML POUR BTL (IV SOLUTION) ×3 IMPLANT
PACK EENT II TURBAN DRAPE (CUSTOM PROCEDURE TRAY) ×3 IMPLANT
PAD ARMBOARD 7.5X6 YLW CONV (MISCELLANEOUS) ×3 IMPLANT
SPONGE SURGIFOAM ABS GEL 100 (HEMOSTASIS) IMPLANT
SPONGE SURGIFOAM ABS GEL 12-7 (HEMOSTASIS) IMPLANT
SPONGE SURGIFOAM ABS GEL SZ50 (HEMOSTASIS) IMPLANT
SUCTION FRAZIER HANDLE 10FR (MISCELLANEOUS) ×2
SUCTION TUBE FRAZIER 10FR DISP (MISCELLANEOUS) ×1 IMPLANT
SUT CHROMIC 3 0 PS 2 (SUTURE) ×10 IMPLANT
SUT CHROMIC 4 0 P 3 18 (SUTURE) IMPLANT
SYR 50ML SLIP (SYRINGE) ×3 IMPLANT
TOWEL OR 17X26 10 PK STRL BLUE (TOWEL DISPOSABLE) ×3 IMPLANT
TUBE CONNECTING 12'X1/4 (SUCTIONS) ×1
TUBE CONNECTING 12X1/4 (SUCTIONS) ×2 IMPLANT
WATER STERILE IRR 1000ML POUR (IV SOLUTION) ×2 IMPLANT
WATER TABLETS ICX (MISCELLANEOUS) ×3 IMPLANT
YANKAUER SUCT BULB TIP NO VENT (SUCTIONS) ×3 IMPLANT

## 2016-01-25 NOTE — Progress Notes (Addendum)
EKG done on arrival. Abnormal. resutlts called to Dr. Okey Dupre at [phone].Stated to contact Dr. Hart Rochester. Dr. Hart Rochester notified.

## 2016-01-25 NOTE — Anesthesia Postprocedure Evaluation (Signed)
Anesthesia Post Note  Patient: JANUS RAAD  Procedure(s) Performed: Procedure(s) (LRB): Extraction of tooth #'s [phone] with alveoloplasty and gross debridement of remaining teeth. (N/A)  Patient location during evaluation: PACU Anesthesia Type: General Level of consciousness: awake and alert Pain management: pain level controlled Vital Signs Assessment: post-procedure vital signs reviewed and stable Respiratory status: spontaneous breathing, nonlabored ventilation, respiratory function stable and patient connected to nasal cannula oxygen Cardiovascular status: blood pressure returned to baseline and stable Postop Assessment: no signs of nausea or vomiting Anesthetic complications: no    Last Vitals:  Filed Vitals:   01/25/16 1200 01/25/16 1215  BP: 133/43 130/34  Pulse: 58 58  Temp:    Resp: 12 11    Last Pain:  Filed Vitals:   01/25/16 1231  PainSc: 0-No pain                 Shelton Silvas

## 2016-01-25 NOTE — Discharge Instructions (Signed)
PATIENT TO RESTART XARELTO ON THURSDAY, Jan 26, 2016 IN THE EVENING.  DR. Kristin Bruins       MOUTH CARE AFTER SURGERY  FACTS:  Ice used in ice bag helps keep the swelling down, and can help lessen the pain.  It is easier to treat pain BEFORE it happens.  Spitting disturbs the clot and may cause bleeding to start again, or to get worse.  Smoking delays healing and can cause complications.  Sharing prescriptions can be dangerous.  Do not take medications not recently prescribed for you.  Antibiotics may stop birth control pills from working.  Use other means of birth control while on antibiotics.  Warm salt water rinses after the first 24 hours will help lessen the swelling:  Use 1/2 teaspoonful of table salt per oz.of water.  DO NOT:  Do not spit.  Do not drink through a straw.  Strongly advised not to smoke, dip snuff or chew tobacco at least for 3 days.  Do not eat sharp or crunchy foods.  Avoid the area of surgery when chewing.  Do not stop your antibiotics before your instructions say to do so.  Do not eat hot foods until bleeding has stopped.  If you need to, let your food cool down to room temperature.  EXPECT:  Some swelling, especially first 2-3 days.  Soreness or discomfort in varying degrees.  Follow your dentist's instructions about how to handle pain before it starts.  Pinkish saliva or light blood in saliva, or on your pillow in the morning.  This can last around 24 hours.  Bruising inside or outside the mouth.  This may not show up until 2-3 days after surgery.  Don't worry, it will go away in time.  Pieces of "bone" may work themselves loose.  It's OK.  If they bother you, let us know.  WHAT TO DO IMMEDIATELY AFTER SURGERY:  Bite on the gauze with steady pressure for 1-2 hours.  Don't chew on the gauze.  Do not lie down flat.  Raise your head support especially for the first 24 hours.  Apply ice to your face on the side of the surgery.  You may apply  it 20 minutes on and a few minutes off.  Ice for 8-12 hours.  You may use ice up to 24 hours.  Before the numbness wears off, take a pain pill as instructed.  Prescription pain medication is not always required.  SWELLING:  Expect swelling for the first couple of days.  It should get better after that.  If swelling increases 3 days or so after surgery; let us know as soon as possible.  FEVER:  Take Tylenol every 4 hours if needed to lower your temperature, especially if it is at 100F or higher.  Drink lots of fluids.  If the fever does not go away, let us know.  BREATHING TROUBLE:  Any unusual difficulty breathing means you have to have someone bring you to the emergency room ASAP  BLEEDING:  Light oozing is expected for 24 hours or so.  Prop head up with pillows  Avoid spitting  Do not confuse bright red fresh flowing blood with lots of saliva colored with a little bit of blood.  If you notice some bleeding, place gauze or a tea bag where it is bleeding and apply CONSTANT pressure by biting down for 1 hour.  Avoid talking during this time.  Do not remove the gauze or tea bag during this hour to "check" the  bleeding.  If you notice bright RED bleeding FLOWING out of particular area, and filling the floor of your mouth, put a wad of gauze on that area, bite down firmly and constantly.  Call us immediately.  If we're closed, have someone bring you to the emergency room.  ORAL HYGIENE:  Brush your teeth as usual after meals and before bedtime.  Use a soft toothbrush around the area of surgery.  DO NOT AVOID BRUSHING.  Otherwise bacteria(germs) will grow and may delay healing or encourage infection.  Since you cannot spit, just gently rinse and let the water flow out of your mouth.  DO NOT SWISH HARD.  EATING:  Cool liquids are a good point to start.  Increase to soft foods as tolerated.  PRESCRIPTIONS:  Follow the directions for your prescriptions exactly as  written.  If Dr. Kristin Bruins gave you a narcotic pain medication, do not drive, operate machinery or drink alcohol when on that medication.  QUESTIONS:  Call our office during office hours [phone] or call the Emergency Room at [phone].

## 2016-01-25 NOTE — H&P (Signed)
01/25/2016  Patient:            Sheila Branch Date of Birth:  May 29, 1971 MRN:                [id number]   BP 134/40 mmHg  Pulse 68  Temp(Src) 97.8 F (36.6 C)  Resp 18  Ht 5' 3.5" (1.613 m)  Wt 92 lb (41.731 kg)  BMI 16.04 kg/m2  SpO2 97%  LMP 12/12/2015   Sheila Branch presents for dental operating room procedures. Patient denies any acute medical or dental changes. See note from Dr. Cornelius Moras dated 01/11/16 to use as H and P for dental OR procedure.  Dr. Kristin Bruins  Patient Information    Patient Name Sex DOB SSN   Sheila Branch Female [birth year] [id number]    Consult Note by Purcell Nails, MD at 01/11/2016 6:52 PM    Author: Purcell Nails, MD Service: Cardiothoracic Surgery Author Type: Physician   Filed: 01/12/2016 9:56 AM Note Time: 01/11/2016 6:52 PM Status: Addendum   Editor: Purcell Nails, MD (Physician)     Related Notes: Original Note by Purcell Nails, MD (Physician) filed at 01/12/2016 9:53 AM   Expand All Collapse All       [address]  [place] [address]  [phone]    CARDIOTHORACIC SURGERY CONSULTATION REPORT  PCP is Pete Glatter, MD Referring Provider is Lyn Records, MD Primary Cardiologist is Tobias Alexander, MD  Reason for consultation: Aortic Insufficiency, Subaortic Stenosis, Recurrent Persistent Atrial Fibrillation  HPI:  Patient is a 45 year old female with history of congenital heart disease that has never previously been surgically corrected with been referred for surgical consultation to discuss treatment options for management of 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 The patient states that she has known that she has had a heart murmur all of her life and has been told that she had a "hole in her heart" She apparently underwent cardiac catheterization during her childhood and her mother was told that she needed cardiac surgery. According to the  patient her mother insisted on postponing surgical intervention and treating her medically as long as possible. The patient moved to [place] within 20 years ago. She apparently underwent echocardiography and was evaluated by cardiologist when she was pregnant with her second child approximately 18 years ago. She carried her child to term pregnancy and had an uncomplicated delivery. She never returned for medical follow-up.  The patient states that approximately 9 months ago she began to experience progressive symptoms of exertional shortness of breath Symptoms gradually got worse and became problematic with relatively mild physical activity and occasionally at rest. She developed symptoms of orthopnea, PND and lower extremity edema. He quit smoking approximately 4 months ago but shortness of breath continued to get worse. She was hospitalized acutely from 11/03/2015 through 11/08/2015 for community acquired pneumonia and rapid atrial fibrillation and atrial flutter. Transthoracic echocardiogram performed at that time revealed severe aortic insufficiency with subvalvar aortic stenosis. There was mild left ventricular systolic dysfunction with ejection fraction estimated 50-55%. There was trivial mitral regurgitation but severe left atrial enlargement. There was moderate right atrial enlargement and findings consistent with moderate pulmonary hypertension. The patient underwent TEE and was successfully cardioverted back to sinus rhythm. Transesophageal echocardiogram revealed diffuse "left ventricular systolic dysfunction with ejection fraction estimated 40% There was severe aortic insufficiency and subvalvar aortic membrane. She was discharged from the hospital and seen in follow-up by Sheila Branch on 12/22/2015.  The patient continued to experience symptoms of exertional shortness of breath, chest pain and a productive cough. She subsequently developed GI illness with diarrhea. She was readmitted to  the hospital 01/06/2016 with recurrent persistent atrial fibrillation associated with rapid ventricular response. She spontaneously converted back to sinus rhythm on intravenous amiodarone. Diagnostic cardiac catheterization revealed normal coronary artery anatomy with no significant coronary artery disease. She had severe pulmonary hypertension with PA pressures measured 90/44 andpulmonary capillary wedge pressure reported 17 mmHg. Fick cardiac output was measured 7.02 L/m corresponding to cardiac index of 4.68. Formal oxygen saturation run was not performed to assess for possible shunt physiology. Cardiothoracic surgical consultation was requested.  The patient is single and lives locally in [place] with her brother. 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 Her mother passed away several years ago. She has to a adult children, the youngest of which is in college locally in [place]. He describes a long-standing history of exertional shortness of breath has progressed over the past year. In recent months she gets short of breath with low level physical activity and occasionally at rest She has occasional episodes of PND and orthopnea. She has had episodes of chest pain or chest tightness that have been exacerbated by tachycardia. She has not had dizzy spells or syncope. She has mild lower extremity edema. Since hospital admission her breathing has improved. She states that her diarrhea has improved and her appetite is slowly returning to normal.   Past Medical History  Diagnosis Date  . Aortic insufficiency     severe  . Subvalvar aortic stenosis   . Chronic combined systolic and diastolic congestive heart failure (HCC)   . Atrial fibrillation with rapid ventricular response (HCC) 01/08/2016  . Persistent atrial fibrillation (HCC) 11/03/2015  . CHD (congenital heart disease)     Past Surgical History  Procedure Laterality Date  . Cardiac  catheterization  childhood  . Cesarean section    . Rectal surgery    . Tubal ligation    . Cardioversion N/A 11/09/2015    Procedure: CARDIOVERSION; Surgeon: Lars Masson, MD; Location: [facility] ENDOSCOPY; Service: Cardiovascular; Laterality: N/A;  . Tee without cardioversion N/A 11/09/2015    Procedure: TRANSESOPHAGEAL ECHOCARDIOGRAM (TEE); Surgeon: Lars Masson, MD; Location: [facility] ENDOSCOPY; Service: Cardiovascular; Laterality: N/A;  . Cardiac catheterization N/A 01/11/2016    Procedure: Right/Left Heart Cath and Coronary Angiography; Surgeon: Kathleene Hazel, MD; Location: [facility] INVASIVE CV LAB; Service: Cardiovascular; Laterality: N/A;    Family History  Problem Relation Age of Onset  . Heart disease Mother     "i think she died of heart issue"    Social History   Social History  . Marital Status: Single    Spouse Name: N/A  . Number of Children: N/A  . Years of Education: N/A   Occupational History  . Not on file.   Social History Main Topics  . Smoking status: Former Smoker -- 1.00 packs/day for 23 years    Types: Cigarettes    Quit date: 09/12/2015  . Smokeless tobacco: Not on file  . Alcohol Use: No     Comment: occasionally  . Drug Use: Yes    Special: Marijuana     Comment: occasionally  . Sexual Activity: Not on file   Other Topics Concern  . Not on file   Social History Narrative    Prior to Admission medications   Medication Sig Start Date End Date Taking? Authorizing Provider  furosemide (LASIX) 20  MG tablet Take 20 mg by mouth daily as needed. FOR SWELLING ONLY   Yes Historical Provider, MD  metoprolol succinate (TOPROL XL) 100 MG 24 hr tablet Take 1 tablet (100 mg total) by mouth daily. Take with or immediately following a meal. 12/12/15  Yes Dyann Kief, PA-C  rivaroxaban (XARELTO) 20 MG TABS  tablet Take 1 tablet (20 mg total) by mouth daily with supper. Patient taking differently: Take 20 mg by mouth every evening.  01/02/16  Yes Leone Brand, NP  triamcinolone (KENALOG) 0.1 % paste Use as directed 1 application in the mouth or throat 2 (two) times daily. Patient not taking: Reported on 01/06/2016 11/16/15   Pete Glatter, MD    Current Facility-Administered Medications  Medication Dose Route Frequency Provider Last Rate Last Dose  . 0.9 % sodium chloride infusion 250 mL Intravenous PRN Kathleene Hazel, MD    . acetaminophen (TYLENOL) tablet 650 mg 650 mg Oral Q4H PRN Lora Paula, MD    . amiodarone (PACERONE) tablet 200 mg 200 mg Oral BID Lyn Records, MD  200 mg at 01/11/16 1206  . heparin ADULT infusion 100 units/mL (25000 units/250 mL) 700 Units/hr Intravenous Continuous Para March Batchelder, RPH    . ondansetron [facility]) injection 4 mg 4 mg Intravenous Q6H PRN Lora Paula, MD  4 mg at 01/10/16 1722  . pneumococcal 23 valent vaccine (PNU-IMMUNE) injection 0.5 mL 0.5 mL Intramuscular Tomorrow-1000 Levert Feinstein, MD    . sodium chloride flush (NS) 0.9 % injection 3 mL 3 mL Intravenous Q12H Kathleene Hazel, MD  3 mL at 01/11/16 1815  . sodium chloride flush (NS) 0.9 % injection 3 mL 3 mL Intravenous PRN Kathleene Hazel, MD      Allergies  Allergen Reactions  . Penicillins Cross Reactors Swelling and Other (See Comments)    Mouth Ulcers Has patient had a PCN reaction causing immediate rash, facial/tongue/throat swelling, SOB or lightheadedness with hypotension: Yes Has patient had a PCN reaction causing severe rash involving mucus membranes or skin necrosis: Yes Has patient had a PCN reaction that required hospitalization No Has patient had a PCN reaction occurring within the last 10 years: No If all of the above answers are  "NO", then may proceed with Cephalosporin use.       Review of Systems:  General:decreased appetite, decreased energy, no weight gain, no weight loss, no fever Cardiac:+ chest pain with exertion, + chest pain at rest, +SOB with exertion, + resting SOB, + PND, + orthopnea, + palpitations, + arrhythmia, + atrial fibrillation, + LE edema, no dizzy spells, no syncope Respiratory:+ shortness of breath, no home oxygen, + productive cough, no dry cough, + bronchitis, no wheezing, no hemoptysis, no asthma, no pain with inspiration or cough, no sleep apnea, no CPAP at night GI:no difficulty swallowing, no reflux, no frequent heartburn, no hiatal hernia, no abdominal pain, no constipation, no diarrhea, no hematochezia, no hematemesis, no melena GU:no dysuria, no frequency, no urinary tract infection, no hematuria, no kidney stones, no kidney disease Vascular:no pain suggestive of claudication, no pain in feet, no leg cramps, no varicose veins, no DVT, no non-healing foot ulcer Neuro:no stroke, no TIA's, no seizures, no headaches, no temporary blindness one eye, no slurred speech, no peripheral neuropathy, no chronic pain, no instability of gait, no memory/cognitive dysfunction Musculoskeletal:no arthritis, no joint swelling, no myalgias, no difficulty walking, normal mobility  Skin:no rash, no itching, no skin infections, no pressure sores or  ulcerations Psych:no anxiety, no depression, + nervousness, no unusual recent stress Eyes:+ blurry vision, no floaters, no recent vision changes, does not wear glasses or  contacts ENT:no hearing loss, no loose or painful teeth, no dentures, last saw dentist many years ago Hematologic:no easy bruising, no abnormal bleeding, no clotting disorder, no frequent epistaxis Endocrine:no diabetes, does not check CBG's at home   Physical Exam:  BP 135/62 mmHg  Pulse 64  Temp(Src) 97.5 F (36.4 C) (Oral)  Resp 20  Ht 5\' 3"  (1.6 m)  Wt 112 lb 11.2 oz (51.12 kg)  BMI 19.97 kg/m2  SpO2 94%  LMP 12/12/2015 General:Thin, well-appearing HEENT:Unremarkable  Neck:no JVD, no bruits, no adenopathy  Chest:clear to auscultation, symmetrical breath sounds, no wheezes, no rhonchi  CV:RRR, grade IV/VI systolic and diastolic murmurs  Abdomen:soft, non-tender, no masses  Extremities:warm, well-perfused, pulses palpable, trace lower extremity edema Rectal/GUDeferred Neuro:Grossly non-focal and symmetrical throughout Skin:Clean and dry, no rashes, no breakdown  Diagnostic Tests:  Transthoracic Echocardiography  (Report amended )  Patient: Voncille, Simm MR #: [id number] Study Date: 11/05/2015 Gender: F Age: 62 Height: 160 cm Weight: 44.2 kg BSA: 1.39 m^2 Pt. Status: Room: 3W06C  SONOGRAPHER Nolon Rod, RDCS PERFORMING [facility], Inpatient ATTENDING Ashley, Courteney Lyn ADMITTING Erlinda Hong Irven Coe, Marquita Palms  cc:  ------------------------------------------------------------------- LV  EF: 50% - 55%  ------------------------------------------------------------------- Indications: Dyspnea 786.09.  ------------------------------------------------------------------- History: PMH: Aortic valve disease. Mitral valve disease. Risk factors: Former tobacco use.  ------------------------------------------------------------------- Study Conclusions  - Left ventricle: The cavity size was mildly dilated. There was  mild concentric hypertrophy. Systolic function was mildly  reduced. The estimated ejection fraction was in the range of 50%  to 55%. Diffuse hypokinesis. - Aortic valve: There appears to be sub-valvular calcification in  the LVOT that could be causing sub-valvular stenosis.  Trileaflet; mildly thickened, moderately calcified leaflets,  mostly the left coronary cusp. Transvalvular velocity was  increased. There was moderate stenosis. There was moderate to  severe regurgitation. Peak velocity (S): 350 cm/s. Mean gradient  (S): 26 mm Hg. Valve area (VTI): 1.14 cm^2. Valve area (Vmax):  1.02 cm^2. Valve area (Vmean): 1.09 cm^2. Regurgitation pressure  half-time: 392 ms. - Mitral valve: Calcified annulus. Transvalvular velocity was  within the normal range. There was no evidence for stenosis.  There was trivial regurgitation. Valve area by continuity  equation (using LVOT flow): 2.24 cm^2. - Left atrium: The atrium was severely dilated. - Right ventricle: The cavity size was normal. Wall thickness was  normal. Systolic function was normal. - Right atrium: The atrium was moderately dilated. Central venous  pressure (est): 15 mm Hg. - Atrial septum: A patent foramen ovale cannot be excluded. - Tricuspid valve: There was mild-moderate regurgitation. - Pulmonic valve: There was mild regurgitation. - Pulmonary arteries: Systolic pressure was moderately increased.  PA peak pressure: 50 mm Hg (S). - Inferior vena cava: The vessel was  dilated. The respirophasic  diameter changes were blunted (< 50%), consistent with elevated  central venous pressure.  Impressions:  - No ASD, PFO or VSD closure devide was noted. There is a  calcified, sub-aortic lesion in the LVOT that is likely causing  sub-valvular stenosis. There is clearly a gradient consistent  with moderate stenosis, though it cannot be determined from this  echo how much of the gradient is from the aortic valve vs. the  subvalvular calcification. There is an echodensity in the main  pulmonary artery that is likely acoustic shadow from the cacified  aortic  valve and subvalvular apparatus. However, thrombus cannot  be excluded. If there is suspicion for pulmonary embolism,  consider further evaluation.  Transthoracic echocardiography. M-mode, complete 2D, spectral Doppler, and color Doppler. Birthdate: Patient birthdate: [birth year]. Age: Patient is 45 yr old. Sex: Gender: female. BMI: 17.3 kg/m^2. Blood pressure: 110/74 Patient status: Inpatient. Study date: Study date: 11/05/2015. Study time: 10:26 AM. Location: Bedside.  -------------------------------------------------------------------  ------------------------------------------------------------------- Left ventricle: The cavity size was mildly dilated. There was mild concentric hypertrophy. Systolic function was mildly reduced. The estimated ejection fraction was in the range of 50% to 55%. Diffuse hypokinesis. The study was not technically sufficient to allow evaluation of LV diastolic dysfunction due to atrial fibrillation.  ------------------------------------------------------------------- Aortic valve: There appears to be sub-valvular calcification in the LVOT that could be causing sub-valvular stenosis. Trileaflet; mildly thickened, moderately calcified leaflets, mostly the left coronary cusp. Mobility was not restricted. Doppler: Transvalvular velocity  was increased. There was moderate stenosis. There was moderate to severe regurgitation. VTI ratio of LVOT to aortic valve: 0.28. Valve area (VTI): 1.14 cm^2. Indexed valve area (VTI): 0.82 cm^2/m^2. Peak velocity ratio of LVOT to aortic valve: 0.25. Valve area (Vmax): 1.02 cm^2. Indexed valve area (Vmax): 0.73 cm^2/m^2. Mean velocity ratio of LVOT to aortic valve: 0.26. Valve area (Vmean): 1.09 cm^2. Indexed valve area (Vmean): 0.78 cm^2/m^2.  Mean gradient (S): 26 mm Hg. Peak gradient (S): 49 mm Hg.  ------------------------------------------------------------------- Aorta: Aortic root: The aortic root was normal in size.  ------------------------------------------------------------------- Mitral valve: Calcified annulus. Mobility was not restricted. Doppler: Transvalvular velocity was within the normal range. There was no evidence for stenosis. There was trivial regurgitation. Valve area by pressure half-time: 2.82 cm^2. Indexed valve area by pressure half-time: 2.03 cm^2/m^2. Valve area by continuity equation (using LVOT flow): 2.24 cm^2. Indexed valve area by continuity equation (using LVOT flow): 1.61 cm^2/m^2. Mean gradient (D): 3 mm Hg. Peak gradient (D): 6 mm Hg.  ------------------------------------------------------------------- Left atrium: The atrium was severely dilated.  ------------------------------------------------------------------- Atrial septum: A patent foramen ovale cannot be excluded.  ------------------------------------------------------------------- Right ventricle: The cavity size was normal. Wall thickness was normal. Systolic function was normal.  ------------------------------------------------------------------- Pulmonic valve: Structurally normal valve. Cusp separation was normal. Doppler: Transvalvular velocity was within the normal range. There was no evidence for stenosis. There was  mild regurgitation.  ------------------------------------------------------------------- Tricuspid valve: Structurally normal valve. Doppler: Transvalvular velocity was within the normal range. There was mild-moderate regurgitation.  ------------------------------------------------------------------- Pulmonary artery: The main pulmonary artery was normal-sized. Systolic pressure was moderately increased.  ------------------------------------------------------------------- Right atrium: The atrium was moderately dilated.  ------------------------------------------------------------------- Pericardium: There was no pericardial effusion.  ------------------------------------------------------------------- Systemic veins: Inferior vena cava: The vessel was dilated. The respirophasic diameter changes were blunted (< 50%), consistent with elevated central venous pressure.  ------------------------------------------------------------------- Measurements  Left ventricle Value Reference LV ID, ED, PLAX chordal 43.8 mm 43 - 52 LV ID, ES, PLAX chordal 35.5 mm 23 - 38 LV fx shortening, PLAX chordal (L) 19 % >=29 LV PW thickness, ED 12.3 mm --------- IVS/LV PW ratio, ED 1 <=1.3 Stroke volume, 2D 82 ml --------- Stroke volume/bsa, 2D 59 ml/m^2 --------- LV ejection fraction, 1-p A4C 52 % --------- LV end-diastolic volume, 2-p 137 ml --------- LV end-systolic volume, 2-p 62 ml --------- LV ejection fraction, 2-p 55 % --------- Stroke volume, 2-p 76 ml --------- LV end-diastolic volume/bsa, 2-p 98 ml/m^2  --------- LV end-systolic volume/bsa, 2-p 44 ml/m^2 --------- Stroke volume/bsa, 2-p 54.2 ml/m^2 ---------  Ventricular septum Value Reference IVS thickness, ED 12.3 mm ---------  LVOT Value Reference LVOT ID, S 23 mm --------- LVOT area 4.15 cm^2 --------- LVOT peak velocity, S 86.3 cm/s --------- LVOT mean velocity, S 61.1 cm/s --------- LVOT VTI, S 19.8 cm ---------  Aortic valve Value Reference Aortic valve peak velocity, S 350 cm/s --------- Aortic valve mean velocity, S 232 cm/s --------- Aortic valve VTI, S 71.8 cm --------- Aortic mean gradient, S 26 mm Hg --------- Aortic peak gradient, S 49 mm Hg --------- VTI ratio, LVOT/AV 0.28 --------- Aortic valve area, VTI 1.14 cm^2 --------- Aortic valve area/bsa, VTI 0.82 cm^2/m^2 --------- Velocity ratio, peak, LVOT/AV 0.25 --------- Aortic valve area, peak velocity 1.02 cm^2 --------- Aortic valve area/bsa, peak 0.73 cm^2/m^2 --------- velocity Velocity ratio, mean, LVOT/AV 0.26 --------- Aortic valve area, mean velocity 1.09 cm^2 --------- Aortic valve area/bsa, mean 0.78 cm^2/m^2 --------- velocity Aortic regurg pressure half-time 392 ms ---------  Aorta Value Reference Aortic root ID,  ED 30 mm ---------  Left atrium Value Reference LA ID, A-P, ES 52 mm --------- LA ID/bsa, A-P (H) 3.73 cm/m^2 <=2.2 LA volume, S 120 ml --------- LA volume/bsa, S 86.2 ml/m^2 --------- LA volume, ES, 1-p A4C 98.6 ml --------- LA volume/bsa, ES, 1-p A4C 70.8 ml/m^2 --------- LA volume, ES, 1-p A2C 140 ml --------- LA volume/bsa, ES, 1-p A2C 100.6 ml/m^2 ---------  Mitral valve Value Reference Mitral E-wave peak velocity 118 cm/s --------- Mitral mean velocity, D 68.6 cm/s --------- Mitral deceleration time (L) 116 ms 150 - 230 Mitral pressure half-time 76 ms --------- Mitral mean gradient, D 3 mm Hg --------- Mitral peak gradient, D 6 mm Hg --------- Mitral valve area, PHT, DP 2.82 cm^2 --------- Mitral valve area/bsa, PHT, DP 2.03 cm^2/m^2 --------- Mitral valve area, LVOT 2.24 cm^2 --------- continuity Mitral valve area/bsa, LVOT 1.61 cm^2/m^2 --------- continuity Mitral annulus VTI, D 36.7 cm ---------  Pulmonary arteries Value Reference PA pressure, S, DP (H) 50 mm Hg <=30  Tricuspid valve Value Reference Tricuspid regurg peak velocity 296 cm/s --------- Tricuspid peak RV-RA gradient 35 mm Hg ---------  Systemic veins Value  Reference Estimated CVP 15 mm Hg ---------  Right ventricle Value Reference RV pressure, S, DP (H) 50 mm Hg <=30  Legend: (L) and (H) mark values outside specified reference range.  ------------------------------------------------------------------- Hart Carwin, MD [id number]:[id number]:13       Transesophageal Echocardiography  Patient: Niasia, Lennox MR #: [id number] Study Date: 11/09/2015 Gender: F Age: 45 Height: 160 cm Weight: 44.5 kg BSA: 1.4 m^2 Pt. Status: Room: 3W06C  ATTENDING Daiva Eves, Remi Haggard N SONOGRAPHER Delcie Roch, RDCS, CCT ORDERING Tobias Alexander, M.D. PERFORMING Tobias Alexander, M.D. ADMITTING Tyson Alias  cc:  ------------------------------------------------------------------- LV EF: 40% - 45%  ------------------------------------------------------------------- Indications: Atrial fibrillation - 427.31.  ------------------------------------------------------------------- Study Conclusions  - Left ventricle: Systolic function was mildly to moderately  reduced. The estimated ejection fraction was in the range of 40%  to 45%. Diffuse hypokinesis. - Aortic valve: There is thickening and calcification of the left  coronary cusp. There is also a calcified subaortic membrane seen  about 0.5 cm bellow the oartic valve in the LVOT. The transaortic  gradeints were not calculated on the current study.  The leaflets don&'t coapt well and there is severe aortic  regurgitation. - Left atrium: The atrium was severely dilated. No evidence of  thrombus in the atrial cavity or appendage. No evidence of  thrombus in the atrial cavity or appendage. No evidence of  thrombus in the appendage. - Right atrium: No evidence of thrombus in  the  atrial cavity or  appendage. - Tricuspid valve: There was mild-moderate regurgitation. - Pulmonic valve: No evidence of vegetation. There was moderate  regurgitation.  Diagnostic transesophageal echocardiography. 2D and color Doppler. Birthdate: Patient birthdate: 03/03/1971. Age: Patient is 45 yr old. Sex: Gender: female. BMI: 17.4 kg/m^2. Blood pressure:  113/74 Patient status: Inpatient. Study date: Study date: 11/09/2015. Study time: 09:12 AM. Location: Endoscopy.  -------------------------------------------------------------------  ------------------------------------------------------------------- Left ventricle: Systolic function was mildly to moderately reduced. The estimated ejection fraction was in the range of 40% to 45%. Diffuse hypokinesis.  ------------------------------------------------------------------- Aortic valve: There is thickening and calcification of the left coronary cusp. There is also a calcified subaortic membrane seen about 0.5 cm bellow the oartic valve in the LVOT. The transaortic gradeints were not calculated on the current study. The leaflets don&'t coapt well and there is severe aortic regurgitation. Structurally normal valve. Trileaflet; normal thickness leaflets. Cusp separation was normal. Doppler: There was no significant regurgitation.  ------------------------------------------------------------------- Aorta: The aorta was normal, not dilated, and non-diseased. There was no atheroma. There was no evidence for dissection. Aortic root: The aortic root was not dilated. Ascending aorta: The ascending aorta was normal in size. Aortic arch: The aortic arch was normal in size. Descending aorta: The descending aorta was normal in size.  ------------------------------------------------------------------- Mitral valve: Structurally normal valve. Leaflet separation was normal. Doppler: There was no significant  regurgitation.  ------------------------------------------------------------------- Left atrium: The atrium was severely dilated. No evidence of thrombus in the atrial cavity or appendage. No evidence of thrombus in the atrial cavity or appendage. No evidence of thrombus in the appendage. The appendage was morphologically a left appendage, multilobulated, and of normal size. Emptying velocity was decreased.  ------------------------------------------------------------------- Right ventricle: The cavity size was normal. Wall thickness was normal. Systolic function was normal.  ------------------------------------------------------------------- Pulmonic valve: Structurally normal valve. Cusp separation was normal. No evidence of vegetation. Doppler: There was moderate regurgitation.  ------------------------------------------------------------------- Tricuspid valve: Structurally normal valve. Leaflet separation was normal. Doppler: There was mild-moderate regurgitation.  ------------------------------------------------------------------- Pulmonary artery: The main pulmonary artery was normal-sized.  ------------------------------------------------------------------- Right atrium: The atrium was normal in size. No evidence of thrombus in the atrial cavity or appendage. The appendage was morphologically a right appendage.  ------------------------------------------------------------------- Pericardium: There was no pericardial effusion.  ------------------------------------------------------------------- Post procedure conclusions Ascending Aorta:  - The aorta was normal, not dilated, and non-diseased.  ------------------------------------------------------------------- Prepared and Electronically Authenticated by  Tobias Alexander, M.D. 2017-03-01T11:25:06    CARDIAC CATHETERIZATION Procedures    Right/Left Heart Cath and Coronary  Angiography    Conclusion    1. No angiographic evidence of CAD 2. Elevated right sided pressures.   Recommendations: Further workup in regards to her aortic valve disease per cardiology consult team.     Indications    Aortic valve disease [I35.9 (ICD-10-CM)]    Technique and Indications    Estimated blood loss <50 mL. Indication: Acute CHF, severe aortic valve insufficiency, moderate AS  Procedure: The risks, benefits, complications, treatment options, and expected outcomes were discussed with the patient. The patient and/or family concurred with the proposed plan, giving informed consent. The patient was brought to the cath lab after IV hydration was begun and oral premedication was given. The patient was further sedated with Versed and Fentanyl. There was an IV present in the right antecubital vein. This area was prepped and draped. I then changed this out for a 5 [place] sheath. Right heart catheterization performed with a balloon tipped catheter. The right wrist was assessed with  a modified Allens test which was positive. The right wrist was prepped and draped in a sterile fashion. 1% lidocaine was used for local anesthesia. Using the modified Seldinger access technique, a 5 French sheath was placed in the right radial artery. 3 mg Verapamil was given through the sheath. 3000 units IV heparin was given. Standard diagnostic catheters were used to perform selective coronary angiography. I engaged the RCA with a JR4 catheter and the left main with an EBU 3.0 guiding catheter. I crossed the aortic valve with an AL-1 catheter and straight wire. No LV gram performed. The sheath was removed from the right radial artery and a Terumo hemostasis band was applied at the arteriotomy site on the right wrist.   Sedation: During this procedure the patient is administered a total of Versed 3 mg and Fentanyl 25 mcg to achieve and maintain moderate conscious sedation. The patient's heart rate,  blood pressure, and oxygen saturation are monitored continuously during the procedure. The period of conscious sedation is 49 minutes, of which I was present face-to-face 100% of this time.    Coronary Findings    Dominance: Left   Left Anterior Descending  . Vessel is large. Vessel is angiographically normal.   . First Diagonal Branch   The vessel is large in size.   Marland Kitchen Second Diagonal Branch   The vessel is moderate in size.     Left Circumflex  . Vessel is large. Vessel is angiographically normal.   . First Obtuse Marginal Branch   The vessel is small in size.   . Third Obtuse Marginal Branch   The vessel is large in size.   Marland Kitchen Fourth Obtuse Marginal Branch   The vessel is small in size.     Right Coronary Artery  . Vessel is moderate in size. Vessel is angiographically normal.       Right Heart Pressures Hemodynamic findings consistent with severe pulmonary hypertension. Elevated LV EDP consistent with volume overload.    Coronary Diagrams    Diagnostic Diagram            Implants    No implant documentation for this case.    PACS Images    Show images for Cardiac catheterization     Link to Procedure Log    Procedure Log      Hemo Data       Most Recent Value   Fick Cardiac Output  7.02 L/min   Fick Cardiac Output Index  4.68 (L/min)/BSA   RA A Wave  -99 mmHg   RA V Wave  30 mmHg   RA Mean  25 mmHg   RV Systolic Pressure  94 mmHg   RV Diastolic Pressure  5 mmHg   RV EDP  23 mmHg   PA Systolic Pressure  90 mmHg   PA Diastolic Pressure  44 mmHg   PA Mean  60 mmHg   PW A Wave  -99 mmHg   PW V Wave  20 mmHg   PW Mean  17 mmHg   AO Systolic Pressure  127 mmHg   AO Diastolic Pressure  55 mmHg   AO Mean  80 mmHg   LV Systolic Pressure  142 mmHg   LV Diastolic Pressure  11  mmHg   LV EDP  21 mmHg   Arterial Occlusion Pressure Extended Systolic Pressure  124 mmHg   Arterial Occlusion Pressure Extended Diastolic Pressure  53 mmHg   Arterial Occlusion Pressure Extended Mean Pressure  78 mmHg  Left Ventricular Apex Extended Systolic Pressure  140 mmHg   Left Ventricular Apex Extended Diastolic Pressure  12 mmHg   Left Ventricular Apex Extended EDP Pressure  22 mmHg   QP/QS  1   TPVR Index  12.82 HRUI   TSVR Index  17.1 HRUI   PVR SVR Ratio  0.78   TPVR/TSVR Ratio  0.75        Impression:  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 She describes a long progression of symptoms of exertional shortness of breath, orthopnea, PND and LE edema consistent with chronic combined systolic and diastolic congestive heart failure with 2 recent hospitalizations for acute exacerbations of class IV symptoms associated with recurrent atrial fibrillation and chest pain. I have personally reviewed the patient's recent echocardiograms and diagnostic catheterization. Her aortic valve is tricuspid but severely insufficient. The aortic annulus is not dilated and in fact her aortic root may be relatively small. She has a clear subvalvular membrane causing significant stenosis. LV function is at least mildly reduced. There were no other significant abnormalities noted on either ECHO study, and RV function appeared normal. However, right heart cath was notable for severe pulmonary hypertension. O2 saturation data was not obtained at the time of cath to r/o shunt physiology.  The patient clearly will need AVR with resection of subvalvular aortic membrane. She would likely benefit from concomitant maze procedure. Prior to surgery she will need to undergo cardiac MRI/MRA or cardiac-gated CTA heart to r/o other anomalous conditions that  might explain her severe pulmonary hypertension, such as sinus venosus ASD and/or anomalous pulmonary venous return. Repeat right heart cath with O2 saturation run might need to be considered. Her CHF will need to be optimized medically and PFT's performed to assess for significant COPD. She needs a dental examination.   Plan:  The patient was counseled at length regarding treatment alternatives for management of severe aortic insufficiency with subvalvular stenosis including continued medical therapy versus proceeding with aortic valve replacement in the near future. The natural history of aortic insufficiency was reviewed, as was long term prognosis with medical therapy alone. Surgical options were discussed at length including conventional surgical aortic valve replacement through either a full median sternotomy or using minimally invasive techniques. Other alternatives including rapid-deployment bioprosthetic tissue valve replacement, transcatheter aortic valve replacement, patch enlargement of the aortic root, stentless porcine aortic root replacement, valve repair, the Ross autograft procedure, and homograft aortic root replacement were discussed. Discussion was held comparing the relative risks of mechanical valve replacement with need for lifelong anticoagulation versus use of a bioprosthetic tissue valve and the associated potential for late structural valve deterioration and failure. The relative risks and benefits of performing a maze procedure at the time of their surgery was discussed at length, including the expected likelihood of long term freedom from recurrent symptomatic atrial fibrillation and/or atrial flutter.   Will arrange for cardiac MRI/MRA or cardiac-gated CTA of the heart as soon as practical and ask for dental service consult. Will follow   I spent in excess of 120 minutes during the conduct of this hospital consultation and >50% of this time involved direct  face-to-face encounter for counseling and/or coordination of the patient's care.   Salvatore Decent. Cornelius Moras, MD 01/11/2016 6:53 PM           Patient Information    Patient Name Sex DOB SSN   Delayna, Sparlin Female 18-Apr-1971 [id number]  Consult Note by Purcell Nails, MD at 01/11/2016 6:52 PM    Author: Purcell Nails, MD Service: Cardiothoracic Surgery Author Type: Physician   Filed: 01/12/2016 9:56 AM Note Time: 01/11/2016 6:52 PM Status: Addendum   Editor: Purcell Nails, MD (Physician)     Related Notes: Original Note by Purcell Nails, MD (Physician) filed at 01/12/2016 9:53 AM   Expand All Collapse All       [address]  [place] [address]  [phone]    CARDIOTHORACIC SURGERY CONSULTATION REPORT  PCP is Pete Glatter, MD Referring Provider is Lyn Records, MD Primary Cardiologist is Tobias Alexander, MD  Reason for consultation: Aortic Insufficiency, Subaortic Stenosis, Recurrent Persistent Atrial Fibrillation  HPI:  Patient is a 45 year old female with history of congenital heart disease that has never previously been surgically corrected with been referred for surgical consultation to discuss treatment options for management of 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 The patient states that she has known that she has had a heart murmur all of her life and has been told that she had a "hole in her heart" She apparently underwent cardiac catheterization during her childhood and her mother was told that she needed cardiac surgery. According to the patient her mother insisted on postponing surgical intervention and treating her medically as long as possible. The patient moved to [place] within 20 years ago. She apparently underwent echocardiography and was evaluated by cardiologist when she was pregnant with her second child approximately 18 years ago. She carried her child to  term pregnancy and had an uncomplicated delivery. She never returned for medical follow-up.  The patient states that approximately 9 months ago she began to experience progressive symptoms of exertional shortness of breath Symptoms gradually got worse and became problematic with relatively mild physical activity and occasionally at rest. She developed symptoms of orthopnea, PND and lower extremity edema. He quit smoking approximately 4 months ago but shortness of breath continued to get worse. She was hospitalized acutely from 11/03/2015 through 11/08/2015 for community acquired pneumonia and rapid atrial fibrillation and atrial flutter. Transthoracic echocardiogram performed at that time revealed severe aortic insufficiency with subvalvar aortic stenosis. There was mild left ventricular systolic dysfunction with ejection fraction estimated 50-55%. There was trivial mitral regurgitation but severe left atrial enlargement. There was moderate right atrial enlargement and findings consistent with moderate pulmonary hypertension. The patient underwent TEE and was successfully cardioverted back to sinus rhythm. Transesophageal echocardiogram revealed diffuse "left ventricular systolic dysfunction with ejection fraction estimated 40% There was severe aortic insufficiency and subvalvar aortic membrane. She was discharged from the hospital and seen in follow-up by [place] on 12/22/2015.   The patient continued to experience symptoms of exertional shortness of breath, chest pain and a productive cough. She subsequently developed GI illness with diarrhea. She was readmitted to the hospital 01/06/2016 with recurrent persistent atrial fibrillation associated with rapid ventricular response. She spontaneously converted back to sinus rhythm on intravenous amiodarone. Diagnostic cardiac catheterization revealed normal coronary artery anatomy with no significant coronary artery disease. She had severe pulmonary  hypertension with PA pressures measured 90/44 andpulmonary capillary wedge pressure reported 17 mmHg. Fick cardiac output was measured 7.02 L/m corresponding to cardiac index of 4.68. Formal oxygen saturation run was not performed to assess for possible shunt physiology. Cardiothoracic surgical consultation was requested.  The patient is single and lives locally in [place] with her brother. 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  Her mother passed away several years ago. She has to a adult children, the youngest of which is in college locally in [place]. He describes a long-standing history of exertional shortness of breath has progressed over the past year. In recent months she gets short of breath with low level physical activity and occasionally at rest She has occasional episodes of PND and orthopnea. She has had episodes of chest pain or chest tightness that have been exacerbated by tachycardia. She has not had dizzy spells or syncope. She has mild lower extremity edema. Since hospital admission her breathing has improved. She states that her diarrhea has improved and her appetite is slowly returning to normal.   Past Medical History  Diagnosis Date  . Aortic insufficiency     severe  . Subvalvar aortic stenosis   . Chronic combined systolic and diastolic congestive heart failure (HCC)   . Atrial fibrillation with rapid ventricular response (HCC) 01/08/2016  . Persistent atrial fibrillation (HCC) 11/03/2015  . CHD (congenital heart disease)     Past Surgical History  Procedure Laterality Date  . Cardiac catheterization  childhood  . Cesarean section    . Rectal surgery    . Tubal ligation    . Cardioversion N/A 11/09/2015    Procedure: CARDIOVERSION; Surgeon: Lars Masson, MD; Location: [facility] ENDOSCOPY; Service: Cardiovascular; Laterality: N/A;  . Tee without cardioversion N/A 11/09/2015     Procedure: TRANSESOPHAGEAL ECHOCARDIOGRAM (TEE); Surgeon: Lars Masson, MD; Location: [facility] ENDOSCOPY; Service: Cardiovascular; Laterality: N/A;  . Cardiac catheterization N/A 01/11/2016    Procedure: Right/Left Heart Cath and Coronary Angiography; Surgeon: Kathleene Hazel, MD; Location: [facility] INVASIVE CV LAB; Service: Cardiovascular; Laterality: N/A;    Family History  Problem Relation Age of Onset  . Heart disease Mother     "i think she died of heart issue"    Social History   Social History  . Marital Status: Single    Spouse Name: N/A  . Number of Children: N/A  . Years of Education: N/A   Occupational History  . Not on file.   Social History Main Topics  . Smoking status: Former Smoker -- 1.00 packs/day for 23 years    Types: Cigarettes    Quit date: 09/12/2015  . Smokeless tobacco: Not on file  . Alcohol Use: No     Comment: occasionally  . Drug Use: Yes    Special: Marijuana     Comment: occasionally  . Sexual Activity: Not on file   Other Topics Concern  . Not on file   Social History Narrative    Prior to Admission medications   Medication Sig Start Date End Date Taking? Authorizing Provider  furosemide (LASIX) 20 MG tablet Take 20 mg by mouth daily as needed. FOR SWELLING ONLY   Yes Historical Provider, MD  metoprolol succinate (TOPROL XL) 100 MG 24 hr tablet Take 1 tablet (100 mg total) by mouth daily. Take with or immediately following a meal. 12/12/15  Yes Dyann Kief, PA-C  rivaroxaban (XARELTO) 20 MG TABS tablet Take 1 tablet (20 mg total) by mouth daily with supper. Patient taking differently: Take 20 mg by mouth every evening.  01/02/16  Yes [place], NP  triamcinolone (KENALOG) 0.1 % paste Use as directed 1 application in the mouth or throat 2 (two) times daily. Patient not taking: Reported on 01/06/2016 11/16/15    Pete Glatter, MD    Current Facility-Administered Medications  Medication Dose Route Frequency Provider Last Rate Last  Dose  . 0.9 % sodium chloride infusion 250 mL Intravenous PRN Kathleene Hazel, MD    . acetaminophen (TYLENOL) tablet 650 mg 650 mg Oral Q4H PRN Lora Paula, MD    . amiodarone (PACERONE) tablet 200 mg 200 mg Oral BID Lyn Records, MD  200 mg at 01/11/16 1206  . heparin ADULT infusion 100 units/mL (25000 units/250 mL) 700 Units/hr Intravenous Continuous Para March Batchelder, RPH    . ondansetron [facility]) injection 4 mg 4 mg Intravenous Q6H PRN Lora Paula, MD  4 mg at 01/10/16 1722  . pneumococcal 23 valent vaccine (PNU-IMMUNE) injection 0.5 mL 0.5 mL Intramuscular Tomorrow-1000 Levert Feinstein, MD    . sodium chloride flush (NS) 0.9 % injection 3 mL 3 mL Intravenous Q12H Kathleene Hazel, MD  3 mL at 01/11/16 1815  . sodium chloride flush (NS) 0.9 % injection 3 mL 3 mL Intravenous PRN Kathleene Hazel, MD      Allergies  Allergen Reactions  . Penicillins Cross Reactors Swelling and Other (See Comments)    Mouth Ulcers Has patient had a PCN reaction causing immediate rash, facial/tongue/throat swelling, SOB or lightheadedness with hypotension: Yes Has patient had a PCN reaction causing severe rash involving mucus membranes or skin necrosis: Yes Has patient had a PCN reaction that required hospitalization No Has patient had a PCN reaction occurring within the last 10 years: No If all of the above answers are "NO", then may proceed with Cephalosporin use.       Review of Systems:  General:decreased appetite, decreased energy, no weight gain, no weight loss, no fever Cardiac:+ chest pain with exertion, + chest pain at rest, +SOB with exertion, + resting SOB, +  PND, + orthopnea, + palpitations, + arrhythmia, + atrial fibrillation, + LE edema, no dizzy spells, no syncope Respiratory:+ shortness of breath, no home oxygen, + productive cough, no dry cough, + bronchitis, no wheezing, no hemoptysis, no asthma, no pain with inspiration or cough, no sleep apnea, no CPAP at night GI:no difficulty swallowing, no reflux, no frequent heartburn, no hiatal hernia, no abdominal pain, no constipation, no diarrhea, no hematochezia, no hematemesis, no melena GU:no dysuria, no frequency, no urinary tract infection, no hematuria, no kidney stones, no kidney disease Vascular:no pain suggestive of claudication, no pain in feet, no leg cramps, no varicose veins, no DVT, no non-healing foot ulcer Neuro:no stroke, no TIA's, no seizures, no headaches, no temporary blindness one eye, no slurred speech, no peripheral neuropathy, no chronic pain, no instability of gait, no memory/cognitive dysfunction Musculoskeletal:no arthritis, no joint swelling, no myalgias, no difficulty walking, normal mobility  Skin:no rash, no itching, no skin infections, no pressure sores or ulcerations Psych:no anxiety, no depression, + nervousness, no unusual recent stress Eyes:+ blurry vision, no floaters, no recent vision changes, does not wear glasses or contacts ENT:no hearing loss, no loose or painful teeth, no dentures, last saw dentist many years ago Hematologic:no easy bruising, no abnormal bleeding, no clotting disorder, no frequent epistaxis Endocrine:no diabetes, does not check CBG's at  home   Physical Exam:  BP 135/62 mmHg  Pulse 64  Temp(Src) 97.5 F (36.4 C) (Oral)  Resp 20  Ht 5\' 3"  (1.6 m)  Wt 112 lb 11.2 oz (51.12 kg)  BMI 19.97 kg/m2  SpO2 94%  LMP 12/12/2015 General:Thin, well-appearing HEENT:Unremarkable  Neck:no JVD, no bruits, no adenopathy  Chest:clear to auscultation, symmetrical breath sounds, no wheezes, no rhonchi  CV:RRR, grade IV/VI systolic and diastolic murmurs  Abdomen:soft, non-tender, no masses  Extremities:warm, well-perfused, pulses palpable, trace lower extremity edema Rectal/GUDeferred Neuro:Grossly non-focal and symmetrical throughout Skin:Clean and dry, no rashes, no breakdown  Diagnostic Tests:  Transthoracic Echocardiography  (Report amended )  Patient: Zelena, Bushong MR #: [id number] Study Date: 11/05/2015 Gender: F Age: 23 Height: 160 cm Weight: 44.2 kg BSA: 1.39 m^2 Pt. Status: Room: 3W06C  SONOGRAPHER Nolon Rod, RDCS PERFORMING [facility], Inpatient ATTENDING Fulton, Courteney Lyn ADMITTING Erlinda Hong Irven Coe, Marquita Palms  cc:  ------------------------------------------------------------------- LV EF: 50% - 55%  ------------------------------------------------------------------- Indications: Dyspnea 786.09.  ------------------------------------------------------------------- History: PMH: Aortic valve disease. Mitral valve disease. Risk factors: Former tobacco  use.  ------------------------------------------------------------------- Study Conclusions  - Left ventricle: The cavity size was mildly dilated. There was  mild concentric hypertrophy. Systolic function was mildly  reduced. The estimated ejection fraction was in the range of 50%  to 55%. Diffuse hypokinesis. - Aortic valve: There appears to be sub-valvular calcification in  the LVOT that could be causing sub-valvular stenosis.  Trileaflet; mildly thickened, moderately calcified leaflets,  mostly the left coronary cusp. Transvalvular velocity was  increased. There was moderate stenosis. There was moderate to  severe regurgitation. Peak velocity (S): 350 cm/s. Mean gradient  (S): 26 mm Hg. Valve area (VTI): 1.14 cm^2. Valve area (Vmax):  1.02 cm^2. Valve area (Vmean): 1.09 cm^2. Regurgitation pressure  half-time: 392 ms. - Mitral valve: Calcified annulus. Transvalvular velocity was  within the normal range. There was no evidence for stenosis.  There was trivial regurgitation. Valve area by continuity  equation (using LVOT flow): 2.24 cm^2. - Left atrium: The atrium was severely dilated. - Right ventricle: The cavity size was normal. Wall thickness was  normal. Systolic function was normal. - Right atrium: The atrium was moderately dilated. Central venous  pressure (est): 15 mm Hg. - Atrial septum: A patent foramen ovale cannot be excluded. - Tricuspid valve: There was mild-moderate regurgitation. - Pulmonic valve: There was mild regurgitation. - Pulmonary arteries: Systolic pressure was moderately increased.  PA peak pressure: 50 mm Hg (S). - Inferior vena cava: The vessel was dilated. The respirophasic  diameter changes were blunted (< 50%), consistent with elevated  central venous pressure.  Impressions:  - No ASD, PFO or VSD closure devide was noted. There is a  calcified, sub-aortic lesion in the LVOT that is likely causing  sub-valvular stenosis.  There is clearly a gradient consistent  with moderate stenosis, though it cannot be determined from this  echo how much of the gradient is from the aortic valve vs. the  subvalvular calcification. There is an echodensity in the main  pulmonary artery that is likely acoustic shadow from the cacified  aortic valve and subvalvular apparatus. However, thrombus cannot  be excluded. If there is suspicion for pulmonary embolism,  consider further evaluation.  Transthoracic echocardiography. M-mode, complete 2D, spectral Doppler, and color Doppler. Birthdate: Patient birthdate: Feb 12, 1971. Age: Patient is 45 yr old. Sex: Gender: female. BMI: 17.3 kg/m^2. Blood pressure: 110/74 Patient status: Inpatient. Study date: Study date: 11/05/2015. Study time: 10:26 AM. Location: Bedside.  -------------------------------------------------------------------  ------------------------------------------------------------------- Left ventricle: The cavity size was mildly dilated. There was mild concentric hypertrophy. Systolic function was mildly reduced. The estimated ejection fraction was in the range of 50% to 55%. Diffuse hypokinesis. The study was not technically sufficient to allow evaluation of LV diastolic dysfunction due to atrial fibrillation.  ------------------------------------------------------------------- Aortic valve: There appears to be sub-valvular calcification in the LVOT that could be causing sub-valvular stenosis. Trileaflet;  mildly thickened, moderately calcified leaflets, mostly the left coronary cusp. Mobility was not restricted. Doppler: Transvalvular velocity was increased. There was moderate stenosis. There was moderate to severe regurgitation. VTI ratio of LVOT to aortic valve: 0.28. Valve area (VTI): 1.14 cm^2. Indexed valve area (VTI): 0.82 cm^2/m^2. Peak velocity ratio of LVOT to aortic valve: 0.25. Valve area (Vmax): 1.02 cm^2. Indexed  valve area (Vmax): 0.73 cm^2/m^2. Mean velocity ratio of LVOT to aortic valve: 0.26. Valve area (Vmean): 1.09 cm^2. Indexed valve area (Vmean): 0.78 cm^2/m^2.  Mean gradient (S): 26 mm Hg. Peak gradient (S): 49 mm Hg.  ------------------------------------------------------------------- Aorta: Aortic root: The aortic root was normal in size.  ------------------------------------------------------------------- Mitral valve: Calcified annulus. Mobility was not restricted. Doppler: Transvalvular velocity was within the normal range. There was no evidence for stenosis. There was trivial regurgitation. Valve area by pressure half-time: 2.82 cm^2. Indexed valve area by pressure half-time: 2.03 cm^2/m^2. Valve area by continuity equation (using LVOT flow): 2.24 cm^2. Indexed valve area by continuity equation (using LVOT flow): 1.61 cm^2/m^2. Mean gradient (D): 3 mm Hg. Peak gradient (D): 6 mm Hg.  ------------------------------------------------------------------- Left atrium: The atrium was severely dilated.  ------------------------------------------------------------------- Atrial septum: A patent foramen ovale cannot be excluded.  ------------------------------------------------------------------- Right ventricle: The cavity size was normal. Wall thickness was normal. Systolic function was normal.  ------------------------------------------------------------------- Pulmonic valve: Structurally normal valve. Cusp separation was normal. Doppler: Transvalvular velocity was within the normal range. There was no evidence for stenosis. There was mild regurgitation.  ------------------------------------------------------------------- Tricuspid valve: Structurally normal valve. Doppler: Transvalvular velocity was within the normal range. There was mild-moderate regurgitation.  ------------------------------------------------------------------- Pulmonary  artery: The main pulmonary artery was normal-sized. Systolic pressure was moderately increased.  ------------------------------------------------------------------- Right atrium: The atrium was moderately dilated.  ------------------------------------------------------------------- Pericardium: There was no pericardial effusion.  ------------------------------------------------------------------- Systemic veins: Inferior vena cava: The vessel was dilated. The respirophasic diameter changes were blunted (< 50%), consistent with elevated central venous pressure.  ------------------------------------------------------------------- Measurements  Left ventricle Value Reference LV ID, ED, PLAX chordal 43.8 mm 43 - 52 LV ID, ES, PLAX chordal 35.5 mm 23 - 38 LV fx shortening, PLAX chordal (L) 19 % >=29 LV PW thickness, ED 12.3 mm --------- IVS/LV PW ratio, ED 1 <=1.3 Stroke volume, 2D 82 ml --------- Stroke volume/bsa, 2D 59 ml/m^2 --------- LV ejection fraction, 1-p A4C 52 % --------- LV end-diastolic volume, 2-p 137 ml --------- LV end-systolic volume, 2-p 62 ml --------- LV ejection fraction, 2-p 55 % --------- Stroke volume, 2-p 76 ml --------- LV end-diastolic volume/bsa, 2-p 98 ml/m^2 --------- LV end-systolic volume/bsa, 2-p 44 ml/m^2 --------- Stroke volume/bsa, 2-p 54.2 ml/m^2 ---------  Ventricular septum Value Reference IVS thickness, ED 12.3 mm  ---------  LVOT Value Reference LVOT ID, S 23 mm --------- LVOT area 4.15 cm^2 --------- LVOT peak velocity, S 86.3 cm/s --------- LVOT mean velocity, S 61.1 cm/s --------- LVOT VTI, S 19.8 cm ---------  Aortic valve Value Reference Aortic valve peak velocity, S 350 cm/s --------- Aortic valve mean velocity, S 232 cm/s --------- Aortic valve VTI, S 71.8 cm --------- Aortic mean gradient, S 26 mm Hg --------- Aortic peak gradient, S 49 mm Hg --------- VTI ratio, LVOT/AV 0.28 --------- Aortic valve area, VTI 1.14 cm^2 --------- Aortic valve area/bsa, VTI 0.82 cm^2/m^2 --------- Velocity ratio, peak, LVOT/AV 0.25 --------- Aortic valve area, peak velocity 1.02 cm^2 --------- Aortic valve area/bsa, peak 0.73 cm^2/m^2 --------- velocity Velocity ratio, mean, LVOT/AV 0.26 --------- Aortic valve area, mean velocity 1.09 cm^2 --------- Aortic valve area/bsa, mean  0.78 cm^2/m^2 --------- velocity Aortic regurg pressure half-time 392 ms ---------  Aorta Value Reference Aortic root ID, ED 30 mm ---------  Left atrium Value Reference LA ID, A-P, ES 52 mm --------- LA ID/bsa, A-P (H) 3.73 cm/m^2 <=2.2 LA volume,  S 120 ml --------- LA volume/bsa, S 86.2 ml/m^2 --------- LA volume, ES, 1-p A4C 98.6 ml --------- LA volume/bsa, ES, 1-p A4C 70.8 ml/m^2 --------- LA volume, ES, 1-p A2C 140 ml --------- LA volume/bsa, ES, 1-p A2C 100.6 ml/m^2 ---------  Mitral valve Value Reference Mitral E-wave peak velocity 118 cm/s --------- Mitral mean velocity, D 68.6 cm/s --------- Mitral deceleration time (L) 116 ms 150 - 230 Mitral pressure half-time 76 ms --------- Mitral mean gradient, D 3 mm Hg --------- Mitral peak gradient, D 6 mm Hg --------- Mitral valve area, PHT, DP 2.82 cm^2 --------- Mitral valve area/bsa, PHT, DP 2.03 cm^2/m^2 --------- Mitral valve area, LVOT 2.24 cm^2 --------- continuity Mitral valve area/bsa, LVOT 1.61 cm^2/m^2 --------- continuity Mitral annulus VTI, D 36.7 cm ---------  Pulmonary arteries Value Reference PA pressure, S, DP (H) 50 mm Hg <=30  Tricuspid valve Value Reference Tricuspid regurg peak velocity 296 cm/s --------- Tricuspid peak RV-RA gradient 35 mm Hg ---------  Systemic veins Value Reference Estimated CVP 15 mm Hg ---------  Right ventricle Value Reference RV pressure, S, DP (H) 50 mm Hg <=30  Legend: (L) and (H) mark values outside specified  reference range.  ------------------------------------------------------------------- Hart Carwin, MD 2017-02-25T13:52:13       Transesophageal Echocardiography  Patient: Lenaya, Pietsch MR #: [id number] Study Date: 11/09/2015 Gender: F Age: 30 Height: 160 cm Weight: 44.5 kg BSA: 1.4 m^2 Pt. Status: Room: 3W06C  ATTENDING Daiva Eves, Remi Haggard N SONOGRAPHER Delcie Roch, RDCS, CCT ORDERING Tobias Alexander, M.D. PERFORMING Tobias Alexander, M.D. ADMITTING Tyson Alias  cc:  ------------------------------------------------------------------- LV EF: 40% - 45%  ------------------------------------------------------------------- Indications: Atrial fibrillation - 427.31.  ------------------------------------------------------------------- Study Conclusions  - Left ventricle: Systolic function was mildly to moderately  reduced. The estimated ejection fraction was in the range of 40%  to 45%. Diffuse hypokinesis. - Aortic valve: There is thickening and calcification of the left  coronary cusp. There is also a calcified subaortic membrane seen  about 0.5 cm bellow the oartic valve in the LVOT. The transaortic  gradeints were not calculated on the current study.  The leaflets don&'t coapt well and there is severe aortic  regurgitation. - Left atrium: The atrium was severely dilated. No evidence of  thrombus in the atrial cavity or appendage. No evidence of  thrombus in the atrial cavity or appendage. No evidence of  thrombus in the appendage. - Right atrium: No evidence of thrombus in the atrial cavity or  appendage. - Tricuspid valve: There was mild-moderate regurgitation. - Pulmonic valve: No evidence of vegetation. There was moderate  regurgitation.  Diagnostic transesophageal echocardiography. 2D and color Doppler. Birthdate: Patient  birthdate: December 29, 1970. Age: Patient is 46 yr old. Sex: Gender: female. BMI: 17.4 kg/m^2. Blood pressure:  113/74 Patient status: Inpatient. Study date: Study date: 11/09/2015. Study time: 09:12 AM. Location: Endoscopy.  -------------------------------------------------------------------  ------------------------------------------------------------------- Left ventricle: Systolic function was mildly to moderately reduced. The estimated ejection fraction was in the range of 40% to 45%. Diffuse hypokinesis.  ------------------------------------------------------------------- Aortic valve: There is thickening and calcification of the left coronary cusp. There is also a calcified subaortic membrane seen about 0.5 cm bellow the oartic valve in the LVOT. The transaortic gradeints were not calculated on the current study.  The leaflets don&'t coapt well and there is severe aortic regurgitation. Structurally normal valve. Trileaflet; normal thickness leaflets. Cusp separation was normal. Doppler: There was no significant regurgitation.  ------------------------------------------------------------------- Aorta: The aorta was normal, not dilated, and non-diseased. There was no atheroma. There was no evidence for dissection. Aortic root: The aortic root was not dilated. Ascending aorta: The ascending aorta was normal in size. Aortic arch: The aortic arch was normal in size. Descending aorta: The descending aorta was normal in size.  ------------------------------------------------------------------- Mitral valve: Structurally normal valve. Leaflet separation was normal. Doppler: There was no significant regurgitation.  ------------------------------------------------------------------- Left atrium: The atrium was severely dilated. No evidence of thrombus in the atrial cavity or appendage. No evidence of thrombus in the atrial cavity or appendage. No  evidence of thrombus in the appendage. The appendage was morphologically a left appendage, multilobulated, and of normal size. Emptying velocity was decreased.  ------------------------------------------------------------------- Right ventricle: The cavity size was normal. Wall thickness was normal. Systolic function was normal.  ------------------------------------------------------------------- Pulmonic valve: Structurally normal valve. Cusp separation was normal. No evidence of vegetation. Doppler: There was moderate regurgitation.  ------------------------------------------------------------------- Tricuspid valve: Structurally normal valve. Leaflet separation was normal. Doppler: There was mild-moderate regurgitation.  ------------------------------------------------------------------- Pulmonary artery: The main pulmonary artery was normal-sized.  ------------------------------------------------------------------- Right atrium: The atrium was normal in size. No evidence of thrombus in the atrial cavity or appendage. The appendage was morphologically a right appendage.  ------------------------------------------------------------------- Pericardium: There was no pericardial effusion.  ------------------------------------------------------------------- Post procedure conclusions Ascending Aorta:  - The aorta was normal, not dilated, and non-diseased.  ------------------------------------------------------------------- Prepared and Electronically Authenticated by  Tobias Alexander, M.D. 2017-03-01T11:25:06    CARDIAC CATHETERIZATION Procedures    Right/Left Heart Cath and Coronary Angiography    Conclusion    1. No angiographic evidence of CAD 2. Elevated right sided pressures.   Recommendations: Further workup in regards to her aortic valve disease per cardiology consult team.     Indications    Aortic valve  disease [I35.9 (ICD-10-CM)]    Technique and Indications    Estimated blood loss <50 mL. Indication: Acute CHF, severe aortic valve insufficiency, moderate AS  Procedure: The risks, benefits, complications, treatment options, and expected outcomes were discussed with the patient. The patient and/or family concurred with the proposed plan, giving informed consent. The patient was brought to the cath lab after IV hydration was begun and oral premedication was given. The patient was further sedated with Versed and Fentanyl. There was an IV present in the right antecubital vein. This area was prepped and draped. I then changed this out for a 5 [place] sheath. Right heart catheterization performed with a balloon tipped catheter. The right wrist was assessed with a modified Allens test which was positive. The right wrist was prepped and draped in a sterile fashion. 1% lidocaine was used for local anesthesia. Using the modified Seldinger access technique, a 5 French sheath was placed in the right radial artery. 3 mg Verapamil was given through the sheath. 3000 units IV heparin was given. Standard diagnostic catheters were used to perform selective coronary angiography. I engaged the RCA with a JR4 catheter and the left main with an EBU 3.0 guiding catheter. I crossed the aortic valve with an AL-1 catheter and straight wire. No LV gram performed. The sheath was removed from the right radial artery and a Terumo hemostasis band was applied at the arteriotomy site on the right wrist.   Sedation: During this procedure the patient is administered a total of  Versed 3 mg and Fentanyl 25 mcg to achieve and maintain moderate conscious sedation. The patient's heart rate, blood pressure, and oxygen saturation are monitored continuously during the procedure. The period of conscious sedation is 49 minutes, of which I was present face-to-face 100% of this time.    Coronary Findings    Dominance: Left   Left  Anterior Descending  . Vessel is large. Vessel is angiographically normal.   . First Diagonal Branch   The vessel is large in size.   Marland Kitchen Second Diagonal Branch   The vessel is moderate in size.     Left Circumflex  . Vessel is large. Vessel is angiographically normal.   . First Obtuse Marginal Branch   The vessel is small in size.   . Third Obtuse Marginal Branch   The vessel is large in size.   Marland Kitchen Fourth Obtuse Marginal Branch   The vessel is small in size.     Right Coronary Artery  . Vessel is moderate in size. Vessel is angiographically normal.       Right Heart Pressures Hemodynamic findings consistent with severe pulmonary hypertension. Elevated LV EDP consistent with volume overload.    Coronary Diagrams    Diagnostic Diagram            Implants    No implant documentation for this case.    PACS Images    Show images for Cardiac catheterization     Link to Procedure Log    Procedure Log      Hemo Data       Most Recent Value   Fick Cardiac Output  7.02 L/min   Fick Cardiac Output Index  4.68 (L/min)/BSA   RA A Wave  -99 mmHg   RA V Wave  30 mmHg   RA Mean  25 mmHg   RV Systolic Pressure  94 mmHg   RV Diastolic Pressure  5 mmHg   RV EDP  23 mmHg   PA Systolic Pressure  90 mmHg   PA Diastolic Pressure  44 mmHg   PA Mean  60 mmHg   PW A Wave  -99 mmHg   PW V Wave  20 mmHg   PW Mean  17 mmHg   AO Systolic Pressure  127 mmHg   AO Diastolic Pressure  55 mmHg   AO Mean  80 mmHg   LV Systolic Pressure  142 mmHg   LV Diastolic Pressure  11 mmHg   LV EDP  21 mmHg   Arterial Occlusion Pressure Extended Systolic Pressure  124 mmHg   Arterial Occlusion Pressure Extended Diastolic Pressure  53 mmHg   Arterial Occlusion Pressure Extended Mean Pressure  78 mmHg    Left Ventricular Apex Extended Systolic Pressure  140 mmHg   Left Ventricular Apex Extended Diastolic Pressure  12 mmHg   Left Ventricular Apex Extended EDP Pressure  22 mmHg   QP/QS  1   TPVR Index  12.82 HRUI   TSVR Index  17.1 HRUI   PVR SVR Ratio  0.78   TPVR/TSVR Ratio  0.75        Impression:  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 She describes a long progression of symptoms of exertional shortness of breath, orthopnea, PND and LE edema consistent with chronic combined systolic and diastolic congestive heart failure with 2 recent hospitalizations for acute exacerbations of class IV symptoms associated with recurrent atrial fibrillation and chest  pain. I have personally reviewed the patient's recent echocardiograms and diagnostic catheterization. Her aortic valve is tricuspid but severely insufficient. The aortic annulus is not dilated and in fact her aortic root may be relatively small. She has a clear subvalvular membrane causing significant stenosis. LV function is at least mildly reduced. There were no other significant abnormalities noted on either ECHO study, and RV function appeared normal. However, right heart cath was notable for severe pulmonary hypertension. O2 saturation data was not obtained at the time of cath to r/o shunt physiology.  The patient clearly will need AVR with resection of subvalvular aortic membrane. She would likely benefit from concomitant maze procedure. Prior to surgery she will need to undergo cardiac MRI/MRA or cardiac-gated CTA heart to r/o other anomalous conditions that might explain her severe pulmonary hypertension, such as sinus venosus ASD and/or anomalous pulmonary venous return. Repeat right heart cath with O2 saturation run might need to be considered. Her CHF will need to be optimized medically and PFT's  performed to assess for significant COPD. She needs a dental examination.   Plan:  The patient was counseled at length regarding treatment alternatives for management of severe aortic insufficiency with subvalvular stenosis including continued medical therapy versus proceeding with aortic valve replacement in the near future. The natural history of aortic insufficiency was reviewed, as was long term prognosis with medical therapy alone. Surgical options were discussed at length including conventional surgical aortic valve replacement through either a full median sternotomy or using minimally invasive techniques. Other alternatives including rapid-deployment bioprosthetic tissue valve replacement, transcatheter aortic valve replacement, patch enlargement of the aortic root, stentless porcine aortic root replacement, valve repair, the Ross autograft procedure, and homograft aortic root replacement were discussed. Discussion was held comparing the relative risks of mechanical valve replacement with need for lifelong anticoagulation versus use of a bioprosthetic tissue valve and the associated potential for late structural valve deterioration and failure. The relative risks and benefits of performing a maze procedure at the time of their surgery was discussed at length, including the expected likelihood of long term freedom from recurrent symptomatic atrial fibrillation and/or atrial flutter.   Will arrange for cardiac MRI/MRA or cardiac-gated CTA of the heart as soon as practical and ask for dental service consult. Will follow   I spent in excess of 120 minutes during the conduct of this hospital consultation and >50% of this time involved direct face-to-face encounter for counseling and/or coordination of the patient's care.   Salvatore Decent. Cornelius Moras, MD 01/11/2016 6:53 PM

## 2016-01-25 NOTE — Anesthesia Procedure Notes (Signed)
Procedure Name: Intubation Date/Time: 01/25/2016 9:00 AM Performed by: Fransisca Kaufmann Pre-anesthesia Checklist: Patient identified, Emergency Drugs available, Suction available and Patient being monitored Patient Re-evaluated:Patient Re-evaluated prior to inductionOxygen Delivery Method: Circle System Utilized Preoxygenation: Pre-oxygenation with 100% oxygen Intubation Type: IV induction Ventilation: Mask ventilation without difficulty Laryngoscope Size: Miller and 2 Grade View: Grade I Nasal Tubes: Left, Nasal Rae and Nasal prep performed Tube size: 6.5 mm Number of attempts: 1 Placement Confirmation: ETT inserted through vocal cords under direct vision,  positive ETCO2 and breath sounds checked- equal and bilateral Tube secured with: Tape Dental Injury: Teeth and Oropharynx as per pre-operative assessment

## 2016-01-25 NOTE — Hospital Discharge Follow-Up (Signed)
The patient is known  to the [facility] and has been followed in the [facility].  She has an appointment scheduled for 02/02/16 @ 0930 and the information has been placed on the AVS.

## 2016-01-25 NOTE — Progress Notes (Addendum)
PRE-OPERATIVE NOTE:  01/25/2016 Sheila Branch [id number]  VITALS: BP 134/40 mmHg  Pulse 68  Temp(Src) 97.8 F (36.6 C)  Resp 18  Ht 5' 3.5" (1.613 m)  Wt 92 lb (41.731 kg)  BMI 16.04 kg/m2  SpO2 97%  LMP 12/12/2015  Lab Results  Component Value Date   WBC 6.9 01/15/2016   HGB 11.5* 01/15/2016   HCT 36.3 01/15/2016   MCV 92.4 01/15/2016   PLT 185 01/15/2016   BMET    Component Value Date/Time   NA 137 01/14/2016 0322   K 4.6 01/14/2016 0322   CL 97* 01/14/2016 0322   CO2 30 01/14/2016 0322   GLUCOSE 91 01/14/2016 0322   BUN 18 01/14/2016 0322   CREATININE 0.87 01/14/2016 0322   CREATININE 0.71 01/04/2016 1349   CALCIUM 8.7* 01/14/2016 0322   GFRNONAA >60 01/14/2016 0322   GFRAA >60 01/14/2016 0322    Lab Results  Component Value Date   INR 1.12 01/25/2016   INR 1.41 01/04/2016   INR 1.14 11/03/2015   No results found for: PTT   Sheila Branch presents for dental procedures in the operating room.   SUBJECTIVE: The patient denies any acute medical or dental changes and agrees to proceed with treatment as planned with general anesthesia.  EXAM: No sign of acute dental changes.  ASSESSMENT: Patient is affected by chronic apical periodontitis, retained roots, dental caries, chronic periodontitis, and accretions.  PLAN: Patient agrees to proceed with treatment as planned in the operating room as previously discussed and accepts the risks, benefits, and complications of the proposed treatment. Patient is aware of the risk for bleeding, bruising, swelling, infection, pain, nerve damage, sinus involvement, root tip fracture, mandible fracture, and the risks of complications associated with the anesthesia. Patient also is aware of the potential for other complications up to and including death.   Charlynne Pander, DDS

## 2016-01-25 NOTE — Op Note (Signed)
OPERATIVE REPORT  Patient:            Sheila Branch Date of Birth:  1971/08/02 MRN:                [id number]   DATE OF PROCEDURE:  01/25/2016  PREOPERATIVE DIAGNOSES: 1. Severe aortic insufficiency 2. Pre-heart valve surgery dental protocol 3. Chronic apical periodontitis 4. Retained root segments 5. Dental caries 6. Chronic periodontitis 7. Accretions  POSTOPERATIVE DIAGNOSES: 1. Severe aortic insufficiency 2. Pre-heart valve surgery dental protocol 3. Chronic apical periodontitis 4. Retained root segments 5. Dental caries 6. Chronic periodontitis 7. Accretions  OPERATIONS: 1. Multiple extraction of tooth numbers 4, 5, 16, 17, 18, 30, and 31  2. 3 Quadrants of alveoloplasty 3. Gross debridement of remaining dentition   SURGEON: Charlynne Pander, DDS  ASSISTANT: Rory Percy, (dental assistant)  ANESTHESIA: General anesthesia via nasoendotracheal tube.  MEDICATIONS: 1. Clindamycin 600 mg IV prior to invasive dental procedures. 2. Local anesthesia with a total utilization of 4 carpules each containing 34 mg of lidocaine with 0.017 mg of epinephrine as well as 2 carpules each containing 9 mg of bupivacaine with 0.009 mg of epinephrine.  SPECIMENS: There are 7 teeth that were discarded.  DRAINS: None  CULTURES: None  COMPLICATIONS: None   ESTIMATED BLOOD LOSS: 100 mLs.  INTRAVENOUS FLUIDS: 1000 mLs of Lactated ringers solution.  INDICATIONS: The patient was recently diagnosed with severe aortic insufficiency.  A dental consultation was then requested to to evaluate poor dentition.  The patient was examined and treatment planned for multiple extractions with alveoloplasty and gross debridement of remaining dentition in the operating room with general anesthesia.  This treatment plan was formulated to decrease the risks and complications associated with dental infection from affecting the patient's systemic health and the anticipated heart valve  surgery.  OPERATIVE FINDINGS: Patient was examined operating room number 9.  The teeth were identified for extraction. The patient was noted to be affected by chronic periodontitis, accretions, chronic apical periodontitis, multiple retained root segments, and dental caries.  DESCRIPTION OF PROCEDURE: Patient was brought to the main operating room number 9. Patient was then placed in the supine position on the operating table. General anesthesia was then induced per the anesthesia team. The patient was then prepped and draped in the usual manner for dental medicine procedure. A timeout was performed. The patient was identified and procedures were verified. A throat pack was placed at this time. The oral cavity was then thoroughly examined with the findings noted above. The patient was then ready for the dental medicine procedure as follows:  Local anesthesia was then administered sequentially with a total utilization of 4 carpules each containing 34 mg of lidocaine with 0.017 mg of epinephrine as well as 2 carpules  each containing 9 mg bupivacaine with 0.009 mg of epinephrine.  The Maxillary left and right quadrants first approached. Anesthesia was then delivered utilizing infiltration with lidocaine with epinephrine. A #15 blade incision was then made from the distal of #3 and extended to the mesial #6.  A  surgical flap was then carefully reflected.  Tooth numbers 4 and 5 are then subluxated with a series of straight elevators. Tooth numbers 4 and 5 are then removed with a 150 forceps without complications. Alveoloplasty was then performed utilizing a rongeur and bone file to assist in obtaining primary closure. The surgical site was irrigated with copious amounts of sterile saline. A piece of Surgicel was placed in the extraction sockets appropriately.  A piece of Surgifoam was then placed over the surgical site from 4 through 5. The surgical site was then closed from the mesial of #3 and extended to  the distal of #6 utilizing 3-0 chromic gut suture in a continuous interrupted suture technique 1.     Tooth #16 was then approached. A cryers elevator was used to remove retained root segment area #16 without complications. The socket was curetted and compressed appropriately. The surgical site was irrigated with copious amounts of sterile saline. A piece of Surgicel was placed in the extraction socket. The surgical site was then closed from the maxillary left tuberosity and extended to the distal of #15 utilizing 3-0 chromic gut suture in a continuous interrupted suture technique 1. At this point in time, the remaining maxillary teeth were approached. A sonic scaler was used to remove significant accretions. A series of hand curettes were then used to further remove accretions. A sonic scaler was then again used to refine the removal of accretions.    At this point in time, the mandibular quadrants were approached. The patient was given bilateral inferior alveolar nerve blocks and long buccal nerve blocks utilizing the bupivacaine with epinephrine. Further infiltration was then achieved utilizing the lidocaine with epinephrine. A 15 blade incision was then made from the distal of number 32 and extended to the mesial of #29.  A surgical flap was then carefully reflected. The retained root segment #31 was then removed with a rongeur. Tooth #30 was then subluxated with a series straight elevators. Tooth #31 was then removed with a 23 forceps without complications. Alveoloplasty was then performed utilizing a rongeur and bone file to assist in obtaining primary closure. The surgical site was irrigated with copious amounts of sterile saline. A piece of Surgicel was placed in the extraction socket appropriately. The surgical site was then closed from the distal of #32 and extended to the distal of #29 utilizing 3-0 chromic gut suture in a continuous interrupted suture technique 1.  The mandibular left surgical  site was then approached. A 15 blade incision was then made from the distal of #17 and extended to the mesial of #19.  A surgical flap was then carefully reflected. Appropriate amounts of buccal and interseptal bone were then removed utilizing a surgical handpiece and copious amount of sterile water around tooth #17. Tooth number 17 and 18 were then subluxated with a series of straight elevators. The retained roots in the area of #18 were then removed with a rongeur without complications. Tooth #17 was then removed with a 23 forceps without complications. Alveoloplasty was then performed utilizing a rongeurs and bone file in the area numbers 17 and 18 to assist in achieving primary closure. The tissues were approximated and trimmed appropriately. The surgical sites were then irrigated with copious amounts of sterile saline.  A piece of Surgicel was then placed in each extraction socket appropriately.  The mandibular left surgical site was then closed from the distal of #17 and extended to the distal of #19 utilizing 3-0 chromic gut suture in a continuous interrupted suture technique 1.  The mandibular teeth were then approached.  A sonic scaler was used to remove significant accretions. A series of hand curettes were then utilized to further refine removal of accretions. A sonic scaler was then again used to further refine removal of accretions as needed. Patient ideally will need additional periodontal therapy and dental restorations in the future with a primary dentist of her choice after she is  medically stable from the anticipated heart valve surgery.    At this point time, the entire mouth was irrigated with copious amounts of sterile saline. The patient was examined for complications, seeing none, the dental medicine procedure was deemed to be complete. The throat pack was removed at this time. An oral airway was then placed at the request of the anesthesia team. A series of 4 x 4 gauze  Moistened with  Amicar 5% rinse were placed in the mouth to aid hemostasis. The patient was then handed over to the anesthesia team for final disposition. After an appropriate amount of time, the patient was extubated and taken to the postanesthsia care unit in good condition. All counts were correct for the dental medicine procedure.  The patient is to continue the Amicar 5% oral rinse post operatively. Patient is to rinse with 10 mls every hour for the next 10 hours. Patient is to use in a swish and spit manner. Patient is to restart Xarelto tomorrow evening. (01/26/2016).   Charlynne Pander, DDS.

## 2016-01-25 NOTE — Transfer of Care (Signed)
Immediate Anesthesia Transfer of Care Note  Patient: Sheila Branch  Procedure(s) Performed: Procedure(s): Extraction of tooth #'s [phone] with alveoloplasty and gross debridement of remaining teeth. (N/A)  Patient Location: PACU  Anesthesia Type:General  Level of Consciousness: awake, alert , oriented and sedated  Airway & Oxygen Therapy: Patient Spontanous Breathing and Patient connected to face mask oxygen  Post-op Assessment: Report given to RN, Post -op Vital signs reviewed and stable and Patient moving all extremities  Post vital signs: Reviewed and stable  Last Vitals:  Filed Vitals:   01/25/16 0650  BP: 134/40  Pulse: 68  Temp: 36.6 C  Resp: 18    Last Pain: There were no vitals filed for this visit.    Patients Stated Pain Goal: 2 (01/25/16 0737)  Complications: No apparent anesthesia complications

## 2016-01-26 ENCOUNTER — Telehealth: Payer: Self-pay

## 2016-01-26 ENCOUNTER — Telehealth (HOSPITAL_COMMUNITY): Payer: Self-pay | Admitting: Dentistry

## 2016-01-26 ENCOUNTER — Encounter (HOSPITAL_COMMUNITY): Payer: Self-pay | Admitting: Dentistry

## 2016-01-26 NOTE — Telephone Encounter (Signed)
This Case Manager placed call to patient to check status as patient s/p multiple teeth extractions with alveoplasty on 01/25/16. Call placed to #[phone]; unable to reach patient. HIPPA compliant message left requesting return call.

## 2016-01-26 NOTE — Telephone Encounter (Signed)
01/26/2016  Patient:            Sheila Branch Date of Birth:  1971-07-01 MRN:                [id number]   I called the patient at home as part of a follow-up after surgery yesterday. Patient is doing well with minimal discomfort. Patient denies having any active bleeding. Patient to restart Xarelto therapy this evening. Patient to use salt water rinses as needed to aid healing.  Patient to call if problems arise before seeing patient for suture removal on 02/07/2016. Patient reminded of appointment with Dr. Cornelius Moras on 01/30/2016. AVR will be scheduled at that time. Dr. Kristin Bruins

## 2016-01-27 ENCOUNTER — Telehealth: Payer: Self-pay

## 2016-01-27 NOTE — Telephone Encounter (Signed)
This Case Manager placed call to patient to check on status; patient s/p multiple teeth extractions with alveoplasty on 01/25/16. Patient denied active bleeding but indicated she had some swelling and was uncertain if she should continue "icing" area.  This Case Manager indicated she should contact Dr. Kristin Bruins for clarification. Patient verbalized understanding. Instructed patient to do salt water rinses to aide healing. Patient verbalized understanding. Patient aware of appointment on 01/30/16 at 1100 with Dr. Cornelius Moras and appointment on 02/02/16 at 0930 with Dr. Venetia Night. Patient indicated she would have transportation to her appointment. She indicated she planned to walk-in on 02/03/16 to meet with Financial Counselor to see if clinic can be changed on [employer]. Patient currently has Family Medicine [address] on her [place] and now has a PCP at [facility]. No additional needs/concerns identified at this time.

## 2016-01-29 ENCOUNTER — Emergency Department (HOSPITAL_COMMUNITY)
Admission: EM | Admit: 2016-01-29 | Discharge: 2016-01-29 | Disposition: A | Discharge status: Home / Self Care | Payer: MEDICAID | Attending: Emergency Medicine | Admitting: Emergency Medicine

## 2016-01-29 ENCOUNTER — Encounter (HOSPITAL_COMMUNITY): Payer: Self-pay

## 2016-01-29 DIAGNOSIS — K91840 Postprocedural hemorrhage and hematoma of a digestive system organ or structure following a digestive system procedure: Secondary | ICD-10-CM | POA: Insufficient documentation

## 2016-01-29 DIAGNOSIS — Z79899 Other long term (current) drug therapy: Secondary | ICD-10-CM | POA: Insufficient documentation

## 2016-01-29 DIAGNOSIS — Q249 Congenital malformation of heart, unspecified: Secondary | ICD-10-CM | POA: Insufficient documentation

## 2016-01-29 DIAGNOSIS — Z8701 Personal history of pneumonia (recurrent): Secondary | ICD-10-CM | POA: Insufficient documentation

## 2016-01-29 DIAGNOSIS — K1379 Other lesions of oral mucosa: Secondary | ICD-10-CM

## 2016-01-29 DIAGNOSIS — Z7901 Long term (current) use of anticoagulants: Secondary | ICD-10-CM | POA: Insufficient documentation

## 2016-01-29 DIAGNOSIS — R011 Cardiac murmur, unspecified: Secondary | ICD-10-CM | POA: Insufficient documentation

## 2016-01-29 DIAGNOSIS — Z9889 Other specified postprocedural states: Secondary | ICD-10-CM | POA: Insufficient documentation

## 2016-01-29 DIAGNOSIS — Z87891 Personal history of nicotine dependence: Secondary | ICD-10-CM | POA: Insufficient documentation

## 2016-01-29 DIAGNOSIS — Z880 Allergy status to penicillin: Secondary | ICD-10-CM | POA: Insufficient documentation

## 2016-01-29 DIAGNOSIS — J449 Chronic obstructive pulmonary disease, unspecified: Secondary | ICD-10-CM | POA: Insufficient documentation

## 2016-01-29 DIAGNOSIS — I5042 Chronic combined systolic (congestive) and diastolic (congestive) heart failure: Secondary | ICD-10-CM | POA: Insufficient documentation

## 2016-01-29 DIAGNOSIS — Z8659 Personal history of other mental and behavioral disorders: Secondary | ICD-10-CM | POA: Insufficient documentation

## 2016-01-29 NOTE — ED Notes (Signed)
Patient comes from home after having oral surgery this past Wednesday.  Woke up this morning with discomfort in her jaw as well as bloody sputum.  States on her post-op discharge paper work was told to come to ED if dentist was closed and she had a problem.  Patient A&Ox4.  No bleeding noted as this time. Patient is on blood thinners.

## 2016-01-29 NOTE — Discharge Instructions (Signed)
Read the information below.   Discontinue Xarelto Be sure to call your dentist tomorrow and let them know you were in the ED for dental bleeding.  Let your cardiologist know you did not take your Xarelto today due to post-op dental bleeding. You may return to the Emergency Department at any time for worsening condition or any new symptoms that concern you. Return to the ED if bleeding restarts and won't stop or you develop shortness of breath, difficulty breathing, fatigue, lightheadedness, or dizziness.

## 2016-01-29 NOTE — ED Provider Notes (Signed)
CSN: [id number]     Arrival date & time 01/29/16  1642 History  By signing my name below, I, Evon Slack, attest that this documentation has been prepared under the direction and in the presence of [employer], PA-C. Electronically Signed: Evon Slack, ED Scribe. 01/29/2016. 5:53 PM.     Chief Complaint  Patient presents with  . Dental Pain  . Post-op Problem    Patient is a 45 y.o. female presenting with tooth pain. The history is provided by the patient and medical records. No language interpreter was used.  Dental Pain Associated symptoms: no fever and no neck pain    HPI Comments: Sheila Branch is a 46 y.o. female who presents to the Emergency Department complaining of dental bleeding today. Patient had several teeth extracted on Wednesday 01/25/16. She was restarted on her Xarelto on 01/26/16. Reports unremarkable post-operative period until this morning when she coughed up a "blood clot." States left lower posterior gums bleeding. She has tried gauze and salt water gargles with minimal relief. She is currently changing the guaze approximately every 15 minutes. She is unsure if sutures were placed. Denies fever, chills, nightsweats, SOB, trouble breathing, neck pain, dizziness or light headedness.   Past Medical History  Diagnosis Date  . Aortic insufficiency     severe  . Subvalvar aortic stenosis   . Chronic combined systolic and diastolic congestive heart failure (HCC)   . Atrial fibrillation with rapid ventricular response (HCC) 01/08/2016  . Persistent atrial fibrillation (HCC) 11/03/2015  . CHD (congenital heart disease)   . COPD (chronic obstructive pulmonary disease) (HCC)   . History of pneumonia   . Anxiety    Past Surgical History  Procedure Laterality Date  . Cardiac catheterization  childhood  . Cesarean section      pt. denies  . Rectal surgery    . Tubal ligation    . Cardioversion N/A 11/09/2015    Procedure: CARDIOVERSION;  Surgeon: Lars Masson, MD;  Location: [facility] ENDOSCOPY;  Service: Cardiovascular;  Laterality: N/A;  . Tee without cardioversion N/A 11/09/2015    Procedure: TRANSESOPHAGEAL ECHOCARDIOGRAM (TEE);  Surgeon: Lars Masson, MD;  Location: [facility] ENDOSCOPY;  Service: Cardiovascular;  Laterality: N/A;  . Cardiac catheterization N/A 01/11/2016    Procedure: Right/Left Heart Cath and Coronary Angiography;  Surgeon: Kathleene Hazel, MD;  Location: [facility] INVASIVE CV LAB;  Service: Cardiovascular;  Laterality: N/A;  . Multiple extractions with alveoloplasty N/A 01/25/2016    Procedure: Extraction of tooth #'s 4,5,16-18,30,31 with alveoloplasty and gross debridement of remaining teeth.;  Surgeon: Charlynne Pander, DDS;  Location: [facility] OR;  Service: Oral Surgery;  Laterality: N/A;  . Mouth surgery Bilateral 01-25-16   Family History  Problem Relation Age of Onset  . Heart disease Mother     "i think she died of heart issue"   Social History  Substance Use Topics  . Smoking status: Former Smoker -- 1.00 packs/day for 23 years    Types: Cigarettes    Quit date: 09/12/2015  . Smokeless tobacco: Never Used  . Alcohol Use: 0.0 oz/week    0 Standard drinks or equivalent per week     Comment: occasionally   OB History    No data available      Review of Systems  Constitutional: Negative for fever, chills and diaphoresis.  HENT: Positive for dental problem. Negative for trouble swallowing.   Respiratory: Negative for shortness of breath.   Cardiovascular: Negative for chest pain.  Musculoskeletal: Negative for neck pain and neck stiffness.  Skin: Positive for wound ( oral).  Neurological: Negative for dizziness, syncope and light-headedness.     Allergies  Penicillins cross reactors  Home Medications   Prior to Admission medications   Medication Sig Start Date End Date Taking? Authorizing Provider  amiodarone (PACERONE) 200 MG tablet Take 1 tablet (200 mg total) by mouth 2 (two) times daily. 01/15/16   Lora Paula, MD  furosemide (LASIX) 40 MG tablet Take 1 tablet (40 mg total) by mouth daily. 01/15/16   Lora Paula, MD  oxyCODONE-acetaminophen (PERCOCET) 5-325 MG tablet Take one or two tablets by mouth every 6 hours as needed for moderate to severe  pain. 01/25/16   Charlynne Pander, DDS  XARELTO 20 MG TABS tablet Take 20 mg by mouth.  01/20/16   Historical Provider, MD   BP 139/44 mmHg  Pulse 72  Temp(Src) 97.7 F (36.5 C) (Oral)  Resp 14  SpO2 99%  LMP 12/12/2015 (Approximate)   Physical Exam  Constitutional: She appears well-developed and well-nourished. No distress.  HENT:  Head: Normocephalic and atraumatic.  Mouth/Throat: Uvula is midline, oropharynx is clear and moist and mucous membranes are normal. No trismus in the jaw. No uvula swelling. No oropharyngeal exudate, posterior oropharyngeal edema, posterior oropharyngeal erythema or tonsillar abscesses.  Sutures noted at site of tooth #4, #5,#16, #30, #31 - well-healing with no redness, discharge, or bleeding noted.   Bleeding noted at site of tooth #18. Unable to ascertain if suture is in place. Unable to ascertain if suture in place at tooth site #17.     Eyes: Conjunctivae and EOM are normal. No scleral icterus.  Neck: Trachea normal, normal range of motion and phonation normal. Neck supple. No rigidity. No tracheal deviation and normal range of motion present.  Cardiovascular: Normal rate and regular rhythm.   Murmur ( significant harsh systolic murmur heard best in 2nd right ICS) heard. Pulmonary/Chest: Effort normal and breath sounds normal. No respiratory distress.  Musculoskeletal: Normal range of motion.  Neurological: She is alert. Coordination normal.  Skin: Skin is warm and dry. She is not diaphoretic.  Psychiatric: She has a normal mood and affect. Her behavior is normal.  Nursing note and vitals reviewed.   ED Course  Procedures (including critical care time) DIAGNOSTIC STUDIES: Oxygen Saturation is 100%  on RA, normal by my interpretation.    COORDINATION OF CARE: 5:50 PM-Discussed treatment plan with pt at bedside and pt agreed to plan.     Labs Review Labs Reviewed - No data to display  Imaging Review No results found.    EKG Interpretation None      MDM   Final diagnoses:  Oral bleeding   Patient is post-op day 4 following tooth extractions. Review of records indicate the following teeth were extracted: #4, #5, #16, #17, #18, #30, and #31. Bleeding noted at tooth location #18. Patient denies SOB, dizziness, lightheadedness, or chest pain. She is afebrile, non-toxic, and vital signs are stable. Discussed patient with Dr. Silverio Lay, who also saw patient. With recent surgery and use of anticoagulants, discussed with patient option of checking hgh/hct - patient politely declined at this time.   Surgifoam was applied to affected site. After 20 minutes, re-check revealed cessation of bleeding. Patient able to tolerate fluids without re-initiation of bleeding. Provided additional surgifoam for home if needed. D/c'd Xarelto. Encouraged follow up with dentist and cardiology. Return precautions provided. Patient voiced understanding and is agreeable.  I personally performed the services described in this documentation, which was scribed in my presence. The recorded information has been reviewed and is accurate.      Lona Kettle, PA-C 01/31/16 1421  Richardean Canal, MD 02/01/16 [phone]

## 2016-01-30 ENCOUNTER — Ambulatory Visit (INDEPENDENT_AMBULATORY_CARE_PROVIDER_SITE_OTHER): Payer: Self-pay | Admitting: Thoracic Surgery (Cardiothoracic Vascular Surgery)

## 2016-01-30 ENCOUNTER — Other Ambulatory Visit: Payer: Self-pay | Admitting: *Deleted

## 2016-01-30 ENCOUNTER — Encounter: Payer: Self-pay | Admitting: Thoracic Surgery (Cardiothoracic Vascular Surgery)

## 2016-01-30 VITALS — BP 138/60 | HR 70 | Resp 20 | Ht 63.5 in | Wt 92.0 lb

## 2016-01-30 DIAGNOSIS — I4819 Other persistent atrial fibrillation: Secondary | ICD-10-CM

## 2016-01-30 DIAGNOSIS — I350 Nonrheumatic aortic (valve) stenosis: Secondary | ICD-10-CM

## 2016-01-30 DIAGNOSIS — Q244 Congenital subaortic stenosis: Secondary | ICD-10-CM

## 2016-01-30 DIAGNOSIS — Q249 Congenital malformation of heart, unspecified: Secondary | ICD-10-CM

## 2016-01-30 DIAGNOSIS — I481 Persistent atrial fibrillation: Secondary | ICD-10-CM

## 2016-01-30 DIAGNOSIS — J449 Chronic obstructive pulmonary disease, unspecified: Secondary | ICD-10-CM

## 2016-01-30 DIAGNOSIS — I351 Nonrheumatic aortic (valve) insufficiency: Secondary | ICD-10-CM

## 2016-01-30 DIAGNOSIS — I4891 Unspecified atrial fibrillation: Secondary | ICD-10-CM

## 2016-01-30 NOTE — Patient Instructions (Addendum)
Patient has been instructed to stop taking Xarelto  Patient should continue taking all other medications without change through the day before surgery.  Patient should have nothing to eat or drink after midnight the night before surgery.  On the morning of surgery patient does not need to take any medications.

## 2016-01-30 NOTE — Progress Notes (Signed)
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  She was originally seen in consultation on 01/11/2016 during her recent hospitalization for an acute exacerbation of chronic combined systolic and diastolic congestive heart failure that was further complicated by an episode of colitis. Since hospital discharge the patient has done well. She underwent dental extraction last week by Dr. Robin Searing.  Over the weekend she developed bleeding from her mouth related to her dental extraction. She was seen in the emergency department where her mouth was packed with Surgicel. The patient was advised to stop taking Xarelto at that time.  She returns to our office for follow-up and states that she has not had any further bleeding. She otherwise has been doing well. Her breathing has been quite stable and noticeably much better than it was at the time of her recent hospital admission. She has not had fevers, chills, or productive cough. Her appetite is normalized and her bowel function is back to normal. She has not been smoking cigarettes.   Current Outpatient Prescriptions  Medication Sig Dispense Refill  . amiodarone (PACERONE) 200 MG tablet Take 1 tablet (200 mg total) by mouth 2 (two) times daily. 60 tablet 0  . furosemide (LASIX) 40 MG tablet Take 1 tablet (40 mg total) by mouth daily. 30 tablet 0  . oxyCODONE-acetaminophen (PERCOCET) 5-325 MG tablet Take one or two tablets by mouth every 6 hours as needed for moderate to severe  pain. 40 tablet 0  . XARELTO 20 MG TABS tablet Take 20 mg by mouth.   3   No current  facility-administered medications for this visit.      Physical Exam:   BP 138/60 mmHg  Pulse 70  Resp 20  Ht 5' 3.5" (1.613 m)  Wt 92 lb (41.731 kg)  BMI 16.04 kg/m2  SpO2 98%  LMP 12/12/2015 (Approximate)  General:  Thin but well-appearing  Chest:   Clear to auscultation  CV:   Regular rate and rhythm with prominent harsh systolic and hold diastolic murmurs  Incisions:  n/a  Abdomen:  Soft nontender  Extremities:  Warm and well-perfused  Diagnostic Tests:  n/a   Impression:  Patient has known congenital heart disease with stage D severe symptomatically aortic insufficiency and subvalvular aortic stenosis. She describes a long progression of symptoms of exertional shortness of breath, orthopnea, PND and lower extremity edema consistent with chronic combined systolic and diastolic congestive heart failure. She has had 2 recent hospitalizations for acute exacerbation of class IV symptoms, both of which included recurrent atrial fibrillation and chest pain. She has been doing well since her most recent hospital discharge and she returns to our office with hopes to proceed with definitive surgical intervention in the near future.    Plan:  The patient was again counseled at length regarding treatment alternatives for management of severe aortic insufficiency with subvalvular aortic stenosis. The indications, risks, potential benefits of aortic valve replacement with resection of subvalvar aortic stenosis had been discussed.   Surgical options were discussed at length including conventional surgical  aortic valve replacement through either a full median sternotomy or using minimally invasive techniques.  Other alternatives including rapid-deployment bioprosthetic tissue valve replacement, transcatheter aortic valve replacement, patch enlargement of the aortic root, stentless porcine aortic root replacement, valve repair, the Ross autograft procedure, and homograft aortic root replacement  were discussed.  Discussion was held comparing the relative risks of mechanical valve replacement with need for lifelong anticoagulation versus use of a bioprosthetic tissue valve and the associated potential for late structural valve deterioration and failure.  This discussion was placed in the context of the patient's particular circumstances, and as a result the patient specifically requests that their valve be replaced using a mechanical prosthetic valve.  The relative risks and benefits of performing a maze procedure at the time of their surgery was discussed at length, including the expected likelihood of long term freedom from recurrent symptomatic atrial fibrillation and/or atrial flutter.   The patient understands and accepts all potential associated risks of surgery including but not limited to risk of death, stroke, myocardial infarction, congestive heart failure, respiratory failure, renal failure, pneumonia, bleeding requiring blood transfusion and or reexploration, arrhythmia, heart block or bradycardia requiring permanent pacemaker, aortic dissection or other major vascular complication, pleural effusions or other delayed complications related to continued congestive heart failure, and other late complications related to valve replacement including structural valve deterioration and failure, thrombosis, endocarditis, or paravalvular leak.  We plan to proceed with surgery on Thursday, 02/09/2016. The patient has been instructed to remain off of Xarelto between now and the time of surgery.    I spent in excess of 15 minutes during the conduct of this office consultation and >50% of this time involved direct face-to-face encounter with the patient for counseling and/or coordination of their care.    Salvatore Decent. Cornelius Moras, MD 01/30/2016 11:33 AM

## 2016-01-31 NOTE — H&P (Signed)
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 The patient states that she has known that she has had a heart murmur all of her life and has been told that she had a "hole in her heart" She apparently underwent cardiac catheterization during her childhood and her mother was told that she needed cardiac surgery. According to the patient her mother insisted on postponing surgical intervention and treating her medically as long as possible. The patient moved to [place] within 20 years ago. She apparently underwent echocardiography and was evaluated by cardiologist when she was pregnant with her second child approximately 18 years ago. She carried her child to term pregnancy and had an uncomplicated delivery. She never returned for medical follow-up.  The patient states that approximately 9 months ago she began to experience progressive symptoms of exertional shortness of breath Symptoms gradually got worse and became problematic with relatively mild physical activity and occasionally at rest. She developed symptoms of orthopnea, PND and lower extremity edema. He quit smoking approximately 4 months ago but shortness of breath continued to get worse. She was hospitalized acutely from 11/03/2015 through 11/08/2015 for community acquired pneumonia and rapid atrial fibrillation and atrial flutter.  Transthoracic echocardiogram performed at that time revealed severe aortic insufficiency with subvalvar aortic stenosis. There was mild left ventricular systolic dysfunction with ejection fraction estimated 50-55%. There was trivial mitral regurgitation but severe left atrial enlargement. There was moderate right atrial enlargement and findings consistent with moderate pulmonary hypertension. The patient underwent TEE and was successfully cardioverted back to sinus rhythm. Transesophageal echocardiogram revealed diffuse "left ventricular systolic dysfunction with ejection fraction estimated 40% There was severe aortic insufficiency and subvalvar aortic membrane. She was discharged from the hospital and seen in follow-up by Nada Boozer on 12/22/2015.   The patient continued to experience symptoms of exertional shortness of breath, chest pain and a productive cough. She subsequently developed GI illness with diarrhea. She was readmitted to the hospital 01/06/2016 with recurrent persistent atrial fibrillation associated with rapid ventricular response. She spontaneously converted back to sinus rhythm on intravenous amiodarone. Diagnostic cardiac catheterization revealed normal coronary artery anatomy with no significant coronary artery disease. She had severe pulmonary hypertension with PA pressures measured 90/44 andpulmonary capillary wedge pressure reported 17 mmHg. Fick cardiac output was measured 7.02 L/m corresponding to cardiac index of 4.68. Formal oxygen saturation run was not performed to assess for possible shunt physiology. Cardiothoracic surgical consultation was requested.  The patient is single and lives locally in [place] with her brother. 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 Her mother passed away several years ago. She has to a adult children, the youngest of which is in college locally in [place]. He describes a long-standing history of exertional shortness of  breath has progressed over the past year. In recent months she gets short of breath with  low level physical activity and occasionally at rest She has occasional episodes of PND and orthopnea. She has had episodes of chest pain or chest tightness that have been exacerbated by tachycardia. She has not had dizzy spells or syncope. She has mild lower extremity edema. Since hospital admission her breathing has improved. She states that her diarrhea has improved and her appetite is slowly returning to normal.   Patient returns to the office today for follow-up of congenital heart disease with severe subvalvar aortic stenosis, severe aortic insufficiency, and recurrent persistent atrial fibrillation. She was originally seen in consultation on 01/11/2016 during her recent hospitalization for an acute exacerbation of chronic combined systolic and diastolic congestive heart failure that was further complicated by an episode of colitis. Since hospital discharge the patient has done well. She underwent dental extraction last week by Dr. Robin Searing. Over the weekend she developed bleeding from her mouth related to her dental extraction. She was seen in the emergency department where her mouth was packed with Surgicel. The patient was advised to stop taking Xarelto at that time. She returns to our office for follow-up and states that she has not had any further bleeding. She otherwise has been doing well. Her breathing has been quite stable and noticeably much better than it was at the time of her recent hospital admission. She has not had fevers, chills, or productive cough. Her appetite is normalized and her bowel function is back to normal. She has not been smoking cigarettes.  Past Medical History  Diagnosis Date  . Aortic insufficiency     severe  . Subvalvar aortic stenosis   . Chronic combined systolic and diastolic congestive heart failure (HCC)   . Atrial fibrillation with rapid ventricular response (HCC)  01/08/2016  . Persistent atrial fibrillation (HCC) 11/03/2015  . CHD (congenital heart disease)   . COPD (chronic obstructive pulmonary disease) (HCC)   . History of pneumonia   . Anxiety     Past Surgical History  Procedure Laterality Date  . Cardiac catheterization  childhood  . Cesarean section      pt. denies  . Rectal surgery    . Tubal ligation    . Cardioversion N/A 11/09/2015    Procedure: CARDIOVERSION;  Surgeon: Lars Masson, MD;  [facility] ENDOSCOPY;  Service: Cardiovascular;  Laterality: N/A;  . Tee without cardioversion N/A 11/09/2015    Procedure: TRANSESOPHAGEAL ECHOCARDIOGRAM (TEE);  Surgeon: Lars Masson, MD;  Location: [facility] ENDOSCOPY;  Service: Cardiovascular;  Laterality: N/A;  . Cardiac catheterization N/A 01/11/2016    Procedure: Right/Left Heart Cath and Coronary Angiography;  Surgeon: Kathleene Hazel, MD;  Location: [facility] INVASIVE CV LAB;  Service: Cardiovascular;  Laterality: N/A;  . Multiple extractions with alveoloplasty N/A 01/25/2016    Procedure: Extraction of tooth #'s 4,5,16-18,30,31 with alveoloplasty and gross debridement of remaining teeth.;  Surgeon: Charlynne Pander, DDS;  Location: [facility] OR;  Service: Oral Surgery;  Laterality: N/A;  . Mouth surgery Bilateral 01-25-16    Family History  Problem Relation Age of Onset  . Heart disease Mother     "i think she died of heart issue"    Social History Social History  Substance Use Topics  . Smoking status: Former Smoker -- 1.00 packs/day for 23 years    Types: Cigarettes    Quit date: 09/12/2015  . Smokeless tobacco: Never Used  . Alcohol Use: 0.0 oz/week    0 Standard drinks or equivalent per week     Comment: occasionally  Prior to Admission medications   Medication Sig Start Date End Date Taking? Authorizing Provider  amiodarone (PACERONE) 200 MG tablet Take 1 tablet (200 mg total) by mouth 2 (two) times daily. 01/15/16   Lora Paula, MD  furosemide (LASIX) 40 MG tablet Take 1  tablet (40 mg total) by mouth daily. 01/15/16   Lora Paula, MD  oxyCODONE-acetaminophen (PERCOCET) 5-325 MG tablet Take one or two tablets by mouth every 6 hours as needed for moderate to severe  pain. 01/25/16   Charlynne Pander, DDS  XARELTO 20 MG TABS tablet Take 20 mg by mouth.  01/20/16   Historical Provider, MD    Allergies  Allergen Reactions  . Penicillins Cross Reactors Swelling and Other (See Comments)    Mouth Ulcers Has patient had a PCN reaction causing immediate rash, facial/tongue/throat swelling, SOB or lightheadedness with hypotension: Yes Has patient had a PCN reaction causing severe rash involving mucus membranes or skin necrosis: Yes Has patient had a PCN reaction that required hospitalization No Has patient had a PCN reaction occurring within the last 10 years: No If all of the above answers are "NO", then may proceed with Cephalosporin use.       Review of Systems:  General:decreased appetite, decreased energy, no weight gain, no weight loss, no fever Cardiac:+ chest pain with exertion, + chest pain at rest, +SOB with exertion, + resting SOB, + PND, + orthopnea, + palpitations, + arrhythmia, + atrial fibrillation, + LE edema, no dizzy spells, no syncope Respiratory:+ shortness of breath, no home oxygen, + productive cough, no dry cough, + bronchitis, no wheezing, no hemoptysis, no asthma, no pain with inspiration or cough, no sleep apnea, no CPAP at night GI:no difficulty swallowing, no reflux, no frequent heartburn, no hiatal hernia, no abdominal pain, no constipation, no diarrhea, no hematochezia, no hematemesis, no melena GU:no dysuria, no frequency, no urinary tract infection, no hematuria, no kidney stones, no kidney disease Vascular:no pain  suggestive of claudication, no pain in feet, no leg cramps, no varicose veins, no DVT, no non-healing foot ulcer Neuro:no stroke, no TIA's, no seizures, no headaches, no temporary blindness one eye, no slurred speech, no peripheral neuropathy, no chronic pain, no instability of gait, no memory/cognitive dysfunction Musculoskeletal:no arthritis, no joint swelling, no myalgias, no difficulty walking, normal mobility  Skin:no rash, no itching, no skin infections, no pressure sores or ulcerations Psych:no anxiety, no depression, + nervousness, no unusual recent stress Eyes:+ blurry vision, no floaters, no recent vision changes, does not wear glasses or contacts ENT:no hearing loss, no loose or painful teeth, no dentures, last saw dentist many years ago Hematologic:no easy bruising, no abnormal bleeding, no clotting disorder, no frequent epistaxis Endocrine:no diabetes, does not check CBG's at home   Physical Exam:  BP 135/62 mmHg  Pulse 64  Temp(Src) 97.5 F (36.4 C) (Oral)  Resp 20  Ht 5\' 3"  (1.6 m)  Wt 112 lb 11.2 oz (51.12 kg)  BMI 19.97 kg/m2  SpO2 94%  LMP 12/12/2015 General:Thin, well-appearing HEENT:Unremarkable  Neck:no JVD, no bruits, no adenopathy  Chest:clear to auscultation, symmetrical breath sounds, no wheezes, no rhonchi  CV:RRR, grade IV/VI systolic and diastolic murmurs  Abdomen:soft, non-tender, no masses   Extremities:warm, well-perfused, pulses palpable, trace lower extremity edema Rectal/GUDeferred Neuro:Grossly non-focal and symmetrical throughout Skin:Clean and dry, no rashes, no breakdown  Diagnostic Tests:  Transthoracic Echocardiography  (Report amended )  Patient: Myana, Schlup MR #: [id number] Study Date: 11/05/2015 Gender: F Age: 76 Height:  160 cm Weight: 44.2 kg BSA: 1.39 m^2 Pt. Status: Room: 3W06C  SONOGRAPHER Nolon Rod, RDCS PERFORMING [facility], Inpatient ATTENDING [place], Courteney Lyn ADMITTING Erlinda Hong Irven Coe, Marquita Palms  cc:  ------------------------------------------------------------------- LV EF: 50% - 55%  ------------------------------------------------------------------- Indications: Dyspnea 786.09.  ------------------------------------------------------------------- History: PMH: Aortic valve disease. Mitral valve disease. Risk factors: Former tobacco use.  ------------------------------------------------------------------- Study Conclusions  - Left ventricle: The cavity size was mildly dilated. There was  mild concentric hypertrophy. Systolic function was mildly  reduced. The estimated ejection fraction was in the range of 50%  to 55%. Diffuse hypokinesis. - Aortic valve: There appears to be sub-valvular calcification in  the LVOT that could be causing sub-valvular stenosis.  Trileaflet; mildly thickened, moderately calcified leaflets,  mostly the left coronary cusp. Transvalvular velocity was  increased. There was moderate stenosis. There was moderate to  severe regurgitation. Peak velocity (S): 350 cm/s. Mean gradient  (S): 26 mm Hg. Valve area (VTI): 1.14 cm^2. Valve area (Vmax):   1.02 cm^2. Valve area (Vmean): 1.09 cm^2. Regurgitation pressure  half-time: 392 ms. - Mitral valve: Calcified annulus. Transvalvular velocity was  within the normal range. There was no evidence for stenosis.  There was trivial regurgitation. Valve area by continuity  equation (using LVOT flow): 2.24 cm^2. - Left atrium: The atrium was severely dilated. - Right ventricle: The cavity size was normal. Wall thickness was  normal. Systolic function was normal. - Right atrium: The atrium was moderately dilated. Central venous  pressure (est): 15 mm Hg. - Atrial septum: A patent foramen ovale cannot be excluded. - Tricuspid valve: There was mild-moderate regurgitation. - Pulmonic valve: There was mild regurgitation. - Pulmonary arteries: Systolic pressure was moderately increased.  PA peak pressure: 50 mm Hg (S). - Inferior vena cava: The vessel was dilated. The respirophasic  diameter changes were blunted (< 50%), consistent with elevated  central venous pressure.  Impressions:  - No ASD, PFO or VSD closure devide was noted. There is a  calcified, sub-aortic lesion in the LVOT that is likely causing  sub-valvular stenosis. There is clearly a gradient consistent  with moderate stenosis, though it cannot be determined from this  echo how much of the gradient is from the aortic valve vs. the  subvalvular calcification. There is an echodensity in the main  pulmonary artery that is likely acoustic shadow from the cacified  aortic valve and subvalvular apparatus. However, thrombus cannot  be excluded. If there is suspicion for pulmonary embolism,  consider further evaluation.  Transthoracic echocardiography. M-mode, complete 2D, spectral Doppler, and color Doppler. Birthdate: Patient birthdate: 1971/03/08. Age: Patient is 45 yr old. Sex: Gender: female. BMI: 17.3 kg/m^2. Blood pressure: 110/74 Patient status: Inpatient. Study date: Study date: 11/05/2015.  Study time: 10:26 AM. Location: Bedside.  -------------------------------------------------------------------  ------------------------------------------------------------------- Left ventricle: The cavity size was mildly dilated. There was mild concentric hypertrophy. Systolic function was mildly reduced. The estimated ejection fraction was in the range of 50% to 55%. Diffuse hypokinesis. The study was not technically sufficient to allow evaluation of LV diastolic dysfunction due to atrial fibrillation.  ------------------------------------------------------------------- Aortic valve: There appears to be sub-valvular calcification in the LVOT that could be causing sub-valvular stenosis. Trileaflet; mildly thickened, moderately calcified leaflets, mostly the left coronary cusp. Mobility was not restricted. Doppler: Transvalvular velocity was increased. There was moderate stenosis. There was moderate to severe regurgitation. VTI ratio of LVOT to aortic valve: 0.28. Valve area (VTI): 1.14 cm^2. Indexed valve area (VTI): 0.82 cm^2/m^2. Peak velocity ratio of  LVOT to aortic valve: 0.25. Valve area (Vmax): 1.02 cm^2. Indexed valve area (Vmax): 0.73 cm^2/m^2. Mean velocity ratio of LVOT to aortic valve: 0.26. Valve area (Vmean): 1.09 cm^2. Indexed valve area (Vmean): 0.78 cm^2/m^2.  Mean gradient (S): 26 mm Hg. Peak gradient (S): 49 mm Hg.  ------------------------------------------------------------------- Aorta: Aortic root: The aortic root was normal in size.  ------------------------------------------------------------------- Mitral valve: Calcified annulus. Mobility was not restricted. Doppler: Transvalvular velocity was within the normal range. There was no evidence for stenosis. There was trivial regurgitation. Valve area by pressure half-time: 2.82 cm^2. Indexed valve area by pressure half-time: 2.03 cm^2/m^2. Valve area by continuity equation (using LVOT  flow): 2.24 cm^2. Indexed valve area by continuity equation (using LVOT flow): 1.61 cm^2/m^2. Mean gradient (D): 3 mm Hg. Peak gradient (D): 6 mm Hg.  ------------------------------------------------------------------- Left atrium: The atrium was severely dilated.  ------------------------------------------------------------------- Atrial septum: A patent foramen ovale cannot be excluded.  ------------------------------------------------------------------- Right ventricle: The cavity size was normal. Wall thickness was normal. Systolic function was normal.  ------------------------------------------------------------------- Pulmonic valve: Structurally normal valve. Cusp separation was normal. Doppler: Transvalvular velocity was within the normal range. There was no evidence for stenosis. There was mild regurgitation.  ------------------------------------------------------------------- Tricuspid valve: Structurally normal valve. Doppler: Transvalvular velocity was within the normal range. There was mild-moderate regurgitation.  ------------------------------------------------------------------- Pulmonary artery: The main pulmonary artery was normal-sized. Systolic pressure was moderately increased.  ------------------------------------------------------------------- Right atrium: The atrium was moderately dilated.  ------------------------------------------------------------------- Pericardium: There was no pericardial effusion.  ------------------------------------------------------------------- Systemic veins: Inferior vena cava: The vessel was dilated. The respirophasic diameter changes were blunted (< 50%), consistent with elevated central venous pressure.  ------------------------------------------------------------------- Measurements  Left ventricle Value Reference LV ID, ED, PLAX  chordal 43.8 mm 43 - 52 LV ID, ES, PLAX chordal 35.5 mm 23 - 38 LV fx shortening, PLAX chordal (L) 19 % >=29 LV PW thickness, ED 12.3 mm --------- IVS/LV PW ratio, ED 1 <=1.3 Stroke volume, 2D 82 ml --------- Stroke volume/bsa, 2D 59 ml/m^2 --------- LV ejection fraction, 1-p A4C 52 % --------- LV end-diastolic volume, 2-p 137 ml --------- LV end-systolic volume, 2-p 62 ml --------- LV ejection fraction, 2-p 55 % --------- Stroke volume, 2-p 76 ml --------- LV end-diastolic volume/bsa, 2-p 98 ml/m^2 --------- LV end-systolic volume/bsa, 2-p 44 ml/m^2 --------- Stroke volume/bsa, 2-p 54.2 ml/m^2 ---------  Ventricular septum Value Reference IVS thickness, ED 12.3 mm ---------  LVOT Value Reference LVOT ID, S 23 mm --------- LVOT area 4.15 cm^2 --------- LVOT peak velocity, S 86.3 cm/s --------- LVOT mean velocity, S 61.1 cm/s --------- LVOT VTI, S 19.8 cm ---------  Aortic valve Value Reference Aortic valve peak velocity, S 350 cm/s --------- Aortic valve mean velocity, S 232 cm/s --------- Aortic valve VTI, S 71.8 cm --------- Aortic mean gradient, S 26 mm Hg --------- Aortic peak gradient,  S 49 mm Hg --------- VTI ratio, LVOT/AV 0.28 --------- Aortic valve area, VTI 1.14 cm^2 --------- Aortic valve area/bsa, VTI 0.82 cm^2/m^2 --------- Velocity ratio, peak, LVOT/AV 0.25 --------- Aortic valve area, peak velocity 1.02 cm^2 --------- Aortic valve area/bsa, peak 0.73 cm^2/m^2 --------- velocity Velocity ratio, mean, LVOT/AV 0.26 --------- Aortic valve area, mean velocity 1.09 cm^2 --------- Aortic valve area/bsa, mean 0.78 cm^2/m^2 --------- velocity Aortic regurg pressure half-time 392 ms ---------  Aorta Value Reference Aortic root ID, ED 30 mm ---------  Left atrium Value Reference LA ID, A-P, ES 52 mm --------- LA ID/bsa, A-P (H) 3.73 cm/m^2 <=2.2 LA volume, S 120 ml --------- LA volume/bsa, S 86.2 ml/m^2 ---------  LA volume, ES, 1-p A4C 98.6 ml --------- LA volume/bsa, ES, 1-p A4C 70.8 ml/m^2 --------- LA volume, ES, 1-p A2C 140 ml --------- LA volume/bsa, ES, 1-p A2C 100.6 ml/m^2 ---------  Mitral valve Value Reference Mitral E-wave peak velocity 118 cm/s --------- Mitral mean velocity, D 68.6 cm/s --------- Mitral deceleration time (L) 116 ms 150 - 230 Mitral pressure half-time 76 ms --------- Mitral mean gradient, D 3 mm Hg --------- Mitral  peak gradient, D 6 mm Hg --------- Mitral valve area, PHT, DP 2.82 cm^2 --------- Mitral valve area/bsa, PHT, DP 2.03 cm^2/m^2 --------- Mitral valve area, LVOT 2.24 cm^2 --------- continuity Mitral valve area/bsa, LVOT 1.61 cm^2/m^2 --------- continuity Mitral annulus VTI, D 36.7 cm ---------  Pulmonary arteries Value Reference PA pressure, S, DP (H) 50 mm Hg <=30  Tricuspid valve Value Reference Tricuspid regurg peak velocity 296 cm/s --------- Tricuspid peak RV-RA gradient 35 mm Hg ---------  Systemic veins Value Reference Estimated CVP 15 mm Hg ---------  Right ventricle Value Reference RV pressure, S, DP (H) 50 mm Hg <=30  Legend: (L) and (H) mark values outside specified reference range.  ------------------------------------------------------------------- Hart Carwin, MD [id number]:13       Transesophageal Echocardiography  Patient: Orian, Amberg MR #: [id number] Study Date: 11/09/2015 Gender: F Age: 34 Height: 160 cm Weight: 44.5 kg BSA: 1.4 m^2 Pt. Status: Room: 3W06C  ATTENDING Daiva Eves, Remi Haggard N SONOGRAPHER Delcie Roch, RDCS, CCT ORDERING Tobias Alexander, M.D. PERFORMING Tobias Alexander, M.D. ADMITTING Tyson Alias  cc:  ------------------------------------------------------------------- LV EF: 40% - 45%  ------------------------------------------------------------------- Indications: Atrial fibrillation -  427.31.  ------------------------------------------------------------------- Study Conclusions  - Left ventricle: Systolic function was mildly to moderately  reduced. The estimated ejection fraction was in the range of 40%  to 45%. Diffuse hypokinesis. - Aortic valve: There is thickening and calcification of the left  coronary cusp. There is also a calcified subaortic membrane seen  about 0.5 cm bellow the oartic valve in the LVOT. The transaortic  gradeints were not calculated on the current study.  The leaflets don&'t coapt well and there is severe aortic  regurgitation. - Left atrium: The atrium was severely dilated. No evidence of  thrombus in the atrial cavity or appendage. No evidence of  thrombus in the atrial cavity or appendage. No evidence of  thrombus in the appendage. - Right atrium: No evidence of thrombus in the atrial cavity or  appendage. - Tricuspid valve: There was mild-moderate regurgitation. - Pulmonic valve: No evidence of vegetation. There was moderate  regurgitation.  Diagnostic transesophageal echocardiography. 2D and color Doppler. Birthdate: Patient birthdate: Jul 30, 1971. Age: Patient is 45 yr old. Sex: Gender: female. BMI: 17.4 kg/m^2. Blood pressure:  113/74 Patient status: Inpatient. Study date: Study date: 11/09/2015. Study time: 09:12 AM. Location: Endoscopy.  -------------------------------------------------------------------  ------------------------------------------------------------------- Left ventricle: Systolic function was mildly to moderately reduced. The estimated ejection fraction was in the range of 40% to 45%. Diffuse hypokinesis.  ------------------------------------------------------------------- Aortic valve: There is thickening and calcification of the left coronary cusp. There is also a calcified subaortic membrane seen about 0.5 cm bellow the oartic valve in the LVOT. The  transaortic gradeints were not calculated on the current study. The leaflets don&'t coapt well and there is severe aortic regurgitation. Structurally normal valve. Trileaflet; normal thickness leaflets. Cusp separation was normal. Doppler: There was no significant regurgitation.  ------------------------------------------------------------------- Aorta: The aorta was normal, not dilated, and non-diseased. There was no atheroma. There was no evidence for dissection. Aortic root: The aortic  root was not dilated. Ascending aorta: The ascending aorta was normal in size. Aortic arch: The aortic arch was normal in size. Descending aorta: The descending aorta was normal in size.  ------------------------------------------------------------------- Mitral valve: Structurally normal valve. Leaflet separation was normal. Doppler: There was no significant regurgitation.  ------------------------------------------------------------------- Left atrium: The atrium was severely dilated. No evidence of thrombus in the atrial cavity or appendage. No evidence of thrombus in the atrial cavity or appendage. No evidence of thrombus in the appendage. The appendage was morphologically a left appendage, multilobulated, and of normal size. Emptying velocity was decreased.  ------------------------------------------------------------------- Right ventricle: The cavity size was normal. Wall thickness was normal. Systolic function was normal.  ------------------------------------------------------------------- Pulmonic valve: Structurally normal valve. Cusp separation was normal. No evidence of vegetation. Doppler: There was moderate regurgitation.  ------------------------------------------------------------------- Tricuspid valve: Structurally normal valve. Leaflet separation was normal. Doppler: There was mild-moderate  regurgitation.  ------------------------------------------------------------------- Pulmonary artery: The main pulmonary artery was normal-sized.  ------------------------------------------------------------------- Right atrium: The atrium was normal in size. No evidence of thrombus in the atrial cavity or appendage. The appendage was morphologically a right appendage.  ------------------------------------------------------------------- Pericardium: There was no pericardial effusion.  ------------------------------------------------------------------- Post procedure conclusions Ascending Aorta:  - The aorta was normal, not dilated, and non-diseased.  ------------------------------------------------------------------- Prepared and Electronically Authenticated by  Tobias Alexander, M.D. 2017-03-01T11:25:06    CARDIAC CATHETERIZATION Procedures    Right/Left Heart Cath and Coronary Angiography    Conclusion    1. No angiographic evidence of CAD 2. Elevated right sided pressures.   Recommendations: Further workup in regards to her aortic valve disease per cardiology consult team.     Indications    Aortic valve disease [I35.9 (ICD-10-CM)]    Technique and Indications    Estimated blood loss <50 mL. Indication: Acute CHF, severe aortic valve insufficiency, moderate AS  Procedure: The risks, benefits, complications, treatment options, and expected outcomes were discussed with the patient. The patient and/or family concurred with the proposed plan, giving informed consent. The patient was brought to the cath lab after IV hydration was begun and oral premedication was given. The patient was further sedated with Versed and Fentanyl. There was an IV present in the right antecubital vein. This area was prepped and draped. I then changed this out for a 5 [place] sheath. Right heart catheterization performed with a balloon tipped catheter. The right  wrist was assessed with a modified Allens test which was positive. The right wrist was prepped and draped in a sterile fashion. 1% lidocaine was used for local anesthesia. Using the modified Seldinger access technique, a 5 French sheath was placed in the right radial artery. 3 mg Verapamil was given through the sheath. 3000 units IV heparin was given. Standard diagnostic catheters were used to perform selective coronary angiography. I engaged the RCA with a JR4 catheter and the left main with an EBU 3.0 guiding catheter. I crossed the aortic valve with an AL-1 catheter and straight wire. No LV gram performed. The sheath was removed from the right radial artery and a Terumo hemostasis band was applied at the arteriotomy site on the right wrist.   Sedation: During this procedure the patient is administered a total of Versed 3 mg and Fentanyl 25 mcg to achieve and maintain moderate conscious sedation. The patient's heart rate, blood pressure, and oxygen saturation are monitored continuously during the procedure. The period of conscious sedation is 49 minutes, of which I was present face-to-face 100% of this time.    Coronary Findings  Dominance: Left   Left Anterior Descending  . Vessel is large. Vessel is angiographically normal.   . First Diagonal Branch   The vessel is large in size.   Marland Kitchen Second Diagonal Branch   The vessel is moderate in size.     Left Circumflex  . Vessel is large. Vessel is angiographically normal.   . First Obtuse Marginal Branch   The vessel is small in size.   . Third Obtuse Marginal Branch   The vessel is large in size.   Marland Kitchen Fourth Obtuse Marginal Branch   The vessel is small in size.     Right Coronary Artery  . Vessel is moderate in size. Vessel is angiographically normal.       Right Heart Pressures Hemodynamic findings consistent with severe pulmonary hypertension. Elevated LV EDP consistent with volume  overload.    Coronary Diagrams    Diagnostic Diagram            Implants    No implant documentation for this case.    PACS Images    Show images for Cardiac catheterization     Link to Procedure Log    Procedure Log      Hemo Data       Most Recent Value   Fick Cardiac Output  7.02 L/min   Fick Cardiac Output Index  4.68 (L/min)/BSA   RA A Wave  -99 mmHg   RA V Wave  30 mmHg   RA Mean  25 mmHg   RV Systolic Pressure  94 mmHg   RV Diastolic Pressure  5 mmHg   RV EDP  23 mmHg   PA Systolic Pressure  90 mmHg   PA Diastolic Pressure  44 mmHg   PA Mean  60 mmHg   PW A Wave  -99 mmHg   PW V Wave  20 mmHg   PW Mean  17 mmHg   AO Systolic Pressure  127 mmHg   AO Diastolic Pressure  55 mmHg   AO Mean  80 mmHg   LV Systolic Pressure  142 mmHg   LV Diastolic Pressure  11 mmHg   LV EDP  21 mmHg   Arterial Occlusion Pressure Extended Systolic Pressure  124 mmHg   Arterial Occlusion Pressure Extended Diastolic Pressure  53 mmHg   Arterial Occlusion Pressure Extended Mean Pressure  78 mmHg   Left Ventricular Apex Extended Systolic Pressure  140 mmHg   Left Ventricular Apex Extended Diastolic Pressure  12 mmHg   Left Ventricular Apex Extended EDP Pressure  22 mmHg   QP/QS  1   TPVR Index  12.82 HRUI   TSVR Index  17.1 HRUI   PVR SVR Ratio  0.78   TPVR/TSVR Ratio  0.75     Cardiac/Coronary CT TECHNIQUE: The patient was scanned on a Philips 256 scanner. FINDINGS: A 120 kV prospective scan was triggered in the descending thoracic aorta at 111 HU's. Axial non-contrast 3 mm slices were carried out through the heart. The data set was analyzed on a dedicated work station and scored using the Agatson method. Gantry rotation speed was 270 msecs and collimation was .9 mm. No beta  blockade or NTG were given. The 3D data set was reconstructed in 5% intervals of the 67-82 % of the R-R cycle. Diastolic phases were analyzed on a dedicated work station using MPR, MIP and VRT modes. The patient received 80 cc of contrast. Aorta: Normal size of the aortic root (32 x 31 x  29 mm) and visualized portion of the ascending aorta. Aortic Valve: Trileaflet, mildly calcified, focal calcification of the left coronary cusp. There is a subaortic membrane present in the LVOT located 6 mm bellow the non-coronary cusp. Coronary Arteries: Normal origin. Left dominance. No evidence of CAD. Dilated pulmonary artery measuring 35 mm consistent with pulmonary hypertension. There are 4 pulmonary veins that are draining normally into the left atrium. There is no anomalous pulmonary vein drainage. No ASD or VSD is seen. IMPRESSION: 1. Normal size of the aortic root and visualized portion of the ascending aorta. 2. Mildly thickened and calcified trileaflet aortic valve with leaflet non-coaptation and subaortic membrane located 6 mm bellow the non-coronary cusp. 3. No ASD or VSD seen. Normal pulmonary vein drainage in the LVOT. 4. Dilated pulmonary artery consistent with pulmonary hypertension. 5. Normal coronary origin with left dominance. No CAD. Tobias Alexander Electronically Signed  By: Tobias Alexander  On: 01/12/2016 16:54     Study Result     EXAM: OVER-READ INTERPRETATION CT CHEST  The following report is an over-read performed by radiologist Dr. Irma Newness [facility] on 01/12/2016. This over-read does not include interpretation of cardiac or coronary anatomy or pathology. The coronary CTA interpretation by the cardiologist is attached.  COMPARISON: Chest radiographs today and 01/08/2016.  FINDINGS: Mediastinum/Nodes: No enlarged mediastinal or hilar lymph nodes are identified. Cardiovascular findings are dictated  separately.  Lungs/Pleura: There are moderate-sized pleural effusions, larger on the right. There is associated dependent atelectasis at both lung bases. In addition, there are patchy ground-glass opacities in both lungs, probably a combination of edema and atelectasis.  Upper abdomen: There is prominent reflux of contrast into the IVC and hepatic veins. The hepatic veins appear mildly dilated.  Musculoskeletal: Generalized soft tissue edema suspicious for anasarca. No focal osseous abnormalities are seen.  IMPRESSION: 1. Cardiomegaly, pulmonary edema and bilateral pleural effusions consistent with congestive heart failure. 2. Associated atelectasis at both lung bases. 3. Cardiovascular findings dictated separately  Electronically Signed: By: Carey Bullocks M.D. 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 She describes a long progression of symptoms of exertional shortness of breath, orthopnea, PND and lower extremity edema consistent with chronic combined systolic and diastolic congestive heart failure. She has had 2 recent hospitalizations for acute exacerbation of class IV symptoms, both of which included recurrent atrial fibrillation and chest pain. I have personally reviewed the patient's recent echocardiograms and diagnostic catheterization. Her aortic valve is tricuspid but severely insufficient. The aortic annulus is not dilated and in fact her aortic root may be relatively small. She has a clear subvalvular membrane causing significant stenosis. LV function is at least mildly reduced. There were no other significant abnormalities noted on either ECHO study, and RV function appeared normal. However, right heart cath was notable for severe pulmonary hypertension. O2 saturation data was not obtained at the time of cath to r/o shunt  physiology.  Cardiac-gated CTA of heart revealed no other anomalous conditions to explain the severity of her pulmonary hypertension, although PFT's revealed severe COPD with FEV1 only 0.92 liters (33% predicted).  She has been doing well since her most recent hospital discharge, she has not been smoking any cigarettes, and she hopes to proceed with definitive surgical intervention in the near future.    Plan:  The patient was again counseled at length  regarding treatment alternatives for management of severe aortic insufficiency with subvalvular aortic stenosis. The indications, risks, potential benefits of aortic valve replacement with resection of subvalvar aortic stenosis had been discussed. Surgical options were discussed at length including conventional surgical aortic valve replacement through either a full median sternotomy or using minimally invasive techniques. Other alternatives including rapid-deployment bioprosthetic tissue valve replacement, transcatheter aortic valve replacement, patch enlargement of the aortic root, stentless porcine aortic root replacement, valve repair, the Ross autograft procedure, and homograft aortic root replacement were discussed. Discussion was held comparing the relative risks of mechanical valve replacement with need for lifelong anticoagulation versus use of a bioprosthetic tissue valve and the associated potential for late structural valve deterioration and failure. This discussion was placed in the context of the patient's particular circumstances, and as a result the patient specifically requests that their valve be replaced using a mechanical prosthetic valve. The relative risks and benefits of performing a maze procedure at the time of their surgery was discussed at length, including the expected likelihood of long term freedom from recurrent symptomatic atrial fibrillation and/or atrial flutter. The patient understands and accepts all potential  associated risks of surgery including but not limited to risk of death, stroke, myocardial infarction, congestive heart failure, respiratory failure, renal failure, pneumonia, bleeding requiring blood transfusion and or reexploration, arrhythmia, heart block or bradycardia requiring permanent pacemaker, aortic dissection or other major vascular complication, pleural effusions or other delayed complications related to continued congestive heart failure, and other late complications related to valve replacement including structural valve deterioration and failure, thrombosis, endocarditis, or paravalvular leak. We plan to proceed with surgery on Thursday, 02/09/2016. The patient has been instructed to remain off of Xarelto between now and the time of surgery.      Salvatore Decent. Cornelius Moras, MD 01/30/2016 11:33 AM

## 2016-02-01 ENCOUNTER — Telehealth: Payer: Self-pay

## 2016-02-01 NOTE — Telephone Encounter (Signed)
This Case Manager placed call to patient to remind her of her Transitional Care follow-up appointment on 02/02/16 at 0930 with Dr. Venetia Night. Patient aware of appointment and indicated she has transportation to her appointment. Patient indicated she has been doing daily weight checks and trying to adhere to a low sodium diet. Instructed patient to bring weight log as well as daily medications to appointment for Dr. Venetia Night to review. Patient indicated her Xarelto is on hold. Patient scheduled for aortic valve replacement surgery on 02/09/16 with Dr. Cornelius Moras. No additional needs/concerns identified at this time.

## 2016-02-02 ENCOUNTER — Encounter: Payer: Self-pay | Admitting: Family Medicine

## 2016-02-02 ENCOUNTER — Ambulatory Visit: Payer: Self-pay | Attending: Family Medicine | Admitting: Family Medicine

## 2016-02-02 VITALS — BP 110/43 | HR 69 | Temp 97.6°F | Resp 14 | Ht 63.5 in | Wt 93.0 lb

## 2016-02-02 DIAGNOSIS — Q244 Congenital subaortic stenosis: Secondary | ICD-10-CM

## 2016-02-02 DIAGNOSIS — I351 Nonrheumatic aortic (valve) insufficiency: Secondary | ICD-10-CM | POA: Insufficient documentation

## 2016-02-02 DIAGNOSIS — Z9889 Other specified postprocedural states: Secondary | ICD-10-CM | POA: Insufficient documentation

## 2016-02-02 DIAGNOSIS — Z79899 Other long term (current) drug therapy: Secondary | ICD-10-CM | POA: Insufficient documentation

## 2016-02-02 DIAGNOSIS — I5042 Chronic combined systolic (congestive) and diastolic (congestive) heart failure: Secondary | ICD-10-CM | POA: Insufficient documentation

## 2016-02-02 DIAGNOSIS — Z7901 Long term (current) use of anticoagulants: Secondary | ICD-10-CM | POA: Insufficient documentation

## 2016-02-02 DIAGNOSIS — J449 Chronic obstructive pulmonary disease, unspecified: Secondary | ICD-10-CM | POA: Insufficient documentation

## 2016-02-02 DIAGNOSIS — I4892 Unspecified atrial flutter: Secondary | ICD-10-CM | POA: Insufficient documentation

## 2016-02-02 DIAGNOSIS — F419 Anxiety disorder, unspecified: Secondary | ICD-10-CM | POA: Insufficient documentation

## 2016-02-02 DIAGNOSIS — I350 Nonrheumatic aortic (valve) stenosis: Secondary | ICD-10-CM | POA: Insufficient documentation

## 2016-02-02 NOTE — Progress Notes (Signed)
Pt here for F/U for CHF. Pt has heart surgery in a week, June 1. Pt has taken meds today and does not need refills.

## 2016-02-02 NOTE — Patient Instructions (Signed)

## 2016-02-02 NOTE — Progress Notes (Signed)
Transitional care clinic  Date of telephone encounter: 01/16/16  PCP: Dr  Julien Nordmann  Subjective:    Patient ID: Sheila Branch, female    DOB: [birth year], 45 y.o.   MRN: [id number]  HPI  She is a 45 year old female with a history of congenital heart disease, aortic insufficiency, aortic stenosis, chronic combined systolic and diastolic heart failure (EF 40-45%), A. fib, COPD recently hospitalized for acute on chronic systolic and diastolic CHF.  She recently had some dental extractions in anticipation for aortic valve replacement which is scheduled for 02/09/16 by cardiothoracic surgery-Dr. Barry Dienes. She has opted for a mechanical prosthetic valve and is currently off Xarelto until her surgery.  She has a supportive family and states she is in a good state of mind and is not anxious. No additional concerns today. Denies shortness of breath, pedal edema, orthopnea or weight gain.  Past Medical History  Diagnosis Date  . Aortic insufficiency     severe  . Subvalvar aortic stenosis   . Chronic combined systolic and diastolic congestive heart failure (HCC)   . Atrial fibrillation with rapid ventricular response (HCC) 01/08/2016  . Persistent atrial fibrillation (HCC) 11/03/2015  . CHD (congenital heart disease)   . COPD (chronic obstructive pulmonary disease) (HCC)   . History of pneumonia   . Anxiety     Past Surgical History  Procedure Laterality Date  . Cardiac catheterization  childhood  . Cesarean section      pt. denies  . Rectal surgery    . Tubal ligation    . Cardioversion N/A 11/09/2015    Procedure: CARDIOVERSION;  Surgeon: Lars Masson, MD;  Location: [facility] ENDOSCOPY;  Service: Cardiovascular;  Laterality: N/A;  . Tee without cardioversion N/A 11/09/2015    Procedure: TRANSESOPHAGEAL ECHOCARDIOGRAM (TEE);  Surgeon: Lars Masson, MD;  Location: [facility] ENDOSCOPY;  Service: Cardiovascular;  Laterality: N/A;  . Cardiac catheterization N/A 01/11/2016    Procedure: Right/Left  Heart Cath and Coronary Angiography;  Surgeon: Kathleene Hazel, MD;  Location: [facility] INVASIVE CV LAB;  Service: Cardiovascular;  Laterality: N/A;  . Multiple extractions with alveoloplasty N/A 01/25/2016    Procedure: Extraction of tooth #'s 4,5,16-18,30,31 with alveoloplasty and gross debridement of remaining teeth.;  Surgeon: Charlynne Pander, DDS;  Location: [facility] OR;  Service: Oral Surgery;  Laterality: N/A;  . Mouth surgery Bilateral 01-25-16    Allergies  Allergen Reactions  . Penicillins Cross Reactors Swelling and Other (See Comments)    Mouth Ulcers Has patient had a PCN reaction causing immediate rash, facial/tongue/throat swelling, SOB or lightheadedness with hypotension: Yes Has patient had a PCN reaction causing severe rash involving mucus membranes or skin necrosis: Yes Has patient had a PCN reaction that required hospitalization No Has patient had a PCN reaction occurring within the last 10 years: No If all of the above answers are "NO", then may proceed with Cephalosporin use.     Current Outpatient Prescriptions on File Prior to Visit  Medication Sig Dispense Refill  . amiodarone (PACERONE) 200 MG tablet Take 1 tablet (200 mg total) by mouth 2 (two) times daily. 60 tablet 0  . furosemide (LASIX) 40 MG tablet Take 1 tablet (40 mg total) by mouth daily. 30 tablet 0  . oxyCODONE-acetaminophen (PERCOCET) 5-325 MG tablet Take one or two tablets by mouth every 6 hours as needed for moderate to severe  pain. 40 tablet 0  . XARELTO 20 MG TABS tablet Take 20 mg by mouth.   3   No  current facility-administered medications on file prior to visit.    Review of Systems Review of Systems  Constitutional: Negative for activity change, appetite change and fatigue.  HENT: Negative for congestion, sinus pressure and sore throat.   Eyes: Negative for visual disturbance.  Respiratory: Negative for cough, chest tightness, shortness of breath and wheezing.   Cardiovascular: Negative  for chest pain and palpitations.  Gastrointestinal: Negative for abdominal pain, constipation and abdominal distention.  Endocrine: Negative for polydipsia.  Genitourinary: Negative for dysuria and frequency.  Musculoskeletal: Negative for back pain and arthralgias.  Skin: Negative for rash.  Neurological: Negative for tremors, light-headedness and numbness.  Hematological: Had an episode of bleeding after dental extraction. Psychiatric/Behavioral: Negative for behavioral problems and agitation.      Objective: Filed Vitals:   02/02/16 0951 02/02/16 1023  BP: 159/62 110/43  Pulse: 69   Temp: 97.6 F (36.4 C)   TempSrc: Oral   Resp: 14   Height: 5' 3.5" (1.613 m)   Weight: 93 lb (42.185 kg)   SpO2: 97%       Physical Exam Constitutional: She is oriented to person, place, and time. She appears well-developed and well-nourished.  Cardiovascular: Normal rate and intact distal pulses.   Murmur (3/6 systolic murmur, 2/6 diastolic murmur) heard. Pulmonary/Chest: Effort normal and breath sounds normal. She has no wheezes. She has no rales. She exhibits no tenderness.  Abdominal: Soft. Bowel sounds are normal. She exhibits no distension and no mass. There is no tenderness.  Musculoskeletal: Normal range of motion.  Neurological: She is alert and oriented to person, place, and time.  Skin: Skin is warm and dry. Psych: normal       Assessment & Plan:  1. Chronic combined systolic and diastolic heart failure (HCC) Ejection fraction of 40-45% Euvolemic at this time Weight has remained stable; continue low sodium, cardiac diet, daily weight checks, limit fluids to less than 2 L per day. Continue Lasix - monitor creatinine Cardiology appointment the end of month.  2. Atrial flutter with rapid ventricular response (HCC) Currently off Xarelto in anticipation of aortic valve replacement Rate control with amiodarone (TSH was normal, PFT revealed obstructive airway disease)  3. Aortic  insufficiency Scheduled for aortic valve replacement with a mechanical prosthetic valve by Dr. Barry Dienes  4. Subvalvar aortic stenosis Scheduled for surgery on 6//2017  5. COPD Asymptomatic Not on any medications at this time.  This note has been created with Education officer, environmental. Any transcriptional errors are unintentional.

## 2016-02-03 ENCOUNTER — Encounter: Payer: Self-pay | Admitting: Cardiology

## 2016-02-07 ENCOUNTER — Encounter (HOSPITAL_COMMUNITY): Payer: Self-pay | Admitting: Dentistry

## 2016-02-07 ENCOUNTER — Ambulatory Visit (HOSPITAL_COMMUNITY): Payer: Self-pay | Admitting: Dentistry

## 2016-02-07 VITALS — BP 151/34 | HR 62 | Temp 97.5°F

## 2016-02-07 DIAGNOSIS — I351 Nonrheumatic aortic (valve) insufficiency: Secondary | ICD-10-CM

## 2016-02-07 DIAGNOSIS — K08409 Partial loss of teeth, unspecified cause, unspecified class: Secondary | ICD-10-CM

## 2016-02-07 DIAGNOSIS — Z01818 Encounter for other preprocedural examination: Secondary | ICD-10-CM

## 2016-02-07 NOTE — Pre-Procedure Instructions (Signed)
TIMOLIN LININGER  02/07/2016      [facility] - [place], [place] - [address] [place] [address] Phone: [phone] Fax: [phone]  [facility] [address] [place], [place] - [address] [place] [address] Phone: [phone] Fax: [phone]    Your procedure is scheduled on Thurs, June 1 @ 7:30 AM  Report to [facility] Admitting at 5:30 AM  Call this number if you have problems the morning of surgery:  [phone]   Remember:  Do not eat food or drink liquids after midnight.  Take these medicines the morning of surgery with A SIP OF WATER Amiodarone(Pacerone) and Pain Pill(if needed)             Remain off your Xarelto as indicated by Dr.Owen. No Goody's,BC's,Aleve,Advil,Motrin,Fish Oil,Ibuprofen,or any Herbal Medications.     Do not wear jewelry, make-up or nail polish.  Do not wear lotions, powders, or perfumes.    Do not shave 48 hours prior to surgery.    Do not bring valuables to the hospital.  [facility] is not responsible for any belongings or valuables.  Contacts, dentures or bridgework may not be worn into surgery.  Leave your suitcase in the car.  After surgery it may be brought to your room.  For patients admitted to the hospital, discharge time will be determined by your treatment team.  Patients discharged the day of surgery will not be allowed to drive home.    Special instructions: [place] - Preparing for Surgery  Before surgery, you can play an important role.  Because skin is not sterile, your skin needs to be as free of germs as possible.  You can reduce the number of germs on you skin by washing with CHG (chlorahexidine gluconate) soap before surgery.  CHG is an antiseptic cleaner which kills germs and bonds with the skin to continue killing germs even after washing.  Please DO NOT use if you have an allergy to CHG or antibacterial soaps.  If  your skin becomes reddened/irritated stop using the CHG and inform your nurse when you arrive at Short Stay.  Do not shave (including legs and underarms) for at least 48 hours prior to the first CHG shower.  You may shave your face.  Please follow these instructions carefully:   1.  Shower with CHG Soap the night before surgery and the                                morning of Surgery.  2.  If you choose to wash your hair, wash your hair first as usual with your       normal shampoo.  3.  After you shampoo, rinse your hair and body thoroughly to remove the                      Shampoo.  4.  Use CHG as you would any other liquid soap.  You can apply chg directly       to the skin and wash gently with scrungie or a clean washcloth.  5.  Apply the CHG Soap to your body ONLY FROM THE NECK DOWN.        Do not use on open wounds or open sores.  Avoid contact with your eyes,  ears, mouth and genitals (private parts).  Wash genitals (private parts)       with your normal soap.  6.  Wash thoroughly, paying special attention to the area where your surgery        will be performed.  7.  Thoroughly rinse your body with warm water from the neck down.  8.  DO NOT shower/wash with your normal soap after using and rinsing off       the CHG Soap.  9.  Pat yourself dry with a clean towel.            10.  Wear clean pajamas.            11.  Place clean sheets on your bed the night of your first shower and do not        sleep with pets.  Day of Surgery  Do not apply any lotions/deoderants the morning of surgery.  Please wear clean clothes to the hospital/surgery center.    Please read over the following fact sheets that you were given. Pain Booklet, Coughing and Deep Breathing, Blood Transfusion Information and Surgical Site Infection Prevention

## 2016-02-07 NOTE — H&P (Signed)
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 The patient states that she has known that she has had a heart murmur all of her life and has been told that she had a "hole in her heart" She apparently underwent cardiac catheterization during her childhood and her mother was told that she needed cardiac surgery. According to the patient her mother insisted on postponing surgical intervention and treating her medically as long as possible. The patient moved to [place] within 20 years ago. She apparently underwent echocardiography and was evaluated by cardiologist when she was pregnant with her second child approximately 18 years ago. She carried her child to term pregnancy and had an uncomplicated delivery. She never returned for medical follow-up.  The patient states that approximately 9 months ago she began to experience progressive symptoms of exertional shortness of breath Symptoms gradually got worse and became problematic with relatively mild physical activity and occasionally at rest. She developed symptoms of orthopnea, PND and lower extremity edema. He quit smoking approximately 4 months ago but shortness of breath continued to get worse. She was hospitalized acutely from 11/03/2015 through 11/08/2015 for community acquired pneumonia and rapid atrial fibrillation and atrial flutter.  Transthoracic echocardiogram performed at that time revealed severe aortic insufficiency with subvalvar aortic stenosis. There was mild left ventricular systolic dysfunction with ejection fraction estimated 50-55%. There was trivial mitral regurgitation but severe left atrial enlargement. There was moderate right atrial enlargement and findings consistent with moderate pulmonary hypertension. The patient underwent TEE and was successfully cardioverted back to sinus rhythm. Transesophageal echocardiogram revealed diffuse "left ventricular systolic dysfunction with ejection fraction estimated 40% There was severe aortic insufficiency and subvalvar aortic membrane. She was discharged from the hospital and seen in follow-up by Nada Boozer on 12/22/2015.   The patient continued to experience symptoms of exertional shortness of breath, chest pain and a productive cough. She subsequently developed GI illness with diarrhea. She was readmitted to the hospital 01/06/2016 with recurrent persistent atrial fibrillation associated with rapid ventricular response. She spontaneously converted back to sinus rhythm on intravenous amiodarone. Diagnostic cardiac catheterization revealed normal coronary artery anatomy with no significant coronary artery disease. She had severe pulmonary hypertension with PA pressures measured 90/44 andpulmonary capillary wedge pressure reported 17 mmHg. Fick cardiac output was measured 7.02 L/m corresponding to cardiac index of 4.68. Formal oxygen saturation run was not performed to assess for possible shunt physiology. Cardiothoracic surgical consultation was requested.  The patient is single and lives locally in [place] with her brother. 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 Her mother passed away several years ago. She has to a adult children, the youngest of which is in college locally in [place]. He describes a long-standing history of exertional shortness of  breath has progressed over the past year. In recent months she gets short of breath with  low level physical activity and occasionally at rest She has occasional episodes of PND and orthopnea. She has had episodes of chest pain or chest tightness that have been exacerbated by tachycardia. She has not had dizzy spells or syncope. She has mild lower extremity edema. Since hospital admission her breathing has improved. She states that her diarrhea has improved and her appetite is slowly returning to normal.   Patient returns to the office today for follow-up of congenital heart disease with severe subvalvar aortic stenosis, severe aortic insufficiency, and recurrent persistent atrial fibrillation. She was originally seen in consultation on 01/11/2016 during her recent hospitalization for an acute exacerbation of chronic combined systolic and diastolic congestive heart failure that was further complicated by an episode of colitis. Since hospital discharge the patient has done well. She underwent dental extraction last week by Dr. Robin Searing. Over the weekend she developed bleeding from her mouth related to her dental extraction. She was seen in the emergency department where her mouth was packed with Surgicel. The patient was advised to stop taking Xarelto at that time. She returns to our office for follow-up and states that she has not had any further bleeding. She otherwise has been doing well. Her breathing has been quite stable and noticeably much better than it was at the time of her recent hospital admission. She has not had fevers, chills, or productive cough. Her appetite is normalized and her bowel function is back to normal. She has not been smoking cigarettes.   Past Medical History  Diagnosis Date  . Aortic insufficiency     severe  . Subvalvar aortic stenosis   . Chronic combined systolic and diastolic congestive heart failure (HCC)   . Atrial fibrillation with rapid ventricular response  (HCC) 01/08/2016  . Persistent atrial fibrillation (HCC) 11/03/2015  . CHD (congenital heart disease)   . COPD (chronic obstructive pulmonary disease) (HCC)   . History of pneumonia   . Anxiety     Past Surgical History  Procedure Laterality Date  . Cardiac catheterization  childhood  . Cesarean section      pt. denies  . Rectal surgery    . Tubal ligation    . Cardioversion N/A 11/09/2015    Procedure: CARDIOVERSION;  Surgeon: Lars Masson, MD;  Location: [facility] ENDOSCOPY;  Service: Cardiovascular;  Laterality: N/A;  . Tee without cardioversion N/A 11/09/2015    Procedure: TRANSESOPHAGEAL ECHOCARDIOGRAM (TEE);  Surgeon: Lars Masson, MD;  Location[facility] ENDOSCOPY;  Service: Cardiovascular;  Laterality: N/A;  . Cardiac catheterization N/A 01/11/2016    Procedure: Right/Left Heart Cath and Coronary Angiography;  Surgeon: Kathleene Hazel, MD;  Location: [facility] INVASIVE CV LAB;  Service: Cardiovascular;  Laterality: N/A;  . Multiple extractions with alveoloplasty N/A 01/25/2016    Procedure: Extraction of tooth #'s 4,5,16-18,30,31 with alveoloplasty and gross debridement of remaining teeth.;  Surgeon: Charlynne Pander, DDS;  Location: [facility] OR;  Service: Oral Surgery;  Laterality: N/A;  . Mouth surgery Bilateral 01-25-16    Family History  Problem Relation Age of Onset  . Heart disease Mother     "i think she died of heart issue"    Social History Social History  Substance Use Topics  . Smoking status: Former Smoker -- 1.00 packs/day for 23 years    Types: Cigarettes    Quit date: 09/12/2015  . Smokeless tobacco: Never Used  . Alcohol Use: 0.0 oz/week    0 Standard drinks or equivalent per week     Comment:  occasionally    Prior to Admission medications   Medication Sig Start Date End Date Taking? Authorizing Provider  amiodarone (PACERONE) 200 MG tablet Take 1 tablet (200 mg total) by mouth 2 (two) times daily. 01/15/16  Yes Lora Paula, MD  furosemide (LASIX) 40 MG tablet  Take 1 tablet (40 mg total) by mouth daily. 01/15/16  Yes Lora Paula, MD  oxyCODONE-acetaminophen (PERCOCET) 5-325 MG tablet Take one or two tablets by mouth every 6 hours as needed for moderate to severe  pain. 01/25/16  Yes Derrell Lolling Kulinski, DDS  XARELTO 20 MG TABS tablet Take 20 mg by mouth every evening.  01/20/16  Yes Historical Provider, MD    Allergies  Allergen Reactions  . Penicillins Cross Reactors Swelling and Other (See Comments)    Mouth Ulcers Has patient had a PCN reaction causing immediate rash, facial/tongue/throat swelling, SOB or lightheadedness with hypotension: Yes Has patient had a PCN reaction causing severe rash involving mucus membranes or skin necrosis: Yes Has patient had a PCN reaction that required hospitalization No Has patient had a PCN reaction occurring within the last 10 years: No If all of the above answers are "NO", then may proceed with Cephalosporin use.       Review of Systems:  General:decreased appetite, decreased energy, no weight gain, no weight loss, no fever Cardiac:+ chest pain with exertion, + chest pain at rest, +SOB with exertion, + resting SOB, + PND, + orthopnea, + palpitations, + arrhythmia, + atrial fibrillation, + LE edema, no dizzy spells, no syncope Respiratory:+ shortness of breath, no home oxygen, + productive cough, no dry cough, + bronchitis, no wheezing, no hemoptysis, no asthma, no pain with inspiration or cough, no sleep apnea, no CPAP at night GI:no difficulty swallowing, no reflux, no frequent heartburn, no hiatal hernia, no abdominal pain, no constipation, no diarrhea, no hematochezia, no hematemesis, no melena GU:no dysuria, no frequency, no urinary tract infection, no hematuria, no kidney stones, no kidney  disease Vascular:no pain suggestive of claudication, no pain in feet, no leg cramps, no varicose veins, no DVT, no non-healing foot ulcer Neuro:no stroke, no TIA's, no seizures, no headaches, no temporary blindness one eye, no slurred speech, no peripheral neuropathy, no chronic pain, no instability of gait, no memory/cognitive dysfunction Musculoskeletal:no arthritis, no joint swelling, no myalgias, no difficulty walking, normal mobility  Skin:no rash, no itching, no skin infections, no pressure sores or ulcerations Psych:no anxiety, no depression, + nervousness, no unusual recent stress Eyes:+ blurry vision, no floaters, no recent vision changes, does not wear glasses or contacts ENT:no hearing loss, no loose or painful teeth, no dentures, last saw dentist many years ago Hematologic:no easy bruising, no abnormal bleeding, no clotting disorder, no frequent epistaxis Endocrine:no diabetes, does not check CBG's at home   Physical Exam:  BP 135/62 mmHg  Pulse 64  Temp(Src) 97.5 F (36.4 C) (Oral)  Resp 20  Ht 5\' 3"  (1.6 m)  Wt 112 lb 11.2 oz (51.12 kg)  BMI 19.97 kg/m2  SpO2 94%  LMP 12/12/2015 General:Thin, well-appearing HEENT:Unremarkable  Neck:no JVD, no bruits, no adenopathy  Chest:clear to auscultation, symmetrical breath sounds, no wheezes, no rhonchi  CV:RRR, grade IV/VI systolic and diastolic murmurs  Abdomen:soft,  non-tender, no masses  Extremities:warm, well-perfused, pulses palpable, trace lower extremity edema Rectal/GUDeferred Neuro:Grossly non-focal and symmetrical throughout Skin:Clean and dry, no rashes, no breakdown  Diagnostic Tests:  Transthoracic Echocardiography  (Report amended )  Patient: Kamauria, Weisinger MR #: [id number] Study Date:  11/05/2015 Gender: F Age: 89 Height: 160 cm Weight: 44.2 kg BSA: 1.39 m^2 Pt. Status: Room: 3W06C  SONOGRAPHER Nolon Rod, RDCS PERFORMING [facility], Inpatient ATTENDING Coram, Courteney Lyn ADMITTING Erlinda Hong Irven Coe, Marquita Palms  cc:  ------------------------------------------------------------------- LV EF: 50% - 55%  ------------------------------------------------------------------- Indications: Dyspnea 786.09.  ------------------------------------------------------------------- History: PMH: Aortic valve disease. Mitral valve disease. Risk factors: Former tobacco use.  ------------------------------------------------------------------- Study Conclusions  - Left ventricle: The cavity size was mildly dilated. There was  mild concentric hypertrophy. Systolic function was mildly  reduced. The estimated ejection fraction was in the range of 50%  to 55%. Diffuse hypokinesis. - Aortic valve: There appears to be sub-valvular calcification in  the LVOT that could be causing sub-valvular stenosis.  Trileaflet; mildly thickened, moderately calcified leaflets,  mostly the left coronary cusp. Transvalvular velocity was  increased. There was moderate stenosis. There was moderate to  severe regurgitation. Peak velocity (S): 350 cm/s. Mean gradient  (S): 26 mm Hg. Valve area (VTI): 1.14 cm^2.  Valve area (Vmax):  1.02 cm^2. Valve area (Vmean): 1.09 cm^2. Regurgitation pressure  half-time: 392 ms. - Mitral valve: Calcified annulus. Transvalvular velocity was  within the normal range. There was no evidence for stenosis.  There was trivial regurgitation. Valve area by continuity  equation (using LVOT flow): 2.24 cm^2. - Left atrium: The atrium was severely dilated. - Right ventricle: The cavity size was normal. Wall thickness was  normal. Systolic function was normal. - Right atrium: The atrium was moderately dilated. Central venous  pressure (est): 15 mm Hg. - Atrial septum: A patent foramen ovale cannot be excluded. - Tricuspid valve: There was mild-moderate regurgitation. - Pulmonic valve: There was mild regurgitation. - Pulmonary arteries: Systolic pressure was moderately increased.  PA peak pressure: 50 mm Hg (S). - Inferior vena cava: The vessel was dilated. The respirophasic  diameter changes were blunted (< 50%), consistent with elevated  central venous pressure.  Impressions:  - No ASD, PFO or VSD closure devide was noted. There is a  calcified, sub-aortic lesion in the LVOT that is likely causing  sub-valvular stenosis. There is clearly a gradient consistent  with moderate stenosis, though it cannot be determined from this  echo how much of the gradient is from the aortic valve vs. the  subvalvular calcification. There is an echodensity in the main  pulmonary artery that is likely acoustic shadow from the cacified  aortic valve and subvalvular apparatus. However, thrombus cannot  be excluded. If there is suspicion for pulmonary embolism,  consider further evaluation.  Transthoracic echocardiography. M-mode, complete 2D, spectral Doppler, and color Doppler. Birthdate: Patient birthdate: May 19, 1971. Age: Patient is 45 yr old. Sex: Gender: female. BMI: 17.3 kg/m^2. Blood pressure: 110/74 Patient status: Inpatient. Study date:  Study date: 11/05/2015. Study time: 10:26 AM. Location: Bedside.  -------------------------------------------------------------------  ------------------------------------------------------------------- Left ventricle: The cavity size was mildly dilated. There was mild concentric hypertrophy. Systolic function was mildly reduced. The estimated ejection fraction was in the range of 50% to 55%. Diffuse hypokinesis. The study was not technically sufficient to allow evaluation of LV diastolic dysfunction due to atrial fibrillation.  ------------------------------------------------------------------- Aortic valve: There appears to be sub-valvular calcification in the LVOT that could be causing sub-valvular stenosis. Trileaflet; mildly thickened, moderately calcified leaflets, mostly the left coronary cusp. Mobility was not restricted. Doppler: Transvalvular velocity was increased. There was moderate stenosis. There was moderate to severe regurgitation. VTI ratio of LVOT to aortic valve: 0.28. Valve area (VTI): 1.14 cm^2. Indexed valve area (VTI):  0.82 cm^2/m^2. Peak velocity ratio of LVOT to aortic valve: 0.25. Valve area (Vmax): 1.02 cm^2. Indexed valve area (Vmax): 0.73 cm^2/m^2. Mean velocity ratio of LVOT to aortic valve: 0.26. Valve area (Vmean): 1.09 cm^2. Indexed valve area (Vmean): 0.78 cm^2/m^2.  Mean gradient (S): 26 mm Hg. Peak gradient (S): 49 mm Hg.  ------------------------------------------------------------------- Aorta: Aortic root: The aortic root was normal in size.  ------------------------------------------------------------------- Mitral valve: Calcified annulus. Mobility was not restricted. Doppler: Transvalvular velocity was within the normal range. There was no evidence for stenosis. There was trivial regurgitation. Valve area by pressure half-time: 2.82 cm^2. Indexed valve area by pressure half-time: 2.03 cm^2/m^2. Valve area by  continuity equation (using LVOT flow): 2.24 cm^2. Indexed valve area by continuity equation (using LVOT flow): 1.61 cm^2/m^2. Mean gradient (D): 3 mm Hg. Peak gradient (D): 6 mm Hg.  ------------------------------------------------------------------- Left atrium: The atrium was severely dilated.  ------------------------------------------------------------------- Atrial septum: A patent foramen ovale cannot be excluded.  ------------------------------------------------------------------- Right ventricle: The cavity size was normal. Wall thickness was normal. Systolic function was normal.  ------------------------------------------------------------------- Pulmonic valve: Structurally normal valve. Cusp separation was normal. Doppler: Transvalvular velocity was within the normal range. There was no evidence for stenosis. There was mild regurgitation.  ------------------------------------------------------------------- Tricuspid valve: Structurally normal valve. Doppler: Transvalvular velocity was within the normal range. There was mild-moderate regurgitation.  ------------------------------------------------------------------- Pulmonary artery: The main pulmonary artery was normal-sized. Systolic pressure was moderately increased.  ------------------------------------------------------------------- Right atrium: The atrium was moderately dilated.  ------------------------------------------------------------------- Pericardium: There was no pericardial effusion.  ------------------------------------------------------------------- Systemic veins: Inferior vena cava: The vessel was dilated. The respirophasic diameter changes were blunted (< 50%), consistent with elevated central venous pressure.  ------------------------------------------------------------------- Measurements  Left ventricle Value  Reference LV ID, ED, PLAX chordal 43.8 mm 43 - 52 LV ID, ES, PLAX chordal 35.5 mm 23 - 38 LV fx shortening, PLAX chordal (L) 19 % >=29 LV PW thickness, ED 12.3 mm --------- IVS/LV PW ratio, ED 1 <=1.3 Stroke volume, 2D 82 ml --------- Stroke volume/bsa, 2D 59 ml/m^2 --------- LV ejection fraction, 1-p A4C 52 % --------- LV end-diastolic volume, 2-p 137 ml --------- LV end-systolic volume, 2-p 62 ml --------- LV ejection fraction, 2-p 55 % --------- Stroke volume, 2-p 76 ml --------- LV end-diastolic volume/bsa, 2-p 98 ml/m^2 --------- LV end-systolic volume/bsa, 2-p 44 ml/m^2 --------- Stroke volume/bsa, 2-p 54.2 ml/m^2 ---------  Ventricular septum Value Reference IVS thickness, ED 12.3 mm ---------  LVOT Value Reference LVOT ID, S 23 mm --------- LVOT area 4.15 cm^2 --------- LVOT peak velocity, S 86.3 cm/s --------- LVOT mean velocity, S 61.1 cm/s --------- LVOT VTI, S 19.8 cm ---------  Aortic valve Value Reference Aortic valve peak velocity, S 350 cm/s --------- Aortic valve mean velocity, S 232 cm/s --------- Aortic valve VTI, S 71.8 cm --------- Aortic mean gradient, S 26 mm Hg --------- Aortic  peak gradient, S 49 mm Hg --------- VTI ratio, LVOT/AV 0.28 --------- Aortic valve area, VTI 1.14 cm^2 --------- Aortic valve area/bsa, VTI 0.82 cm^2/m^2 --------- Velocity ratio, peak, LVOT/AV 0.25 --------- Aortic valve area, peak velocity 1.02 cm^2 --------- Aortic valve area/bsa, peak 0.73 cm^2/m^2 --------- velocity Velocity ratio, mean, LVOT/AV 0.26 --------- Aortic valve area, mean velocity 1.09 cm^2 --------- Aortic valve area/bsa, mean 0.78 cm^2/m^2 --------- velocity Aortic regurg pressure half-time 392 ms ---------  Aorta Value Reference Aortic root ID, ED 30 mm ---------  Left atrium Value Reference LA ID, A-P, ES 52 mm --------- LA ID/bsa, A-P (H) 3.73 cm/m^2 <=2.2 LA volume, S 120 ml ---------  LA volume/bsa, S 86.2 ml/m^2 --------- LA volume, ES, 1-p A4C 98.6 ml --------- LA volume/bsa, ES, 1-p A4C 70.8 ml/m^2 --------- LA volume, ES, 1-p A2C 140 ml --------- LA volume/bsa, ES, 1-p A2C 100.6 ml/m^2 ---------  Mitral valve Value Reference Mitral E-wave peak velocity 118 cm/s --------- Mitral mean velocity, D 68.6 cm/s --------- Mitral deceleration time (L) 116 ms 150 - 230 Mitral pressure half-time 76 ms --------- Mitral mean gradient, D 3 mm Hg  --------- Mitral peak gradient, D 6 mm Hg --------- Mitral valve area, PHT, DP 2.82 cm^2 --------- Mitral valve area/bsa, PHT, DP 2.03 cm^2/m^2 --------- Mitral valve area, LVOT 2.24 cm^2 --------- continuity Mitral valve area/bsa, LVOT 1.61 cm^2/m^2 --------- continuity Mitral annulus VTI, D 36.7 cm ---------  Pulmonary arteries Value Reference PA pressure, S, DP (H) 50 mm Hg <=30  Tricuspid valve Value Reference Tricuspid regurg peak velocity 296 cm/s --------- Tricuspid peak RV-RA gradient 35 mm Hg ---------  Systemic veins Value Reference Estimated CVP 15 mm Hg ---------  Right ventricle Value Reference RV pressure, S, DP (H) 50 mm Hg <=30  Legend: (L) and (H) mark values outside specified reference range.  ------------------------------------------------------------------- Hart Carwin, MD [id number]:[id number]:13       Transesophageal Echocardiography  Patient: Pacey, Willadsen MR #: [id number] Study Date: 11/09/2015 Gender: F Age: 10 Height: 160 cm Weight: 44.5 kg BSA: 1.4 m^2 Pt. Status: Room: 3W06C  ATTENDING Daiva Eves, Remi Haggard N SONOGRAPHER Delcie Roch, RDCS, CCT ORDERING Tobias Alexander, M.D. PERFORMING Tobias Alexander, M.D. ADMITTING Tyson Alias  cc:  ------------------------------------------------------------------- LV EF: 40% - 45%  ------------------------------------------------------------------- Indications: Atrial fibrillation -  427.31.  ------------------------------------------------------------------- Study Conclusions  - Left ventricle: Systolic function was mildly to moderately  reduced. The estimated ejection fraction was in the range of 40%  to 45%. Diffuse hypokinesis. - Aortic valve: There is thickening and calcification of the left  coronary cusp. There is also a calcified subaortic membrane seen  about 0.5 cm bellow the oartic valve in the LVOT. The transaortic  gradeints were not calculated on the current study.  The leaflets don&'t coapt well and there is severe aortic  regurgitation. - Left atrium: The atrium was severely dilated. No evidence of  thrombus in the atrial cavity or appendage. No evidence of  thrombus in the atrial cavity or appendage. No evidence of  thrombus in the appendage. - Right atrium: No evidence of thrombus in the atrial cavity or  appendage. - Tricuspid valve: There was mild-moderate regurgitation. - Pulmonic valve: No evidence of vegetation. There was moderate  regurgitation.  Diagnostic transesophageal echocardiography. 2D and color Doppler. Birthdate: Patient birthdate: [birth year]. Age: Patient is 45 yr old. Sex: Gender: female. BMI: 17.4 kg/m^2. Blood pressure:  113/74 Patient status: Inpatient. Study date: Study date: 11/09/2015. Study time: 09:12 AM. Location: Endoscopy.  -------------------------------------------------------------------  ------------------------------------------------------------------- Left ventricle: Systolic function was mildly to moderately reduced. The estimated ejection fraction was in the range of 40% to 45%. Diffuse hypokinesis.  ------------------------------------------------------------------- Aortic valve: There is thickening and calcification of the left coronary cusp. There is also a calcified subaortic membrane seen about 0.5 cm bellow the oartic valve in the LVOT. The  transaortic gradeints were not calculated on the current study. The leaflets don&'t coapt well and there is severe aortic regurgitation. Structurally normal valve. Trileaflet; normal thickness leaflets. Cusp separation was normal. Doppler: There was no significant regurgitation.  ------------------------------------------------------------------- Aorta: The aorta was normal, not dilated, and non-diseased. There was no atheroma. There was no evidence  for dissection. Aortic root: The aortic root was not dilated. Ascending aorta: The ascending aorta was normal in size. Aortic arch: The aortic arch was normal in size. Descending aorta: The descending aorta was normal in size.  ------------------------------------------------------------------- Mitral valve: Structurally normal valve. Leaflet separation was normal. Doppler: There was no significant regurgitation.  ------------------------------------------------------------------- Left atrium: The atrium was severely dilated. No evidence of thrombus in the atrial cavity or appendage. No evidence of thrombus in the atrial cavity or appendage. No evidence of thrombus in the appendage. The appendage was morphologically a left appendage, multilobulated, and of normal size. Emptying velocity was decreased.  ------------------------------------------------------------------- Right ventricle: The cavity size was normal. Wall thickness was normal. Systolic function was normal.  ------------------------------------------------------------------- Pulmonic valve: Structurally normal valve. Cusp separation was normal. No evidence of vegetation. Doppler: There was moderate regurgitation.  ------------------------------------------------------------------- Tricuspid valve: Structurally normal valve. Leaflet separation was normal. Doppler: There was mild-moderate  regurgitation.  ------------------------------------------------------------------- Pulmonary artery: The main pulmonary artery was normal-sized.  ------------------------------------------------------------------- Right atrium: The atrium was normal in size. No evidence of thrombus in the atrial cavity or appendage. The appendage was morphologically a right appendage.  ------------------------------------------------------------------- Pericardium: There was no pericardial effusion.  ------------------------------------------------------------------- Post procedure conclusions Ascending Aorta:  - The aorta was normal, not dilated, and non-diseased.  ------------------------------------------------------------------- Prepared and Electronically Authenticated by  Tobias Alexander, M.D. 2017-03-01T11:25:06    CARDIAC CATHETERIZATION Procedures    Right/Left Heart Cath and Coronary Angiography    Conclusion    1. No angiographic evidence of CAD 2. Elevated right sided pressures.   Recommendations: Further workup in regards to her aortic valve disease per cardiology consult team.     Indications    Aortic valve disease [I35.9 (ICD-10-CM)]    Technique and Indications    Estimated blood loss <50 mL. Indication: Acute CHF, severe aortic valve insufficiency, moderate AS  Procedure: The risks, benefits, complications, treatment options, and expected outcomes were discussed with the patient. The patient and/or family concurred with the proposed plan, giving informed consent. The patient was brought to the cath lab after IV hydration was begun and oral premedication was given. The patient was further sedated with Versed and Fentanyl. There was an IV present in the right antecubital vein. This area was prepped and draped. I then changed this out for a 5 [place] sheath. Right heart catheterization performed with a balloon tipped catheter. The right  wrist was assessed with a modified Allens test which was positive. The right wrist was prepped and draped in a sterile fashion. 1% lidocaine was used for local anesthesia. Using the modified Seldinger access technique, a 5 French sheath was placed in the right radial artery. 3 mg Verapamil was given through the sheath. 3000 units IV heparin was given. Standard diagnostic catheters were used to perform selective coronary angiography. I engaged the RCA with a JR4 catheter and the left main with an EBU 3.0 guiding catheter. I crossed the aortic valve with an AL-1 catheter and straight wire. No LV gram performed. The sheath was removed from the right radial artery and a Terumo hemostasis band was applied at the arteriotomy site on the right wrist.   Sedation: During this procedure the patient is administered a total of Versed 3 mg and Fentanyl 25 mcg to achieve and maintain moderate conscious sedation. The patient's heart rate, blood pressure, and oxygen saturation are monitored continuously during the procedure. The period of conscious sedation is 49 minutes, of which I was present face-to-face 100% of this time.  Coronary Findings    Dominance: Left   Left Anterior Descending  . Vessel is large. Vessel is angiographically normal.   . First Diagonal Branch   The vessel is large in size.   Marland Kitchen Second Diagonal Branch   The vessel is moderate in size.     Left Circumflex  . Vessel is large. Vessel is angiographically normal.   . First Obtuse Marginal Branch   The vessel is small in size.   . Third Obtuse Marginal Branch   The vessel is large in size.   Marland Kitchen Fourth Obtuse Marginal Branch   The vessel is small in size.     Right Coronary Artery  . Vessel is moderate in size. Vessel is angiographically normal.       Right Heart Pressures Hemodynamic findings consistent with severe pulmonary hypertension. Elevated LV EDP consistent with volume  overload.    Coronary Diagrams    Diagnostic Diagram            Implants    No implant documentation for this case.    PACS Images    Show images for Cardiac catheterization     Link to Procedure Log    Procedure Log      Hemo Data       Most Recent Value   Fick Cardiac Output  7.02 L/min   Fick Cardiac Output Index  4.68 (L/min)/BSA   RA A Wave  -99 mmHg   RA V Wave  30 mmHg   RA Mean  25 mmHg   RV Systolic Pressure  94 mmHg   RV Diastolic Pressure  5 mmHg   RV EDP  23 mmHg   PA Systolic Pressure  90 mmHg   PA Diastolic Pressure  44 mmHg   PA Mean  60 mmHg   PW A Wave  -99 mmHg   PW V Wave  20 mmHg   PW Mean  17 mmHg   AO Systolic Pressure  127 mmHg   AO Diastolic Pressure  55 mmHg   AO Mean  80 mmHg   LV Systolic Pressure  142 mmHg   LV Diastolic Pressure  11 mmHg   LV EDP  21 mmHg   Arterial Occlusion Pressure Extended Systolic Pressure  124 mmHg   Arterial Occlusion Pressure Extended Diastolic Pressure  53 mmHg   Arterial Occlusion Pressure Extended Mean Pressure  78 mmHg   Left Ventricular Apex Extended Systolic Pressure  140 mmHg   Left Ventricular Apex Extended Diastolic Pressure  12 mmHg   Left Ventricular Apex Extended EDP Pressure  22 mmHg   QP/QS  1   TPVR Index  12.82 HRUI   TSVR Index  17.1 HRUI   PVR SVR Ratio  0.78   TPVR/TSVR Ratio  0.75       Cardiac/Coronary CT TECHNIQUE: The patient was scanned on a Philips 256 scanner. FINDINGS: A 120 kV prospective scan was triggered in the descending thoracic aorta at 111 HU's. Axial non-contrast 3 mm slices were carried out through the heart. The data set was analyzed on a dedicated work station and scored using the Agatson method. Gantry rotation speed was 270 msecs and collimation was .9 mm. No beta  blockade or NTG were given. The 3D data set was reconstructed in 5% intervals of the 67-82 % of the R-R cycle. Diastolic phases were analyzed on a dedicated work station using MPR, MIP and VRT modes. The patient received 80 cc of contrast. Aorta: Normal size of  the aortic root (32 x 31 x 29 mm) and visualized portion of the ascending aorta. Aortic Valve: Trileaflet, mildly calcified, focal calcification of the left coronary cusp. There is a subaortic membrane present in the LVOT located 6 mm bellow the non-coronary cusp. Coronary Arteries: Normal origin. Left dominance. No evidence of CAD. Dilated pulmonary artery measuring 35 mm consistent with pulmonary hypertension. There are 4 pulmonary veins that are draining normally into the left atrium. There is no anomalous pulmonary vein drainage. No ASD or VSD is seen. IMPRESSION: 1. Normal size of the aortic root and visualized portion of the ascending aorta. 2. Mildly thickened and calcified trileaflet aortic valve with leaflet non-coaptation and subaortic membrane located 6 mm bellow the non-coronary cusp. 3. No ASD or VSD seen. Normal pulmonary vein drainage in the LVOT. 4. Dilated pulmonary artery consistent with pulmonary hypertension. 5. Normal coronary origin with left dominance. No CAD. Tobias Alexander Electronically Signed  By: Tobias Alexander  On: 01/12/2016 16:54     Study Result     EXAM: OVER-READ INTERPRETATION CT CHEST  The following report is an over-read performed by radiologist Dr. Irma Newness [facility] on 01/12/2016. This over-read does not include interpretation of cardiac or coronary anatomy or pathology. The coronary CTA interpretation by the cardiologist is attached.  COMPARISON: Chest radiographs today and 01/08/2016.  FINDINGS: Mediastinum/Nodes: No enlarged mediastinal or hilar lymph nodes are identified. Cardiovascular findings are dictated  separately.  Lungs/Pleura: There are moderate-sized pleural effusions, larger on the right. There is associated dependent atelectasis at both lung bases. In addition, there are patchy ground-glass opacities in both lungs, probably a combination of edema and atelectasis.  Upper abdomen: There is prominent reflux of contrast into the IVC and hepatic veins. The hepatic veins appear mildly dilated.  Musculoskeletal: Generalized soft tissue edema suspicious for anasarca. No focal osseous abnormalities are seen.  IMPRESSION: 1. Cardiomegaly, pulmonary edema and bilateral pleural effusions consistent with congestive heart failure. 2. Associated atelectasis at both lung bases. 3. Cardiovascular findings dictated separately  Electronically Signed: By: Carey Bullocks M.D. 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 She describes a long progression of symptoms of exertional shortness of breath, orthopnea, PND and lower extremity edema consistent with chronic combined systolic and diastolic congestive heart failure. She has had 2 recent hospitalizations for acute exacerbation of class IV symptoms, both of which included recurrent atrial fibrillation and chest pain. She has been doing well since her most recent hospital discharge and she returns to our office with hopes to proceed with definitive surgical intervention in the near future.    Plan:  The patient was again counseled at length regarding treatment alternatives for management of severe aortic insufficiency with subvalvular aortic stenosis. The indications, risks, potential benefits of aortic valve replacement with resection of subvalvar aortic stenosis had been discussed. Surgical options were discussed at length including conventional surgical aortic valve replacement through either a full  median sternotomy or using minimally invasive techniques. Other alternatives including rapid-deployment bioprosthetic tissue valve replacement, transcatheter aortic valve replacement, patch enlargement of the aortic root, stentless porcine aortic root replacement, valve repair, the Ross autograft procedure, and homograft aortic root replacement were discussed. Discussion was held comparing the relative risks of mechanical valve replacement with need for lifelong anticoagulation versus use of a bioprosthetic tissue valve and the associated potential for late structural valve  deterioration and failure. This discussion was placed in the context of the patient's particular circumstances, and as a result the patient specifically requests that their valve be replaced using a mechanical prosthetic valve. The relative risks and benefits of performing a maze procedure at the time of their surgery was discussed at length, including the expected likelihood of long term freedom from recurrent symptomatic atrial fibrillation and/or atrial flutter. The patient understands and accepts all potential associated risks of surgery including but not limited to risk of death, stroke, myocardial infarction, congestive heart failure, respiratory failure, renal failure, pneumonia, bleeding requiring blood transfusion and or reexploration, arrhythmia, heart block or bradycardia requiring permanent pacemaker, aortic dissection or other major vascular complication, pleural effusions or other delayed complications related to continued congestive heart failure, and other late complications related to valve replacement including structural valve deterioration and failure, thrombosis, endocarditis, or paravalvular leak. We plan to proceed with surgery on Thursday, 02/09/2016. The patient has been instructed to remain off of Xarelto between now and the time of surgery.    I spent in excess of 15 minutes during the conduct of this office  consultation and >50% of this time involved direct face-to-face encounter with the patient for counseling and/or coordination of their care.    Salvatore Decent. Cornelius Moras, MD 01/30/2016 11:33 AM

## 2016-02-07 NOTE — Patient Instructions (Addendum)
PLAN: 1. Use salt water rinses as needed to aid healing. 2. Brush teeth after meals and at bedtime. Brush tongue daily. 3. Advance diet as tolerated. 4. Patient is cleared for heart valve surgery as scheduled. 5. Patient is aware of need to follow-up with a primary dentist for an exam, additional periodontal therapy, dental restorations, and evaluation for replacement of missing teeth once she is medically stable from the anticipated heart valve surgery. Patient is aware that she will need antibiotic premedication prior to invasive dental procedures as per American Heart Association guidelines.  Charlynne Pander, DDS

## 2016-02-07 NOTE — Telephone Encounter (Signed)
Called pt for Nada Boozer, NP, to let the pt know that Dr. Delton See said that she should see a eye dr and that she didn't need a referral. Left a detailed pt on her voicemail.

## 2016-02-07 NOTE — Progress Notes (Signed)
POST OPERATIVE NOTE:  02/07/2016 Sheila Branch [id number]  VITALS: BP 151/34 mmHg  Pulse 62  Temp(Src) 97.5 F (36.4 C) (Oral)  LMP 12/12/2015 (Approximate)  LABS:  Lab Results  Component Value Date   WBC 6.9 01/15/2016   HGB 11.5* 01/15/2016   HCT 36.3 01/15/2016   MCV 92.4 01/15/2016   PLT 185 01/15/2016   BMET    Component Value Date/Time   NA 137 01/14/2016 0322   K 4.6 01/14/2016 0322   CL 97* 01/14/2016 0322   CO2 30 01/14/2016 0322   GLUCOSE 91 01/14/2016 0322   BUN 18 01/14/2016 0322   CREATININE 0.87 01/14/2016 0322   CREATININE 0.71 01/04/2016 1349   CALCIUM 8.7* 01/14/2016 0322   GFRNONAA >60 01/14/2016 0322   GFRAA >60 01/14/2016 0322    Lab Results  Component Value Date   INR 1.12 01/25/2016   INR 1.41 01/04/2016   INR 1.14 11/03/2015   No results found for: PTT   Sheila Branch is status post multiple extractions with alveoloplasty and gross debridement of remaining dentition on 01/25/16 in the operating room with general anesthesia. Patient now presents for evaluation of healing and suture removal as indicated.  SUBJECTIVE: Patient denies having significant discomfort from previous extractions. Patient is eating a softer diet and was able to eat a cheeseburger yesterday.  EXAM: There is no sign of infection, heme, or ooze. Patient is healing in by generalized primary closure. No sutures remain.  PROCEDURE: The patient was given a chlorhexidine gluconate rinse for 30 seconds. No sutures were present.  ASSESSMENT: Post operative course is consistent with dental procedures performed in the operating room with general anesthesia.   PLAN: 1. Use salt water rinses as needed to aid healing. 2. Brush teeth after meals and at bedtime. Brush tongue daily. 3. Advance diet as tolerated. 4. Patient is cleared for heart valve surgery as scheduled. 5. Patient is aware of need to follow-up with a primary dentist for an exam, additional periodontal  therapy, dental restorations, and evaluation for replacement of missing teeth once she is medically stable from the anticipated heart valve surgery. Patient is aware that she will need antibiotic premedication prior to invasive dental procedures as per American Heart Association guidelines.  Charlynne Pander, DDS

## 2016-02-08 ENCOUNTER — Encounter (HOSPITAL_COMMUNITY): Payer: Self-pay

## 2016-02-08 ENCOUNTER — Ambulatory Visit (HOSPITAL_COMMUNITY)
Admission: RE | Admit: 2016-02-08 | Discharge: 2016-02-08 | Disposition: A | Discharge status: Home / Self Care | Payer: Self-pay | Source: Ambulatory Visit | Attending: Thoracic Surgery (Cardiothoracic Vascular Surgery) | Admitting: Thoracic Surgery (Cardiothoracic Vascular Surgery)

## 2016-02-08 ENCOUNTER — Encounter: Payer: Self-pay | Admitting: Cardiology

## 2016-02-08 ENCOUNTER — Encounter (HOSPITAL_COMMUNITY)
Admission: RE | Admit: 2016-02-08 | Discharge: 2016-02-08 | Disposition: A | Discharge status: Home / Self Care | Payer: Self-pay | Source: Ambulatory Visit | Attending: Thoracic Surgery (Cardiothoracic Vascular Surgery) | Admitting: Thoracic Surgery (Cardiothoracic Vascular Surgery)

## 2016-02-08 ENCOUNTER — Ambulatory Visit (INDEPENDENT_AMBULATORY_CARE_PROVIDER_SITE_OTHER): Payer: Self-pay | Admitting: Cardiology

## 2016-02-08 VITALS — BP 130/102 | HR 64 | Ht 63.5 in | Wt 90.8 lb

## 2016-02-08 VITALS — BP 119/42 | HR 64 | Temp 97.4°F | Resp 18 | Ht 63.5 in | Wt 91.3 lb

## 2016-02-08 DIAGNOSIS — I359 Nonrheumatic aortic valve disorder, unspecified: Secondary | ICD-10-CM

## 2016-02-08 DIAGNOSIS — R0602 Shortness of breath: Secondary | ICD-10-CM

## 2016-02-08 DIAGNOSIS — I351 Nonrheumatic aortic (valve) insufficiency: Secondary | ICD-10-CM

## 2016-02-08 DIAGNOSIS — Q249 Congenital malformation of heart, unspecified: Secondary | ICD-10-CM

## 2016-02-08 DIAGNOSIS — I4891 Unspecified atrial fibrillation: Secondary | ICD-10-CM

## 2016-02-08 DIAGNOSIS — Z01812 Encounter for preprocedural laboratory examination: Secondary | ICD-10-CM | POA: Insufficient documentation

## 2016-02-08 DIAGNOSIS — I4892 Unspecified atrial flutter: Secondary | ICD-10-CM

## 2016-02-08 DIAGNOSIS — I5042 Chronic combined systolic (congestive) and diastolic (congestive) heart failure: Secondary | ICD-10-CM

## 2016-02-08 DIAGNOSIS — Q244 Congenital subaortic stenosis: Secondary | ICD-10-CM

## 2016-02-08 DIAGNOSIS — Z01818 Encounter for other preprocedural examination: Secondary | ICD-10-CM | POA: Insufficient documentation

## 2016-02-08 DIAGNOSIS — R0989 Other specified symptoms and signs involving the circulatory and respiratory systems: Secondary | ICD-10-CM | POA: Insufficient documentation

## 2016-02-08 LAB — URINALYSIS, ROUTINE W REFLEX MICROSCOPIC
Bilirubin Urine: NEGATIVE
GLUCOSE, UA: NEGATIVE mg/dL
HGB URINE DIPSTICK: NEGATIVE
KETONES UR: NEGATIVE mg/dL
Leukocytes, UA: NEGATIVE
Nitrite: NEGATIVE
PROTEIN: NEGATIVE mg/dL
Specific Gravity, Urine: 1.012 (ref 1.005–1.030)
pH: 6 (ref 5.0–8.0)

## 2016-02-08 LAB — HCG, SERUM, QUALITATIVE: Preg, Serum: NEGATIVE

## 2016-02-08 LAB — BLOOD GAS, ARTERIAL
ACID-BASE EXCESS: 2.6 mmol/L — AB (ref 0.0–2.0)
BICARBONATE: 26.1 meq/L — AB (ref 20.0–24.0)
Drawn by: 206361
FIO2: 0.21
O2 SAT: 96.1 %
PO2 ART: 83.3 mmHg (ref 80.0–100.0)
Patient temperature: 98.6
TCO2: 27.2 mmol/L (ref 0–100)
pCO2 arterial: 36.6 mmHg (ref 35.0–45.0)
pH, Arterial: 7.467 — ABNORMAL HIGH (ref 7.350–7.450)

## 2016-02-08 LAB — COMPREHENSIVE METABOLIC PANEL WITH GFR
ALT: 18 U/L (ref 14–54)
AST: 27 U/L (ref 15–41)
Albumin: 3.7 g/dL (ref 3.5–5.0)
Alkaline Phosphatase: 72 U/L (ref 38–126)
Anion gap: 12 (ref 5–15)
BUN: 16 mg/dL (ref 6–20)
CO2: 23 mmol/L (ref 22–32)
Calcium: 9.3 mg/dL (ref 8.9–10.3)
Chloride: 101 mmol/L (ref 101–111)
Creatinine, Ser: 0.77 mg/dL (ref 0.44–1.00)
GFR calc Af Amer: >60 mL/min
GFR calc non Af Amer: >60 mL/min
Glucose, Bld: 88 mg/dL (ref 65–99)
Potassium: 4 mmol/L (ref 3.5–5.1)
Sodium: 136 mmol/L (ref 135–145)
Total Bilirubin: 1 mg/dL (ref 0.3–1.2)
Total Protein: 7.4 g/dL (ref 6.5–8.1)

## 2016-02-08 LAB — CBC
HCT: 37.6 % (ref 36.0–46.0)
Hemoglobin: 12.7 g/dL (ref 12.0–15.0)
MCH: 30 pg (ref 26.0–34.0)
MCHC: 33.8 g/dL (ref 30.0–36.0)
MCV: 88.7 fL (ref 78.0–100.0)
PLATELETS: 244 10*3/uL (ref 150–400)
RBC: 4.24 MIL/uL (ref 3.87–5.11)
RDW: 15.3 % (ref 11.5–15.5)
WBC: 8 10*3/uL (ref 4.0–10.5)

## 2016-02-08 LAB — PROTIME-INR
INR: 1.28 (ref 0.00–1.49)
Prothrombin Time: 16.1 s — ABNORMAL HIGH (ref 11.6–15.2)

## 2016-02-08 LAB — ABO/RH: ABO/RH(D): O POS

## 2016-02-08 LAB — APTT: aPTT: 31 s (ref 24–37)

## 2016-02-08 MED ORDER — METOPROLOL TARTRATE 12.5 MG HALF TABLET
12.5000 mg | ORAL_TABLET | Freq: Once | ORAL | Status: AC
  Administered 2016-02-09: 12.5 mg via ORAL
  Filled 2016-02-08: qty 1

## 2016-02-08 MED ORDER — DEXMEDETOMIDINE HCL IN NACL 400 MCG/100ML IV SOLN
0.1000 ug/kg/h | INTRAVENOUS | Status: DC
  Filled 2016-02-08: qty 100

## 2016-02-08 MED ORDER — SODIUM CHLORIDE 0.9 % IV SOLN
INTRAVENOUS | Status: AC
  Administered 2016-02-09: 69.8 mL/h via INTRAVENOUS
  Administered 2016-02-09: 13:00:00 via INTRAVENOUS
  Filled 2016-02-08: qty 40

## 2016-02-08 MED ORDER — VANCOMYCIN HCL IN DEXTROSE 1-5 GM/200ML-% IV SOLN
1000.0000 mg | INTRAVENOUS | Status: DC
  Filled 2016-02-08: qty 200

## 2016-02-08 MED ORDER — VANCOMYCIN HCL 1000 MG IV SOLR
INTRAVENOUS | Status: DC
  Filled 2016-02-08: qty 1000

## 2016-02-08 MED ORDER — PLASMA-LYTE 148 IV SOLN
INTRAVENOUS | Status: DC
  Filled 2016-02-08: qty 2.5

## 2016-02-08 MED ORDER — MAGNESIUM SULFATE 50 % IJ SOLN
40.0000 meq | INTRAMUSCULAR | Status: DC
  Filled 2016-02-08: qty 10

## 2016-02-08 MED ORDER — PHENYLEPHRINE HCL 10 MG/ML IJ SOLN
30.0000 ug/min | INTRAVENOUS | Status: DC
  Filled 2016-02-08: qty 2

## 2016-02-08 MED ORDER — EPINEPHRINE HCL 1 MG/ML IJ SOLN
0.0000 ug/min | INTRAVENOUS | Status: DC
  Filled 2016-02-08: qty 4

## 2016-02-08 MED ORDER — LEVOFLOXACIN IN D5W 500 MG/100ML IV SOLN
500.0000 mg | INTRAVENOUS | Status: AC
  Administered 2016-02-09: 500 mg via INTRAVENOUS
  Filled 2016-02-08: qty 100

## 2016-02-08 MED ORDER — SODIUM CHLORIDE 0.9 % IV SOLN
INTRAVENOUS | Status: DC
  Filled 2016-02-08: qty 30

## 2016-02-08 MED ORDER — POTASSIUM CHLORIDE 2 MEQ/ML IV SOLN
80.0000 meq | INTRAVENOUS | Status: DC
  Filled 2016-02-08: qty 40

## 2016-02-08 MED ORDER — DOPAMINE-DEXTROSE 3.2-5 MG/ML-% IV SOLN
0.0000 ug/kg/min | INTRAVENOUS | Status: DC
  Filled 2016-02-08: qty 250

## 2016-02-08 MED ORDER — CHLORHEXIDINE GLUCONATE 0.12 % MT SOLN
15.0000 mL | Freq: Once | OROMUCOSAL | Status: AC
  Administered 2016-02-09: 15 mL via OROMUCOSAL
  Filled 2016-02-08: qty 15

## 2016-02-08 MED ORDER — INSULIN REGULAR HUMAN 100 UNIT/ML IJ SOLN
INTRAMUSCULAR | Status: DC
  Filled 2016-02-08: qty 2.5

## 2016-02-08 MED ORDER — NITROGLYCERIN IN D5W 200-5 MCG/ML-% IV SOLN
2.0000 ug/min | INTRAVENOUS | Status: DC
  Filled 2016-02-08: qty 250

## 2016-02-08 NOTE — Patient Instructions (Signed)
Your physician recommends that you continue on your current medications as directed. Please refer to the Current Medication list given to you today.    Your physician recommends that you schedule a follow-up appointment in:   AFTER YOUR VALVE SURGERY---SURGEON WILL SCHEDULE YOUR FOLLOW-UP APPOINTMENT WITH DR Delton See ACCORDINGLY

## 2016-02-09 ENCOUNTER — Inpatient Hospital Stay (HOSPITAL_COMMUNITY): Payer: Self-pay | Admitting: Certified Registered Nurse Anesthetist

## 2016-02-09 ENCOUNTER — Inpatient Hospital Stay (HOSPITAL_COMMUNITY): Payer: Self-pay

## 2016-02-09 ENCOUNTER — Encounter (HOSPITAL_COMMUNITY)
Admission: RE | Disposition: A | Discharge status: Home / Self Care | Payer: Self-pay | Source: Ambulatory Visit | Attending: Thoracic Surgery (Cardiothoracic Vascular Surgery)

## 2016-02-09 ENCOUNTER — Inpatient Hospital Stay (HOSPITAL_COMMUNITY): Payer: Self-pay | Admitting: Emergency Medicine

## 2016-02-09 ENCOUNTER — Inpatient Hospital Stay (HOSPITAL_COMMUNITY)
Admission: RE | Admit: 2016-02-09 | Discharge: 2016-02-17 | DRG: 219 | Disposition: A | Discharge status: Home / Self Care | Payer: Self-pay | Source: Ambulatory Visit | Attending: Thoracic Surgery (Cardiothoracic Vascular Surgery) | Admitting: Thoracic Surgery (Cardiothoracic Vascular Surgery)

## 2016-02-09 ENCOUNTER — Encounter (HOSPITAL_COMMUNITY): Payer: Self-pay | Admitting: *Deleted

## 2016-02-09 DIAGNOSIS — Q250 Patent ductus arteriosus: Secondary | ICD-10-CM

## 2016-02-09 DIAGNOSIS — T502X5A Adverse effect of carbonic-anhydrase inhibitors, benzothiadiazides and other diuretics, initial encounter: Secondary | ICD-10-CM | POA: Diagnosis present

## 2016-02-09 DIAGNOSIS — R7303 Prediabetes: Secondary | ICD-10-CM | POA: Diagnosis present

## 2016-02-09 DIAGNOSIS — Z79891 Long term (current) use of opiate analgesic: Secondary | ICD-10-CM

## 2016-02-09 DIAGNOSIS — I5033 Acute on chronic diastolic (congestive) heart failure: Secondary | ICD-10-CM | POA: Diagnosis present

## 2016-02-09 DIAGNOSIS — J449 Chronic obstructive pulmonary disease, unspecified: Secondary | ICD-10-CM | POA: Diagnosis present

## 2016-02-09 DIAGNOSIS — I351 Nonrheumatic aortic (valve) insufficiency: Secondary | ICD-10-CM

## 2016-02-09 DIAGNOSIS — J9811 Atelectasis: Secondary | ICD-10-CM | POA: Diagnosis not present

## 2016-02-09 DIAGNOSIS — Z7901 Long term (current) use of anticoagulants: Secondary | ICD-10-CM

## 2016-02-09 DIAGNOSIS — I110 Hypertensive heart disease with heart failure: Secondary | ICD-10-CM | POA: Diagnosis present

## 2016-02-09 DIAGNOSIS — Z87891 Personal history of nicotine dependence: Secondary | ICD-10-CM

## 2016-02-09 DIAGNOSIS — I5042 Chronic combined systolic (congestive) and diastolic (congestive) heart failure: Secondary | ICD-10-CM | POA: Diagnosis present

## 2016-02-09 DIAGNOSIS — Z954 Presence of other heart-valve replacement: Secondary | ICD-10-CM

## 2016-02-09 DIAGNOSIS — Z9889 Other specified postprocedural states: Secondary | ICD-10-CM

## 2016-02-09 DIAGNOSIS — I352 Nonrheumatic aortic (valve) stenosis with insufficiency: Principal | ICD-10-CM | POA: Diagnosis present

## 2016-02-09 DIAGNOSIS — Z681 Body mass index (BMI) 19 or less, adult: Secondary | ICD-10-CM

## 2016-02-09 DIAGNOSIS — Q249 Congenital malformation of heart, unspecified: Secondary | ICD-10-CM

## 2016-02-09 DIAGNOSIS — Z8679 Personal history of other diseases of the circulatory system: Secondary | ICD-10-CM

## 2016-02-09 DIAGNOSIS — Q244 Congenital subaortic stenosis: Secondary | ICD-10-CM

## 2016-02-09 DIAGNOSIS — N179 Acute kidney failure, unspecified: Secondary | ICD-10-CM | POA: Diagnosis not present

## 2016-02-09 DIAGNOSIS — I4891 Unspecified atrial fibrillation: Secondary | ICD-10-CM | POA: Diagnosis present

## 2016-02-09 DIAGNOSIS — Z8774 Personal history of (corrected) congenital malformations of heart and circulatory system: Secondary | ICD-10-CM

## 2016-02-09 DIAGNOSIS — I509 Heart failure, unspecified: Secondary | ICD-10-CM

## 2016-02-09 DIAGNOSIS — D6959 Other secondary thrombocytopenia: Secondary | ICD-10-CM | POA: Diagnosis not present

## 2016-02-09 DIAGNOSIS — I350 Nonrheumatic aortic (valve) stenosis: Secondary | ICD-10-CM

## 2016-02-09 DIAGNOSIS — I5043 Acute on chronic combined systolic (congestive) and diastolic (congestive) heart failure: Secondary | ICD-10-CM | POA: Diagnosis present

## 2016-02-09 DIAGNOSIS — I272 Other secondary pulmonary hypertension: Secondary | ICD-10-CM | POA: Diagnosis present

## 2016-02-09 DIAGNOSIS — Z79899 Other long term (current) drug therapy: Secondary | ICD-10-CM

## 2016-02-09 DIAGNOSIS — F05 Delirium due to known physiological condition: Secondary | ICD-10-CM | POA: Diagnosis not present

## 2016-02-09 DIAGNOSIS — I480 Paroxysmal atrial fibrillation: Secondary | ICD-10-CM

## 2016-02-09 DIAGNOSIS — D62 Acute posthemorrhagic anemia: Secondary | ICD-10-CM | POA: Diagnosis not present

## 2016-02-09 DIAGNOSIS — I481 Persistent atrial fibrillation: Secondary | ICD-10-CM | POA: Diagnosis present

## 2016-02-09 DIAGNOSIS — I359 Nonrheumatic aortic valve disorder, unspecified: Secondary | ICD-10-CM | POA: Diagnosis present

## 2016-02-09 DIAGNOSIS — E876 Hypokalemia: Secondary | ICD-10-CM | POA: Diagnosis not present

## 2016-02-09 DIAGNOSIS — I440 Atrioventricular block, first degree: Secondary | ICD-10-CM | POA: Diagnosis present

## 2016-02-09 DIAGNOSIS — F419 Anxiety disorder, unspecified: Secondary | ICD-10-CM | POA: Diagnosis present

## 2016-02-09 DIAGNOSIS — Z880 Allergy status to penicillin: Secondary | ICD-10-CM

## 2016-02-09 DIAGNOSIS — I4819 Other persistent atrial fibrillation: Secondary | ICD-10-CM | POA: Diagnosis present

## 2016-02-09 DIAGNOSIS — I639 Cerebral infarction, unspecified: Secondary | ICD-10-CM

## 2016-02-09 HISTORY — PX: AORTIC VALVE REPLACEMENT: SHX41

## 2016-02-09 HISTORY — PX: MAZE: SHX5063

## 2016-02-09 HISTORY — DX: Presence of other heart-valve replacement: Z95.4

## 2016-02-09 HISTORY — PX: TEE WITHOUT CARDIOVERSION: SHX5443

## 2016-02-09 HISTORY — DX: Personal history of (corrected) congenital malformations of heart and circulatory system: Z87.74

## 2016-02-09 HISTORY — PX: PATENT DUCTUS ARTERIOUS REPAIR: SHX269

## 2016-02-09 LAB — POCT I-STAT 3, ART BLOOD GAS (G3+)
ACID-BASE DEFICIT: 2 mmol/L (ref 0.0–2.0)
ACID-BASE EXCESS: 5 mmol/L — AB (ref 0.0–2.0)
Acid-Base Excess: 4 mmol/L — ABNORMAL HIGH (ref 0.0–2.0)
Acid-base deficit: 2 mmol/L (ref 0.0–2.0)
BICARBONATE: 28 meq/L — AB (ref 20.0–24.0)
BICARBONATE: 23.7 meq/L (ref 20.0–24.0)
BICARBONATE: 23.7 meq/L (ref 20.0–24.0)
Bicarbonate: 30.3 mEq/L — ABNORMAL HIGH (ref 20.0–24.0)
Bicarbonate: 24 mEq/L (ref 20.0–24.0)
O2 SAT: 100 %
O2 SAT: 100 %
O2 SAT: 100 %
O2 SAT: 99 %
O2 Saturation: 100 %
PCO2 ART: 54.8 mmHg — AB (ref 35.0–45.0)
PCO2 ART: 31.3 mmHg — AB (ref 35.0–45.0)
PH ART: 7.351 (ref 7.350–7.450)
PH ART: 7.559 — AB (ref 7.350–7.450)
TCO2: 32 mmol/L (ref 0–100)
TCO2: 29 mmol/L (ref 0–100)
TCO2: 25 mmol/L (ref 0–100)
TCO2: 25 mmol/L (ref 0–100)
TCO2: 25 mmol/L (ref 0–100)
pCO2 arterial: 31.9 mmHg — ABNORMAL LOW (ref 35.0–45.0)
pCO2 arterial: 43.5 mmHg (ref 35.0–45.0)
pCO2 arterial: 47.5 mmHg — ABNORMAL HIGH (ref 35.0–45.0)
pH, Arterial: 7.478 — ABNORMAL HIGH (ref 7.350–7.450)
pH, Arterial: 7.344 — ABNORMAL LOW (ref 7.350–7.450)
pH, Arterial: 7.315 — ABNORMAL LOW (ref 7.350–7.450)
pO2, Arterial: 434 mmHg — ABNORMAL HIGH (ref 80.0–100.0)
pO2, Arterial: 466 mmHg — ABNORMAL HIGH (ref 80.0–100.0)
pO2, Arterial: 507 mmHg — ABNORMAL HIGH (ref 80.0–100.0)
pO2, Arterial: 431 mmHg — ABNORMAL HIGH (ref 80.0–100.0)
pO2, Arterial: 161 mmHg — ABNORMAL HIGH (ref 80.0–100.0)

## 2016-02-09 LAB — POCT I-STAT, CHEM 8
BUN: 14 mg/dL (ref 6–20)
BUN: 19 mg/dL (ref 6–20)
BUN: 18 mg/dL (ref 6–20)
BUN: 20 mg/dL (ref 6–20)
BUN: 21 mg/dL — ABNORMAL HIGH (ref 6–20)
BUN: 18 mg/dL (ref 6–20)
BUN: 16 mg/dL (ref 6–20)
CALCIUM ION: 0.82 mmol/L — AB (ref 1.12–1.23)
CALCIUM ION: 1.13 mmol/L (ref 1.12–1.23)
CALCIUM ION: 0.88 mmol/L — AB (ref 1.12–1.23)
CALCIUM ION: 1.05 mmol/L — AB (ref 1.12–1.23)
CHLORIDE: 100 mmol/L — AB (ref 101–111)
CHLORIDE: 98 mmol/L — AB (ref 101–111)
CREATININE: 0.5 mg/dL (ref 0.44–1.00)
Calcium, Ion: 0.93 mmol/L — ABNORMAL LOW (ref 1.12–1.23)
Calcium, Ion: 0.88 mmol/L — ABNORMAL LOW (ref 1.12–1.23)
Calcium, Ion: 0.85 mmol/L — ABNORMAL LOW (ref 1.12–1.23)
Chloride: 96 mmol/L — ABNORMAL LOW (ref 101–111)
Chloride: 104 mmol/L (ref 101–111)
Chloride: 99 mmol/L — ABNORMAL LOW (ref 101–111)
Chloride: 100 mmol/L — ABNORMAL LOW (ref 101–111)
Chloride: 107 mmol/L (ref 101–111)
Creatinine, Ser: 0.3 mg/dL — ABNORMAL LOW (ref 0.44–1.00)
Creatinine, Ser: 0.5 mg/dL (ref 0.44–1.00)
Creatinine, Ser: 0.6 mg/dL (ref 0.44–1.00)
Creatinine, Ser: 0.6 mg/dL (ref 0.44–1.00)
Creatinine, Ser: 0.6 mg/dL (ref 0.44–1.00)
Creatinine, Ser: 1 mg/dL (ref 0.44–1.00)
GLUCOSE: 122 mg/dL — AB (ref 65–99)
GLUCOSE: 308 mg/dL — AB (ref 65–99)
GLUCOSE: 344 mg/dL — AB (ref 65–99)
Glucose, Bld: 93 mg/dL (ref 65–99)
Glucose, Bld: 276 mg/dL — ABNORMAL HIGH (ref 65–99)
Glucose, Bld: 156 mg/dL — ABNORMAL HIGH (ref 65–99)
Glucose, Bld: 126 mg/dL — ABNORMAL HIGH (ref 65–99)
HCT: 33 % — ABNORMAL LOW (ref 36.0–46.0)
HCT: 23 % — ABNORMAL LOW (ref 36.0–46.0)
HCT: 24 % — ABNORMAL LOW (ref 36.0–46.0)
HEMATOCRIT: 25 % — AB (ref 36.0–46.0)
HEMATOCRIT: 20 % — AB (ref 36.0–46.0)
HEMATOCRIT: 24 % — AB (ref 36.0–46.0)
HEMATOCRIT: 30 % — AB (ref 36.0–46.0)
HEMOGLOBIN: 11.2 g/dL — AB (ref 12.0–15.0)
HEMOGLOBIN: 8.5 g/dL — AB (ref 12.0–15.0)
HEMOGLOBIN: 7.8 g/dL — AB (ref 12.0–15.0)
HEMOGLOBIN: 6.8 g/dL — AB (ref 12.0–15.0)
HEMOGLOBIN: 8.2 g/dL — AB (ref 12.0–15.0)
HEMOGLOBIN: 8.2 g/dL — AB (ref 12.0–15.0)
HEMOGLOBIN: 10.2 g/dL — AB (ref 12.0–15.0)
POTASSIUM: 5 mmol/L (ref 3.5–5.1)
Potassium: 3.5 mmol/L (ref 3.5–5.1)
Potassium: 3.5 mmol/L (ref 3.5–5.1)
Potassium: 6.4 mmol/L (ref 3.5–5.1)
Potassium: 4.8 mmol/L (ref 3.5–5.1)
Potassium: 4.6 mmol/L (ref 3.5–5.1)
Potassium: 4.5 mmol/L (ref 3.5–5.1)
SODIUM: 139 mmol/L (ref 135–145)
SODIUM: 141 mmol/L (ref 135–145)
SODIUM: 128 mmol/L — AB (ref 135–145)
SODIUM: 137 mmol/L (ref 135–145)
SODIUM: 140 mmol/L (ref 135–145)
Sodium: 136 mmol/L (ref 135–145)
Sodium: 131 mmol/L — ABNORMAL LOW (ref 135–145)
TCO2: 29 mmol/L (ref 0–100)
TCO2: 30 mmol/L (ref 0–100)
TCO2: 28 mmol/L (ref 0–100)
TCO2: 24 mmol/L (ref 0–100)
TCO2: 22 mmol/L (ref 0–100)
TCO2: 25 mmol/L (ref 0–100)
TCO2: 20 mmol/L (ref 0–100)

## 2016-02-09 LAB — PROTIME-INR
INR: 2.2 — ABNORMAL HIGH (ref 0.00–1.49)
Prothrombin Time: 24.3 seconds — ABNORMAL HIGH (ref 11.6–15.2)

## 2016-02-09 LAB — POCT I-STAT 3, VENOUS BLOOD GAS (G3P V)
Acid-base deficit: 4 mmol/L — ABNORMAL HIGH (ref 0.0–2.0)
BICARBONATE: 23 meq/L (ref 20.0–24.0)
O2 SAT: 77 %
TCO2: 24 mmol/L (ref 0–100)
pCO2, Ven: 48.3 mmHg (ref 45.0–50.0)
pH, Ven: 7.285 (ref 7.250–7.300)
pO2, Ven: 47 mmHg — ABNORMAL HIGH (ref 31.0–45.0)

## 2016-02-09 LAB — CBC
HEMATOCRIT: 32.3 % — AB (ref 36.0–46.0)
HEMATOCRIT: 32.4 % — AB (ref 36.0–46.0)
HEMOGLOBIN: 10.5 g/dL — AB (ref 12.0–15.0)
Hemoglobin: 10.4 g/dL — ABNORMAL LOW (ref 12.0–15.0)
MCH: 29.5 pg (ref 26.0–34.0)
MCH: 29.1 pg (ref 26.0–34.0)
MCHC: 32.5 g/dL (ref 30.0–36.0)
MCHC: 32.1 g/dL (ref 30.0–36.0)
MCV: 90.7 fL (ref 78.0–100.0)
MCV: 90.5 fL (ref 78.0–100.0)
Platelets: 83 10*3/uL — ABNORMAL LOW (ref 150–400)
Platelets: 94 10*3/uL — ABNORMAL LOW (ref 150–400)
RBC: 3.56 MIL/uL — AB (ref 3.87–5.11)
RBC: 3.58 MIL/uL — ABNORMAL LOW (ref 3.87–5.11)
RDW: 15.4 % (ref 11.5–15.5)
RDW: 15.6 % — AB (ref 11.5–15.5)
WBC: 10.4 10*3/uL (ref 4.0–10.5)
WBC: 10.5 10*3/uL (ref 4.0–10.5)

## 2016-02-09 LAB — PREPARE RBC (CROSSMATCH)

## 2016-02-09 LAB — GLUCOSE, CAPILLARY
GLUCOSE-CAPILLARY: 95 mg/dL (ref 65–99)
GLUCOSE-CAPILLARY: 79 mg/dL (ref 65–99)
GLUCOSE-CAPILLARY: 114 mg/dL — AB (ref 65–99)
Glucose-Capillary: 56 mg/dL — ABNORMAL LOW (ref 65–99)
Glucose-Capillary: 95 mg/dL (ref 65–99)

## 2016-02-09 LAB — POCT I-STAT 4, (NA,K, GLUC, HGB,HCT)
Glucose, Bld: 68 mg/dL (ref 65–99)
HEMATOCRIT: 28 % — AB (ref 36.0–46.0)
HEMOGLOBIN: 9.5 g/dL — AB (ref 12.0–15.0)
POTASSIUM: 4.1 mmol/L (ref 3.5–5.1)
SODIUM: 137 mmol/L (ref 135–145)

## 2016-02-09 LAB — HEMOGLOBIN AND HEMATOCRIT, BLOOD
HCT: 25.5 % — ABNORMAL LOW (ref 36.0–46.0)
HEMOGLOBIN: 8.3 g/dL — AB (ref 12.0–15.0)

## 2016-02-09 LAB — PLATELET COUNT: Platelets: 83 10*3/uL — ABNORMAL LOW (ref 150–400)

## 2016-02-09 LAB — MAGNESIUM: MAGNESIUM: 3.9 mg/dL — AB (ref 1.7–2.4)

## 2016-02-09 LAB — CREATININE, SERUM
CREATININE: 1.16 mg/dL — AB (ref 0.44–1.00)
GFR calc Af Amer: >60 mL/min (ref >60)
GFR calc non Af Amer: 57 mL/min — ABNORMAL LOW (ref >60)

## 2016-02-09 LAB — APTT: aPTT: 41 seconds — ABNORMAL HIGH (ref 24–37)

## 2016-02-09 SURGERY — REPLACEMENT, AORTIC VALVE, OPEN
Anesthesia: General | Site: Chest

## 2016-02-09 MED ORDER — SODIUM CHLORIDE 0.9 % IV SOLN
INTRAVENOUS | Status: DC
  Administered 2016-02-09: 15:00:00 via INTRAVENOUS

## 2016-02-09 MED ORDER — MIDAZOLAM HCL 10 MG/2ML IJ SOLN
INTRAMUSCULAR | Status: AC
  Filled 2016-02-09: qty 2

## 2016-02-09 MED ORDER — FENTANYL CITRATE (PF) 100 MCG/2ML IJ SOLN
INTRAMUSCULAR | Status: DC | PRN
  Administered 2016-02-09: 25 ug via INTRAVENOUS
  Administered 2016-02-09: 150 ug via INTRAVENOUS
  Administered 2016-02-09: 25 ug via INTRAVENOUS
  Administered 2016-02-09 (×3): 100 ug via INTRAVENOUS
  Administered 2016-02-09 (×2): 50 ug via INTRAVENOUS
  Administered 2016-02-09: 150 ug via INTRAVENOUS
  Administered 2016-02-09: 100 ug via INTRAVENOUS
  Administered 2016-02-09: 50 ug via INTRAVENOUS
  Administered 2016-02-09: 100 ug via INTRAVENOUS
  Administered 2016-02-09: 250 ug via INTRAVENOUS

## 2016-02-09 MED ORDER — GLYCOPYRROLATE 0.2 MG/ML IJ SOLN
INTRAMUSCULAR | Status: DC | PRN
  Administered 2016-02-09: .1 mg via INTRAVENOUS

## 2016-02-09 MED ORDER — NITROGLYCERIN 0.2 MG/ML ON CALL CATH LAB
INTRAVENOUS | Status: DC | PRN
  Administered 2016-02-09: 40 ug via INTRAVENOUS

## 2016-02-09 MED ORDER — POTASSIUM CHLORIDE 10 MEQ/50ML IV SOLN: 10.0000 meq | INTRAVENOUS | Status: AC

## 2016-02-09 MED ORDER — PHENYLEPHRINE HCL 10 MG/ML IJ SOLN
INTRAMUSCULAR | Status: AC
  Filled 2016-02-09: qty 1

## 2016-02-09 MED ORDER — TRAMADOL HCL 50 MG PO TABS: 50.0000 mg | ORAL_TABLET | ORAL | Status: DC | PRN

## 2016-02-09 MED ORDER — NITROGLYCERIN IN D5W 200-5 MCG/ML-% IV SOLN: 0.0000 ug/min | INTRAVENOUS | Status: DC

## 2016-02-09 MED ORDER — SODIUM CHLORIDE 0.9% FLUSH
3.0000 mL | Freq: Two times a day (BID) | INTRAVENOUS | Status: DC
  Administered 2016-02-10 – 2016-02-17 (×13): 3 mL via INTRAVENOUS

## 2016-02-09 MED ORDER — METOPROLOL TARTRATE 12.5 MG HALF TABLET: 12.5000 mg | ORAL_TABLET | Freq: Two times a day (BID) | ORAL | Status: DC

## 2016-02-09 MED ORDER — PANTOPRAZOLE SODIUM 40 MG PO TBEC: 40.0000 mg | DELAYED_RELEASE_TABLET | Freq: Every day | ORAL | Status: DC

## 2016-02-09 MED ORDER — DOCUSATE SODIUM 100 MG PO CAPS: 200.0000 mg | ORAL_CAPSULE | Freq: Every day | ORAL | Status: DC

## 2016-02-09 MED ORDER — PROPOFOL 10 MG/ML IV BOLUS
INTRAVENOUS | Status: DC | PRN
  Administered 2016-02-09: 50 mg via INTRAVENOUS
  Administered 2016-02-09: 30 mg via INTRAVENOUS

## 2016-02-09 MED ORDER — LIDOCAINE HCL (CARDIAC) 20 MG/ML IV SOLN
INTRAVENOUS | Status: DC | PRN
  Administered 2016-02-09: 80 mg via INTRAVENOUS

## 2016-02-09 MED ORDER — INSULIN REGULAR BOLUS VIA INFUSION
0.0000 [IU] | Freq: Three times a day (TID) | INTRAVENOUS | Status: DC
Start: 2016-02-09 — End: 2016-02-10
  Filled 2016-02-09: qty 10

## 2016-02-09 MED ORDER — FENTANYL CITRATE (PF) 250 MCG/5ML IJ SOLN
INTRAMUSCULAR | Status: AC
  Filled 2016-02-09: qty 5

## 2016-02-09 MED ORDER — SODIUM CHLORIDE 0.9 % IV SOLN
250.0000 mL | INTRAVENOUS | Status: DC
  Administered 2016-02-10: 250 mL via INTRAVENOUS

## 2016-02-09 MED ORDER — EPHEDRINE 5 MG/ML INJ
INTRAVENOUS | Status: AC
  Filled 2016-02-09: qty 10

## 2016-02-09 MED ORDER — SODIUM CHLORIDE 0.9 % IV SOLN
INTRAVENOUS | Status: DC
  Filled 2016-02-09: qty 2.5

## 2016-02-09 MED ORDER — PHENYLEPHRINE HCL 10 MG/ML IJ SOLN
INTRAMUSCULAR | Status: AC
  Filled 2016-02-09: qty 2

## 2016-02-09 MED ORDER — PHENYLEPHRINE HCL 10 MG/ML IJ SOLN
0.0000 ug/min | INTRAVENOUS | Status: DC
  Administered 2016-02-10: 30 ug/min via INTRAVENOUS
  Filled 2016-02-09 (×2): qty 2

## 2016-02-09 MED ORDER — SODIUM CHLORIDE 0.9 % IV SOLN: INTRAVENOUS | Status: DC

## 2016-02-09 MED ORDER — MIDAZOLAM HCL 5 MG/5ML IJ SOLN
INTRAMUSCULAR | Status: DC | PRN
  Administered 2016-02-09 (×3): 1 mg via INTRAVENOUS
  Administered 2016-02-09: 3 mg via INTRAVENOUS
  Administered 2016-02-09 (×2): 2 mg via INTRAVENOUS

## 2016-02-09 MED ORDER — PHENYLEPHRINE HCL 10 MG/ML IJ SOLN
10.0000 mg | INTRAVENOUS | Status: DC | PRN
  Administered 2016-02-09: 20 ug/min via INTRAVENOUS

## 2016-02-09 MED ORDER — ASPIRIN 81 MG PO CHEW: 324.0000 mg | CHEWABLE_TABLET | Freq: Every day | ORAL | Status: DC

## 2016-02-09 MED ORDER — PHENYLEPHRINE HCL 10 MG/ML IJ SOLN
20.0000 mg | INTRAVENOUS | Status: DC | PRN
  Administered 2016-02-09: 25 ug/min via INTRAVENOUS

## 2016-02-09 MED ORDER — METOPROLOL TARTRATE 5 MG/5ML IV SOLN
2.5000 mg | INTRAVENOUS | Status: DC | PRN
  Administered 2016-02-10 – 2016-02-13 (×2): 5 mg via INTRAVENOUS
  Filled 2016-02-09 (×2): qty 5

## 2016-02-09 MED ORDER — DEXTROSE 50 % IV SOLN
INTRAVENOUS | Status: AC
  Filled 2016-02-09: qty 50

## 2016-02-09 MED ORDER — LACTATED RINGERS IV SOLN
INTRAVENOUS | Status: DC
  Administered 2016-02-09: 17:00:00 via INTRAVENOUS

## 2016-02-09 MED ORDER — SODIUM CHLORIDE 0.9 % IV SOLN: 10.0000 mL/h | Freq: Once | INTRAVENOUS | Status: DC

## 2016-02-09 MED ORDER — PROTAMINE SULFATE 10 MG/ML IV SOLN
INTRAVENOUS | Status: DC | PRN
  Administered 2016-02-09: 160 mg via INTRAVENOUS

## 2016-02-09 MED ORDER — SODIUM CHLORIDE 0.9 % IV SOLN
INTRAVENOUS | Status: DC
  Filled 2016-02-09: qty 40

## 2016-02-09 MED ORDER — SODIUM CHLORIDE 0.9 % IV SOLN
INTRAVENOUS | Status: DC | PRN
  Administered 2016-02-09 (×3): via INTRAVENOUS

## 2016-02-09 MED ORDER — BISACODYL 5 MG PO TBEC: 10.0000 mg | DELAYED_RELEASE_TABLET | Freq: Every day | ORAL | Status: DC

## 2016-02-09 MED ORDER — PHENYLEPHRINE HCL 10 MG/ML IJ SOLN
INTRAMUSCULAR | Status: DC | PRN
  Administered 2016-02-09 (×7): 40 ug via INTRAVENOUS

## 2016-02-09 MED ORDER — LACTATED RINGERS IV SOLN
INTRAVENOUS | Status: DC | PRN
  Administered 2016-02-09: 07:00:00 via INTRAVENOUS

## 2016-02-09 MED ORDER — OXYCODONE HCL 5 MG PO TABS: 5.0000 mg | ORAL_TABLET | ORAL | Status: DC | PRN

## 2016-02-09 MED ORDER — ARTIFICIAL TEARS OP OINT
TOPICAL_OINTMENT | OPHTHALMIC | Status: DC | PRN
  Administered 2016-02-09: 1 via OPHTHALMIC

## 2016-02-09 MED ORDER — ARTIFICIAL TEARS OP OINT
TOPICAL_OINTMENT | OPHTHALMIC | Status: AC
  Filled 2016-02-09: qty 3.5

## 2016-02-09 MED ORDER — SODIUM CHLORIDE 0.9 % IJ SOLN
INTRAMUSCULAR | Status: AC
  Filled 2016-02-09: qty 10

## 2016-02-09 MED ORDER — VANCOMYCIN HCL 1000 MG IV SOLR
1000.0000 mg | INTRAVENOUS | Status: DC | PRN
  Administered 2016-02-09: 1000 mg via INTRAVENOUS

## 2016-02-09 MED ORDER — DEXTROSE 50 % IV SOLN
18.0000 mL | Freq: Once | INTRAVENOUS | Status: AC
  Administered 2016-02-09: 18 mL via INTRAVENOUS

## 2016-02-09 MED ORDER — DEXTROSE 50 % IV SOLN
13.0000 mL | Freq: Once | INTRAVENOUS | Status: AC
  Administered 2016-02-09: 13 mL via INTRAVENOUS

## 2016-02-09 MED ORDER — MAGNESIUM SULFATE 4 GM/100ML IV SOLN
4.0000 g | Freq: Once | INTRAVENOUS | Status: AC
  Administered 2016-02-09: 4 g via INTRAVENOUS
  Filled 2016-02-09: qty 100

## 2016-02-09 MED ORDER — PROPOFOL 10 MG/ML IV BOLUS
INTRAVENOUS | Status: AC
  Filled 2016-02-09: qty 20

## 2016-02-09 MED ORDER — METOPROLOL TARTRATE 25 MG/10 ML ORAL SUSPENSION: 12.5000 mg | Freq: Two times a day (BID) | ORAL | Status: DC

## 2016-02-09 MED ORDER — HEPARIN SODIUM (PORCINE) 1000 UNIT/ML IJ SOLN
INTRAMUSCULAR | Status: AC
  Filled 2016-02-09: qty 1

## 2016-02-09 MED ORDER — ASPIRIN EC 325 MG PO TBEC
325.0000 mg | DELAYED_RELEASE_TABLET | Freq: Every day | ORAL | Status: DC
Start: 2016-02-10 — End: 2016-02-11

## 2016-02-09 MED ORDER — MORPHINE SULFATE (PF) 2 MG/ML IV SOLN: 1.0000 mg | INTRAVENOUS | Status: DC | PRN

## 2016-02-09 MED ORDER — CHLORHEXIDINE GLUCONATE 0.12 % MT SOLN
15.0000 mL | OROMUCOSAL | Status: AC
  Administered 2016-02-09: 15 mL via OROMUCOSAL

## 2016-02-09 MED ORDER — PHENYLEPHRINE 40 MCG/ML (10ML) SYRINGE FOR IV PUSH (FOR BLOOD PRESSURE SUPPORT)
PREFILLED_SYRINGE | INTRAVENOUS | Status: AC
  Filled 2016-02-09: qty 10

## 2016-02-09 MED ORDER — ROCURONIUM BROMIDE 50 MG/5ML IV SOLN
INTRAVENOUS | Status: AC
  Filled 2016-02-09: qty 2

## 2016-02-09 MED ORDER — INSULIN ASPART 100 UNIT/ML ~~LOC~~ SOLN
0.0000 [IU] | SUBCUTANEOUS | Status: DC
  Administered 2016-02-10 (×3): 2 [IU] via SUBCUTANEOUS
  Administered 2016-02-10: 8 [IU] via SUBCUTANEOUS
  Administered 2016-02-10 – 2016-02-11 (×3): 2 [IU] via SUBCUTANEOUS

## 2016-02-09 MED ORDER — CHLORHEXIDINE GLUCONATE 4 % EX LIQD: 30.0000 mL | CUTANEOUS | Status: DC

## 2016-02-09 MED ORDER — MIDAZOLAM HCL 2 MG/2ML IJ SOLN
2.0000 mg | INTRAMUSCULAR | Status: DC | PRN
Start: 2016-02-09 — End: 2016-02-10
  Administered 2016-02-09 – 2016-02-10 (×2): 1 mg via INTRAVENOUS
  Filled 2016-02-09: qty 2

## 2016-02-09 MED ORDER — LIDOCAINE 2% (20 MG/ML) 5 ML SYRINGE
INTRAMUSCULAR | Status: AC
  Filled 2016-02-09: qty 5

## 2016-02-09 MED ORDER — NOREPINEPHRINE BITARTRATE 1 MG/ML IV SOLN
0.0000 ug/min | INTRAVENOUS | Status: DC
  Filled 2016-02-09: qty 16

## 2016-02-09 MED ORDER — ROCURONIUM BROMIDE 100 MG/10ML IV SOLN
INTRAVENOUS | Status: DC | PRN
  Administered 2016-02-09: 20 mg via INTRAVENOUS
  Administered 2016-02-09: 100 mg via INTRAVENOUS

## 2016-02-09 MED ORDER — ALBUMIN HUMAN 5 % IV SOLN
250.0000 mL | INTRAVENOUS | Status: DC | PRN
  Administered 2016-02-09 – 2016-02-10 (×2): 250 mL via INTRAVENOUS

## 2016-02-09 MED ORDER — 0.9 % SODIUM CHLORIDE (POUR BTL) OPTIME
TOPICAL | Status: DC | PRN
  Administered 2016-02-09: 5000 mL

## 2016-02-09 MED ORDER — SODIUM CHLORIDE 0.45 % IV SOLN: INTRAVENOUS | Status: DC | PRN

## 2016-02-09 MED ORDER — FAMOTIDINE IN NACL 20-0.9 MG/50ML-% IV SOLN
20.0000 mg | Freq: Two times a day (BID) | INTRAVENOUS | Status: AC
  Administered 2016-02-09 (×2): 20 mg via INTRAVENOUS
  Filled 2016-02-09: qty 50

## 2016-02-09 MED ORDER — ONDANSETRON HCL 4 MG/2ML IJ SOLN
4.0000 mg | Freq: Four times a day (QID) | INTRAMUSCULAR | Status: DC | PRN
  Administered 2016-02-12: 4 mg via INTRAVENOUS
  Filled 2016-02-09: qty 2

## 2016-02-09 MED ORDER — LACTATED RINGERS IV SOLN
INTRAVENOUS | Status: DC
  Administered 2016-02-09: 15:00:00 via INTRAVENOUS

## 2016-02-09 MED ORDER — ACETAMINOPHEN 160 MG/5ML PO SOLN: 650.0000 mg | Freq: Once | ORAL | Status: AC

## 2016-02-09 MED ORDER — LACTATED RINGERS IV SOLN: 500.0000 mL | Freq: Once | INTRAVENOUS | Status: DC | PRN

## 2016-02-09 MED ORDER — SODIUM CHLORIDE 0.9 % IV SOLN
INTRAVENOUS | Status: DC | PRN
  Administered 2016-02-09: 1000 mL

## 2016-02-09 MED ORDER — MORPHINE SULFATE (PF) 2 MG/ML IV SOLN
2.0000 mg | INTRAVENOUS | Status: DC | PRN
  Administered 2016-02-09 – 2016-02-10 (×4): 2 mg via INTRAVENOUS
  Administered 2016-02-10: 4 mg via INTRAVENOUS
  Filled 2016-02-09 (×3): qty 2

## 2016-02-09 MED ORDER — SODIUM CHLORIDE 0.9 % IV SOLN
250.0000 [IU] | INTRAVENOUS | Status: DC | PRN
  Administered 2016-02-09: 1.3 [IU]/h via INTRAVENOUS

## 2016-02-09 MED ORDER — ALBUMIN HUMAN 5 % IV SOLN
INTRAVENOUS | Status: DC | PRN
  Administered 2016-02-09 (×2): via INTRAVENOUS

## 2016-02-09 MED ORDER — ROCURONIUM BROMIDE 50 MG/5ML IV SOLN
INTRAVENOUS | Status: AC
  Filled 2016-02-09: qty 3

## 2016-02-09 MED ORDER — CHLORHEXIDINE GLUCONATE 0.12% ORAL RINSE (MEDLINE KIT)
15.0000 mL | Freq: Two times a day (BID) | OROMUCOSAL | Status: DC
  Administered 2016-02-09 – 2016-02-13 (×6): 15 mL via OROMUCOSAL

## 2016-02-09 MED ORDER — ANTISEPTIC ORAL RINSE SOLUTION (CORINZ)
7.0000 mL | Freq: Four times a day (QID) | OROMUCOSAL | Status: DC
  Administered 2016-02-10 – 2016-02-17 (×6): 7 mL via OROMUCOSAL

## 2016-02-09 MED ORDER — HEPARIN SODIUM (PORCINE) 1000 UNIT/ML IJ SOLN
INTRAMUSCULAR | Status: DC | PRN
  Administered 2016-02-09: 16000 [IU] via INTRAVENOUS

## 2016-02-09 MED ORDER — MILRINONE LACTATE IN DEXTROSE 20-5 MG/100ML-% IV SOLN
0.1250 ug/kg/min | INTRAVENOUS | Status: DC
  Administered 2016-02-09: .3 ug/kg/min via INTRAVENOUS
  Filled 2016-02-09: qty 100

## 2016-02-09 MED ORDER — SODIUM CHLORIDE 0.9% FLUSH: 3.0000 mL | INTRAVENOUS | Status: DC | PRN

## 2016-02-09 MED ORDER — BISACODYL 10 MG RE SUPP
10.0000 mg | Freq: Every day | RECTAL | Status: DC
  Administered 2016-02-10: 10 mg via RECTAL
  Filled 2016-02-09: qty 1

## 2016-02-09 MED ORDER — DEXMEDETOMIDINE HCL IN NACL 400 MCG/100ML IV SOLN
INTRAVENOUS | Status: DC | PRN
  Administered 2016-02-09: .3 ug/kg/h via INTRAVENOUS

## 2016-02-09 MED ORDER — ACETAMINOPHEN 650 MG RE SUPP
650.0000 mg | Freq: Once | RECTAL | Status: AC
  Administered 2016-02-09: 650 mg via RECTAL

## 2016-02-09 MED ORDER — FENTANYL CITRATE (PF) 250 MCG/5ML IJ SOLN
INTRAMUSCULAR | Status: AC
  Filled 2016-02-09: qty 15

## 2016-02-09 MED ORDER — EPHEDRINE SULFATE 50 MG/ML IJ SOLN
INTRAMUSCULAR | Status: DC | PRN
  Administered 2016-02-09 (×2): 5 mg via INTRAVENOUS

## 2016-02-09 MED ORDER — ACETAMINOPHEN 500 MG PO TABS: 1000.0000 mg | ORAL_TABLET | Freq: Four times a day (QID) | ORAL | Status: DC

## 2016-02-09 MED ORDER — GELATIN ABSORBABLE MT POWD
OROMUCOSAL | Status: DC | PRN
  Administered 2016-02-09 (×3): 4 mL via TOPICAL

## 2016-02-09 MED ORDER — VANCOMYCIN HCL IN DEXTROSE 1-5 GM/200ML-% IV SOLN
1000.0000 mg | Freq: Once | INTRAVENOUS | Status: AC
  Administered 2016-02-09: 1000 mg via INTRAVENOUS
  Filled 2016-02-09: qty 200

## 2016-02-09 MED ORDER — ACETAMINOPHEN 160 MG/5ML PO SOLN
1000.0000 mg | Freq: Four times a day (QID) | ORAL | Status: DC
  Administered 2016-02-10 (×2): 1000 mg
  Filled 2016-02-09: qty 40.6

## 2016-02-09 MED ORDER — LEVOFLOXACIN IN D5W 750 MG/150ML IV SOLN
750.0000 mg | INTRAVENOUS | Status: AC
  Administered 2016-02-10: 750 mg via INTRAVENOUS
  Filled 2016-02-09: qty 150

## 2016-02-09 MED ORDER — DEXMEDETOMIDINE HCL IN NACL 200 MCG/50ML IV SOLN
0.0000 ug/kg/h | INTRAVENOUS | Status: DC
  Filled 2016-02-09 (×2): qty 50

## 2016-02-09 MED ORDER — MILRINONE LACTATE IN DEXTROSE 20-5 MG/100ML-% IV SOLN: 0.3000 ug/kg/min | INTRAVENOUS | Status: DC

## 2016-02-09 MED ORDER — CALCIUM CHLORIDE 10 % IV SOLN
INTRAVENOUS | Status: DC | PRN
  Administered 2016-02-09 (×2): 250 mg via INTRAVENOUS

## 2016-02-09 MED FILL — Sodium Chloride IV Soln 0.9%: INTRAVENOUS | Qty: 2000 | Status: AC

## 2016-02-09 MED FILL — Heparin Sodium (Porcine) Inj 1000 Unit/ML: INTRAMUSCULAR | Qty: 30 | Status: AC

## 2016-02-09 MED FILL — Potassium Chloride Inj 2 mEq/ML: INTRAVENOUS | Qty: 40 | Status: AC

## 2016-02-09 MED FILL — Magnesium Sulfate Inj 50%: INTRAMUSCULAR | Qty: 10 | Status: AC

## 2016-02-09 MED FILL — Lidocaine HCl IV Inj 20 MG/ML: INTRAVENOUS | Qty: 5 | Status: AC

## 2016-02-09 MED FILL — Sodium Bicarbonate IV Soln 8.4%: INTRAVENOUS | Qty: 100 | Status: AC

## 2016-02-09 MED FILL — Mannitol IV Soln 20%: INTRAVENOUS | Qty: 500 | Status: AC

## 2016-02-09 MED FILL — Electrolyte-R (PH 7.4) Solution: INTRAVENOUS | Qty: 4000 | Status: AC

## 2016-02-09 SURGICAL SUPPLY — 125 items
ADAPTER CARDIO PERF ANTE/RETRO (ADAPTER) ×6 IMPLANT
ADPR PRFSN 84XANTGRD RTRGD (ADAPTER) ×4
APL SKNCLS STERI-STRIP NONHPOA (GAUZE/BANDAGES/DRESSINGS) ×2
ATTRACTOMAT 16X20 MAGNETIC DRP (DRAPES) ×3 IMPLANT
BAG DECANTER FOR FLEXI CONT (MISCELLANEOUS) ×3 IMPLANT
BENZOIN TINCTURE PRP APPL 2/3 (GAUZE/BANDAGES/DRESSINGS) ×1 IMPLANT
BLADE STERNUM SYSTEM 6 (BLADE) ×6 IMPLANT
BLADE SURG 11 STRL SS (BLADE) ×6 IMPLANT
BOOT SUTURE AID YELLOW STND (SUTURE) ×1 IMPLANT
CANISTER SUCTION 2500CC (MISCELLANEOUS) ×6 IMPLANT
CANN PRFSN 3/8X14X24FR PCFC (MISCELLANEOUS)
CANNULA EZ GLIDE AORTIC 21FR (CANNULA) ×3 IMPLANT
CANNULA FEM VENOUS REMOTE 22FR (CANNULA) ×1 IMPLANT
CANNULA GUNDRY RCSP 15FR (MISCELLANEOUS) ×6 IMPLANT
CANNULA PRFSN 3/8X14X24FR PCFC (MISCELLANEOUS) IMPLANT
CANNULA SOFTFLOW AORTIC 7M21FR (CANNULA) ×3 IMPLANT
CANNULA SUMP PERICARDIAL (CANNULA) ×2 IMPLANT
CANNULA VEN MTL TIP RT (MISCELLANEOUS)
CANNULA VENNOUS METAL TIP 20FR (CANNULA) ×1 IMPLANT
CATH CPB KIT OWEN (MISCELLANEOUS) ×3 IMPLANT
CATH EMB 6FR 80CM (CATHETERS) ×1 IMPLANT
CATH HEART VENT LEFT (CATHETERS) ×2 IMPLANT
CATH THORACIC 28FR RT ANG (CATHETERS) IMPLANT
CATH THORACIC 36FR (CATHETERS) IMPLANT
CATH THORACIC 36FR RT ANG (CATHETERS) ×3 IMPLANT
CLAMP ISOLATOR SYNERGY LG (MISCELLANEOUS) ×3 IMPLANT
CLIP FOGARTY SPRING 6M (CLIP) IMPLANT
CONN 1/2X1/2X1/2  BEN (MISCELLANEOUS) ×1
CONN 1/2X1/2X1/2 BEN (MISCELLANEOUS) ×2 IMPLANT
CONN 3/8X1/2 ST GISH (MISCELLANEOUS) ×7 IMPLANT
CONN ST 1/4X3/8  BEN (MISCELLANEOUS) ×2
CONN ST 1/4X3/8 BEN (MISCELLANEOUS) IMPLANT
CONT SPECI 4OZ STER CLIK (MISCELLANEOUS) ×2 IMPLANT
COVER SURGICAL LIGHT HANDLE (MISCELLANEOUS) ×6 IMPLANT
CRADLE DONUT ADULT HEAD (MISCELLANEOUS) ×6 IMPLANT
DEVICE SUT CK QUICK LOAD INDV (Prosthesis & Implant Heart) ×2 IMPLANT
DEVICE SUT CK QUICK LOAD MINI (Prosthesis & Implant Heart) ×1 IMPLANT
DRAIN CHANNEL 32F RND 10.7 FF (WOUND CARE) ×6 IMPLANT
DRAPE BILATERAL SPLIT (DRAPES) IMPLANT
DRAPE CARDIOVASCULAR INCISE (DRAPES)
DRAPE CV SPLIT W-CLR ANES SCRN (DRAPES) IMPLANT
DRAPE INCISE IOBAN 66X45 STRL (DRAPES) ×6 IMPLANT
DRAPE SLUSH/WARMER DISC (DRAPES) ×6 IMPLANT
DRAPE SRG 135X102X78XABS (DRAPES) IMPLANT
DRSG AQUACEL AG ADV 3.5X14 (GAUZE/BANDAGES/DRESSINGS) ×1 IMPLANT
DRSG COVADERM 4X14 (GAUZE/BANDAGES/DRESSINGS) ×6 IMPLANT
ELECT REM PT RETURN 9FT ADLT (ELECTROSURGICAL) ×12
ELECTRODE REM PT RTRN 9FT ADLT (ELECTROSURGICAL) ×8 IMPLANT
FELT TEFLON 1X6 (MISCELLANEOUS) ×4 IMPLANT
GAUZE SPONGE 4X4 12PLY STRL (GAUZE/BANDAGES/DRESSINGS) ×12 IMPLANT
GLOVE BIO SURGEON STRL SZ 6 (GLOVE) ×2 IMPLANT
GLOVE BIO SURGEON STRL SZ 6.5 (GLOVE) ×8 IMPLANT
GLOVE BIOGEL PI IND STRL 6 (GLOVE) IMPLANT
GLOVE BIOGEL PI IND STRL 6.5 (GLOVE) IMPLANT
GLOVE BIOGEL PI IND STRL 9 (GLOVE) IMPLANT
GLOVE BIOGEL PI INDICATOR 6 (GLOVE) ×4
GLOVE BIOGEL PI INDICATOR 6.5 (GLOVE) ×4
GLOVE BIOGEL PI INDICATOR 9 (GLOVE) ×1
GLOVE ORTHO TXT STRL SZ7.5 (GLOVE) ×9 IMPLANT
GOWN STRL REUS W/ TWL LRG LVL3 (GOWN DISPOSABLE) ×16 IMPLANT
GOWN STRL REUS W/TWL LRG LVL3 (GOWN DISPOSABLE) ×30
HEMOSTAT POWDER SURGIFOAM 1G (HEMOSTASIS) ×18 IMPLANT
INSERT FOGARTY XLG (MISCELLANEOUS) ×6 IMPLANT
KIT BASIN OR (CUSTOM PROCEDURE TRAY) ×6 IMPLANT
KIT DILATOR VASC 18G NDL (KITS) ×1 IMPLANT
KIT DRAINAGE VACCUM ASSIST (KITS) ×1 IMPLANT
KIT ROOM TURNOVER OR (KITS) ×6 IMPLANT
KIT SUCTION CATH 14FR (SUCTIONS) ×19 IMPLANT
KIT SUT CK MINI COMBO 4X17 (Prosthesis & Implant Heart) ×1 IMPLANT
LEAD PACING MYOCARDI (MISCELLANEOUS) ×3 IMPLANT
LINE VENT (MISCELLANEOUS) ×1 IMPLANT
LOOP VESSEL SUPERMAXI WHITE (MISCELLANEOUS) ×3 IMPLANT
NEEDLE 22X1 1/2 (OR ONLY) (NEEDLE) ×2 IMPLANT
NS IRRIG 1000ML POUR BTL (IV SOLUTION) ×30 IMPLANT
PACK OPEN HEART (CUSTOM PROCEDURE TRAY) ×6 IMPLANT
PAD ARMBOARD 7.5X6 YLW CONV (MISCELLANEOUS) ×12 IMPLANT
PROBE CRYO2-ABLATION MALLABLE (MISCELLANEOUS) ×1 IMPLANT
SET CARDIOPLEGIA MPS 5001102 (MISCELLANEOUS) ×1 IMPLANT
SET IRRIG TUBING LAPAROSCOPIC (IRRIGATION / IRRIGATOR) ×6 IMPLANT
SPONGE GAUZE 4X4 12PLY STER LF (GAUZE/BANDAGES/DRESSINGS) ×1 IMPLANT
SPONGE LAP 4X18 X RAY DECT (DISPOSABLE) ×1 IMPLANT
STOPCOCK 4 WAY LG BORE MALE ST (IV SETS) ×1 IMPLANT
SUCKER INTRACARDIAC WEIGHTED (SUCKER) ×3 IMPLANT
SUT BONE WAX W31G (SUTURE) ×6 IMPLANT
SUT ETHIBON 2 0 V 52N 30 (SUTURE) ×6 IMPLANT
SUT ETHIBON EXCEL 2-0 V-5 (SUTURE) IMPLANT
SUT ETHIBOND 2 0 SH (SUTURE) ×6
SUT ETHIBOND 2 0 SH 36X2 (SUTURE) ×4 IMPLANT
SUT ETHIBOND 2 0 V4 (SUTURE) IMPLANT
SUT ETHIBOND 2 0V4 GREEN (SUTURE) IMPLANT
SUT ETHIBOND 4 0 RB 1 (SUTURE) IMPLANT
SUT ETHIBOND V-5 VALVE (SUTURE) IMPLANT
SUT ETHIBOND X763 2 0 SH 1 (SUTURE) ×17 IMPLANT
SUT MNCRL AB 3-0 PS2 18 (SUTURE) ×12 IMPLANT
SUT PDS AB 1 CTX 36 (SUTURE) ×12 IMPLANT
SUT PROLENE 3 0 SH 1 (SUTURE) ×4 IMPLANT
SUT PROLENE 3 0 SH1 36 (SUTURE) ×9 IMPLANT
SUT PROLENE 4 0 RB 1 (SUTURE) ×27
SUT PROLENE 4 0 SH DA (SUTURE) ×10 IMPLANT
SUT PROLENE 4-0 RB1 .5 CRCL 36 (SUTURE) ×8 IMPLANT
SUT PROLENE 5 0 C 1 36 (SUTURE) ×1 IMPLANT
SUT PROLENE 6 0 C 1 30 (SUTURE) IMPLANT
SUT SILK  1 MH (SUTURE) ×3
SUT SILK 1 MH (SUTURE) ×4 IMPLANT
SUT SILK 3 0 SH CR/8 (SUTURE) IMPLANT
SUT STEEL 6MS V (SUTURE) IMPLANT
SUT STEEL STERNAL CCS#1 18IN (SUTURE) IMPLANT
SUT STEEL SZ 6 DBL 3X14 BALL (SUTURE) ×2 IMPLANT
SUT VIC AB 2-0 CTX 27 (SUTURE) IMPLANT
SUTURE E-PAK OPEN HEART (SUTURE) ×3 IMPLANT
SYR 3ML LL SCALE MARK (SYRINGE) ×4 IMPLANT
SYR TB 1ML LUER SLIP (SYRINGE) ×1 IMPLANT
SYS ARTICLIP LAA EXCLUSION 135 (Clip) ×1 IMPLANT
SYSTEM SAHARA CHEST DRAIN ATS (WOUND CARE) ×6 IMPLANT
TAPE CLOTH SURG 4X10 WHT LF (GAUZE/BANDAGES/DRESSINGS) ×1 IMPLANT
TAPE PAPER MEDFIX 1IN X 10YD (GAUZE/BANDAGES/DRESSINGS) ×1 IMPLANT
TOWEL OR 17X24 6PK STRL BLUE (TOWEL DISPOSABLE) ×12 IMPLANT
TOWEL OR 17X26 10 PK STRL BLUE (TOWEL DISPOSABLE) ×12 IMPLANT
TRAY FOLEY IC TEMP SENS 14FR (CATHETERS) ×3 IMPLANT
TRAY FOLEY IC TEMP SENS 16FR (CATHETERS) ×3 IMPLANT
UNDERPAD 30X30 INCONTINENT (UNDERPADS AND DIAPERS) ×6 IMPLANT
VALVE AORTIC TOP HAT (Prosthesis & Implant Heart) ×3 IMPLANT
VALVE AORTIC TOP HAT 23 (Prosthesis & Implant Heart) IMPLANT
VENT LEFT HEART 12002 (CATHETERS) ×3
WATER STERILE IRR 1000ML POUR (IV SOLUTION) ×12 IMPLANT

## 2016-02-09 NOTE — Progress Notes (Signed)
  Echocardiogram Echocardiogram Transesophageal has been performed.  Sheila Branch 02/09/2016, 9:10 AM

## 2016-02-09 NOTE — Progress Notes (Signed)
Cardiology Office Note   Date:  02/09/2016   ID:  Sheila Branch, DOB 06-May-1971, MRN [id number]  PCP:  Maren Reamer, MD  Cardiologist:  Dr. Meda Coffee   Chief complain: DOE   History of Present Illness: Sheila Branch is a 45 y.o. 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 She was admitted with pneumonia and was noted to be in atrial flutter with RVR. She was successfully cardioverted and continued on Xarelto. 2-D echo showed mild decrease LV function EF 50-55% with diffuse hypokinesis. There was mild concentric hypertrophy. 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 The left atrium was severely dilated. PA pressures were increased to 50 mmHg. The right atrium was mildly to moderately dilated.  She has been followed for acute on chronic systolic and diastolic CHF.  Evaluated by CT surgery and scheduled for AVR and subaortic membrane resection and maze procedure..        She underwent dental evaluation. Surgery scheduled for 6/1/207  Today she states that she has stable Dyspnea on mild exertion, no chest pain, no LE edema, no orthopnea, PND. No palpitations or syncope. She is complaining of blurry vision when she tries to read. She hasn't seen an eye doctor for years.    Past Medical History  Diagnosis Date  . Aortic insufficiency     severe  . Subvalvar aortic stenosis   . Chronic combined systolic and diastolic congestive heart failure ([place])   . Atrial fibrillation with rapid ventricular response ([place]) 01/08/2016  . Persistent atrial fibrillation ([place]) 11/03/2015  . CHD (congenital heart disease)   . COPD (chronic obstructive pulmonary disease) ([place])   . History of pneumonia   . Anxiety   . Dysrhythmia   . Heart murmur   . Pneumonia   . 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 S/P repair of patent ductus arteriosus 02/09/2016    Past Surgical History  Procedure Laterality Date  . Cardiac catheterization  childhood  . Cesarean section      pt. denies  . 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 Tubal ligation    . Cardioversion N/A 11/09/2015    Procedure: CARDIOVERSION;  Surgeon: Dorothy Spark, MD;  Location: [facility];  Service: Cardiovascular;  Laterality: N/A;  . Tee without cardioversion N/A 11/09/2015    Procedure: TRANSESOPHAGEAL ECHOCARDIOGRAM (TEE);  Surgeon: Dorothy Spark, MD;  Location: [facility] ENDOSCOPY;  Service: Cardiovascular;  Laterality: N/A;  . Cardiac catheterization N/A 01/11/2016    Procedure: Right/Left Heart Cath and Coronary Angiography;  Surgeon: Burnell Blanks, MD;  Location: [facility] CV LAB;  Service: Cardiovascular;  Laterality: N/A;  . Multiple extractions with alveoloplasty N/A 01/25/2016    Procedure: Extraction of tooth #'s 2,3,53-61,44,31 with alveoloplasty and gross debridement of remaining teeth.;  Surgeon: Lenn Cal, DDS;  Location: [facility];  Service: Oral Surgery;  Laterality: N/A;  . Mouth surgery Bilateral 01-25-16     No current facility-administered medications for this visit.   No current outpatient prescriptions on file.   Facility-Administered Medications Ordered in Other Visits  Medication Dose Route Frequency Provider  Last Rate Last Dose  . 0.45 % sodium chloride infusion   Intravenous Continuous PRN Rexene Alberts, MD 20 mL/hr at 02/09/16 2200    . 0.9 %  sodium chloride infusion   Intravenous Continuous Rexene Alberts, MD 100 mL/hr at 02/09/16 2200    . [START ON 02/10/2016] 0.9 %  sodium chloride infusion  250 mL Intravenous Continuous Rexene Alberts, MD      . 0.9 %  sodium chloride infusion   Intravenous Continuous Rexene Alberts, MD 10  mL/hr at 02/09/16 1900    . [START ON 02/10/2016] acetaminophen (TYLENOL) tablet 1,000 mg  1,000 mg Oral Q6H Rexene Alberts, MD       Or  . Derrill Memo ON 02/10/2016] acetaminophen (TYLENOL) solution 1,000 mg  1,000 mg Per Tube Q6H Rexene Alberts, MD      . albumin human 5 % solution 250 mL  250 mL Intravenous Q15 min PRN Rexene Alberts, MD   250 mL at 02/09/16 1502  . [START ON 02/10/2016] antiseptic oral rinse solution (CORINZ)  7 mL Mouth Rinse QID Rexene Alberts, MD      . Derrill Memo ON 02/10/2016] aspirin EC tablet 325 mg  325 mg Oral Daily Rexene Alberts, MD       Or  . Derrill Memo ON 02/10/2016] aspirin chewable tablet 324 mg  324 mg Per Tube Daily Rexene Alberts, MD      . Derrill Memo ON 02/10/2016] bisacodyl (DULCOLAX) EC tablet 10 mg  10 mg Oral Daily Rexene Alberts, MD       Or  . Derrill Memo ON 02/10/2016] bisacodyl (DULCOLAX) suppository 10 mg  10 mg Rectal Daily Rexene Alberts, MD      . chlorhexidine gluconate (SAGE KIT) (PERIDEX) 0.12 % solution 15 mL  15 mL Mouth Rinse BID Rexene Alberts, MD   15 mL at 02/09/16 1947  . dexmedetomidine (PRECEDEX) 200 MCG/50ML (4 mcg/mL) infusion  0-0.7 mcg/kg/hr Intravenous Continuous Rexene Alberts, MD   Stopped at 02/09/16 1640  . [START ON 02/10/2016] docusate sodium (COLACE) capsule 200 mg  200 mg Oral Daily Rexene Alberts, MD      . insulin aspart (novoLOG) injection 0-24 Units  0-24 Units Subcutaneous Q4H Rexene Alberts, MD   0 Units at 02/09/16 1615  . insulin regular (NOVOLIN R,HUMULIN R) 250 Units in sodium chloride 0.9 % 250 mL (1 Units/mL) infusion   Intravenous Continuous Rexene Alberts, MD   Stopped at 02/09/16 1600  . insulin regular bolus via infusion 0-10 Units  0-10 Units Intravenous TID WC Rexene Alberts, MD   0 Units at 02/09/16 1700  . lactated ringers infusion 500 mL  500 mL Intravenous Once PRN Rexene Alberts, MD      . lactated ringers infusion   Intravenous Continuous Rexene Alberts, MD   Stopped at 02/09/16 1500  . lactated ringers infusion    Intravenous Continuous Rexene Alberts, MD 20 mL/hr at 02/09/16 2200    . [START ON 02/10/2016] levofloxacin (LEVAQUIN) IVPB 750 mg  750 mg Intravenous Q24H Rexene Alberts, MD      . metoprolol (LOPRESSOR) injection 2.5-5 mg  2.5-5 mg Intravenous Q2H PRN Rexene Alberts, MD      . metoprolol tartrate (LOPRESSOR) tablet 12.5 mg  12.5 mg Oral BID Rexene Alberts, MD       Or  . metoprolol tartrate (LOPRESSOR) 25 mg/10 mL oral suspension 12.5 mg  12.5 mg Per Tube BID Rexene Alberts, MD   12.5 mg at 02/09/16 2045  . midazolam (VERSED) injection 2 mg  2 mg Intravenous Q1H PRN Rexene Alberts, MD      . milrinone (PRIMACOR) 20 MG/100 ML (0.2 mg/mL) infusion  0.3 mcg/kg/min Intravenous Continuous Rexene Alberts, MD 3.7 mL/hr at 02/09/16 2200 0.3 mcg/kg/min at 02/09/16 2200  . morphine 2 MG/ML injection 1-4 mg  1-4 mg Intravenous Q1H PRN Rexene Alberts, MD      . morphine 2 MG/ML injection 2-5 mg  2-5 mg Intravenous Q1H PRN Rexene Alberts, MD   2 mg at 02/09/16 2146  . nitroGLYCERIN 50 mg in dextrose 5 % 250 mL (0.2 mg/mL) infusion  0-100 mcg/min Intravenous Titrated Rexene Alberts, MD   Stopped at 02/09/16 1445  . ondansetron (ZOFRAN) injection 4 mg  4 mg Intravenous Q6H PRN Rexene Alberts, MD      . oxyCODONE (Oxy IR/ROXICODONE) immediate release tablet 5-10 mg  5-10 mg Oral Q3H PRN Rexene Alberts, MD      . Derrill Memo ON 02/11/2016] pantoprazole (PROTONIX) EC tablet 40 mg  40 mg Oral Daily Rexene Alberts, MD      . phenylephrine (NEO-SYNEPHRINE) 20 mg in dextrose 5 % 250 mL (0.08 mg/mL) infusion  0-100 mcg/min Intravenous Titrated Rexene Alberts, MD 22.5 mL/hr at 02/09/16 2200 30 mcg/min at 02/09/16 2200  . [START ON 02/10/2016] sodium chloride flush (NS) 0.9 % injection 3 mL  3 mL Intravenous Q12H Rexene Alberts, MD      . Derrill Memo ON 02/10/2016] sodium chloride flush (NS) 0.9 % injection 3 mL  3 mL Intravenous PRN Rexene Alberts, MD      . traMADol Veatrice Bourbon) tablet 50-100 mg  50-100 mg Oral Q4H PRN  Rexene Alberts, MD      . vancomycin (VANCOCIN) IVPB 1000 mg/200 mL premix  1,000 mg Intravenous Once Rexene Alberts, MD   1,000 mg at 02/09/16 2304    Allergies:   Penicillins cross reactors    Social History:  The patient  reports that she quit smoking about 4 months ago. Her smoking use included Cigarettes. She has a 23 pack-year smoking history. She has never used smokeless tobacco. She reports that she drinks alcohol. She reports that she uses illicit drugs (Marijuana).   Family History:  The patient's family history includes Heart disease in her mother.    ROS:  General:no colds or fevers, no weight changes Skin:no rashes or ulcers HEENT:no blurred vision, no congestion CV:see HPI PUL:see HPI GI:no diarrhea constipation or melena, no indigestion GU:no hematuria, no dysuria MS:no joint pain, no claudication Neuro:no syncope, no lightheadedness Endo:no diabetes, no thyroid disease  Wt Readings from Last 3 Encounters:  02/09/16 90 lb (40.824 kg)  02/08/16 90 lb 12.8 oz (41.187 kg)  02/08/16 91 lb 4.8 oz (41.413 kg)     PHYSICAL EXAM: VS:  BP 130/102 mmHg  Pulse 64  Ht 5' 3.5" (1.613 m)  Wt 90 lb 12.8 oz (41.187 kg)  BMI 15.83 kg/m2  LMP 12/12/2015 (Approximate) , BMI Body mass index is 15.83 kg/(m^2). General:Pleasant affect, NAD, appears cachectic Skin:Warm and dry, brisk capillary refill HEENT:normocephalic, sclera clear, mucus membranes moist Neck:supple, no JVD, no bruits  Heart:S1S2 RRR with 6/6 hosystolic and diastolic murmur, no gallup, rub or click Lungs: without rales,  +rhonchi, no wheezes DJM:EQAS, non tender, + BS, do not palpate liver spleen or masses Ext:no lower  ext edema except at feet, 2+ pedal pulses, 2+ radial pulses Neuro:alert and oriented X 3, MAE, follows commands, + facial symmetry  EKG:  EKG is ordered today. The ekg ordered today demonstrates SR at 35 with PACs freq and LVH.  No acute changes.   Recent Labs: 11/03/2015: TSH  0.984 01/12/2016: B Natriuretic Peptide 1639.9* 02/08/2016: ALT 18 02/09/2016: BUN 16; Creatinine, Ser 1.00; Hemoglobin 10.2*; Magnesium 3.9*; Platelets 94*; Potassium 4.5; Sodium 140    Lipid Panel No results found for: CHOL, TRIG, HDL, CHOLHDL, VLDL, LDLCALC, LDLDIRECT   Other studies Reviewed: Additional studies/ records that were reviewed today include: reviewed TEE and Echo..   ASSESSMENT AND PLAN:  1.  DOE - sec to AS due to subvalvular membrane and AI - scheduled for AVR and subvalvular membrane resection on 6/1  2. Paroxysmal atrial fibrillation - resolved maintaining SR, scheduled for maze   3. Chronic combined systolic and diastolic CHF with valvular disease - appears euvolemic  4. Moderate pulmonary hypertension   5. Blurry vision - advised to follow with an ophthalmologist after the surgery.   Follow up after the discharge.  Signed, Ena Dawley, MD  02/09/2016 11:09 PM    [address], [address] [place], [place] Phone: [phone]; Fax: [phone]

## 2016-02-09 NOTE — Interval H&P Note (Signed)
History and Physical Interval Note:  02/09/2016 6:20 AM  Sheila Branch  has presented today for surgery, with the diagnosis of AS AI AFIB  The various methods of treatment have been discussed with the patient and family. After consideration of risks, benefits and other options for treatment, the patient has consented to  Procedure(s): AORTIC VALVE REPLACEMENT AND RESECTION OF SUBVALVAR AORTIC STENOSIS (N/A) MAZE (N/A) TRANSESOPHAGEAL ECHOCARDIOGRAM (TEE) (N/A) as a surgical intervention .  The patient's history has been reviewed, patient examined, no change in status, stable for surgery.  I have reviewed the patient's chart and labs.  Questions were answered to the patient's satisfaction.     Purcell Nails

## 2016-02-09 NOTE — Brief Op Note (Signed)
02/09/2016  12:55 PM  PATIENT:  Sheila Branch  45 y.o. female  PRE-OPERATIVE DIAGNOSIS:   Sub-aortic stenosis AI AFIB  POST-OPERATIVE DIAGNOSIS:   Sub-aortic stenosis AI AFIB Patent ductus arteriosus  PROCEDURE:  Procedure(s):  AORTIC VALVE REPLACEMENT -23 mm Sorin Carbomedics Supra annular Top Hat Mechanical Valve   RESECTION OF SUBVALVAR AORTIC STENOSIS (N/A)  LIGATION OF PATENT DUCTUS ARTERIOSUS  MAZE (N/A) -Clipping of Left Atrial Appendage -Ablation/Cryotherapy of Left Atrial Lesion Set  TRANSESOPHAGEAL ECHOCARDIOGRAM (TEE) (N/A)  SURGEON:    Purcell Nails, MD  ASSISTANTS:  Ronn Melena, CRNFA and Lowella Dandy, PA-C  ANESTHESIA:   Karlyne Greenspan, MD  CROSSCLAMP TIME:   139'  CARDIOPULMONARY BYPASS TIME: 220'  FINDINGS:  Mild LV systolic dysfunction  Severe pulmonary hypertension  Severe aortic insufficiency  Sub-valvar aortic stenosis due to  Sub-valvar membrane  Patent ductus arteriosus with left to right shunt   Aortic Valve Etiology   Aortic Insufficiency:  Severe  Aortic Valve Disease:  Yes.  Aortic Stenosis:  Yes. Smallest Aortic Valve Area: 1.0cm2; Highest Mean Gradient: .  Etiology (Choose at least one and up to  5 etiologies):  Congenital (other than bicuspid) and LV outflow tract pathology, Sub-aortic membrane  Aortic Valve  Procedure Performed:  Replacement: Yes.  Mechanical Valve. Implant Model Number:S5-023, Size:23, Unique Device Identifier:[id number]  Repair/Reconstruction: No.   Aortic Annular Enlargement: No.    Maze Procedure  Surgical Approach: Median sternotomy  Cut-and-sew:  No.  Cryo: Yes  Cryo Lesions (select all that apply):       6  Mitral Valve Cryo Lesion,       Radiofrequency:  Yes.  Bipolar: Yes.  RF Lesions (select all that apply):      1   Pulmonary Vein Isolation,    2   Box Lesion,   3a  Inferior Pulmonary Vein Connecting Lesion,   3b  Superior Pulmonary Vein Connecting Lesion,     4   Posterior Mirtal Annular Line,      Left Atrial Appendage Treatment:    Yes -  epicardial clip    COMPLICATIONS: None  BASELINE WEIGHT: 40.8 kg  PATIENT DISPOSITION:   TO SICU IN STABLE CONDITION  Purcell Nails, MD 02/09/2016 2:28 PM

## 2016-02-09 NOTE — Op Note (Signed)
CARDIOTHORACIC SURGERY OPERATIVE NOTE  Date of Procedure:  02/09/2016  Preoperative Diagnosis:   Sub-valvular Aortic Stenosis  Severe Aortic Insufficiency  Recurrent Persistent Atrial Fibrillation  Postoperative Diagnosis:   Sub-valvular Aortic Stenosis  Severe Aortic Insufficiency  Recurrent Persistent Atrial Fibrillation  Patent Ductus Arteriosus  Procedure:   Resection of Sub-Valvar Aortic Membrane   Aortic Valve Replacement   Sorin Carbomedics Top Hat Bileaflet Mechanical Valve (size 23mm, catalog [id number], serial [id number])  Maze Procedure   Left atrial lesion set using bipolar radiofrequency and cryothermy ablation  clipping of left atrial appendage (Atriclip size 35mm)  Ligation of Patent Ductus Arteriosus   Surgeon: Salvatore Decent. 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 The patient states that she has known that she has had a heart murmur all of her life and has been told that she had a "hole in her heart" She apparently underwent cardiac catheterization during her childhood and her mother was told that she needed cardiac surgery. According to the patient her mother insisted on postponing surgical intervention and treating her medically as long as possible. The patient moved to [place] within 20 years ago. She apparently underwent  echocardiography and was evaluated by cardiologist when she was pregnant with her second child approximately 18 years ago. She carried her child to term pregnancy and had an uncomplicated delivery. She never returned for medical follow-up.  The patient states that approximately 9 months ago she began to experience progressive symptoms of exertional shortness of breath Symptoms gradually got worse and became problematic with relatively mild physical activity and occasionally at rest. She developed symptoms of orthopnea, PND and lower extremity edema. He quit smoking approximately 4 months ago but shortness of breath continued to get worse. She was hospitalized acutely from 11/03/2015 through 11/08/2015 for community acquired pneumonia and rapid atrial fibrillation and atrial flutter. Transthoracic echocardiogram performed at that time revealed severe aortic insufficiency with subvalvar aortic stenosis. There was mild left ventricular systolic dysfunction with ejection fraction estimated 50-55%. There was trivial mitral regurgitation but severe left atrial enlargement. There was moderate right atrial enlargement and findings consistent with moderate pulmonary hypertension. The patient underwent TEE and was successfully cardioverted back to sinus rhythm. Transesophageal echocardiogram revealed diffuse "left ventricular systolic dysfunction with ejection fraction estimated 40% There was severe aortic insufficiency and subvalvar aortic membrane. She was discharged from the hospital and seen in follow-up by Nada Boozer on 12/22/2015.   The patient continued to experience symptoms of exertional shortness of breath, chest pain and a productive cough. She subsequently developed GI illness with diarrhea. She was readmitted to the hospital 01/06/2016 with recurrent persistent atrial fibrillation associated with rapid ventricular response. She spontaneously converted back to sinus rhythm on intravenous amiodarone.  Diagnostic cardiac catheterization revealed normal coronary artery anatomy with no significant coronary artery disease. She had severe pulmonary hypertension with PA pressures measured 90/44 andpulmonary capillary wedge pressure reported 17 mmHg. Hiram Comber  cardiac output was measured 7.02 L/m corresponding to cardiac index of 4.68. Formal oxygen saturation run was not performed to assess for possible shunt physiology. Cardiothoracic surgical consultation was requested.  Cardiac-gated CT angiogram of the heart was performed to rule out possible left to right shunt.  No sign of shunt was reported.  The patient has been seen in consultation and counseled at length regarding the indications, risks and potential benefits of surgery.  All questions have been answered, and the patient provides full informed consent for the operation as described.    DETAILS OF THE OPERATIVE PROCEDURE  Preparation:  The patient is brought to the operating room on the above mentioned date and central monitoring was established by the anesthesia team including placement of Swan-Ganz catheter and radial arterial line. There was severe pulmonary hypertension at baseline.  The patient is placed in the supine position on the operating table.  Intravenous antibiotics are administered. General endotracheal anesthesia is induced uneventfully. A Foley catheter is placed.  Baseline transesophageal echocardiogram was performed.  Findings were notable for severe aortic insufficiency. There was a discrete subvalvular aortic membrane with left ventricular outflow tract obstruction. Peak velocity across the left ventricular outflow tract measured 3.5 m/s. The left ventricle was dilated with end-diastolic diameter measured 6.5 cm.  There was mild left ventricular systolic dysfunctionwith ejection fraction estimated 50-55%.  Right ventricular size is enlarged but systolic function appeared normal.  The patient's chest, abdomen, both groins, and  both lower extremities are prepared and draped in a sterile manner. A time out procedure is performed.   Surgical Approach:  A median sternotomy incision was performed and the pericardium is opened. The ascending aorta is normal in appearance.    Extracorporeal Cardiopulmonary Bypass and Myocardial Protection:  The right common femoral vein is cannulated using the Seldinger technique and a guidewire advanced into the right atrium using TEE guidance.  The patient is heparinized systemically and the femoral vein cannulated using a 22 Fr long femoral venous cannula.  The ascending aorta is cannulated for cardiopulmonary bypass.  Adequate heparinization is verified.   A retrograde cardioplegia cannula is placed through the right atrium into the coronary sinus.  The entire pre-bypass portion of the operation was notable for stable hemodynamics.  Cardiopulmonary bypass was begun and the surface of the heart is inspected.  A second venous cannula is placed directly into the superior vena cava.   A cardioplegia cannula is placed in the ascending aorta.  A temperature probe was placed in the interventricular septum.  The patient is initially cooled to 32C systemic temperature.  The aortic cross clamp is applied and cold blood cardioplegia is delivered initially in an antegrade fashion through the aortic root.  Supplemental cardioplegia is given retrograde through the coronary sinus catheter.  Iced saline slush is applied for topical hypothermia.  The patient rapidly developed left ventricular distention.  A left atriotomy incision is performed posteriorly through the intra-atrial groove and a left ventricular vent inserted across the mitral valve under direct vision. There was torrential volume of pulmonary venous return, so much so that a single vent could not keep the left atrium and left ventricle decompressed.  A second vent is placed into the left atrium.  The possibility of undiagnosed congenital  heart defect causing left to right shunt is considered, such as patent ductus arteriosus.  Repeat doses of cardioplegia are administered intermittently throughout the entire cross clamp portion of the operation through the coronary sinus catheter in order to maintain  completely flat electrocardiogram and septal myocardial temperature below 15C.  Myocardial protection was felt to be sub-optimal due to the torrential amount of pulmonary venous return.  ssubsequently the patient is cooled to 28C systemic temperature.   Maze Procedure (left atrial lesion set):  The AtriCure Synergy bipolar radiofrequency ablation clamp is used for all radiofrequency ablation lesions for the maze procedure.  The Atricure CryoICE nitrous oxide cryothermy system is utilized for all cryothermy ablation lesions.   The heart is retracted towards the surgeon's side and the left sided pulmonary veins exposed.  An elliptical ablation lesion is created around the base of the left sided pulmonary veins.  A similar elliptical lesion was created around the base of the left atrial appendage.  The left atrial appendage was obliterated using an Atricure left atrial appendage clip (Atriclip, size 35mm).  The heart was replaced into the pericardial sac.  The left atriotomy incision was extended partially across the back wall of the left atrium after opening the oblique sinus inferiorly.  The floor of the left atrium and the mitral valve were exposed using a self-retaining retractor.    An ablation lesion was placed around the right sided pulmonary veins using the bipolar clamp with one limb of the clamp along the endocardial surface and one along the epicardial surface posteriorly.  A bipolar ablation lesion was placed across the dome of the left atrium from the cephalad apex of the atriotomy incision to reach the cephalad apex of the elliptical lesion around the left sided pulmonary veins.  A similar bipolar lesion was placed across the  back wall of the left atrium from the caudad apex of the atriotomy incision to reach the caudad apex of the elliptical lesion around the left sided pulmonary veins, thereby completing a box.  Finally another bipolar lesion was placed across the back wall of the left atrium from the caudad apex of the atriotomy incision towards the posterior mitral valve annulus.  This lesion was completed along the endocardial surface onto the posterior mitral annulus with a 3 minute duration cryothermy lesion.  This completes the entire left side lesion set of the Cox maze procedure.    The left atriotomy incision is closed using a 2 layer closure of running 3-0 Prolene suture, leaving both the left atrial and the left ventricular vent in place with Rummel tourniquets.   Resection of Sub-valvular Aortic Membrane:  An oblique transverse aortotomy incision was performed.  The aortic valve was inspected and notable for prolapse of the left coronary cusp.  The valve was obviously incompetent.  Upon inspection beneath the valve there was a discrete membrane in the left ventricular outflow tract.  The aortic valve leaflets were excised sharply.  Decalcification of the annulus was not required.  The sub-valvular membrane was excised using an 11 blade knife.  The resection was performed in a rectangular fashion, similar to that used for septal myomectomy.   Aortic Valve Replacement:  The aortic annulus was sized to accept a 23 mm prosthesis.  The aortic root and left ventricle were irrigated with copious cold saline solution.  Aortic valve replacement was performed using interrupted horizontal mattress 2-0 Ethibond pledgeted sutures with pledgets in the subannular position.  A Sorin Carbomedics [employer] valve (size 23 mm, catalog # [id number], serial # [id number]) was implanted uneventfully. The valve seated appropriately with adequate space beneath the left main and right coronary artery.  All sutures were  secured using a Cor-knot device.  The aortotomy  was closed using a 2 layer closure of running 4-0 Prolene suture.  One final dose of warm retrograde hotshot cardioplegia was administered while air was evacuated through the aortic root.  The aortic cross-clamp was removed after a total cross-clamp time of 139 minutes.   Ligation of Patent Ductus Arteriosus:  After the cross-clamp was removed and the heart beating, transesophageal echocardiogram was performed by Dr. Gentry Roch to investigate the possibility that the patient might have a patent ductus arteriosus. With careful examination of the main pulmonary trunk and the aortic arch one could clearly appreciate the presence of a left-to-right shunt across a patent ductus arteriosus.  The patient was placed in Trendelenburg's position. Arterial pump flow was temporarily turned down low and a transverse incision made across the anterior surface of the main pulmonary artery. One could immediately appreciate are large volume of arterial flow across the patent ductus arteriosus..  A 6 French Fogarty embolectomy catheter was passed through the patent ductus arteriosus and the balloon inflated to control flow across the ductus.  Arterial pump flow was increased back to normal.  The patent ductus arteriosus was ligated using several 3-0 Prolene pledgeted sutures placed circumferentially around the defect. The Fogarty balloon catheter was deflated and the repair inspected carefully for completeness.  Additional pledgeted sutures are placed circumferentially exterior to the pulmonary artery.  Once the defect was completely over-sewn, the incision in the anterior wall of the pulmonary artery was closed using a 2 layer closure of running 5-0 Prolene suture.    Procedure Completion:  Epicardial pacing wires are fixed to the right ventricular outflow tract and to the right atrial appendage. The patient is rewarmed to 37C temperature. The aortic and left ventricular  vents are removed.  The superior vena cava cannula was removed.  The patient is weaned and disconnected from cardiopulmonary bypass.  The patient's rhythm at separation from bypass was AV paced.  The patient was weaned from cardiopulmonary bypass on low dose milrinone. Total cardiopulmonary bypass time for the operation was 220 minutes.  Followup transesophageal echocardiogram performed after separation from bypass revealed  a well-seated bileaflet mechanical aortic valve prosthesis that was functioning normally and without any sign of perivalvular leak.  The 2 small characteristic washing jets of the mechanical valve were noted.  Left ventricular function was unchanged or perhaps slightly decreased from preoperatively.  There was no longer any sign of communication through the ductus arteriosus.    Simultaneous blood gas assessments were obtained from the patient's arterial circulation the right atrium, and the main pulmonary artery.  Mixed venous oxygen saturation was measured 77% while the main pulmonary artery oxygen saturation was 74%.  Arterial oxygen saturation was 100%.  The aortic cannula was removed uneventfully. Protamine was administered to reverse the anticoagulation. The femoral venous cannula was removed and manual pressure held on the groin for 30 minutes.  The mediastinum and pleural space were inspected for hemostasis and irrigated with saline solution. The mediastinum was drained using 2 chest tubes placed through separate stab incisions inferiorly.  The soft tissues anterior to the aorta were reapproximated loosely. The sternum is closed with double strength sternal wire. The soft tissues anterior to the sternum were closed in multiple layers and the skin is closed with a running subcuticular skin closure.   The post-bypass portion of the operation was notable for stable rhythm and hemodynamics.   No blood products were administered during the operation.   Patient Disposition:  The  patient tolerated the procedure well  and is transported to the surgical intensive care in stable condition. There are no intraoperative complications. All sponge instrument and needle counts are verified correct at completion of the operation.     Salvatore Decent. Cornelius Moras MD 02/09/2016 2:33 PM

## 2016-02-09 NOTE — Anesthesia Postprocedure Evaluation (Signed)
Anesthesia Post Note  Patient: Sheila Branch  Procedure(s) Performed: Procedure(s) (LRB): AORTIC VALVE REPLACEMENT USING A SIZE 23 CARBOMEDICS PROSTHESIS AND RESECTION OF SUBVALVAR AORTIC STENOSIS (N/A) MAZE (N/A) TRANSESOPHAGEAL ECHOCARDIOGRAM (TEE) (N/A) PATENT DUCTUS ARTERIOSUS (PDA) REPAIR (N/A)  Patient location during evaluation: SICU Anesthesia Type: General Level of consciousness: sedated Pain management: pain level controlled Vital Signs Assessment: post-procedure vital signs reviewed and stable Respiratory status: patient remains intubated per anesthesia plan Cardiovascular status: stable Anesthetic complications: no    Last Vitals:  Filed Vitals:   02/09/16 1515 02/09/16 1530  BP: 77/45 84/66  Pulse: 89 89  Temp: 37.3 C 37.2 C  Resp: 12 16    Last Pain: There were no vitals filed for this visit.               Reino Kent

## 2016-02-09 NOTE — Progress Notes (Signed)
Pt awoke for first time at approximately 2100, pt RR 33 thrashing around bed, unable to follow commands or be consoled by staff. Pt drifted back to sleep after approximately 10 minutes of staff attempting to calm and reorient patient unsuccessfully.  2130 pt again awoke thrashing upper body, head and legs around bed, kicking footboard, RR in 30s, still unable to follow any commands or be calmed by staff verbally. After 15 minutes of wild behavior I gave her 2mg  of Morphine and she drifted back off to sleep. Will continue to monitor with minimal sedation and await appropriate behavior in hopes of extubating safely.

## 2016-02-09 NOTE — Anesthesia Procedure Notes (Addendum)
Procedure Name: Intubation Date/Time: 02/09/2016 8:03 AM Performed by: Daiva Eves Pre-anesthesia Checklist: Patient identified, Timeout performed, Emergency Drugs available, Suction available and Patient being monitored Patient Re-evaluated:Patient Re-evaluated prior to inductionOxygen Delivery Method: Circle system utilized Preoxygenation: Pre-oxygenation with 100% oxygen Intubation Type: IV induction Ventilation: Mask ventilation without difficulty Laryngoscope Size: Mac and 3 Grade View: Grade I Tube type: Oral Tube size: 8.0 mm Number of attempts: 1 Airway Equipment and Method: Stylet Placement Confirmation: breath sounds checked- equal and bilateral,  ETT inserted through vocal cords under direct vision,  positive ETCO2 and CO2 detector Secured at: 23 cm Tube secured with: Tape Dental Injury: Teeth and Oropharynx as per pre-operative assessment  Comments: Performed by Arther Dames SRNA

## 2016-02-09 NOTE — Transfer of Care (Signed)
Immediate Anesthesia Transfer of Care Note  Patient: Sheila Branch  Procedure(s) Performed: Procedure(s): AORTIC VALVE REPLACEMENT USING A SIZE 23 CARBOMEDICS PROSTHESIS AND RESECTION OF SUBVALVAR AORTIC STENOSIS (N/A) MAZE (N/A) TRANSESOPHAGEAL ECHOCARDIOGRAM (TEE) (N/A) PATENT DUCTUS ARTERIOSUS (PDA) REPAIR (N/A)  Patient Location: SICU  Anesthesia Type:General  Level of Consciousness: sedated and Patient remains intubated per anesthesia plan  Airway & Oxygen Therapy: Patient remains intubated per anesthesia plan and Patient placed on Ventilator (see vital sign flow sheet for setting)  Post-op Assessment: Report given to RN and Post -op Vital signs reviewed and stable  Post vital signs: Reviewed and stable  Last Vitals:  Filed Vitals:   02/09/16 0548  BP: 156/47  Pulse: 63  Temp: 36.7 C  Resp: 18    Last Pain: There were no vitals filed for this visit.       Complications: No apparent anesthesia complications    Pt VSS during transport to ICU. Pt met my ICU RN and RT. Report given to RN/RT and all questions answered. Pt connected to ICU monitors and continued stability confirmed.

## 2016-02-09 NOTE — Progress Notes (Signed)
Patient ID: Sheila Branch, female   DOB: Feb 08, 1971, 45 y.o.   MRN: [id number]   SICU Evening Rounds:   Hemodynamically stable  CI = 2.3 on Milrinone 0.3 and neo 30  Still asleep on vent   Urine output good  CT output low  CBC    Component Value Date/Time   WBC 10.4 02/09/2016 1450   RBC 3.56* 02/09/2016 1450   HGB 9.5* 02/09/2016 1456   HCT 28.0* 02/09/2016 1456   PLT 83* 02/09/2016 1450   MCV 90.7 02/09/2016 1450   MCH 29.5 02/09/2016 1450   MCHC 32.5 02/09/2016 1450   RDW 15.4 02/09/2016 1450   LYMPHSABS 1.7 01/08/2016 0750   MONOABS 0.7 01/08/2016 0750   EOSABS 0.2 01/08/2016 0750   BASOSABS 0.0 01/08/2016 0750     BMET    Component Value Date/Time   NA 137 02/09/2016 1456   K 4.1 02/09/2016 1456   CL 100* 02/09/2016 1338   CO2 23 02/08/2016 0906   GLUCOSE 68 02/09/2016 1456   BUN 18 02/09/2016 1338   CREATININE 0.60 02/09/2016 1338   CREATININE 0.71 01/04/2016 1349   CALCIUM 9.3 02/08/2016 0906   GFRNONAA >60 02/08/2016 0906   GFRAA >60 02/08/2016 0906     A/P:  Stable postop course. Continue current plans

## 2016-02-09 NOTE — OR Nursing (Signed)
1st call placed @1333  to 2S Sheila Branch), providing a patient update.  Plans for transfer to Room 2 postoperatively. 2nd call placed @1354  to 2S Sheila Branch), providing a patient update. 3rd call placed @1412  to 2S 4th call placed @1438  to 2S

## 2016-02-09 NOTE — Anesthesia Preprocedure Evaluation (Signed)
Anesthesia Evaluation  Patient identified by MRN, date of birth, ID band Patient awake    Reviewed: Allergy & Precautions, NPO status , Patient's Chart, lab work & pertinent test results  Airway Mallampati: I  TM Distance: >3 FB Neck ROM: Full    Dental  (+) Poor Dentition, Dental Advisory Given   Pulmonary COPD, former smoker,    breath sounds clear to auscultation       Cardiovascular +CHF  + dysrhythmias Atrial Fibrillation + Valvular Problems/Murmurs AI and AS  Rhythm:Regular Rate:Normal + Systolic murmurs and + Diastolic murmurs EF on TEE is 53%, moderate AS, severe AI   Neuro/Psych Anxiety negative neurological ROS     GI/Hepatic negative GI ROS, Neg liver ROS,   Endo/Other  negative endocrine ROS  Renal/GU negative Renal ROS  negative genitourinary   Musculoskeletal negative musculoskeletal ROS (+)   Abdominal Normal abdominal exam  (+)   Peds negative pediatric ROS (+)  Hematology negative hematology ROS (+)   Anesthesia Other Findings   Reproductive/Obstetrics negative OB ROS                             Lab Results  Component Value Date   WBC 8.0 02/08/2016   HGB 12.7 02/08/2016   HCT 37.6 02/08/2016   MCV 88.7 02/08/2016   PLT 244 02/08/2016   Lab Results  Component Value Date   CREATININE 0.77 02/08/2016   BUN 16 02/08/2016   NA 136 02/08/2016   K 4.0 02/08/2016   CL 101 02/08/2016   CO2 23 02/08/2016   Lab Results  Component Value Date   INR 1.28 02/08/2016   INR 1.12 01/25/2016   INR 1.41 01/04/2016   Echo: - Left ventricle: Systolic function was mildly to moderately reduced. The estimated ejection fraction was in the range of 40% to 45%. Diffuse hypokinesis. - Aortic valve: There is thickening and calcification of the left coronary cusp. There is also a calcified subaortic membrane seen about 0.5 cm bellow the oartic valve in the LVOT. The  transaortic gradeints were not calculated on the current study. The leaflets don&'t coapt well and there is severe aortic regurgitation. - Left atrium: The atrium was severely dilated. No evidence of thrombus in the atrial cavity or appendage. No evidence of thrombus in the atrial cavity or appendage. No evidence of thrombus in the appendage. - Right atrium: No evidence of thrombus in the atrial cavity or appendage. - Tricuspid valve: There was mild-moderate regurgitation. - Pulmonic valve: No evidence of vegetation. There was moderate regurgitation.  EKG: Atrial Flutter   Anesthesia Physical  Anesthesia Plan  ASA: IV  Anesthesia Plan: General   Post-op Pain Management:    Induction: Intravenous  Airway Management Planned: Oral ETT  Additional Equipment: Arterial line, CVP, TEE, PA Cath and Ultrasound Guidance Line Placement  Intra-op Plan:   Post-operative Plan: Post-operative intubation/ventilation  Informed Consent: I have reviewed the patients History and Physical, chart, labs and discussed the procedure including the risks, benefits and alternatives for the proposed anesthesia with the patient or authorized representative who has indicated his/her understanding and acceptance.   Dental advisory given  Plan Discussed with: CRNA  Anesthesia Plan Comments:         Anesthesia Quick Evaluation

## 2016-02-10 ENCOUNTER — Inpatient Hospital Stay (HOSPITAL_COMMUNITY): Payer: Self-pay

## 2016-02-10 ENCOUNTER — Encounter (HOSPITAL_COMMUNITY): Payer: Self-pay | Admitting: Thoracic Surgery (Cardiothoracic Vascular Surgery)

## 2016-02-10 DIAGNOSIS — I359 Nonrheumatic aortic valve disorder, unspecified: Secondary | ICD-10-CM | POA: Insufficient documentation

## 2016-02-10 DIAGNOSIS — I4892 Unspecified atrial flutter: Secondary | ICD-10-CM | POA: Insufficient documentation

## 2016-02-10 LAB — CBC
HCT: 30.3 % — ABNORMAL LOW (ref 36.0–46.0)
HEMATOCRIT: 29.6 % — AB (ref 36.0–46.0)
Hemoglobin: 10 g/dL — ABNORMAL LOW (ref 12.0–15.0)
Hemoglobin: 9.5 g/dL — ABNORMAL LOW (ref 12.0–15.0)
MCH: 30.5 pg (ref 26.0–34.0)
MCH: 29.8 pg (ref 26.0–34.0)
MCHC: 33 g/dL (ref 30.0–36.0)
MCHC: 32.1 g/dL (ref 30.0–36.0)
MCV: 92.4 fL (ref 78.0–100.0)
MCV: 92.8 fL (ref 78.0–100.0)
PLATELETS: 91 10*3/uL — AB (ref 150–400)
PLATELETS: 96 10*3/uL — AB (ref 150–400)
RBC: 3.28 MIL/uL — ABNORMAL LOW (ref 3.87–5.11)
RBC: 3.19 MIL/uL — ABNORMAL LOW (ref 3.87–5.11)
RDW: 15.9 % — AB (ref 11.5–15.5)
RDW: 16 % — AB (ref 11.5–15.5)
WBC: 11.3 10*3/uL — ABNORMAL HIGH (ref 4.0–10.5)
WBC: 14.6 10*3/uL — AB (ref 4.0–10.5)

## 2016-02-10 LAB — BLOOD GAS, ARTERIAL
ACID-BASE DEFICIT: 5.7 mmol/L — AB (ref 0.0–2.0)
Bicarbonate: 18.3 mEq/L — ABNORMAL LOW (ref 20.0–24.0)
DRAWN BY: 252031
FIO2: 0.5
O2 SAT: 99.5 %
PATIENT TEMPERATURE: 98.6
PEEP/CPAP: 5 cmH2O
PH ART: 7.391 (ref 7.350–7.450)
PRESSURE SUPPORT: 10 cmH2O
RATE: 16 resp/min
TCO2: 19.2 mmol/L (ref 0–100)
VT: 550 mL
pCO2 arterial: 30.8 mmHg — ABNORMAL LOW (ref 35.0–45.0)
pO2, Arterial: 205 mmHg — ABNORMAL HIGH (ref 80.0–100.0)

## 2016-02-10 LAB — POCT I-STAT 3, ART BLOOD GAS (G3+)
ACID-BASE DEFICIT: 4 mmol/L — AB (ref 0.0–2.0)
ACID-BASE DEFICIT: 5 mmol/L — AB (ref 0.0–2.0)
ACID-BASE DEFICIT: 3 mmol/L — AB (ref 0.0–2.0)
BICARBONATE: 21.9 meq/L (ref 20.0–24.0)
BICARBONATE: 21.3 meq/L (ref 20.0–24.0)
BICARBONATE: 22.9 meq/L (ref 20.0–24.0)
O2 SAT: 99 %
O2 SAT: 98 %
O2 SAT: 98 %
PCO2 ART: 43.9 mmHg (ref 35.0–45.0)
PCO2 ART: 45.3 mmHg — AB (ref 35.0–45.0)
PCO2 ART: 45 mmHg (ref 35.0–45.0)
PH ART: 7.279 — AB (ref 7.350–7.450)
PH ART: 7.314 — AB (ref 7.350–7.450)
PO2 ART: 161 mmHg — AB (ref 80.0–100.0)
PO2 ART: 124 mmHg — AB (ref 80.0–100.0)
PO2 ART: 105 mmHg — AB (ref 80.0–100.0)
Patient temperature: 37.3
Patient temperature: 98.2
Patient temperature: 98.3
TCO2: 23 mmol/L (ref 0–100)
TCO2: 23 mmol/L (ref 0–100)
TCO2: 24 mmol/L (ref 0–100)
pH, Arterial: 7.306 — ABNORMAL LOW (ref 7.350–7.450)

## 2016-02-10 LAB — GLUCOSE, CAPILLARY
GLUCOSE-CAPILLARY: 138 mg/dL — AB (ref 65–99)
Glucose-Capillary: 121 mg/dL — ABNORMAL HIGH (ref 65–99)
Glucose-Capillary: 122 mg/dL — ABNORMAL HIGH (ref 65–99)
Glucose-Capillary: 130 mg/dL — ABNORMAL HIGH (ref 65–99)
Glucose-Capillary: 144 mg/dL — ABNORMAL HIGH (ref 65–99)
Glucose-Capillary: 203 mg/dL — ABNORMAL HIGH (ref 65–99)

## 2016-02-10 LAB — POCT I-STAT, CHEM 8
BUN: 14 mg/dL (ref 6–20)
CHLORIDE: 108 mmol/L (ref 101–111)
CREATININE: 0.9 mg/dL (ref 0.44–1.00)
Calcium, Ion: 1.18 mmol/L (ref 1.12–1.23)
GLUCOSE: 136 mg/dL — AB (ref 65–99)
HEMATOCRIT: 29 % — AB (ref 36.0–46.0)
Hemoglobin: 9.9 g/dL — ABNORMAL LOW (ref 12.0–15.0)
POTASSIUM: 4.4 mmol/L (ref 3.5–5.1)
Sodium: 144 mmol/L (ref 135–145)
TCO2: 22 mmol/L (ref 0–100)

## 2016-02-10 LAB — CREATININE, SERUM
Creatinine, Ser: 1.04 mg/dL — ABNORMAL HIGH (ref 0.44–1.00)
GFR calc non Af Amer: >60 mL/min (ref >60)

## 2016-02-10 LAB — BASIC METABOLIC PANEL
Anion gap: 8 (ref 5–15)
BUN: 14 mg/dL (ref 6–20)
CALCIUM: 7.9 mg/dL — AB (ref 8.9–10.3)
CO2: 19 mmol/L — AB (ref 22–32)
Chloride: 112 mmol/L — ABNORMAL HIGH (ref 101–111)
Creatinine, Ser: 1.24 mg/dL — ABNORMAL HIGH (ref 0.44–1.00)
GFR calc Af Amer: >60 mL/min (ref >60)
GFR, EST NON AFRICAN AMERICAN: 52 mL/min — AB (ref >60)
GLUCOSE: 135 mg/dL — AB (ref 65–99)
Potassium: 4 mmol/L (ref 3.5–5.1)
Sodium: 139 mmol/L (ref 135–145)

## 2016-02-10 LAB — MAGNESIUM
MAGNESIUM: 2.4 mg/dL (ref 1.7–2.4)
Magnesium: 3 mg/dL — ABNORMAL HIGH (ref 1.7–2.4)

## 2016-02-10 MED ORDER — MILRINONE LACTATE IN DEXTROSE 20-5 MG/100ML-% IV SOLN
0.1000 ug/kg/min | INTRAVENOUS | Status: DC
  Administered 2016-02-10: 0.2 ug/kg/min via INTRAVENOUS
  Filled 2016-02-10: qty 100

## 2016-02-10 MED ORDER — CHLORHEXIDINE GLUCONATE 0.12 % MT SOLN
OROMUCOSAL | Status: AC
Start: 2016-02-10 — End: 2016-02-10
  Administered 2016-02-10: 15 mL
  Filled 2016-02-10: qty 15

## 2016-02-10 MED ORDER — MORPHINE SULFATE (PF) 2 MG/ML IV SOLN
1.0000 mg | INTRAVENOUS | Status: DC | PRN
Start: 2016-02-10 — End: 2016-02-12
  Administered 2016-02-11 (×4): 1 mg via INTRAVENOUS
  Filled 2016-02-10 (×4): qty 1

## 2016-02-10 MED ORDER — DEXMEDETOMIDINE HCL IN NACL 200 MCG/50ML IV SOLN
0.4000 ug/kg/h | INTRAVENOUS | Status: DC
  Administered 2016-02-10: 1 ug/kg/h via INTRAVENOUS
  Administered 2016-02-10 – 2016-02-11 (×3): 1.2 ug/kg/h via INTRAVENOUS
  Filled 2016-02-10 (×4): qty 50

## 2016-02-10 MED ORDER — FUROSEMIDE 10 MG/ML IJ SOLN: 20.0000 mg | Freq: Once | INTRAMUSCULAR | Status: DC

## 2016-02-10 MED ORDER — FAMOTIDINE IN NACL 20-0.9 MG/50ML-% IV SOLN
20.0000 mg | Freq: Two times a day (BID) | INTRAVENOUS | Status: DC
  Administered 2016-02-10 (×2): 20 mg via INTRAVENOUS
  Filled 2016-02-10 (×2): qty 50

## 2016-02-10 MED ORDER — SODIUM BICARBONATE 8.4 % IV SOLN
50.0000 meq | Freq: Once | INTRAVENOUS | Status: AC
  Administered 2016-02-10: 50 meq via INTRAVENOUS

## 2016-02-10 NOTE — Progress Notes (Addendum)
[address]       [name] [address]             [phone]        CARDIOTHORACIC SURGERY PROGRESS NOTE   R1 Day Post-Op Procedure(s) (LRB): AORTIC VALVE REPLACEMENT USING A SIZE 23 CARBOMEDICS PROSTHESIS AND RESECTION OF SUBVALVAR AORTIC STENOSIS (N/A) MAZE (N/A) TRANSESOPHAGEAL ECHOCARDIOGRAM (TEE) (N/A) PATENT DUCTUS ARTERIOSUS (PDA) REPAIR (N/A)  Subjective: Awake and thrashing around on vent.  Moves all 4 extremities purposefully with good strength.  Pupils equal and reactive.  Not following commands.  Objective: Vital signs: BP Readings from Last 1 Encounters:  02/10/16 102/70   Pulse Readings from Last 1 Encounters:  02/10/16 80   Resp Readings from Last 1 Encounters:  02/10/16 16   Temp Readings from Last 1 Encounters:  02/10/16 98.8 F (37.1 C)     Hemodynamics: PAP: (21-47)/(11-25) 33/15 mmHg CO:  [2.7 L/min-4.5 L/min] 4.5 L/min CI:  [2 L/min/m2-3.3 L/min/m2] 3.3 L/min/m2  Physical Exam:  Rhythm:   Sinus brady - junctional  Breath sounds: clear  Heart sounds:  RRR  Incisions:  Dressing dry, intact  Abdomen:  Soft, non-distended, non-tender  Extremities:  Warm, well-perfused  Chest tubes:  Low volume thin serosanguinous output, no air leak    Intake/Output from previous day: 06/01 0701 - 06/02 0700 In: 7001.7 [I.V.:4849.2; Blood:378; NG/GT:60; IV Piggyback:1320] Out: 3260 [Urine:1880; Emesis/NG output:150; Blood:800; Chest Tube:430] Intake/Output this shift:    Lab Results:  CBC: Recent Labs  02/09/16 2100 02/09/16 2118 02/10/16 0400  WBC 10.5  --  11.3*  HGB 10.4* 10.2* 10.0*  HCT 32.4* 30.0* 30.3*  PLT 94*  --  91*    BMET:  Recent Labs  02/08/16 0906  02/09/16 2118 02/10/16 0400  NA 136  < > 140 139  K 4.0  < > 4.5 4.0  CL 101  < > 107 112*  CO2 23  --   --  19*  GLUCOSE 88  < > 126* 135*  BUN 16  < > 16 14  CREATININE 0.77  < > 1.00 1.24*  CALCIUM 9.3  --   --  7.9*  < > = values in this  interval not displayed.   PT/INR:   Recent Labs  02/09/16 1450  LABPROT 24.3*  INR 2.20*    CBG (last 3)   Recent Labs  02/09/16 1946 02/10/16 0013 02/10/16 0400  GLUCAP 114* 203* 138*    ABG    Component Value Date/Time   PHART 7.391 02/10/2016 0401   PCO2ART 30.8* 02/10/2016 0401   PO2ART 205* 02/10/2016 0401   HCO3 18.3* 02/10/2016 0401   TCO2 19.2 02/10/2016 0401   ACIDBASEDEF 5.7* 02/10/2016 0401   O2SAT 99.5 02/10/2016 0401    CXR: PORTABLE CHEST 1 VIEW  COMPARISON: 02/09/2016 at 1500 hours  FINDINGS: Endotracheal tube tip now 4.3 cm above the carina. Other support devices are stable.  Stable cardiac enlargement and vascular congestion. Mild bilateral lower lobe atelectasis.  IMPRESSION: Mild bibasilar atelectasis.   Electronically Signed  By: Esperanza Heir M.D.  On: 02/10/2016 07:07   Assessment/Plan: S/P Procedure(s) (LRB): AORTIC VALVE REPLACEMENT USING A SIZE 23 CARBOMEDICS PROSTHESIS AND RESECTION OF SUBVALVAR AORTIC STENOSIS (N/A) MAZE (N/A) TRANSESOPHAGEAL ECHOCARDIOGRAM (TEE) (N/A) PATENT DUCTUS ARTERIOSUS (PDA) REPAIR (N/A)  Overall stable POD1 Maintaining AAI paced rhythm w/ stable hemodynamics on low dose milrinone, PA pressures low Breathing spontaneously on vent w/ normal oxygenation, PaO2 >200 and sats  100% on 50% FiO2, CXR looks clear Post-op delirium versus delayed effects of anesthesia - not following commands but neuro exam entirely non-focal - looks okay to proceed w/ extubation Acute kidney injury w/ creatinine up slightly 1.2 - making good UOP Expected post op acute blood loss anemia, Hgb 10.0 stable Post op thrombocytopenia, platelet count 91k stable   Acute vent wean and possibly extubate  Watch neuro exam closely  Mobilize once extubated  Hold all sedation and pain meds for now  Wean milrinone slowly  Watch renal function, platelet count   Purcell Nails, MD 02/10/2016 7:45 AM

## 2016-02-10 NOTE — Progress Notes (Addendum)
2245 pt again awoke combative and thrashing legs and head around bed. Pt again unable to be calmed down by staff, unable or unwilling to follow any commands, eyes are open and pt will look at you when you call her name but will not do anything to command, continues to kick legs all around bed, now swinging at staff with arms. 2 mg Morphine given with no change in disposition. After several more minutes and attempts to calm pt 1mg  Versed was given and Precedex gtt restarted at a low rate. Will await appropriate response from patient before attempting wean from ventilator.    0530 Pt continues to wake up throughout the night combative, biting the ET tube, RR 30s, thrashing legs and head around bed. Pt unable to calmly follow commands. Pupils equal and reactive, moving all extremities equally. Verbally counseled by multiple staff members on importance of remaining calm and following our directions for her safety and to progress her care with out the ventilator, pt shows no signs of compliance or understanding.

## 2016-02-10 NOTE — Progress Notes (Signed)
TCTS BRIEF SICU PROGRESS NOTE  1 Day Post-Op  S/P Procedure(s) (LRB): AORTIC VALVE REPLACEMENT USING A SIZE 23 CARBOMEDICS PROSTHESIS AND RESECTION OF SUBVALVAR AORTIC STENOSIS (N/A) MAZE (N/A) TRANSESOPHAGEAL ECHOCARDIOGRAM (TEE) (N/A) PATENT DUCTUS ARTERIOSUS (PDA) REPAIR (N/A)   Extubated uneventfully. Still not following commands.  Moving very purposefully but thrashing about and moaning. Sinus w/ PAC's BP stable off Neo on low dose milrinone O2 sats 99% on 2 L/min via [place] UOP adequate  Plan: CT head ordered - still pending.  Will ask for Neurology to see.  May need to restart Precedex if she becomes any more agitated.  Purcell Nails, MD 02/10/2016 5:22 PM

## 2016-02-10 NOTE — Procedures (Signed)
Extubation Procedure Note  Patient Details:   Name: Sheila Branch DOB: Nov 22, 1970 MRN: [id number]   Airway Documentation:  Airway 8 mm (Active)  Secured at (cm) 23 cm 02/10/2016  3:26 AM  Measured From Lips 02/10/2016  3:26 AM  Secured Location Right 02/10/2016  3:26 AM  Secured By Caron Presume Tape 02/10/2016  3:26 AM  Cuff Pressure (cm H2O) 24 cm H2O 02/09/2016 11:38 PM  Site Condition Dry 02/10/2016  3:26 AM  Pt unable to perform IS at this time. Not following commands to perform weaning parameters.   Evaluation  O2 sats: stable throughout Complications: No apparent complications Patient did tolerate procedure well. Bilateral Breath Sounds: Clear   No  Shawnia Vizcarrondo Tobi Bastos 02/10/2016, 9:10 AM

## 2016-02-11 ENCOUNTER — Inpatient Hospital Stay (HOSPITAL_COMMUNITY): Payer: Self-pay

## 2016-02-11 LAB — CBC
HEMATOCRIT: 26.4 % — AB (ref 36.0–46.0)
Hemoglobin: 8.3 g/dL — ABNORMAL LOW (ref 12.0–15.0)
MCH: 29.7 pg (ref 26.0–34.0)
MCHC: 31.4 g/dL (ref 30.0–36.0)
MCV: 94.6 fL (ref 78.0–100.0)
PLATELETS: 70 10*3/uL — AB (ref 150–400)
RBC: 2.79 MIL/uL — ABNORMAL LOW (ref 3.87–5.11)
RDW: 16 % — AB (ref 11.5–15.5)
WBC: 10.3 10*3/uL (ref 4.0–10.5)

## 2016-02-11 LAB — GLUCOSE, CAPILLARY
GLUCOSE-CAPILLARY: 111 mg/dL — AB (ref 65–99)
GLUCOSE-CAPILLARY: 94 mg/dL (ref 65–99)
Glucose-Capillary: 127 mg/dL — ABNORMAL HIGH (ref 65–99)
Glucose-Capillary: 101 mg/dL — ABNORMAL HIGH (ref 65–99)
Glucose-Capillary: 84 mg/dL (ref 65–99)
Glucose-Capillary: 122 mg/dL — ABNORMAL HIGH (ref 65–99)

## 2016-02-11 LAB — POCT I-STAT 7, (LYTES, BLD GAS, ICA,H+H)
Acid-base deficit: 4 mmol/L — ABNORMAL HIGH (ref 0.0–2.0)
Bicarbonate: 22.3 mEq/L (ref 20.0–24.0)
Calcium, Ion: 0.86 mmol/L — ABNORMAL LOW (ref 1.12–1.23)
HEMATOCRIT: 21 % — AB (ref 36.0–46.0)
HEMOGLOBIN: 7.1 g/dL — AB (ref 12.0–15.0)
O2 SAT: 74 %
PCO2 ART: 47.3 mmHg — AB (ref 35.0–45.0)
PH ART: 7.283 — AB (ref 7.350–7.450)
POTASSIUM: 4.6 mmol/L (ref 3.5–5.1)
Patient temperature: 37.3
Sodium: 137 mmol/L (ref 135–145)
TCO2: 24 mmol/L (ref 0–100)
pO2, Arterial: 45 mmHg — ABNORMAL LOW (ref 80.0–100.0)

## 2016-02-11 LAB — CARBOXYHEMOGLOBIN
CARBOXYHEMOGLOBIN: 1.5 % (ref 0.5–1.5)
Methemoglobin: 0.6 % (ref 0.0–1.5)
O2 Saturation: 54.2 %
Total hemoglobin: 8.6 g/dL — ABNORMAL LOW (ref 12.0–16.0)

## 2016-02-11 LAB — BASIC METABOLIC PANEL
Anion gap: 5 (ref 5–15)
BUN: 15 mg/dL (ref 6–20)
CHLORIDE: 112 mmol/L — AB (ref 101–111)
CO2: 27 mmol/L (ref 22–32)
CREATININE: 0.91 mg/dL (ref 0.44–1.00)
Calcium: 8.5 mg/dL — ABNORMAL LOW (ref 8.9–10.3)
GFR calc Af Amer: >60 mL/min (ref >60)
GFR calc non Af Amer: >60 mL/min (ref >60)
GLUCOSE: 117 mg/dL — AB (ref 65–99)
POTASSIUM: 4.4 mmol/L (ref 3.5–5.1)
SODIUM: 144 mmol/L (ref 135–145)

## 2016-02-11 MED ORDER — WARFARIN SODIUM 2.5 MG PO TABS
2.5000 mg | ORAL_TABLET | Freq: Every day | ORAL | Status: DC
  Administered 2016-02-11 – 2016-02-12 (×2): 2.5 mg via ORAL
  Filled 2016-02-11 (×2): qty 1

## 2016-02-11 MED ORDER — FUROSEMIDE 10 MG/ML IJ SOLN
20.0000 mg | Freq: Four times a day (QID) | INTRAMUSCULAR | Status: AC
  Administered 2016-02-11 (×3): 20 mg via INTRAVENOUS
  Filled 2016-02-11 (×3): qty 2

## 2016-02-11 MED ORDER — WARFARIN - PHYSICIAN DOSING INPATIENT
Freq: Every day | Status: DC
Start: 2016-02-11 — End: 2016-02-17
  Administered 2016-02-11: 18:00:00

## 2016-02-11 MED ORDER — ALPRAZOLAM 0.25 MG PO TABS: 0.2500 mg | ORAL_TABLET | Freq: Three times a day (TID) | ORAL | Status: DC | PRN

## 2016-02-11 MED ORDER — ACETAMINOPHEN 500 MG PO TABS
1000.0000 mg | ORAL_TABLET | Freq: Three times a day (TID) | ORAL | Status: AC
  Administered 2016-02-11 – 2016-02-12 (×4): 1000 mg via ORAL
  Filled 2016-02-11 (×4): qty 2

## 2016-02-11 MED ORDER — DOCUSATE SODIUM 100 MG PO CAPS
100.0000 mg | ORAL_CAPSULE | Freq: Two times a day (BID) | ORAL | Status: DC
  Administered 2016-02-11 – 2016-02-17 (×11): 100 mg via ORAL
  Filled 2016-02-11 (×13): qty 1

## 2016-02-11 MED ORDER — PANTOPRAZOLE SODIUM 40 MG PO TBEC
40.0000 mg | DELAYED_RELEASE_TABLET | Freq: Every day | ORAL | Status: DC
  Administered 2016-02-11 – 2016-02-17 (×7): 40 mg via ORAL
  Filled 2016-02-11 (×7): qty 1

## 2016-02-11 MED ORDER — ASPIRIN EC 81 MG PO TBEC
81.0000 mg | DELAYED_RELEASE_TABLET | Freq: Every day | ORAL | Status: DC
  Administered 2016-02-11 – 2016-02-17 (×7): 81 mg via ORAL
  Filled 2016-02-11 (×7): qty 1

## 2016-02-11 MED ORDER — HYDRALAZINE HCL 20 MG/ML IJ SOLN: 10.0000 mg | Freq: Four times a day (QID) | INTRAMUSCULAR | Status: DC | PRN

## 2016-02-11 MED ORDER — TRAMADOL HCL 50 MG PO TABS
50.0000 mg | ORAL_TABLET | Freq: Four times a day (QID) | ORAL | Status: DC | PRN
  Administered 2016-02-11 – 2016-02-14 (×8): 50 mg via ORAL
  Filled 2016-02-11 (×9): qty 1

## 2016-02-11 NOTE — Progress Notes (Signed)
Patient was taken to CT scan of her head around 0200-0230 am but was unable to scan due to agitation and constant movement of head. Patient doesn't follow any commands or answer any questions. Posey belt restraint applied for safety since the patient was trying to get out of bed constantly. Patient is sleeping currently but cries/mourn every time she wakes up. Will continue to monitor closely.

## 2016-02-11 NOTE — Progress Notes (Signed)
TCTS BRIEF SICU PROGRESS NOTE  2 Days Post-Op  S/P Procedure(s) (LRB): AORTIC VALVE REPLACEMENT USING A SIZE 23 CARBOMEDICS PROSTHESIS AND RESECTION OF SUBVALVAR AORTIC STENOSIS (N/A) MAZE (N/A) TRANSESOPHAGEAL ECHOCARDIOGRAM (TEE) (N/A) PATENT DUCTUS ARTERIOSUS (PDA) REPAIR (N/A)   Looks great.  Delirium has resolved. Just ambulated around SICU Maintaining NSR w/ stable BP Breathing comfortably w/ O2 sats 95-97% Diuresing well  Plan: Continue current plan  Purcell Nails, MD 02/11/2016 4:59 PM

## 2016-02-11 NOTE — Progress Notes (Signed)
[address]       [place] [address]             [phone]        CARDIOTHORACIC SURGERY PROGRESS NOTE   R2 Days Post-Op Procedure(s) (LRB): AORTIC VALVE REPLACEMENT USING A SIZE 23 CARBOMEDICS PROSTHESIS AND RESECTION OF SUBVALVAR AORTIC STENOSIS (N/A) MAZE (N/A) TRANSESOPHAGEAL ECHOCARDIOGRAM (TEE) (N/A) PATENT DUCTUS ARTERIOSUS (PDA) REPAIR (N/A)  Subjective: Dramatically improved.  Still emotional but alert and oriented to name and place.  Follows commands appropriately.  Denies pain.  Frightened.  Objective: Vital signs: BP Readings from Last 1 Encounters:  02/11/16 156/80   Pulse Readings from Last 1 Encounters:  02/11/16 51   Resp Readings from Last 1 Encounters:  02/11/16 19   Temp Readings from Last 1 Encounters:  02/11/16 97.9 F (36.6 C) Axillary    Hemodynamics: PAP: (34-46)/(14-21) 43/19 mmHg  Physical Exam:  Rhythm:   Sinus brady  Breath sounds: Coarse but clear  Heart sounds:  RRR  Incisions:  Dressing dry, intact  Abdomen:  Soft, non-distended, non-tender  Extremities:  Warm, well-perfused    Intake/Output from previous day: 06/02 0701 - 06/03 0700 In: 516.8 [I.V.:386.8; NG/GT:30; IV Piggyback:100] Out: 1257 [Urine:977; Emesis/NG output:50; Chest Tube:230] Intake/Output this shift:    Lab Results:  CBC: Recent Labs  02/10/16 1724 02/11/16 0356  WBC 14.6* 10.3  HGB 9.5* 8.3*  HCT 29.6* 26.4*  PLT 96* 70*    BMET:  Recent Labs  02/10/16 0400 02/10/16 1702 02/10/16 1724 02/11/16 0356  NA 139 144  --  144  K 4.0 4.4  --  4.4  CL 112* 108  --  112*  CO2 19*  --   --  27  GLUCOSE 135* 136*  --  117*  BUN 14 14  --  15  CREATININE 1.24* 0.90 1.04* 0.91  CALCIUM 7.9*  --   --  8.5*     PT/INR:   Recent Labs  02/09/16 1450  LABPROT 24.3*  INR 2.20*    CBG (last 3)   Recent Labs  02/10/16 1918 02/10/16 2307 02/11/16 0415  GLUCAP 144* 127* 111*    ABG    Component Value Date/Time   PHART 7.314* 02/10/2016 1137   PCO2ART 45.0 02/10/2016 1137   PO2ART 105.0* 02/10/2016 1137   HCO3 22.9 02/10/2016 1137   TCO2 22 02/10/2016 1702   ACIDBASEDEF 3.0* 02/10/2016 1137   O2SAT 54.2 02/11/2016 0426    CXR: PORTABLE CHEST 1 VIEW  COMPARISON: 60 2017  FINDINGS: Endotracheal tube, enteric tube, and mediastinal tubes have been removed. The Swan-Ganz catheter has also been removed. Right jugular sheath remains in place. Additional right jugular central venous catheter also remains and terminates over the lower SVC. The cardiomediastinal silhouette is unchanged. Sequelae of aortic valve replacement and left atrial appendage clipping are again identified. Pulmonary vascular congestion and bilateral interstitial opacities have mildly increased. No sizable pleural effusion or pneumothorax is identified.  IMPRESSION: Interval extubation. Cardiomegaly with increasing pulmonary vascular congestion and interstitial type opacities likely reflecting edema.   Electronically Signed  By: Sebastian Ache M.D.  On: 02/11/2016 07:40  Assessment/Plan: S/P Procedure(s) (LRB): AORTIC VALVE REPLACEMENT USING A SIZE 23 CARBOMEDICS PROSTHESIS AND RESECTION OF SUBVALVAR AORTIC STENOSIS (N/A) MAZE (N/A) TRANSESOPHAGEAL ECHOCARDIOGRAM (TEE) (N/A) PATENT DUCTUS ARTERIOSUS (PDA) REPAIR (N/A)  Overall stable POD2 Maintaining NSR w/ stable BP off all drips excepte milrinone 0.1 - somewhat hypertensive Breathing comfortably w/ O2 sats  99% on 2 L/min via [place] Post op delirium much improved Brain CT never done and unable to reach neurologist on call for consult Expected post op acute blood loss anemia, Hgb down somewhat to 8.3 Expected post op atelectasis, mild Chronic diastolic CHF with expected post-op volume excess, weight up 5 lbs and looks swollen Post op thrombocytopenia, platelet count down somewhat 70k   Hold off on brain CT for now  Stop Precedex and monitor neuro status  closely  Mobilize  Advance diet  Diuresis  Start coumadin  Watch anemia, platelet count  SCD boots - no pharmacologic DVT prophylaxis until thrombocytopenia resolves    Purcell Nails, MD 02/11/2016 7:54 AM

## 2016-02-12 ENCOUNTER — Inpatient Hospital Stay (HOSPITAL_COMMUNITY): Payer: Self-pay

## 2016-02-12 LAB — BASIC METABOLIC PANEL
ANION GAP: 7 (ref 5–15)
BUN: 19 mg/dL (ref 6–20)
CALCIUM: 8.1 mg/dL — AB (ref 8.9–10.3)
CO2: 30 mmol/L (ref 22–32)
Chloride: 101 mmol/L (ref 101–111)
Creatinine, Ser: 0.93 mg/dL (ref 0.44–1.00)
GFR calc Af Amer: >60 mL/min (ref >60)
GLUCOSE: 88 mg/dL (ref 65–99)
POTASSIUM: 3 mmol/L — AB (ref 3.5–5.1)
SODIUM: 138 mmol/L (ref 135–145)

## 2016-02-12 LAB — CBC
HEMATOCRIT: 26.4 % — AB (ref 36.0–46.0)
HEMOGLOBIN: 8.7 g/dL — AB (ref 12.0–15.0)
MCH: 30 pg (ref 26.0–34.0)
MCHC: 33 g/dL (ref 30.0–36.0)
MCV: 91 fL (ref 78.0–100.0)
Platelets: 95 10*3/uL — ABNORMAL LOW (ref 150–400)
RBC: 2.9 MIL/uL — ABNORMAL LOW (ref 3.87–5.11)
RDW: 15.8 % — AB (ref 11.5–15.5)
WBC: 11 10*3/uL — AB (ref 4.0–10.5)

## 2016-02-12 LAB — GLUCOSE, CAPILLARY
GLUCOSE-CAPILLARY: 71 mg/dL (ref 65–99)
GLUCOSE-CAPILLARY: 82 mg/dL (ref 65–99)
GLUCOSE-CAPILLARY: 94 mg/dL (ref 65–99)

## 2016-02-12 LAB — PROTIME-INR
INR: 1.65 — AB (ref 0.00–1.49)
PROTHROMBIN TIME: 19.5 s — AB (ref 11.6–15.2)

## 2016-02-12 MED ORDER — BISACODYL 10 MG RE SUPP: 10.0000 mg | Freq: Every day | RECTAL | Status: DC | PRN

## 2016-02-12 MED ORDER — MOVING RIGHT ALONG BOOK
Freq: Once | Status: AC
  Administered 2016-02-12: 09:00:00
  Filled 2016-02-12: qty 1

## 2016-02-12 MED ORDER — POTASSIUM CHLORIDE 10 MEQ/50ML IV SOLN
10.0000 meq | INTRAVENOUS | Status: AC
  Administered 2016-02-12 (×2): 10 meq via INTRAVENOUS
  Filled 2016-02-12 (×2): qty 50

## 2016-02-12 MED ORDER — OXYCODONE HCL 5 MG PO TABS
5.0000 mg | ORAL_TABLET | ORAL | Status: DC | PRN
  Administered 2016-02-14 – 2016-02-16 (×5): 5 mg via ORAL
  Filled 2016-02-12 (×5): qty 1

## 2016-02-12 MED ORDER — SODIUM CHLORIDE 0.9% FLUSH
3.0000 mL | Freq: Two times a day (BID) | INTRAVENOUS | Status: DC
  Administered 2016-02-12 – 2016-02-13 (×3): 3 mL via INTRAVENOUS

## 2016-02-12 MED ORDER — FUROSEMIDE 10 MG/ML IJ SOLN
20.0000 mg | Freq: Two times a day (BID) | INTRAMUSCULAR | Status: AC
  Administered 2016-02-12 (×2): 20 mg via INTRAVENOUS
  Filled 2016-02-12 (×2): qty 2

## 2016-02-12 MED ORDER — AMIODARONE HCL 200 MG PO TABS
200.0000 mg | ORAL_TABLET | Freq: Every day | ORAL | Status: DC
  Administered 2016-02-12 – 2016-02-17 (×6): 200 mg via ORAL
  Filled 2016-02-12 (×6): qty 1

## 2016-02-12 MED ORDER — SODIUM CHLORIDE 0.9 % IV SOLN: 250.0000 mL | INTRAVENOUS | Status: DC | PRN

## 2016-02-12 MED ORDER — LISINOPRIL 5 MG PO TABS
5.0000 mg | ORAL_TABLET | Freq: Every day | ORAL | Status: DC
  Administered 2016-02-12: 5 mg via ORAL
  Filled 2016-02-12: qty 1

## 2016-02-12 MED ORDER — SODIUM CHLORIDE 0.9% FLUSH: 3.0000 mL | INTRAVENOUS | Status: DC | PRN

## 2016-02-12 MED ORDER — METOPROLOL TARTRATE 12.5 MG HALF TABLET
12.5000 mg | ORAL_TABLET | Freq: Two times a day (BID) | ORAL | Status: DC
Start: 2016-02-12 — End: 2016-02-17
  Administered 2016-02-12 – 2016-02-17 (×11): 12.5 mg via ORAL
  Filled 2016-02-12 (×11): qty 1

## 2016-02-12 MED ORDER — POTASSIUM CHLORIDE CRYS ER 20 MEQ PO TBCR
20.0000 meq | EXTENDED_RELEASE_TABLET | Freq: Two times a day (BID) | ORAL | Status: DC
  Administered 2016-02-13 – 2016-02-17 (×9): 20 meq via ORAL
  Filled 2016-02-12 (×9): qty 1

## 2016-02-12 MED ORDER — FUROSEMIDE 20 MG PO TABS: 20.0000 mg | ORAL_TABLET | Freq: Two times a day (BID) | ORAL | Status: DC

## 2016-02-12 MED ORDER — POTASSIUM CHLORIDE 10 MEQ/50ML IV SOLN
10.0000 meq | Freq: Once | INTRAVENOUS | Status: AC
  Administered 2016-02-12: 10 meq via INTRAVENOUS
  Filled 2016-02-12: qty 50

## 2016-02-12 MED ORDER — FUROSEMIDE 20 MG PO TABS
20.0000 mg | ORAL_TABLET | Freq: Two times a day (BID) | ORAL | Status: AC
  Administered 2016-02-13 – 2016-02-14 (×4): 20 mg via ORAL
  Filled 2016-02-12 (×4): qty 1

## 2016-02-12 MED ORDER — BISACODYL 5 MG PO TBEC: 10.0000 mg | DELAYED_RELEASE_TABLET | Freq: Every day | ORAL | Status: DC | PRN

## 2016-02-12 NOTE — Progress Notes (Addendum)
      [address]       [place] [address]             [phone]        CARDIOTHORACIC SURGERY PROGRESS NOTE   R3 Days Post-Op Procedure(s) (LRB): AORTIC VALVE REPLACEMENT USING A SIZE 23 CARBOMEDICS PROSTHESIS AND RESECTION OF SUBVALVAR AORTIC STENOSIS (N/A) MAZE (N/A) TRANSESOPHAGEAL ECHOCARDIOGRAM (TEE) (N/A) PATENT DUCTUS ARTERIOSUS (PDA) REPAIR (N/A)  Subjective: Feels well.  Mild nausea and soreness in chest.  No SOB.  Slept some last night.  Objective: Vital signs: BP Readings from Last 1 Encounters:  02/12/16 165/71   Pulse Readings from Last 1 Encounters:  02/12/16 60   Resp Readings from Last 1 Encounters:  02/12/16 14   Temp Readings from Last 1 Encounters:  02/12/16 98 F (36.7 C) Oral    Hemodynamics:    Physical Exam:  Rhythm:   Sinus w/ PAC's  Breath sounds: clear  Heart sounds:  RRR  Incisions:  Dressing dry, intact  Abdomen:  Soft, non-distended, non-tender  Extremities:  Warm, well-perfused    Intake/Output from previous day: 06/03 0701 - 06/04 0700 In: 960 [P.O.:960] Out: 1985 [Urine:1985] Intake/Output this shift:    Lab Results:  CBC: Recent Labs  02/11/16 0356 02/12/16 0407  WBC 10.3 11.0*  HGB 8.3* 8.7*  HCT 26.4* 26.4*  PLT 70* 95*    BMET:  Recent Labs  02/11/16 0356 02/12/16 0407  NA 144 138  K 4.4 3.0*  CL 112* 101  CO2 27 30  GLUCOSE 117* 88  BUN 15 19  CREATININE 0.91 0.93  CALCIUM 8.5* 8.1*     PT/INR:   Recent Labs  02/12/16 0407  LABPROT 19.5*  INR 1.65*    CBG (last 3)   Recent Labs  02/11/16 2336 02/12/16 0347 02/12/16 0746  GLUCAP 71 94 82    ABG    Component Value Date/Time   PHART 7.314* 02/10/2016 1137   PCO2ART 45.0 02/10/2016 1137   PO2ART 105.0* 02/10/2016 1137   HCO3 22.9 02/10/2016 1137   TCO2 22 02/10/2016 1702   ACIDBASEDEF 3.0* 02/10/2016 1137   O2SAT 54.2 02/11/2016 0426    CXR: PORTABLE CHEST 1 VIEW  COMPARISON:  02/11/2016  FINDINGS: Right central line remains in place, unchanged. Prior median sternotomy and valve replacement. Cardiomegaly with vascular congestion. Improving bilateral airspace opacities, likely improving edema. Mild residual opacity/ edema per cyst. Small bilateral effusions.  IMPRESSION: Improving pulmonary edema pattern with mild residual edema/ CHF.  Small bilateral effusions.   Electronically Signed  By: Charlett Nose M.D.  On: 02/12/2016 07:46   Assessment/Plan: S/P Procedure(s) (LRB): AORTIC VALVE REPLACEMENT USING A SIZE 23 CARBOMEDICS PROSTHESIS AND RESECTION OF SUBVALVAR AORTIC STENOSIS (N/A) MAZE (N/A) TRANSESOPHAGEAL ECHOCARDIOGRAM (TEE) (N/A) PATENT DUCTUS ARTERIOSUS (PDA) REPAIR (N/A)  Doing well POD3 Maintaining NSR w/ frequent PAC's, BP stable - somewhat elevated Breathing comfortably w/ O2 sats 99-100% on 2 L/min via [place] Post op delirium has resolved Expected post op acute blood loss anemia, Hgb stable 8.7 Expected post op atelectasis, mild Acute on chronic diastolic CHF with expected post-op volume excess, diuresing fairly well Post op thrombocytopenia, platelet count up 95k Hypokalemia, diuretic-induced   Restart metoprolol and amiodarone  Start low dose ACE-I  Mobilize  Diuresis  Coumadin  Supplement potassium  Transfer step down   Purcell Nails, MD 02/12/2016 8:19 AM

## 2016-02-12 NOTE — Progress Notes (Signed)
Patient sitting on side of bed. Was able to void after Foley removed. Pain meds given, call light within reach.

## 2016-02-13 ENCOUNTER — Inpatient Hospital Stay (HOSPITAL_COMMUNITY): Payer: Self-pay

## 2016-02-13 LAB — CBC
HCT: 30.4 % — ABNORMAL LOW (ref 36.0–46.0)
Hemoglobin: 9.7 g/dL — ABNORMAL LOW (ref 12.0–15.0)
MCH: 29 pg (ref 26.0–34.0)
MCHC: 31.9 g/dL (ref 30.0–36.0)
MCV: 90.7 fL (ref 78.0–100.0)
PLATELETS: 139 10*3/uL — AB (ref 150–400)
RBC: 3.35 MIL/uL — AB (ref 3.87–5.11)
RDW: 15.9 % — AB (ref 11.5–15.5)
WBC: 9.7 10*3/uL (ref 4.0–10.5)

## 2016-02-13 LAB — BASIC METABOLIC PANEL
ANION GAP: 9 (ref 5–15)
BUN: 16 mg/dL (ref 6–20)
CALCIUM: 8.3 mg/dL — AB (ref 8.9–10.3)
CO2: 28 mmol/L (ref 22–32)
Chloride: 100 mmol/L — ABNORMAL LOW (ref 101–111)
Creatinine, Ser: 0.74 mg/dL (ref 0.44–1.00)
GFR calc Af Amer: >60 mL/min (ref >60)
Glucose, Bld: 101 mg/dL — ABNORMAL HIGH (ref 65–99)
POTASSIUM: 3.6 mmol/L (ref 3.5–5.1)
SODIUM: 137 mmol/L (ref 135–145)

## 2016-02-13 LAB — GLUCOSE, CAPILLARY: GLUCOSE-CAPILLARY: 97 mg/dL (ref 65–99)

## 2016-02-13 LAB — PROTIME-INR
INR: 2.58 — ABNORMAL HIGH (ref 0.00–1.49)
PROTHROMBIN TIME: 27.3 s — AB (ref 11.6–15.2)

## 2016-02-13 MED ORDER — WARFARIN SODIUM 2 MG PO TABS: 2.0000 mg | ORAL_TABLET | Freq: Every day | ORAL | Status: DC

## 2016-02-13 MED ORDER — WARFARIN SODIUM 1 MG PO TABS
1.0000 mg | ORAL_TABLET | Freq: Every day | ORAL | Status: DC
  Administered 2016-02-13 – 2016-02-14 (×2): 1 mg via ORAL
  Filled 2016-02-13 (×2): qty 1

## 2016-02-13 MED ORDER — LISINOPRIL 10 MG PO TABS: 20.0000 mg | ORAL_TABLET | Freq: Every day | ORAL | Status: DC

## 2016-02-13 MED ORDER — LISINOPRIL 10 MG PO TABS
10.0000 mg | ORAL_TABLET | Freq: Every day | ORAL | Status: DC
  Administered 2016-02-13: 10 mg via ORAL
  Filled 2016-02-13: qty 1

## 2016-02-13 MED ORDER — POTASSIUM CHLORIDE CRYS ER 20 MEQ PO TBCR
40.0000 meq | EXTENDED_RELEASE_TABLET | Freq: Once | ORAL | Status: AC
  Administered 2016-02-13: 40 meq via ORAL
  Filled 2016-02-13: qty 2

## 2016-02-13 NOTE — Progress Notes (Addendum)
      [address]       [place] [address]             [phone]        4 Days Post-Op Procedure(s) (LRB): AORTIC VALVE REPLACEMENT USING A SIZE 23 CARBOMEDICS PROSTHESIS AND RESECTION OF SUBVALVAR AORTIC STENOSIS (N/A) MAZE (N/A) TRANSESOPHAGEAL ECHOCARDIOGRAM (TEE) (N/A) PATENT DUCTUS ARTERIOSUS (PDA) REPAIR (N/A)  Subjective: Patient without specific   Objective: Vital signs in last 24 hours: Temp:  [97.5 F (36.4 C)-98.2 F (36.8 C)] 98.2 F (36.8 C) (06/05 0532) Pulse Rate:  [58-107] 62 (06/05 0532) Cardiac Rhythm:  [-] Heart block (06/04 1921) Resp:  [14-17] 16 (06/05 0532) BP: (138-187)/(67-92) 139/75 mmHg (06/05 0532) SpO2:  [96 %-100 %] 97 % (06/05 0532) Weight:  [96 lb 8 oz (43.772 kg)] 96 lb 8 oz (43.772 kg) (06/05 0532)  Pre op weight 41 kg Current Weight  02/13/16 96 lb 8 oz (43.772 kg)      Intake/Output from previous day: 06/04 0701 - 06/05 0700 In: -  Out: 900 [Urine:900]   Physical Exam:  Cardiovascular: IRRR Pulmonary: Slightly diminished at bases Abdomen: Soft, non tender, bowel sounds present. Extremities: No lower extremity edema. Wounds: Clean and dry.  No erythema or signs of infection.  Lab Results: CBC: Recent Labs  02/12/16 0407 02/13/16 0411  WBC 11.0* 9.7  HGB 8.7* 9.7*  HCT 26.4* 30.4*  PLT 95* 139*   BMET:  Recent Labs  02/12/16 0407 02/13/16 0411  NA 138 137  K 3.0* 3.6  CL 101 100*  CO2 30 28  GLUCOSE 88 101*  BUN 19 16  CREATININE 0.93 0.74  CALCIUM 8.1* 8.3*    PT/INR:  Lab Results  Component Value Date   INR 2.58* 02/13/2016   INR 1.65* 02/12/2016   INR 2.20* 02/09/2016   ABG:  INR: Will add last result for INR, ABG once components are confirmed Will add last 4 CBG results once components are confirmed  Assessment/Plan:  1. CV - Frequent PACs. On Amiodarone 200 mg daily, Lisinopril 5 mg daily, Lopressor 12.5 mg bid, and Coumadin. Will increase Lisinopril for better BP  control. INR increased from 1.65 to 2.58. She has a mechanical valve, but a large increase with 2.5 so will give 2 tonight. 2.  Pulmonary - On room air. CXR this am shows no pneumothorax, small bilateral pleural effusions, right base atelectasis. Encourage incentive spirometer. 3. Acute on chronic CHF-On Lasix 20 mg bid 4.  Acute blood loss anemia - H and H stable at 9.7 and 30.4 5. Mild thrombocytopenia-platelets up to 139,000 6. Supplement potassium 7. Would like to remove EPW as INR will continue to increase, but will discuss with Dr. Nino Glow MPA-C 02/13/2016,7:39 AM  I have seen and examined the patient and agree with the assessment and plan as outlined.  Will decrease warfarin dose to 1 mg tonight given rapid rise in INR.   Purcell Nails, MD 02/13/2016 8:34 AM

## 2016-02-13 NOTE — Progress Notes (Signed)
CARDIAC REHAB PHASE I   PRE:  Rate/Rhythm: 65 SR  BP:  Sitting: 154/77        SaO2: 98 RA  MODE:  Ambulation: 350 ft   POST:  Rate/Rhythm: 74 SR c/ frequent PACs  BP:  Sitting: 142/92 manual         SaO2: 99 RA  Pt fairly independent, needed verbal cues for sternal precautions, stood independently. Pt ambulated 350 ft on RA, IV, assist x1, slow, steady gait, tolerated well with no complaints other than fatigue. Reviewed IS and encouraged use, encouraged ambulation x 2 more today. Pt verbalized understanding. Pt to edge of bed per pt request after walk, call bell within reach. Will follow.   [phone] Joylene Grapes, RN, BSN 02/13/2016 9:03 AM

## 2016-02-13 NOTE — Hospital Discharge Follow-Up (Signed)
Met with the patient and her brother this afternoon. He will be staying with her after discharge and can assist her as needed. She also said that her daughter will be staying with for a few days after she gets home. .  She reported no problem accessing food.   She stated that she will obtain any new medication from the [facility] pharmacy.  An appointment was scheduled for her with the [facility] for 02/21/16 _0  and the information was placed on the AVS. She said that she may need transportation to the clinic and she is aware that the [facility] can provide transportation if needed.   Update provided to Marvetta Gibbons, RN CM.

## 2016-02-13 NOTE — Progress Notes (Signed)
Removed epicardial pacing wires. Wires were intact. 2 sutures removed. Patient tolerated well. Cleansed sites. Vitals stable. Bed rest for one hour. Will continue to monitor.  [place] Ends

## 2016-02-13 NOTE — Discharge Summary (Signed)
Physician Discharge Summary       [address] [place].[address]       [name] [address]             [phone]    Patient ID: Sheila Branch MRN: [id number] DOB/AGE: December 22, 1970 45 y.o.  Admit date: 02/09/2016 Discharge date: 02/17/2016  Admission Diagnoses: 1. Subvalvular aortic stenosis 2. Severe aortic insufficiency 3. Recurrent, persistent atrial fibrillation  Active Diagnoses:  1. Patent ductus arteriosus 2. CHD (congenital heart disease) 3. Chronic combined systolic and diastolic heart failure (HCC) 4. COPD (chronic obstructive pulmonary disease) (HCC) 5. Anxiety 6. History of tobacco abuse 7. ABL anemia 8. Mild thrombocytopenia  Procedure (s):   Resection of Sub-Valvar Aortic Membrane   Aortic Valve Replacement Sorin Carbomedics Top Hat Bileaflet Mechanical Valve (size 23mm, catalog [id number], serial [id number])  Maze Procedure Left atrial lesion set using bipolar radiofrequency and cryothermy ablation clipping of left atrial appendage (Atriclip size 35mm)  Ligation of Patent Ductus Arteriosus by Dr. Cornelius Moras on 02/09/2016.   History of Presenting Illness: Patient is a 45 year old female with history of congenital heart disease that has never previously been surgically corrected with been referred for surgical consultation to discuss treatment options for management of 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 The patient states that she has known that she has had a heart murmur all of her life and has been told that she had a "hole in her heart" She apparently underwent cardiac catheterization during her childhood and her mother was told that she needed cardiac surgery. According to the patient her mother insisted on postponing surgical intervention and treating her medically as long as possible. The patient moved to [place] within 20 years ago. She apparently underwent echocardiography and was evaluated by  cardiologist when she was pregnant with her second child approximately 18 years ago. She carried her child to term pregnancy and had an uncomplicated delivery. She never returned for medical follow-up.  The patient states that approximately 9 months ago she began to experience progressive symptoms of exertional shortness of breath Symptoms gradually got worse and became problematic with relatively mild physical activity and occasionally at rest. She developed symptoms of orthopnea, PND and lower extremity edema. He quit smoking approximately 4 months ago but shortness of breath continued to get worse. She was hospitalized acutely from 11/03/2015 through 11/08/2015 for community acquired pneumonia and rapid atrial fibrillation and atrial flutter. Transthoracic echocardiogram performed at that time revealed severe aortic insufficiency with subvalvar aortic stenosis. There was mild left ventricular systolic dysfunction with ejection fraction estimated 50-55%. There was trivial mitral regurgitation but severe left atrial enlargement. There was moderate right atrial enlargement and findings consistent with moderate pulmonary hypertension. The patient underwent TEE and was successfully cardioverted back to sinus rhythm. Transesophageal echocardiogram revealed diffuse "left ventricular systolic dysfunction with ejection fraction estimated 40% There was severe aortic insufficiency and subvalvar aortic membrane. She was discharged from the hospital and seen in follow-up by Nada Boozer on 12/22/2015.   The patient continued to experience symptoms of exertional shortness of breath, chest pain and a productive cough. She subsequently developed GI illness with diarrhea. She was readmitted to the hospital 01/06/2016 with recurrent persistent atrial fibrillation associated with rapid ventricular response. She spontaneously converted back to sinus rhythm on intravenous amiodarone. Diagnostic cardiac catheterization  revealed normal coronary artery anatomy with no significant coronary artery disease. She had severe pulmonary hypertension with PA pressures measured 90/44 andpulmonary capillary wedge pressure reported 17 mmHg. Fick cardiac output  was measured 7.02 L/m corresponding to cardiac index of 4.68. Formal oxygen saturation run was not performed to assess for possible shunt physiology. Cardiothoracic surgical consultation was requested.  The patient is single and lives locally in [place] with her brother. 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 Her mother passed away several years ago. She has to a adult children, the youngest of which is in college locally in [place]. He describes a long-standing history of exertional shortness of breath has progressed over the past year. In recent months she gets short of breath with low level physical activity and occasionally at rest She has occasional episodes of PND and orthopnea. She has had episodes of chest pain or chest tightness that have been exacerbated by tachycardia. She has not had dizzy spells or syncope. She has mild lower extremity edema. Since hospital admission her breathing has improved. She states that her diarrhea has improved and her appetite is slowly returning to normal. The patient was again counseled at length regarding treatment alternatives for management of severe aortic insufficiency with subvalvular aortic stenosis. The indications, risks, potential benefits of aortic valve replacement with resection of subvalvar aortic stenosis had been discussed. Surgical options were discussed at length including conventional surgical aortic valve replacement through either a full median sternotomy or using minimally invasive techniques. Other alternatives including rapid-deployment bioprosthetic tissue valve replacement, transcatheter aortic valve replacement, patch enlargement of the aortic root, stentless porcine aortic root  replacement, valve repair, the Ross autograft procedure, and homograft aortic root replacement were discussed. Discussion was held comparing the relative risks of mechanical valve replacement with need for lifelong anticoagulation versus use of a bioprosthetic tissue valve and the associated potential for late structural valve deterioration and failure. This discussion was placed in the context of the patient's particular circumstances, and as a result the patient specifically requests that their valve be replaced using a mechanical prosthetic valve. The relative risks and benefits of performing a maze procedure at the time of their surgery was discussed at length, including the expected likelihood of long term freedom from recurrent symptomatic atrial fibrillation and/or atrial flutter.Patient agreed to proceed with surgery. She underwent a MAZE procedure, resection of sub valvular aortic membrane, AVR (mechanicl), and ligation of PDA on 02/09/2016.  Brief Hospital Course:  The patient was extubated on post operative day one without difficulty. She remained afebrile and hemodynamically stable. Theone Murdoch, a line, chest tubes, and foley were removed early in the post operative course. Lopressor was started and titrated accordingly. She was started on Coumadin for her mechanical valve. Her INR increased quickly and as a result,she will only need low dose Coumadin. She is on 1 mg and her latest INR was 3.2. She was volume over loaded and diuresed. She had ABL anemia. She did not require a post op transfusion. Her last H and H was up to 9.7 and 30.4. She also had thrombocytopenia. Her last platelet count was up to 134,000. 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 The patient was felt surgically stable for transfer from the ICU to PCTU for further convalescence on  02/12/2016. She continues to progress with cardiac rehab. She was ambulating on room air. She has been tolerating a diet and has had a bowel movement. Epicardial pacing wires have been removed. Chest tube sutures will be removed prior to discharge. The  patient is felt surgically stable for discharge today.   Latest Vital Signs: Blood pressure 126/70, pulse 80, temperature 98.2 F (36.8 C), temperature source Oral, resp. rate 16, height 5\' 3"  (1.6 m), weight 85 lb 6.4 oz (38.737 kg), last menstrual period 01/11/2016, SpO2 98 %.  Physical Exam: Cardiovascular: IRRR Pulmonary: Slightly diminished at bases Abdomen: Soft, non tender, bowel sounds present. Extremities: No lower extremity edema. Wounds: Clean and dry. No erythema or signs of infection.  Discharge Condition:Stable and discharged to home.  Recent laboratory studies:  Lab Results  Component Value Date   WBC 9.7 02/13/2016   HGB 9.7* 02/13/2016   HCT 30.4* 02/13/2016   MCV 90.7 02/13/2016   PLT 139* 02/13/2016   Lab Results  Component Value Date   NA 137 02/13/2016   K 3.6 02/13/2016   CL 100* 02/13/2016   CO2 28 02/13/2016   CREATININE 0.74 02/13/2016   GLUCOSE 101* 02/13/2016    Diagnostic Studies: Dg Chest 2 View  02/13/2016  CLINICAL DATA:  Atelectasis, CHF EXAM: CHEST  2 VIEW COMPARISON:  02/12/2016 FINDINGS: Prior CABG. Cardiomegaly. Small bilateral pleural effusions with bibasilar atelectasis or infiltrates. Underlying COPD. Right central line remains in place, unchanged. IMPRESSION: Small bilateral effusions with bibasilar atelectasis or infiltrates. Cardiomegaly, COPD. Electronically Signed   By: Charlett Nose M.D.   On: 02/13/2016 07:32       Discharge Instructions    Amb Referral to Cardiac Rehabilitation    Complete by:  As directed   Diagnosis:  Valve Replacement  Valve:  Aortic Comment - c/ MAZE         Discharge Medications:   Medication List    STOP taking these medications        furosemide 40  MG tablet  Commonly known as:  LASIX     XARELTO 20 MG Tabs tablet  Generic drug:  rivaroxaban      TAKE these medications        amiodarone 200 MG tablet  Commonly known as:  PACERONE  Take 1 tablet (200 mg total) by mouth daily.     aspirin 81 MG EC tablet  Take 1 tablet (81 mg total) by mouth daily.     lisinopril 20 MG tablet  Commonly known as:  PRINIVIL,ZESTRIL  Take 1 tablet (20 mg total) by mouth daily.     metoprolol tartrate 25 MG tablet  Commonly known as:  LOPRESSOR  Take 0.5 tablets (12.5 mg total) by mouth 2 (two) times daily with a meal.     oxyCODONE-acetaminophen 5-325 MG tablet  Commonly known as:  PERCOCET  Take one or two tablets by mouth every 4-6 hours as needed for moderate to severe  pain.     warfarin 1 MG tablet  Commonly known as:  COUMADIN  Take 1 tablet (1 mg total) by mouth daily at 6 PM. Or as directed        Follow Up Appointments: Follow-up Information    Follow up with Purcell Nails, MD On 03/19/2016.   Specialty:  Cardiothoracic Surgery   Why:  PA/LAT CXR to be taken (at [facility] which is in the same building as Dr. Orvan July office) on 03/19/2016 at 2:15 pm;Appointment time is at 3:00 pm   Contact information:   [address] [place] [address] [phone]       Follow up with Tobias Alexander, MD.   Specialty:  Cardiology   Why:  Call for a follow up  appointment for 2 weeks   Contact information:   [address] [place] [address] [phone]       Follow up with Tobias Alexander, MD.   Specialty:  Cardiology   Why:  Appointment is to have PT an INR (as is on Couamdin for a mechaincal valve) drawn on Monday 02/20/2016   Contact information:   [address] [place] [address] [phone]       Follow up with [facility]. Go on 02/21/2016.   Specialty:  Internal Medicine   Why:  at 10:00am for an appointment with Dr Venetia Night in the  [facility]    Contact information:   [address] [id number] [facility] [address] [place] [address] [phone]      Signed: Doree Fudge MPA-C 02/20/2016, 1:35 PM

## 2016-02-14 ENCOUNTER — Telehealth: Payer: Self-pay

## 2016-02-14 LAB — PROTIME-INR
INR: 3.36 — ABNORMAL HIGH (ref 0.00–1.49)
Prothrombin Time: 33.4 seconds — ABNORMAL HIGH (ref 11.6–15.2)

## 2016-02-14 MED ORDER — LISINOPRIL 10 MG PO TABS
20.0000 mg | ORAL_TABLET | Freq: Every day | ORAL | Status: DC
  Administered 2016-02-14 – 2016-02-17 (×4): 20 mg via ORAL
  Filled 2016-02-14 (×4): qty 2

## 2016-02-14 MED ORDER — WARFARIN VIDEO: Freq: Once | Status: DC

## 2016-02-14 MED ORDER — COUMADIN BOOK
Freq: Once | Status: AC
  Administered 2016-02-14: 1
  Filled 2016-02-14: qty 1

## 2016-02-14 MED ORDER — LISINOPRIL 10 MG PO TABS: 20.0000 mg | ORAL_TABLET | Freq: Two times a day (BID) | ORAL | Status: DC

## 2016-02-14 NOTE — Telephone Encounter (Signed)
This was opened in error, please disregard.

## 2016-02-14 NOTE — Discharge Instructions (Addendum)
Aortic Valve Replacement, Care After Refer to this sheet in the next few weeks. These instructions provide you with information on caring for yourself after your procedure. Your health care provider may also give you specific instructions. Your treatment has been planned according to current medical practices, but problems sometimes occur. Call your health care provider if you have any problems or questions after your procedure. HOME CARE INSTRUCTIONS   Take medicines only as directed by your health care provider.  If your health care provider has prescribed elastic stockings, wear them as directed.  Take frequent naps or rest often throughout the day.  Avoid lifting over 10 lbs (4.5 kg) or pushing or pulling things with your arms for 6-8 weeks or as directed by your health care provider.  Avoid driving or airplane travel for 4-6 weeks after surgery or as directed by your health care provider. If you are riding in a car for an extended period, stop every 1-2 hours to stretch your legs. Keep a record of your medicines and medical history with you when traveling.  Do not drive or operate heavy machinery while taking pain medicine. (narcotics).  Do not cross your legs.  Do not use any tobacco products including cigarettes, chewing tobacco, or electronic cigarettes. If you need help quitting, ask your health care provider.  Do not take baths, swim, or use a hot tub until your health care provider approves. Take showers once your health care provider approves. Pat incisions dry. Do not rub incisions with a washcloth or towel.  Avoid climbing stairs and using the handrail to pull yourself up for the first 2-3 weeks after surgery.  Return to work as directed by your health care provider.  Drink enough fluid to keep your urine clear or pale yellow.  Do not strain to have a bowel movement. Eat high-fiber foods if you become constipated. You may also take a medicine to help you have a bowel  movement (laxative) as directed by your health care provider.  Resume sexual activity as directed by your health care provider. Men should not use medicines for erectile dysfunction until their doctor says it isokay.  If you had a certain type of heart condition in the past, you may need to take antibiotic medicine before having dental work or surgery. Let your dentist and health care providers know if you had one or more of the following:  Previous endocarditis.  An artificial (prosthetic) heart valve.  Congenital heart disease. SEEK MEDICAL CARE IF:  You develop a skin rash.   You experience sudden changes in your weight.  You have a fever. SEEK IMMEDIATE MEDICAL CARE IF:   You develop chest pain that is not coming from your incision.  You have drainage (pus), redness, swelling, or pain at your incision site.   You develop shortness of breath or have difficulty breathing.   You have increased bleeding from your incision site.   You develop light-headedness.  MAKE SURE YOU:   Understand these directions.  Will watch your condition.  Will get help right away if you are not doing well or get worse.   This information is not intended to replace advice given to you by your health care provider. Make sure you discuss any questions you have with your health care provider.   Document Released: 03/15/2005 Document Revised: 09/17/2014 Document Reviewed: 06/10/2012 Elsevier Interactive Patient Education 2016 [employer].  Information on my medicine - Coumadin   (Warfarin)  This medication education was reviewed with  me or my healthcare representative as part of my discharge preparation.  The pharmacist that spoke with me during my hospital stay was:  Almon Hercules, [facility]  Why was Coumadin prescribed for you? Coumadin was prescribed for you because you have a blood clot or a medical condition that can cause an increased risk of forming blood clots. Blood clots can cause  serious health problems by blocking the flow of blood to the heart, lung, or brain. Coumadin can prevent harmful blood clots from forming. As a reminder your indication for Coumadin is:   Blood Clot Prevention After Heart Valve Surgery, atrial fibrillation  What test will check on my response to Coumadin? While on Coumadin (warfarin) you will need to have an INR test regularly to ensure that your dose is keeping you in the desired range. The INR (international normalized ratio) number is calculated from the result of the laboratory test called prothrombin time (PT).  If an INR APPOINTMENT HAS NOT ALREADY BEEN MADE FOR YOU please schedule an appointment to have this lab work done by your health care provider within 7 days. Your INR goal is usually a number between:  2 to 3 or your provider may give you a more narrow range like 2-2.5.  Ask your health care provider during an office visit what your goal INR is.  What  do you need to  know  About  COUMADIN? Take Coumadin (warfarin) exactly as prescribed by your healthcare provider about the same time each day.  DO NOT stop taking without talking to the doctor who prescribed the medication.  Stopping without other blood clot prevention medication to take the place of Coumadin may increase your risk of developing a new clot or stroke.  Get refills before you run out.  What do you do if you miss a dose? If you miss a dose, take it as soon as you remember on the same day then continue your regularly scheduled regimen the next day.  Do not take two doses of Coumadin at the same time.  Important Safety Information A possible side effect of Coumadin (Warfarin) is an increased risk of bleeding. You should call your healthcare provider right away if you experience any of the following: ? Bleeding from an injury or your nose that does not stop. ? Unusual colored urine (red or dark brown) or unusual colored stools (red or black). ? Unusual bruising for unknown  reasons. ? A serious fall or if you hit your head (even if there is no bleeding).  Some foods or medicines interact with Coumadin (warfarin) and might alter your response to warfarin. To help avoid this: ? Eat a balanced diet, maintaining a consistent amount of Vitamin K. ? Notify your provider about major diet changes you plan to make. ? Avoid alcohol or limit your intake to 1 drink for women and 2 drinks for men per day. (1 drink is 5 oz. wine, 12 oz. beer, or 1.5 oz. liquor.)  Make sure that ANY health care provider who prescribes medication for you knows that you are taking Coumadin (warfarin).  Also make sure the healthcare provider who is monitoring your Coumadin knows when you have started a new medication including herbals and non-prescription products.  Coumadin (Warfarin)  Major Drug Interactions  Increased Warfarin Effect Decreased Warfarin Effect  Alcohol (large quantities) Antibiotics (esp. Septra/Bactrim, Flagyl, Cipro) Amiodarone (Cordarone) Aspirin (ASA) Cimetidine (Tagamet) Megestrol (Megace) NSAIDs (ibuprofen, naproxen, etc.) Piroxicam (Feldene) Propafenone (Rythmol SR) Propranolol (Inderal) Isoniazid (INH) Posaconazole (  Noxafil) Barbiturates (Phenobarbital) Carbamazepine (Tegretol) Chlordiazepoxide (Librium) Cholestyramine (Questran) Griseofulvin Oral Contraceptives Rifampin Sucralfate (Carafate) Vitamin K   Coumadin (Warfarin) Major Herbal Interactions  Increased Warfarin Effect Decreased Warfarin Effect  Garlic Ginseng Ginkgo biloba Coenzyme Q10 Green tea St. Johns wort    Coumadin (Warfarin) FOOD Interactions  Eat a consistent number of servings per week of foods HIGH in Vitamin K (1 serving =  cup)  Collards (cooked, or boiled & drained) Kale (cooked, or boiled & drained) Mustard greens (cooked, or boiled & drained) Parsley *serving size only =  cup Spinach (cooked, or boiled & drained) Swiss chard (cooked, or boiled & drained) Turnip  greens (cooked, or boiled & drained)  Eat a consistent number of servings per week of foods MEDIUM-HIGH in Vitamin K (1 serving = 1 cup)  Asparagus (cooked, or boiled & drained) Broccoli (cooked, boiled & drained, or raw & chopped) Brussel sprouts (cooked, or boiled & drained) *serving size only =  cup Lettuce, raw (green leaf, endive, romaine) Spinach, raw Turnip greens, raw & chopped   These websites have more information on Coumadin (warfarin):  http://www.king-russell.com/; https://www.hines.net/;

## 2016-02-14 NOTE — Progress Notes (Signed)
Utilization review completed.  

## 2016-02-14 NOTE — Progress Notes (Signed)
Report received from Kellie Shropshire, Charity fundraiser.  Pt transferred to 2W35 from 2W24, telemetry box changed, CCMD notified.

## 2016-02-14 NOTE — Progress Notes (Signed)
CARDIAC REHAB PHASE I   PRE:  Rate/Rhythm: 63 SR  BP:  Sitting: 143/92        SaO2: 100 RA  MODE:  Ambulation: 700 ft   POST:  Rate/Rhythm: 82 SR  BP:  Sitting: 134/88         SaO2: 99 RA  Pt up in recliner, no complaints. Pt ambulated 700 ft on RA, hand held assist, steady gait, tolerated well with no complaints other than gradual weakness in her legs/mild fatigue. Encouraged additional ambulation x2 today. Pt to recliner after walk, call bell within reach. Will follow.  [phone] Joylene Grapes, RN, BSN 02/14/2016 11:22 AM

## 2016-02-14 NOTE — Progress Notes (Addendum)
      [address]       [place] [address]             [phone]        5 Days Post-Op Procedure(s) (LRB): AORTIC VALVE REPLACEMENT USING A SIZE 23 CARBOMEDICS PROSTHESIS AND RESECTION OF SUBVALVAR AORTIC STENOSIS (N/A) MAZE (N/A) TRANSESOPHAGEAL ECHOCARDIOGRAM (TEE) (N/A) PATENT DUCTUS ARTERIOSUS (PDA) REPAIR (N/A)  Subjective: Patient without specific complaints.  Objective: Vital signs in last 24 hours: Temp:  [97.9 F (36.6 C)] 97.9 F (36.6 C) (06/06 0458) Pulse Rate:  [61-70] 63 (06/06 0458) Cardiac Rhythm:  [-] Heart block (06/05 1900) Resp:  [18] 18 (06/06 0458) BP: (138-152)/(70-97) 150/78 mmHg (06/06 0458) SpO2:  [94 %-100 %] 100 % (06/06 0458) Weight:  [96 lb 6.4 oz (43.727 kg)] 96 lb 6.4 oz (43.727 kg) (06/06 0620)  Pre op weight 41 kg Current Weight  02/14/16 96 lb 6.4 oz (43.727 kg)      Intake/Output from previous day: 06/05 0701 - 06/06 0700 In: 480 [P.O.:480] Out: 1500 [Urine:1500]   Physical Exam:  Cardiovascular: IRRR Pulmonary: Slightly diminished at bases Abdomen: Soft, non tender, bowel sounds present. Extremities: No lower extremity edema. Wounds: Clean and dry.  No erythema or signs of infection.  Lab Results: CBC:  Recent Labs  02/12/16 0407 02/13/16 0411  WBC 11.0* 9.7  HGB 8.7* 9.7*  HCT 26.4* 30.4*  PLT 95* 139*   BMET:   Recent Labs  02/12/16 0407 02/13/16 0411  NA 138 137  K 3.0* 3.6  CL 101 100*  CO2 30 28  GLUCOSE 88 101*  BUN 19 16  CREATININE 0.93 0.74  CALCIUM 8.1* 8.3*    PT/INR:  Lab Results  Component Value Date   INR 3.36* 02/14/2016   INR 2.58* 02/13/2016   INR 1.65* 02/12/2016   ABG:  INR: Will add last result for INR, ABG once components are confirmed Will add last 4 CBG results once components are confirmed  Assessment/Plan:  1. CV - Frequent PACs, HR in the 60's.On Amiodarone 200 mg daily, Lisinopril 20 mg daily, Lopressor 12.5 mg bid, and Coumadin. Still  hypertensive despite Lisinopril increase so will increase to 20 mg bid. INR increased from 2.58 to 3.36. She has a mechanical valve, but a large increase with 2.5 so will give 1 mg again tonight. 2.  Pulmonary - On room air.  Encourage incentive spirometer. 3. Acute on chronic CHF-On Lasix 20 mg bid 4.  Acute blood loss anemia - H and H stable at 9.7 and 30.4 5. Mild thrombocytopenia-platelets up to 139,000   ZIMMERMAN,DONIELLE MPA-C 02/14/2016,7:39 AM    I have seen and examined the patient and agree with the assessment and plan as outlined.  Received only 10 mg of lisinopril yesterday so will increase to 20 mg/day for now.    Purcell Nails, MD 02/14/2016 8:40 AM

## 2016-02-14 NOTE — Progress Notes (Signed)
Removed pt R IJ per order. Removed 2 sutures. Held pressure. Applied dressing. Patient tolerated well. Will continue to monitor.  Minerva Ends

## 2016-02-15 ENCOUNTER — Encounter (HOSPITAL_COMMUNITY): Payer: Self-pay | Admitting: General Practice

## 2016-02-15 LAB — TYPE AND SCREEN
ABO/RH(D): O POS
Antibody Screen: NEGATIVE
UNIT DIVISION: 0
UNIT DIVISION: 0

## 2016-02-15 LAB — PROTIME-INR
INR: 4.19 — ABNORMAL HIGH (ref 0.00–1.49)
Prothrombin Time: 39.4 seconds — ABNORMAL HIGH (ref 11.6–15.2)

## 2016-02-15 NOTE — Progress Notes (Addendum)
      [address]       [place] [address]             [phone]        6 Days Post-Op Procedure(s) (LRB): AORTIC VALVE REPLACEMENT USING A SIZE 23 CARBOMEDICS PROSTHESIS AND RESECTION OF SUBVALVAR AORTIC STENOSIS (N/A) MAZE (N/A) TRANSESOPHAGEAL ECHOCARDIOGRAM (TEE) (N/A) PATENT DUCTUS ARTERIOSUS (PDA) REPAIR (N/A)  Subjective: Patient without specific complaints.  Objective: Vital signs in last 24 hours: Temp:  [97.8 F (36.6 C)-98.3 F (36.8 C)] 97.8 F (36.6 C) (06/07 0524) Pulse Rate:  [65-73] 65 (06/07 0524) Cardiac Rhythm:  [-] Heart block (06/06 1900) BP: (130-155)/(62-80) 136/79 mmHg (06/07 0524) SpO2:  [97 %] 97 % (06/07 0524) Weight:  [97 lb 4.8 oz (44.135 kg)] 97 lb 4.8 oz (44.135 kg) (06/07 0524)  Pre op weight 41 kg Current Weight  02/15/16 97 lb 4.8 oz (44.135 kg)          Physical Exam:  Cardiovascular: RRR, sharp valve click Pulmonary: Slightly diminished at bases Abdomen: Soft, non tender, bowel sounds present. Extremities: No lower extremity edema. Wounds: Clean and dry.  No erythema or signs of infection.  Lab Results: CBC:  Recent Labs  02/13/16 0411  WBC 9.7  HGB 9.7*  HCT 30.4*  PLT 139*   BMET:   Recent Labs  02/13/16 0411  NA 137  K 3.6  CL 100*  CO2 28  GLUCOSE 101*  BUN 16  CREATININE 0.74  CALCIUM 8.3*    PT/INR:  Lab Results  Component Value Date   INR 4.19* 02/15/2016   INR 3.36* 02/14/2016   INR 2.58* 02/13/2016   ABG:  INR: Will add last result for INR, ABG once components are confirmed Will add last 4 CBG results once components are confirmed  Assessment/Plan:  1. CV - Frequent PACs, first degree heart block, HR in the 60's.On Amiodarone 200 mg daily, Lisinopril 20 mg daily, Lopressor 12.5 mg bid, and Coumadin. INR increased from 3.36 to 4.19. She has a mechanical valve, and has been given 1 mg last 2 nights. Will hold today and discuss with Dr. Cornelius Moras. 2.  Pulmonary - On room  air.  Encourage incentive spirometer. 3. Acute on chronic CHF-On Lasix 20 mg bid 4.  Acute blood loss anemia - H and H stable at 9.7 and 30.4 5. Mild thrombocytopenia-platelets up to 139,000   ZIMMERMAN,DONIELLE MPA-C 02/15/2016,8:25 AM   I have seen and examined the patient and agree with the assessment and plan as outlined.  Hold coumadin today.  Possibly ready for D/C home 2-3 days if INR decreased.  Purcell Nails, MD 02/15/2016 8:41 AM

## 2016-02-15 NOTE — Progress Notes (Signed)
CARDIAC REHAB PHASE I   PRE:  Rate/Rhythm: 66 SR  BP:  Sitting: 142/77        SaO2: 100 RA  MODE:  Ambulation: 700 ft   POST:  Rate/Rhythm: 72 SR  BP:  Sitting: 146/65         SaO2: 100 RA  Pt up ad lib in room, states she walked independently for the first time this morning. Pt agreeable to walk again. Pt ambulated 700 ft on RA, independent, steady gait, tolerated well, no complaints other than mild incisional pain. Pt to edge of bed after walk, call bell within reach. Will follow and attempt to increase distance tomorrow. Encouraged additional ambulation x1 today.   [phone] Joylene Grapes, RN, BSN 02/15/2016 1:56 PM

## 2016-02-16 ENCOUNTER — Other Ambulatory Visit (HOSPITAL_COMMUNITY): Payer: Self-pay

## 2016-02-16 LAB — PROTIME-INR
INR: 3.78 — ABNORMAL HIGH (ref 0.00–1.49)
PROTHROMBIN TIME: 36.4 s — AB (ref 11.6–15.2)

## 2016-02-16 MED ORDER — WARFARIN SODIUM 1 MG PO TABS
1.0000 mg | ORAL_TABLET | Freq: Every day | ORAL | Status: DC
  Administered 2016-02-16: 1 mg via ORAL
  Filled 2016-02-16: qty 1

## 2016-02-16 MED FILL — Heparin Sodium (Porcine) Inj 1000 Unit/ML: INTRAMUSCULAR | Qty: 2500 | Status: AC

## 2016-02-16 NOTE — Progress Notes (Addendum)
      [address]       [place] [address]             [phone]        7 Days Post-Op Procedure(s) (LRB): AORTIC VALVE REPLACEMENT USING A SIZE 23 CARBOMEDICS PROSTHESIS AND RESECTION OF SUBVALVAR AORTIC STENOSIS (N/A) MAZE (N/A) TRANSESOPHAGEAL ECHOCARDIOGRAM (TEE) (N/A) PATENT DUCTUS ARTERIOSUS (PDA) REPAIR (N/A)  Subjective: Patient without specific complaints.  Objective: Vital signs in last 24 hours: Temp:  [97.9 F (36.6 C)-98.2 F (36.8 C)] 98.1 F (36.7 C) (06/08 0443) Pulse Rate:  [65-77] 69 (06/08 0443) Cardiac Rhythm:  [-] Normal sinus rhythm;Bundle branch block (06/07 1901) Resp:  [16-18] 18 (06/08 0443) BP: (122-141)/(69-81) 131/71 mmHg (06/08 0443) SpO2:  [98 %-100 %] 100 % (06/08 0443) Weight:  [89 lb (40.37 kg)] 89 lb (40.37 kg) (06/08 0443)  Pre op weight 41 kg Current Weight  02/16/16 89 lb (40.37 kg)      06/07 0701 - 06/08 0700 In: 123 [P.O.:120; I.V.:3] Out: -    Physical Exam:  Cardiovascular: IRRR, sharp valve click Pulmonary: Mostly clear Abdomen: Soft, non tender, bowel sounds present. Extremities: No lower extremity edema. Wounds: Clean and dry.  No erythema or signs of infection.  Lab Results: CBC: No results for input(s): WBC, HGB, HCT, PLT in the last 72 hours. BMET:  No results for input(s): NA, K, CL, CO2, GLUCOSE, BUN, CREATININE, CALCIUM in the last 72 hours.  PT/INR:  Lab Results  Component Value Date   INR 3.78* 02/16/2016   INR 4.19* 02/15/2016   INR 3.36* 02/14/2016   ABG:  INR: Will add last result for INR, ABG once components are confirmed Will add last 4 CBG results once components are confirmed  Assessment/Plan:  1. CV - Frequent PACs, first degree heart block, HR in the 60's.On Amiodarone 200 mg daily, Lisinopril 20 mg daily, Lopressor 12.5 mg bid, and Coumadin. INR decreased from 4.19 to 3.78. She has a mechanical valve-will give 1 mg tonight 2.  Pulmonary - On room air.  Encourage  incentive spirometer. 3. Acute on chronic CHF-On Lasix 20 mg bid 4.  Acute blood loss anemia - H and H stable at 9.7 and 30.4 5. Mild thrombocytopenia-platelets up to 139,000 6. Home in 1-2 days when INR not supra therapeutic  ZIMMERMAN,DONIELLE MPA-C 02/16/2016,7:50 AM   I have seen and examined the patient and agree with the assessment and plan as outlined.  Tentatively plan d/c home tomorrow.  Resume warfarin 1 mg/day.  Check post op ECHO today.   Purcell Nails, MD 02/16/2016 8:32 AM

## 2016-02-16 NOTE — Hospital Discharge Follow-Up (Signed)
Transitional [facility] at [place]:  This Case Manager met with Sheila Gibbons, RN CM to inform patient known to the [facility]. Has appointment scheduled for 02/21/16 at 1000 with Dr. Jarold Song. Steffanie Dunn indicated patient will receive INR checks at [facility] after discharge.  Met with patient at bedside to reiterate the medical management and case management services provided with the [facility]. Patient reminded of her upcoming appointment. Appointment on AVS. Patient reminded she will receive a post-discharge follow-up phone call 24-48 hours after discharge. Patient appreciative of information. She indicated she will not be allowed to drive after discharge and will need transportation to her upcoming appointment. [facility] notified of patient's transportation needs. Will continue to follow patient's clinical progress closely.

## 2016-02-16 NOTE — Progress Notes (Signed)
CARDIAC REHAB PHASE I   PRE:  Rate/Rhythm: 69 SR  BP:  Sitting: 143/77        SaO2: 100 RA  MODE:  Ambulation: 550 ft   POST:  Rate/Rhythm: 74 SR  BP:  Sitting: 153/75         SaO2: 100 RA  Pt ambulated 550 ft on RA, independent, steady gait, tolerated well, no complaints other than some incisional pain. Pt states she thinks she over did it yesterday. Pt appreciative of walk. To bed after, call bell within reach. Will follow up tomorrow for education.  [phone] Joylene Grapes, RN, BSN 02/16/2016 11:10 AM

## 2016-02-17 ENCOUNTER — Inpatient Hospital Stay (HOSPITAL_COMMUNITY): Payer: Self-pay

## 2016-02-17 DIAGNOSIS — I350 Nonrheumatic aortic (valve) stenosis: Secondary | ICD-10-CM

## 2016-02-17 LAB — PROTIME-INR
INR: 3.2 — AB (ref 0.00–1.49)
Prothrombin Time: 32.1 seconds — ABNORMAL HIGH (ref 11.6–15.2)

## 2016-02-17 LAB — ECHOCARDIOGRAM COMPLETE
AOPV: 0.54 m/s
AV Area VTI index: 1.2 cm2/m2
AV area mean vel ind: 1.15 cm2/m2
AV vel: 1.62
AVA: 1.62 cm2
AVAREAMEANV: 1.55 cm2
AVAREAVTI: 1.71 cm2
AVCELMEANRAT: 0.49
AVG: 11 mmHg
AVPG: 22 mmHg
AVPKVEL: 235 cm/s
CHL CUP AV PEAK INDEX: 1.27
CHL CUP DOP CALC LVOT VTI: 18.8 cm
CHL CUP MV DEC (S): 257
DOP CAL AO MEAN VELOCITY: 156 cm/s
EWDT: 257 ms
FS: 11 % — AB (ref 28–44)
Height: 63 in
IVS/LV PW RATIO, ED: 0.8
LA diam index: 2.44 cm/m2
LA vol: 38.2 mL
LASIZE: 33 mm
LAVOLA4C: 35.7 mL
LAVOLIN: 28.3 mL/m2
LDCA: 3.14 cm2
LEFT ATRIUM END SYS DIAM: 33 mm
LV SIMPSON'S DISK: 24
LV dias vol index: 76 mL/m2
LV dias vol: 103 mL (ref 46–106)
LVOT peak VTI: 0.52 cm
LVOT peak grad rest: 7 mmHg
LVOT peak vel: 128 cm/s
LVOTD: 20 mm
LVOTSV: 59 mL
LVSYSVOL: 78 mL — AB (ref 14–42)
LVSYSVOLIN: 58 mL/m2
MVPKEVEL: 59.1 m/s
P 1/2 time: 738 ms
PW: 11.2 mm — AB (ref 0.6–1.1)
RV TAPSE: 9.56 mm
Stroke v: 25 ml
VTI: 36.4 cm
Valve area index: 1.2
WEIGHTICAEL: 1366.4 [oz_av]

## 2016-02-17 MED ORDER — LISINOPRIL 20 MG PO TABS: 20.0000 mg | ORAL_TABLET | Freq: Every day | ORAL | Status: DC

## 2016-02-17 MED ORDER — ASPIRIN 81 MG PO TBEC: 81.0000 mg | DELAYED_RELEASE_TABLET | Freq: Every day | ORAL | Status: DC

## 2016-02-17 MED ORDER — OXYCODONE-ACETAMINOPHEN 5-325 MG PO TABS: ORAL_TABLET | ORAL | Status: DC

## 2016-02-17 MED ORDER — POTASSIUM CHLORIDE CRYS ER 20 MEQ PO TBCR: 20.0000 meq | EXTENDED_RELEASE_TABLET | Freq: Every day | ORAL | Status: DC

## 2016-02-17 MED ORDER — WARFARIN SODIUM 1 MG PO TABS: 1.0000 mg | ORAL_TABLET | Freq: Every day | ORAL | Status: DC

## 2016-02-17 MED ORDER — AMIODARONE HCL 200 MG PO TABS: 200.0000 mg | ORAL_TABLET | Freq: Every day | ORAL | Status: DC

## 2016-02-17 MED ORDER — METOPROLOL TARTRATE 25 MG PO TABS: 12.5000 mg | ORAL_TABLET | Freq: Two times a day (BID) | ORAL | Status: DC

## 2016-02-17 NOTE — Progress Notes (Addendum)
      [address]       [place] [address]             [phone]        8 Days Post-Op Procedure(s) (LRB): AORTIC VALVE REPLACEMENT USING A SIZE 23 CARBOMEDICS PROSTHESIS AND RESECTION OF SUBVALVAR AORTIC STENOSIS (N/A) MAZE (N/A) TRANSESOPHAGEAL ECHOCARDIOGRAM (TEE) (N/A) PATENT DUCTUS ARTERIOSUS (PDA) REPAIR (N/A)  Subjective: Patient without specific complaints.  Objective: Vital signs in last 24 hours: Temp:  [97.9 F (36.6 C)-98.8 F (37.1 C)] 98.2 F (36.8 C) (06/09 0421) Pulse Rate:  [70-72] 72 (06/09 0421) Cardiac Rhythm:  [-] Normal sinus rhythm;Heart block (06/08 2328) Resp:  [16-18] 16 (06/09 0421) BP: (101-139)/(70-82) 101/70 mmHg (06/09 0421) SpO2:  [98 %-100 %] 98 % (06/09 0421) Weight:  [85 lb 6.4 oz (38.737 kg)] 85 lb 6.4 oz (38.737 kg) (06/09 0421)  Pre op weight 41 kg Current Weight  02/17/16 85 lb 6.4 oz (38.737 kg)      06/08 0701 - 06/09 0700 In: 243 [P.O.:240; I.V.:3] Out: -    Physical Exam:  Cardiovascular: IRRR, sharp valve click Pulmonary: Mostly clear Abdomen: Soft, non tender, bowel sounds present. Extremities: No lower extremity edema. Wounds: Clean and dry.  No erythema or signs of infection.  Lab Results: CBC: No results for input(s): WBC, HGB, HCT, PLT in the last 72 hours. BMET:  No results for input(s): NA, K, CL, CO2, GLUCOSE, BUN, CREATININE, CALCIUM in the last 72 hours.  PT/INR:  Lab Results  Component Value Date   INR 3.20* 02/17/2016   INR 3.78* 02/16/2016   INR 4.19* 02/15/2016   ABG:  INR: Will add last result for INR, ABG once components are confirmed Will add last 4 CBG results once components are confirmed  Assessment/Plan:  1. CV - Frequent PACs, first degree heart block, HR in the 60's.On Amiodarone 200 mg daily, Lisinopril 20 mg daily, Lopressor 12.5 mg bid, and Coumadin. INR decreased from 3.78 to 3.2. She has a mechanical valve. Will continue 1 mg of Coumadin. Echo being taken  this am. 2.  Pulmonary - On room air.  Encourage incentive spirometer. 3.  Acute blood loss anemia - H and H stable at 9.7 and 30.4 4. Mild thrombocytopenia-platelets up to 139,000 5. She is below her pre op weight and no LE edema. She was on 40 mg Lasix daily prior to surgery. Will not continue at this time. 6. Possibly home later today  ZIMMERMAN,DONIELLE MPA-C 02/17/2016,7:53 AM   I have seen and examined the patient and agree with the assessment and plan as outlined.  ECHO looks good.  D/C home on Coumadin 1 mg/day and recheck INR early next week.  Purcell Nails, MD 02/17/2016 9:25 AM

## 2016-02-17 NOTE — Progress Notes (Signed)
Pt was discharged home with family. CCMD was notified and telemetry box was removed. IV was removed with no complications. Pt received discharge instructions and all questions were answered. Pt received prescriptions and was instructed to get them filled after discharge. Pt left the floor via wheelchair and was accompanied by a Ferne Coe and family. Pt was in no distress at time of discharge.   Berdine Dance BSN, RN

## 2016-02-17 NOTE — Progress Notes (Signed)
Pt's IV leaked when flushed and no blood return was noted. IV was removed. Pt tolerated well. Pt may discharge today, so no IV will be placed at this time. If pt does not discharge today another IV will be placed. Pt has no IV medications. Will continue to monitor.   Berdine Dance RN, BSN

## 2016-02-17 NOTE — Progress Notes (Signed)
CARDIAC REHAB PHASE I   Pt ambulated independently this morning, no complaints, dressed and awaiting discharge. Cardiac surgery discharge education completed. Reviewed risk factors, IS, sternal precautions, activity progression, exercise, daily weights, sodium restrictions, heart healthy diet and phase 2 cardiac rehab. Pt verbalized understanding, receptive to information. Pt agrees to phase 2 cardiac rehab, will send to [facility] per pt request.     [phone] Joylene Grapes, RN, BSN 02/17/2016 10:53 AM

## 2016-02-17 NOTE — Progress Notes (Signed)
Echocardiogram 2D Echocardiogram has been performed.  Sheila Branch 02/17/2016, 8:22 AM

## 2016-02-17 NOTE — Care Management Note (Signed)
Case Management Note Donn Pierini RN, BSN Unit 2W-Case Manager [phone]  Patient Details  Name: Sheila Branch MRN: [id number] Date of Birth: 02/07/1971  Subjective/Objective:  Pt s/p AVR                  Action/Plan: PTA pt lived at home- plan to return home- pt has been set up with [facility]- appointment has been placed on AVS - pt will f/u for INR with [facility]. No further CM needs noted.   Expected Discharge Date:    02/17/16              Expected Discharge Plan:  Home/Self Care  In-House Referral:  Clinical Social Work  Discharge planning Services  CM Consult, [facility], Follow-up appt scheduled  Post Acute Care Choice:    Choice offered to:     DME Arranged:    DME Agency:     HH Arranged:    HH Agency:     Status of Service:  Completed, signed off  Medicare Important Message Given:    Date Medicare IM Given:    Medicare IM give by:    Date Additional Medicare IM Given:    Additional Medicare Important Message give by:     If discussed at Long Length of Stay Meetings, dates discussed:    Additional Comments:  Darrold Span, RN 02/17/2016, 12:04 PM

## 2016-02-17 NOTE — Progress Notes (Signed)
Chest tube sutures have been removed and steri strips have been applied to skin. Site is clean and dry. Pt tolerated well. Pt resting in bed with call light within reach. Will continue to monitor.   Berdine Dance BSN, RN

## 2016-02-20 ENCOUNTER — Telehealth: Payer: Self-pay

## 2016-02-20 ENCOUNTER — Ambulatory Visit (INDEPENDENT_AMBULATORY_CARE_PROVIDER_SITE_OTHER): Payer: Self-pay | Admitting: *Deleted

## 2016-02-20 DIAGNOSIS — I359 Nonrheumatic aortic valve disorder, unspecified: Secondary | ICD-10-CM

## 2016-02-20 DIAGNOSIS — Z8774 Personal history of (corrected) congenital malformations of heart and circulatory system: Secondary | ICD-10-CM

## 2016-02-20 DIAGNOSIS — Z954 Presence of other heart-valve replacement: Secondary | ICD-10-CM

## 2016-02-20 DIAGNOSIS — I4892 Unspecified atrial flutter: Secondary | ICD-10-CM

## 2016-02-20 DIAGNOSIS — Z8679 Personal history of other diseases of the circulatory system: Secondary | ICD-10-CM

## 2016-02-20 DIAGNOSIS — I480 Paroxysmal atrial fibrillation: Secondary | ICD-10-CM

## 2016-02-20 DIAGNOSIS — I4891 Unspecified atrial fibrillation: Secondary | ICD-10-CM

## 2016-02-20 DIAGNOSIS — Z9889 Other specified postprocedural states: Secondary | ICD-10-CM

## 2016-02-20 LAB — POCT INR: INR: 5.6

## 2016-02-20 NOTE — Patient Instructions (Signed)

## 2016-02-20 NOTE — Telephone Encounter (Signed)
Transitional Care Clinic Post-discharge Follow-Up Phone Call:  Attempt # 1  Date of Discharge: 02/17/2016 Principal Discharge Diagnosis(es):  Aortic valve replacement Post-discharge Communication:  Call placed to # [phone] (M) and a HIPAA compliant vociemail message was left requesting a call back to # [phone] or [phone].  Call Completed: No

## 2016-02-20 NOTE — Telephone Encounter (Signed)
Transitional Care Clinic Post-discharge Follow-Up Phone Call:  Date of Discharge: 02/17/2016 Principal Discharge Diagnosis(es): aortic valve replacement Post-discharge Communication: Return call received from the patient.   Call Completed: Yes                   With Whom: Patient  )Interpreter Needed: No     Please check all that apply:  X  Patient is knowledgeable of his/her condition(s) and/or treatment.  She stated that she feels " all right."  X  Patient is caring for self at home.  ? Patient is receiving assist at home from family and/or caregiver. Family and/or caregiver is knowledgeable of patient's condition(s) and/or treatment. ? Patient is receiving home health services. If so, name of agency.     Medication Reconciliation:  ? Medication list reviewed with patient. X  Patient obtained all discharge medications. If not, why? She said that she understands her medication and does not need to review the list. She noted that she has all of her medication and is taking everything as ordered. She then stated that she does not have the aspirin. She stated that she has amiodarone and oxycodone  - acetaminophen; but needs to have her new prescriptions filled and she plans to do that today at [facility].    Activities of Daily Living:  X  Independent- She said that she is eating " right" and has access to food. She noted that her daughter is staying with her and can provide any assistance if needed. ? Needs assist (describe; ? home DME used) ? [facility] (describe, ? home DME used)   Community resources in place for patient:  X  None  ? Home Health/Home DME ? Assisted Living ? Support Group          Patient Education: Educated the patient about the importance of complying with having her INR checks as ordered. The patient has an appointment at the coumadin clinic today, 02/20/16. She said that she is aware of the appointment and her friend will be able to provide  transportation to the clinic. She stated that she does not have any bruising or signs/symptoms of bleeding.  Educated about signs and symptoms of infection and reviewed after care for an aortic valve replacement. She said that her incision is healing, no oozing, no sign of infection. She stated that she understands that she is not to rub her incision after showering and she is not to push or pull with her arms to avoid strain on the surgical site. She stated that she is trying not to " over do it." She noted that she lives in a handicap accessible apartment.  She also said that she has a scale and weighs herself every day and keeps a log. She said that her weight this morning was 85.6 lbs. Instructed her to bring her weight log and all of her medications to her appointment tomorrow and she stated that she would.          Questions/Concerns discussed: She confirmed her appointment at the [facility] tomorrow, 02/21/16 @ 1000. She said that she does not have transportation to the clinic tomorrow and was agreeable to cab transportation. Idelle Leech, [facility] scheduler, notified of the patient's need for a cab to her appointment tomorrow. The patient stated that she her address is [address],  [place], [place] [address]. She noted that the [facility] address is her mailing address; not where she is residing.  No other  questions/concerns reported.

## 2016-02-21 ENCOUNTER — Ambulatory Visit: Payer: MEDICAID | Attending: Family Medicine | Admitting: Family Medicine

## 2016-02-21 ENCOUNTER — Encounter: Payer: Self-pay | Admitting: Family Medicine

## 2016-02-21 ENCOUNTER — Encounter: Payer: Self-pay | Admitting: Cardiology

## 2016-02-21 VITALS — BP 108/63 | HR 70 | Temp 98.6°F | Resp 20 | Ht 63.0 in | Wt 87.5 lb

## 2016-02-21 DIAGNOSIS — I480 Paroxysmal atrial fibrillation: Secondary | ICD-10-CM

## 2016-02-21 DIAGNOSIS — Z954 Presence of other heart-valve replacement: Secondary | ICD-10-CM

## 2016-02-21 DIAGNOSIS — I5042 Chronic combined systolic (congestive) and diastolic (congestive) heart failure: Secondary | ICD-10-CM

## 2016-02-21 DIAGNOSIS — Q249 Congenital malformation of heart, unspecified: Secondary | ICD-10-CM

## 2016-02-21 MED ORDER — LISINOPRIL 10 MG PO TABS: 10.0000 mg | ORAL_TABLET | Freq: Every day | ORAL | Status: DC

## 2016-02-21 NOTE — Progress Notes (Signed)
TR[facility]ITIONAL CARE CLINIC  Date of telephone encounter: 02/20/16  Hospitalization dates: 02/09/16 through 02/17/16  PCP: Dr Julien Nordmann  Subjective:  Patient ID: Sheila Branch, female    DOB: 1970/10/18  Age: 45 y.o. 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 During her hospitalization she was commenced on metoprolol; was placed on insulin drip which she was later weaned off; A1c was 6.1.  She remains on Coumadin and is followed by the Coumadin clinic; last INR was 5.6 yesterday for which Coumadin was held for 3 days then she is to resume  on Mondays, Wednesdays and Fridays and 0.5 mg on other days with a repeat INR in 1 week.  She denies chest pains, shortness of breath and has been compliant with her medications; was initially taking  of metoprolol until recently when she discovered she was supposed to be on 12.5mg  bid. Denies hypotensive episodes.  Her appointment with cardiology comes up in 03/06/16 and with cardiothoracic surgery on 03/10/2016.  Outpatient Prescriptions Prior to Visit  Medication Sig Dispense Refill  . amiodarone (PACERONE) 200 MG tablet Take 1 tablet (200 mg total) by mouth daily. 30 tablet 1  . aspirin EC 81 MG EC tablet Take 1 tablet (81 mg total) by mouth daily.    . metoprolol tartrate (LOPRESSOR) 25 MG tablet Take 0.5 tablets (12.5 mg total) by mouth 2 (two) times daily with a meal. 30 tablet 1  . oxyCODONE-acetaminophen (PERCOCET) 5-325 MG tablet Take one or two tablets by mouth every 4-6 hours as needed for moderate to severe  pain. 30 tablet 0  . warfarin (COUMADIN) 1 MG tablet Take 1 tablet (1 mg  total) by mouth daily at 6 PM. Or as directed 30 tablet 1  . lisinopril (PRINIVIL,ZESTRIL) 20 MG tablet Take 1 tablet (20 mg total) by mouth daily. 30 tablet 1   No facility-administered medications prior to visit.    ROS Review of Systems  Constitutional: Negative for activity change, appetite change and fatigue.  HENT: Negative for congestion, sinus pressure and sore throat.   Eyes: Negative for visual disturbance.  Respiratory: Negative for cough, chest tightness, shortness of breath and wheezing.   Cardiovascular: Negative for chest pain and palpitations.  Gastrointestinal: Negative for abdominal pain, constipation and abdominal distention.  Endocrine: Negative for polydipsia.  Genitourinary: Negative for dysuria and frequency.  Musculoskeletal: Negative for back pain and arthralgias.  Skin: Negative for rash.  Neurological: Negative for tremors, light-headedness and numbness.  Hematological: Does not bruise/bleed easily.  Psychiatric/Behavioral: Negative for behavioral problems and agitation.    Objective:  BP 108/63 mmHg  Pulse 70  Temp(Src) 98.6 F (37 C) (Oral)  Resp 20  Ht  (1.6 m)  Wt 87 lb 8 oz (39.69 kg)  BMI 15.50 kg/m2  SpO2 99%  LMP 01/11/2016  BP/Weight 02/21/2016 02/08/2016 02/08/2016  Systolic BP 108 119 130  Diastolic BP 63 42 102  Wt. (Lbs) 87.5 91.3 90.8  BMI 15.5 15.92 15.83      Physical Exam Constitutional: She is oriented to person, place, and time. She appears well-developed and well-nourished.  Cardiovascular: Normal rate and intact distal pulses.   Murmur (2/6 systolic murmur), mechanical valve click heard.  Pulmonary/Chest: vertical sternotomy scar; Effort normal and breath sounds normal. She has no wheezes. She has no rales. She exhibits no tenderness.  Abdominal: Soft. Bowel sounds are normal. She exhibits no distension and no mass. There is no tenderness.  Musculoskeletal: Normal range of motion.  Neurological: She is alert and  oriented to person, place, and time.  Skin: Skin is warm and dry. Psych: normal   Lab Results  Component Value Date   HGBA1C 6.1* 01/12/2016    Assessment & Plan:   1. Chronic combined systolic and diastolic heart failure (HCC) Reduced dose of lisinopril from 20 mg to 10 mg due to soft blood pressure - lisinopril (PRINIVIL,ZESTRIL) 10 MG tablet; Take 1 tablet (10 mg total) by mouth daily.  Dispense: 30 tablet; Refill: 1  2. CHD (congenital heart disease)  3. S/P aortic valve replacement + resection sub-valvar aortic membrane + ligation patent ductus arteriosus + maze procedure Healing well. Remains on Coumadin which is managed by the Coumadin clinic, INR goal of 2.5-3.5 as she is status post mechanical valve replacement Scheduled to see cardiothoracic surgeon on 03/10/16 - lisinopril (PRINIVIL,ZESTRIL) 10 MG tablet; Take 1 tablet (10 mg total) by mouth daily.  Dispense: 30 tablet; Refill: 1  4. Paroxysmal atrial fibrillation (HCC) Currently on amiodarone and metoprolol for rate control  5. Prediabetes A1c 6.1 Dietary modifications to prevent development of diabetes.   Meds ordered this encounter  Medications  . DISCONTD: lisinopril (PRINIVIL,ZESTRIL) 10 MG tablet    Sig: Take 1 tablet (10 mg total) by mouth daily.    Dispense:  30 tablet    Refill:  1  . DISCONTD: lisinopril (PRINIVIL,ZESTRIL) 10 MG tablet    Sig: Take 1 tablet (10 mg total) by mouth daily.    Dispense:  30 tablet    Refill:  1  . lisinopril (PRINIVIL,ZESTRIL) 10 MG tablet    Sig: Take 1 tablet (10 mg total) by mouth daily.    Dispense:  30 tablet    Refill:  1    Follow-up: Return in about 2 weeks (around 03/06/2016) for [facility]CC- follow up of AVR.   Jaclyn Shaggy MD

## 2016-02-21 NOTE — Progress Notes (Signed)
Patient here for hospital follow up.  Her only complaint today is a frontal headache.  She did not take her BP meds because she had been confused about the dose but straightened it out yesterday.

## 2016-02-23 ENCOUNTER — Encounter: Payer: Self-pay | Admitting: Cardiology

## 2016-02-23 NOTE — Telephone Encounter (Signed)
Returned call. Pt noted HR reading of 50, and then 39 when rechecked. This was obtained via automatic home cuff. She explains she doesn't think her cuff was accurate and when she rechecked rate with a phone app, it was in 90s, which she reconfirmed with a manual carotid check. She is not having any symptoms. She has history of A Fib.  I explained that if a patient is in A Fib, even if rate controlled, the automatic BP cuffs often will not get an accurate rate reading. I advised pt on manual HR checks at home (preferably w radial pulse), and to notify us if any symptoms or if her HR runs faster than 120-130.  Pt voiced understanding. This is a Dr. Delton See patient - will route to [facility] Triage for any further considerations.

## 2016-02-23 NOTE — Telephone Encounter (Signed)
Will forward this note taken by our Triage RN Harrold Donath, at our [place] office, to Dr Delton See for further review.

## 2016-02-23 NOTE — Telephone Encounter (Signed)
Patient acknowledged instruction.

## 2016-02-27 ENCOUNTER — Ambulatory Visit (INDEPENDENT_AMBULATORY_CARE_PROVIDER_SITE_OTHER): Payer: Self-pay | Admitting: Pharmacist

## 2016-02-27 DIAGNOSIS — Z954 Presence of other heart-valve replacement: Secondary | ICD-10-CM

## 2016-02-27 DIAGNOSIS — I4892 Unspecified atrial flutter: Secondary | ICD-10-CM

## 2016-02-27 DIAGNOSIS — Z8679 Personal history of other diseases of the circulatory system: Secondary | ICD-10-CM

## 2016-02-27 DIAGNOSIS — I4891 Unspecified atrial fibrillation: Secondary | ICD-10-CM

## 2016-02-27 DIAGNOSIS — Z8774 Personal history of (corrected) congenital malformations of heart and circulatory system: Secondary | ICD-10-CM

## 2016-02-27 DIAGNOSIS — Z9889 Other specified postprocedural states: Secondary | ICD-10-CM

## 2016-02-27 DIAGNOSIS — I480 Paroxysmal atrial fibrillation: Secondary | ICD-10-CM

## 2016-02-27 DIAGNOSIS — I359 Nonrheumatic aortic valve disorder, unspecified: Secondary | ICD-10-CM

## 2016-02-27 LAB — POCT INR: INR: 1.8

## 2016-02-28 ENCOUNTER — Telehealth: Payer: Self-pay

## 2016-02-28 NOTE — Telephone Encounter (Signed)
Call placed to the patient to check on her status and to discuss scheduling a follow up appointment with Dr Venetia Night.  She stated that she is " feeling all right."  She stated that she went to the coumadin clinic yesterday and her INR was 1.8 and she has the schedule for her coumadin dosing on her refrigerator. She explained that she was very appreciative that she has the coumadin dose instructions in writing.  She also explained that she has been checking her BP and pulse at least daily and she has been sending messages to Dr Delton See when she has concerns about her BP or Pulse or questions about her medications.  She noted that she is trying to be very compliant with taking all of her medication as instructed.   She wanted to schedule a follow up appointment with Dr Venetia Night and an appointment was scheduled for 03/08/16 @ 1200.    She reported no other questions/concerns at this time

## 2016-03-06 ENCOUNTER — Ambulatory Visit (INDEPENDENT_AMBULATORY_CARE_PROVIDER_SITE_OTHER): Payer: Self-pay | Admitting: Nurse Practitioner

## 2016-03-06 ENCOUNTER — Encounter: Payer: Self-pay | Admitting: Nurse Practitioner

## 2016-03-06 ENCOUNTER — Ambulatory Visit (INDEPENDENT_AMBULATORY_CARE_PROVIDER_SITE_OTHER): Payer: Self-pay | Admitting: Pharmacist

## 2016-03-06 VITALS — BP 101/62 | HR 67 | Ht 63.0 in | Wt 92.6 lb

## 2016-03-06 DIAGNOSIS — Z952 Presence of prosthetic heart valve: Secondary | ICD-10-CM

## 2016-03-06 DIAGNOSIS — Z954 Presence of other heart-valve replacement: Secondary | ICD-10-CM

## 2016-03-06 DIAGNOSIS — I428 Other cardiomyopathies: Secondary | ICD-10-CM | POA: Insufficient documentation

## 2016-03-06 DIAGNOSIS — I4891 Unspecified atrial fibrillation: Secondary | ICD-10-CM

## 2016-03-06 DIAGNOSIS — Z8774 Personal history of (corrected) congenital malformations of heart and circulatory system: Secondary | ICD-10-CM

## 2016-03-06 DIAGNOSIS — I351 Nonrheumatic aortic (valve) insufficiency: Secondary | ICD-10-CM

## 2016-03-06 DIAGNOSIS — I480 Paroxysmal atrial fibrillation: Secondary | ICD-10-CM

## 2016-03-06 DIAGNOSIS — I2721 Secondary pulmonary arterial hypertension: Secondary | ICD-10-CM | POA: Insufficient documentation

## 2016-03-06 DIAGNOSIS — R9431 Abnormal electrocardiogram [ECG] [EKG]: Secondary | ICD-10-CM | POA: Insufficient documentation

## 2016-03-06 DIAGNOSIS — Z8679 Personal history of other diseases of the circulatory system: Secondary | ICD-10-CM

## 2016-03-06 DIAGNOSIS — I359 Nonrheumatic aortic valve disorder, unspecified: Secondary | ICD-10-CM

## 2016-03-06 DIAGNOSIS — I429 Cardiomyopathy, unspecified: Secondary | ICD-10-CM

## 2016-03-06 DIAGNOSIS — Z9889 Other specified postprocedural states: Secondary | ICD-10-CM

## 2016-03-06 DIAGNOSIS — I4892 Unspecified atrial flutter: Secondary | ICD-10-CM

## 2016-03-06 DIAGNOSIS — Q244 Congenital subaortic stenosis: Secondary | ICD-10-CM

## 2016-03-06 DIAGNOSIS — K766 Portal hypertension: Secondary | ICD-10-CM

## 2016-03-06 LAB — POCT INR: INR: 1.9

## 2016-03-06 NOTE — Patient Instructions (Signed)
Your physician has requested that you have an echocardiogram. Echocardiography is a painless test that uses sound waves to create images of your heart. It provides your doctor with information about the size and shape of your heart and how well your heart's chambers and valves are working. This procedure takes approximately one hour. There are no restrictions for this procedure. This wil be done before July 11 at [address] office.   Your physician recommends that you schedule a follow-up appointment in: 2 months with Dr Delton See.

## 2016-03-06 NOTE — Progress Notes (Signed)
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  Past Medical History    Past Medical History  Diagnosis Date  . Aortic insufficiency     a. 10/2015 Echo: mod-sev AI;  b. 02/09/2016 23 mm Sorin Carbomedics Top Hat bileaflet mechanical valve -->anticoagulated w/ coumadin;  c. 02/17/2016 Echo: EF 25-30%, mod AS, mild AI, mild MR.  [name] aortic stenosis     a. 02/09/2016 s/p resection of subvalvular Ao membrane.  . Chronic combined systolic and diastolic congestive heart failure (HCC)     a. 10/2015 Echo: EF 50-55%;  b. 02/17/2016 Echo (post-op): EF 25-30%, anteroseptal AK.  Marland Kitchen PAF (paroxysmal atrial fibrillation) (HCC) 01/08/2016    a. 10/2015 s/p TEE & DCCV;  b. 12/2015 recurrent AF-->Amio-->converted to sinus.  . CHD (congenital heart disease)     a. PDA s/p ligation 02/09/16.  Marland Kitchen COPD (chronic obstructive pulmonary disease) (HCC)     a. Remote tobacco abuse - quit 10/2015.  Marland Kitchen Anxiety   . History of pneumonia   . S/P aortic valve replacement with metallic valve + resection sub-valvar aortic membrane     a. 02/09/2016 s/p 23 mm Sorin Carbomedics Top Hat bileaflet mechanical valve -->anticoagulated w/ coumadin.  . S/P repair of patent ductus arteriosus 02/09/2016  . NICM (nonischemic cardiomyopathy) (HCC)     a. 10/2015 Echo: EF 50-55%;  b. 01/2016 Cath: nl cors;  c. 02/17/2016 (post-op): EF 25-30%, anteroseptal AK.  Marland Kitchen PAH (pulmonary arterial hypertension) with portal hypertension (HCC)     a. 01/2016 RHC: PA 90/44.  Marland Kitchen Prolonged QT interval     a. Stable @ ~ 508 msec on amio 200mg  daily.   Past Surgical History  Procedure Laterality  Date  . Cardiac catheterization  childhood  . Cesarean section      pt. denies  . 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 Tubal ligation    . Cardioversion N/A 11/09/2015    Procedure: CARDIOVERSION;  Surgeon: Lars Masson, MD;  Location: [facility] ENDOSCOPY;  Service: Cardiovascular;  Laterality: N/A;  . Tee without cardioversion N/A 11/09/2015    Procedure: TRANSESOPHAGEAL ECHOCARDIOGRAM (TEE);  Surgeon: Lars Masson, MD;  Location: [facility] ENDOSCOPY;  Service: Cardiovascular;  Laterality: N/A;  . Cardiac catheterization N/A 01/11/2016    Procedure: Right/Left Heart Cath and Coronary Angiography;  Surgeon: Kathleene Hazel, MD;  Location: [facility] INVASIVE CV LAB;  Service: Cardiovascular;  Laterality: N/A;  . Multiple extractions with alveoloplasty N/A 01/25/2016    Procedure: Extraction of tooth #'s 4,5,16-18,30,31 with alveoloplasty and gross debridement of remaining teeth.;  Surgeon: Charlynne Pander, DDS;  Location: [facility] OR;  Service: Oral Surgery;  Laterality: N/A;  . Mouth surgery Bilateral 01-25-16  . Aortic valve replacement N/A 02/09/2016    Procedure: AORTIC VALVE REPLACEMENT USING A SIZE 23 CARBOMEDICS PROSTHESIS AND RESECTION OF SUBVALVAR AORTIC STENOSIS;  Surgeon: Purcell Nails, MD;  Location: [facility] OR;  Service: Open Heart Surgery;  Laterality: N/A;  . Maze N/A 02/09/2016    Procedure: MAZE;  Surgeon: Purcell Nails, MD;  Location: [facility] OR;  Service: Open Heart Surgery;  Laterality: N/A;  . Tee without cardioversion N/A 02/09/2016    Procedure: TRANSESOPHAGEAL ECHOCARDIOGRAM (TEE);  Surgeon: Purcell Nails, MD;  Location: [facility] OR;  Service: Open Heart Surgery;  Laterality: N/A;  . Patent ductus arterious repair N/A 02/09/2016    Procedure: PATENT DUCTUS ARTERIOSUS (PDA) REPAIR;  Surgeon: Purcell Nails, MD;  Location: [facility] OR;  Service: Open Heart Surgery;  Laterality: N/A;    Allergies  Allergies  Allergen Reactions  . Penicillins Cross Reactors Swelling and  Other (See Comments)    Mouth Ulcers Has patient had a PCN reaction causing immediate rash, facial/tongue/throat swelling, SOB or lightheadedness with hypotension: Yes Has patient had a PCN reaction causing severe rash involving mucus membranes or skin necrosis: Yes Has patient had a PCN reaction that required hospitalization No Has patient had a PCN reaction occurring within the last 10 years: No If all of the above answers are "NO", then may proceed with Cephalosporin use.     History of Present Illness    45 year old female with the above complex past medical history including severe aortic insufficiency status post mechanical aortic valve replacement, subaortic stenosis status post resection of subaortic membrane, patent ductus arteriosus status post resection, paroxysmal atrial fibrillation currently maintained on amiodarone, status post maze procedure, pulmonary hypertension, nonischemic cardiomyopathy. She first began to experience progressive dyspnea in late 2016 and was subsequently admitted in February 2017 with pneumonia and rapid atrial fibrillation. She had normal LV function at that time and require TEE and cardioversion. She was also noted to have severe aortic insufficiency and subvalvular aortic membrane. She was readmitted in late April 2017 with GI illness and diarrhea. In that setting, she had recurrent atrial fibrillation and was placed on amiodarone with subsequent conversion to sinus rhythm. She underwent diagnostic catheterization revealing normal coronary arteries with elevated right heart pressures including a pulmonary artery pressure of 90/44. Cardiac output and index were within normal limits. She was subsequently referred to thoracic surgery and it was felt that she would benefit from mechanical aortic valve replacement with resection of the subaortic membrane. This was ultimately performed on June 1 in addition to ligation of the patent ductus arteriosus and maze procedure.  Hospital course was relatively uncomplicated. She did require IV diuresis for volume overload. Echo performed on June 9, showed an EF of 25-30% which was reduced from previous recordings. Valve was working normally and she was discharged. Since her discharge, she reports having done quite well. She has not required Lasix and has noticed significant improvement in exercise tolerance overall. She has been trying to supplement her diet with boost shakes and is currently weighing 91 pounds, up from 84 pounds postoperatively. She has not had significant incisional chest pain and is not currently taking when necessary narcotics. She is very careful about her sodium intake and has been weighing herself daily. She denies dyspnea, PND, orthopnea, dizziness, syncope, edema, or early satiety.  Home Medications    Prior to Admission medications   Medication Sig Start Date End Date Taking? Authorizing Provider  amiodarone (PACERONE) 200 MG tablet Take 1 tablet (200 mg total) by mouth daily. 02/17/16  Yes Donielle Margaretann Loveless, PA-C  aspirin EC 81 MG EC tablet Take 1 tablet (81 mg total) by mouth daily. 02/17/16  Yes Donielle Margaretann Loveless, PA-C  lisinopril (PRINIVIL,ZESTRIL) 10 MG tablet Take 1 tablet (10 mg total) by mouth daily. 02/21/16  Yes Jaclyn Shaggy, MD  metoprolol tartrate (LOPRESSOR) 25 MG tablet Take 0.5 tablets (12.5 mg total)  by mouth 2 (two) times daily with a meal. 02/17/16  Yes Donielle Margaretann Loveless, PA-C  oxyCODONE-acetaminophen (PERCOCET) 5-325 MG tablet Take one or two tablets by mouth every 4-6 hours as needed for moderate to severe  pain. 02/17/16  Yes Donielle Margaretann Loveless, PA-C  warfarin (COUMADIN) 1 MG tablet Take 1 tablet (1 mg total) by mouth daily at 6 PM. Or as directed 02/17/16  Yes Ardelle Balls, PA-C    Review of Systems    As above, she has been doing quite well.  She denies chest pain, palpitations, dyspnea, pnd, orthopnea, n, v, dizziness, syncope, edema, weight gain, or early satiety.   All other systems reviewed and are otherwise negative except as noted above.  Physical Exam    VS:  BP 101/62 mmHg  Pulse 67  Ht 5\' 3"  (1.6 m)  Wt 92 lb 9.6 oz (42.003 kg)  BMI 16.41 kg/m2 , BMI Body mass index is 16.41 kg/(m^2). GEN: Very thin, well-appearing, in no acute distress. HEENT: normal. Neck: Supple, no JVD, carotid bruits, or masses. Cardiac: Irreg, 2/6 sem RUSB/apex, mech S2, no rubs, or gallops. No clubbing, cyanosis, edema.  Radials/DP/PT 2+ and equal bilaterally. Midsternal surgical incision is healing well without erythema, bleeding, or discharge. Respiratory:  Respirations regular and unlabored, clear to auscultation bilaterally. GI: Soft, nontender, nondistended, BS + x 4. MS: no deformity or atrophy. Skin: warm and dry, no rash. Neuro:  Strength and sensation are intact. Psych: Normal affect.  Accessory Clinical Findings    ECG - Regular sinus rhythm, 60, PACs, LVH with repolarization abnormality, QT 508-stable.  Assessment & Plan    1.  History of severe aortic insufficiency status post mechanical aortic valve replacement: Patient is status post surgery on June 1 and has been doing well since discharge. She isn't quite elated on Coumadin and will have an INR check in our Coumadin clinic today. Surgical sites have been healing well and she has noticed significant improvement in exercise tolerance since surgery. Postoperative echo showed normal aortic valve function and I have reviewed this with Dr. Rennis Golden in clinic today. She has follow-up with thoracic surgery next month.  2. History of subaortic membrane: Status post resection. Doing well as above.  3. Nonischemic cardiomyopathy/chronic combined systolic and diastolic congestive heart failure: Echo done just prior to discharge shows LV dysfunction at 25-30%.  This was newly reduced compared to echo in February.  She had normal coronary arteries on catheterization in May. We discussed the importance of daily weights,  sodium restriction, medication compliance, and symptom reporting and she verbalizes understanding. She has been quite diligent about weighing herself daily and also watching her sodium intake closely. She is aware of signs and symptoms of volume overload as she had to deal with that preoperatively. She is not currently taking Lasix andto contact us if she were to develop increase volume. She remains on beta blocker and ACE inhibitor therapy and is tolerating both. I will arrange for follow-up echocardiogram in approximately 2 months to reevaluate LV function and determine candidacy for ICD if appropriate. Her blood pressure is fairly low at 101/62. I do not think that she would tolerate the addition of spironolactone at this time.  4. Paroxysmal atrial fibrillation: She is in sinus rhythm today. She remains on amiodarone 200 mg daily and is now status post maze procedure. She has stable prolongation of her QTC at 508 ms. Ideally, given age, it would be nice to get her off of amiodarone at some  point. Continue beta blocker. As above, she is anticoagulated with Coumadin.   5. History of patent ductus arteriosus: Status post recent ligation in the setting of surgery as above.  6. Pulmonary arterial hypertension: PA pressure at the time of her heart Was 90/44. PASP was not estimated on June 9 echo. Follow-up echo in 2 months as above.  7. Disposition: Follow-up echo in approximate 2 months to reevaluate LV function on good medical therapy. Follow-up with Dr. Delton See following repeat echo.  Nicolasa Ducking, NP 03/06/2016, 12:16 PM

## 2016-03-08 ENCOUNTER — Encounter: Payer: Self-pay | Admitting: Family Medicine

## 2016-03-08 ENCOUNTER — Ambulatory Visit: Payer: MEDICAID | Attending: Family Medicine | Admitting: Family Medicine

## 2016-03-08 VITALS — BP 93/58 | HR 64 | Temp 98.4°F | Resp 14 | Ht 63.0 in | Wt 93.0 lb

## 2016-03-08 DIAGNOSIS — Q249 Congenital malformation of heart, unspecified: Secondary | ICD-10-CM

## 2016-03-08 DIAGNOSIS — I480 Paroxysmal atrial fibrillation: Secondary | ICD-10-CM

## 2016-03-08 DIAGNOSIS — Z954 Presence of other heart-valve replacement: Secondary | ICD-10-CM

## 2016-03-08 DIAGNOSIS — I5042 Chronic combined systolic (congestive) and diastolic (congestive) heart failure: Secondary | ICD-10-CM

## 2016-03-08 DIAGNOSIS — Z79899 Other long term (current) drug therapy: Secondary | ICD-10-CM | POA: Insufficient documentation

## 2016-03-08 DIAGNOSIS — Q250 Patent ductus arteriosus: Secondary | ICD-10-CM | POA: Insufficient documentation

## 2016-03-08 DIAGNOSIS — Z9889 Other specified postprocedural states: Secondary | ICD-10-CM | POA: Insufficient documentation

## 2016-03-08 DIAGNOSIS — R7303 Prediabetes: Secondary | ICD-10-CM

## 2016-03-08 DIAGNOSIS — M542 Cervicalgia: Secondary | ICD-10-CM

## 2016-03-08 DIAGNOSIS — Z7982 Long term (current) use of aspirin: Secondary | ICD-10-CM | POA: Insufficient documentation

## 2016-03-08 DIAGNOSIS — Z952 Presence of prosthetic heart valve: Secondary | ICD-10-CM | POA: Insufficient documentation

## 2016-03-08 NOTE — Progress Notes (Signed)
TRANSITIONAL CARE CLINIC  Date of telephone encounter: 02/20/16  Hospitalization dates: 02/09/16 through 02/17/16  Subjective:    Patient ID: Sheila Branch, female    DOB: [birth year], 45 y.o.   MRN: [id number]  HPI  She is  a 45 year old female with a history of congenital heart disease,chronic combined systolic and diastolic heart failure (EF 40-45%), A. fib, COPD, aortic insufficiency, aortic stenosis s/p Mechanical aortic valve replacement with resection of subvalvular aortic membrane, ligation of patent ductus arteriosus and maze procedure. Saw cardiology on 03/06/16 and is scheduled to see cardiothoracic surgery on 03/19/16.  She is anticoagulated on Coumadin and is followed by the Coumadin clinic. She denies chest pains, shortness of breath and has been compliant with her medications; lisinopril was reduced from 20 mg to 10 mg at her last office visit Denies hypotensive episodes.  Complains of neck pain worse when she wakes up in the morning and pain is located on bilateral sides of her neck.   Past Medical History  Diagnosis Date  . Aortic insufficiency     a. 10/2015 Echo: mod-sev AI;  b. 02/09/2016 23 mm Sorin Carbomedics Top Hat bileaflet mechanical valve -->anticoagulated w/ coumadin;  c. 02/17/2016 Echo: EF 25-30%, mod AS, mild AI, mild MR.  [name] aortic stenosis     a. 02/09/2016 s/p resection of subvalvular Ao membrane.  . Chronic combined systolic and diastolic congestive heart failure (HCC)     a. 10/2015 Echo: EF 50-55%;  b. 02/17/2016 Echo (post-op): EF 25-30%, anteroseptal AK.  Marland Kitchen PAF (paroxysmal atrial fibrillation) (HCC) 01/08/2016    a. 10/2015 s/p TEE & DCCV;  b. 12/2015 recurrent AF-->Amio-->converted to sinus.  . CHD (congenital heart disease)     a. PDA s/p ligation 02/09/16.  Marland Kitchen COPD (chronic obstructive pulmonary disease) (HCC)     a. Remote tobacco abuse - quit 10/2015.  Marland Kitchen Anxiety   . History of pneumonia   . S/P aortic valve replacement with metallic valve +  resection sub-valvar aortic membrane     a. 02/09/2016 s/p 23 mm Sorin Carbomedics Top Hat bileaflet mechanical valve -->anticoagulated w/ coumadin.  . S/P repair of patent ductus arteriosus 02/09/2016  . NICM (nonischemic cardiomyopathy) (HCC)     a. 10/2015 Echo: EF 50-55%;  b. 01/2016 Cath: nl cors;  c. 02/17/2016 (post-op): EF 25-30%, anteroseptal AK.  Marland Kitchen PAH (pulmonary arterial hypertension) with portal hypertension (HCC)     a. 01/2016 RHC: PA 90/44.  Marland Kitchen Prolonged QT interval     a. Stable @ ~ 508 msec on amio 200mg  daily.    Past Surgical History  Procedure Laterality Date  . Cardiac catheterization  childhood  . Cesarean section      pt. denies  . 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 Tubal ligation    . Cardioversion N/A 11/09/2015    Procedure: CARDIOVERSION;  Surgeon: Lars Masson, MD;  Location: [facility] ENDOSCOPY;  Service: Cardiovascular;  Laterality: N/A;  . Tee without cardioversion N/A 11/09/2015    Procedure: TRANSESOPHAGEAL ECHOCARDIOGRAM (TEE);  Surgeon: Lars Masson, MD;  Location: [facility] ENDOSCOPY;  Service: Cardiovascular;  Laterality: N/A;  . Cardiac catheterization N/A 01/11/2016    Procedure: Right/Left Heart Cath and Coronary Angiography;  Surgeon: Kathleene Hazel, MD;  Location: [facility] INVASIVE CV LAB;  Service: Cardiovascular;  Laterality: N/A;  . Multiple extractions with alveoloplasty N/A 01/25/2016    Procedure: Extraction of tooth #'s 4,5,16-18,30,31 with alveoloplasty and  gross debridement of remaining teeth.;  Surgeon: Charlynne Pander, DDS;  Location: [facility] OR;  Service: Oral Surgery;  Laterality: N/A;  . Mouth surgery Bilateral 01-25-16  . Aortic valve replacement N/A 02/09/2016    Procedure: AORTIC VALVE REPLACEMENT USING A SIZE 23 CARBOMEDICS PROSTHESIS AND RESECTION OF SUBVALVAR AORTIC STENOSIS;  Surgeon: Purcell Nails, MD;  Location: [facility] OR;  Service: Open Heart Surgery;  Laterality: N/A;  . Maze N/A 02/09/2016    Procedure: MAZE;   Surgeon: Purcell Nails, MD;  Location: [facility] OR;  Service: Open Heart Surgery;  Laterality: N/A;  . Tee without cardioversion N/A 02/09/2016    Procedure: TRANSESOPHAGEAL ECHOCARDIOGRAM (TEE);  Surgeon: Purcell Nails, MD;  Location[facility] OR;  Service: Open Heart Surgery;  Laterality: N/A;  . Patent ductus arterious repair N/A 02/09/2016    Procedure: PATENT DUCTUS ARTERIOSUS (PDA) REPAIR;  Surgeon: Purcell Nails, MD;  Location: [facility] OR;  Service: Open Heart Surgery;  Laterality: N/A;    Allergies  Allergen Reactions  . Penicillins Cross Reactors Swelling and Other (See Comments)    Mouth Ulcers Has patient had a PCN reaction causing immediate rash, facial/tongue/throat swelling, SOB or lightheadedness with hypotension: Yes Has patient had a PCN reaction causing severe rash involving mucus membranes or skin necrosis: Yes Has patient had a PCN reaction that required hospitalization No Has patient had a PCN reaction occurring within the last 10 years: No If all of the above answers are "NO", then may proceed with Cephalosporin use.     Current Outpatient Prescriptions on File Prior to Visit  Medication Sig Dispense Refill  . amiodarone (PACERONE) 200 MG tablet Take 1 tablet (200 mg total) by mouth daily. 30 tablet 1  . aspirin EC 81 MG EC tablet Take 1 tablet (81 mg total) by mouth daily.    Marland Kitchen lisinopril (PRINIVIL,ZESTRIL) 10 MG tablet Take 1 tablet (10 mg total) by mouth daily. 30 tablet 1  . metoprolol tartrate (LOPRESSOR) 25 MG tablet Take 0.5 tablets (12.5 mg total) by mouth 2 (two) times daily with a meal. 30 tablet 1  . oxyCODONE-acetaminophen (PERCOCET) 5-325 MG tablet Take one or two tablets by mouth every 4-6 hours as needed for moderate to severe  pain. 30 tablet 0  . warfarin (COUMADIN) 1 MG tablet Take 1 tablet (1 mg total) by mouth daily at 6 PM. Or as directed 30 tablet 1   No current facility-administered medications on file prior to visit.     Review of  Systems Constitutional: Negative for activity change, appetite change and fatigue.  HENT: Negative for congestion, sinus pressure and sore throat.   Eyes: Negative for visual disturbance.  Respiratory: Negative for cough, chest tightness, shortness of breath and wheezing.   Cardiovascular: Negative for chest pain and palpitations.  Gastrointestinal: Negative for abdominal pain, constipation and abdominal distention.  Endocrine: Negative for polydipsia.  Genitourinary: Negative for dysuria and frequency.  Musculoskeletal: Negative for back pain and arthralgias.  positive for neck pain Skin: Negative for rash.  Neurological: Negative for tremors, light-headedness and numbness.  Hematological: Does not bruise/bleed easily.  Psychiatric/Behavioral: Negative for behavioral problems and agitation.    Objective: Filed Vitals:   03/08/16 1202  BP: 93/58  Pulse: 64  Temp: 98.4 F (36.9 C)  TempSrc: Oral  Resp: 14  Height:  (1.6 m)  Weight: 93 lb (42.185 kg)  SpO2: 98%      Physical Exam  Constitutional: She is oriented to person, place, and time.  She appears well-developed and well-nourished.  Cardiovascular: Normal rate and intact distal pulses.   Murmur (2/6 systolic murmur), mechanical valve click heard. Pulmonary/Chest: vertical sternotomy scar; Effort normal and breath sounds normal. She has no wheezes. She has no rales. She exhibits no tenderness.  Abdominal: Soft. Bowel sounds are normal. She exhibits no distension and no mass. There is no tenderness.  Musculoskeletal: Mild tenderness on palpation of bilateral sternocleidomastoid muscles, mild tenderness on range of motion range of motion.  Neurological: She is alert and oriented to person, place, and time.  Skin: Skin is warm and dry. Psych: normal      Assessment & Plan:  1. Chronic combined systolic and diastolic heart failure (HCC) EF 25-30%. Blood pressure on the soft side Continue daily weights, low sodium  diet. Continue Lisinopril10 mg ; will plan to reduce dose at next visit due to soft blood pressure - lisinopril (PRINIVIL,ZESTRIL) 10 MG tablet; Take 1 tablet (10 mg total) by mouth daily.  Dispense: 30 tablet; Refill: 1  2. CHD (congenital heart disease)  3. S/P aortic valve replacement + resection sub-valvar aortic membrane + ligation patent ductus arteriosus + maze procedure Healing well. Remains on Coumadin which is managed by the Coumadin clinic, INR goal of 2.5-3.5 as she is status post mechanical valve replacement Scheduled to see cardiothoracic surgeon on 03/19/16 - lisinopril (PRINIVIL,ZESTRIL) 10 MG tablet; Take 1 tablet (10 mg total) by mouth daily.  Dispense: 30 tablet; Refill: 1  4. Paroxysmal atrial fibrillation (HCC) Currently on amiodarone and metoprolol for rate control  5. Prediabetes A1c 6.1 Dietary modifications to prevent development of diabetes.  6. Neck pain Likely positional  Continue Tylenol Holding off on muscle relaxant due to CHF

## 2016-03-08 NOTE — Progress Notes (Signed)
Pt here for hospital F/U.  Pt also reports stiffness and sharp pain in her neck when she wakes up in the morning. Pt has taken medications today.

## 2016-03-09 ENCOUNTER — Other Ambulatory Visit (HOSPITAL_COMMUNITY): Payer: Self-pay

## 2016-03-12 ENCOUNTER — Ambulatory Visit (INDEPENDENT_AMBULATORY_CARE_PROVIDER_SITE_OTHER): Payer: Self-pay | Admitting: *Deleted

## 2016-03-12 DIAGNOSIS — Z954 Presence of other heart-valve replacement: Secondary | ICD-10-CM

## 2016-03-12 DIAGNOSIS — I4892 Unspecified atrial flutter: Secondary | ICD-10-CM

## 2016-03-12 DIAGNOSIS — I359 Nonrheumatic aortic valve disorder, unspecified: Secondary | ICD-10-CM

## 2016-03-12 DIAGNOSIS — Z8774 Personal history of (corrected) congenital malformations of heart and circulatory system: Secondary | ICD-10-CM

## 2016-03-12 DIAGNOSIS — I4891 Unspecified atrial fibrillation: Secondary | ICD-10-CM

## 2016-03-12 DIAGNOSIS — Z9889 Other specified postprocedural states: Secondary | ICD-10-CM

## 2016-03-12 DIAGNOSIS — I480 Paroxysmal atrial fibrillation: Secondary | ICD-10-CM

## 2016-03-12 DIAGNOSIS — Z8679 Personal history of other diseases of the circulatory system: Secondary | ICD-10-CM

## 2016-03-12 LAB — POCT INR: INR: 3.6

## 2016-03-16 ENCOUNTER — Other Ambulatory Visit: Payer: Self-pay | Admitting: Thoracic Surgery (Cardiothoracic Vascular Surgery)

## 2016-03-16 DIAGNOSIS — Q244 Congenital subaortic stenosis: Secondary | ICD-10-CM

## 2016-03-19 ENCOUNTER — Ambulatory Visit (INDEPENDENT_AMBULATORY_CARE_PROVIDER_SITE_OTHER): Payer: Self-pay | Admitting: Thoracic Surgery (Cardiothoracic Vascular Surgery)

## 2016-03-19 ENCOUNTER — Encounter: Payer: Self-pay | Admitting: Thoracic Surgery (Cardiothoracic Vascular Surgery)

## 2016-03-19 ENCOUNTER — Ambulatory Visit
Admission: RE | Admit: 2016-03-19 | Discharge: 2016-03-19 | Disposition: A | Discharge status: Home / Self Care | Payer: No Typology Code available for payment source | Source: Ambulatory Visit | Attending: Thoracic Surgery (Cardiothoracic Vascular Surgery) | Admitting: Thoracic Surgery (Cardiothoracic Vascular Surgery)

## 2016-03-19 ENCOUNTER — Ambulatory Visit (INDEPENDENT_AMBULATORY_CARE_PROVIDER_SITE_OTHER): Payer: Self-pay | Admitting: Pharmacist

## 2016-03-19 VITALS — BP 104/64 | HR 65 | Resp 20 | Ht 63.0 in | Wt 94.0 lb

## 2016-03-19 DIAGNOSIS — Q244 Congenital subaortic stenosis: Secondary | ICD-10-CM

## 2016-03-19 DIAGNOSIS — I481 Persistent atrial fibrillation: Secondary | ICD-10-CM

## 2016-03-19 DIAGNOSIS — I4892 Unspecified atrial flutter: Secondary | ICD-10-CM

## 2016-03-19 DIAGNOSIS — Z8774 Personal history of (corrected) congenital malformations of heart and circulatory system: Secondary | ICD-10-CM

## 2016-03-19 DIAGNOSIS — I351 Nonrheumatic aortic (valve) insufficiency: Secondary | ICD-10-CM

## 2016-03-19 DIAGNOSIS — J449 Chronic obstructive pulmonary disease, unspecified: Secondary | ICD-10-CM

## 2016-03-19 DIAGNOSIS — Z9889 Other specified postprocedural states: Secondary | ICD-10-CM

## 2016-03-19 DIAGNOSIS — I480 Paroxysmal atrial fibrillation: Secondary | ICD-10-CM

## 2016-03-19 DIAGNOSIS — Z954 Presence of other heart-valve replacement: Secondary | ICD-10-CM

## 2016-03-19 DIAGNOSIS — Q249 Congenital malformation of heart, unspecified: Secondary | ICD-10-CM

## 2016-03-19 DIAGNOSIS — Z952 Presence of prosthetic heart valve: Secondary | ICD-10-CM

## 2016-03-19 DIAGNOSIS — Z8679 Personal history of other diseases of the circulatory system: Secondary | ICD-10-CM

## 2016-03-19 DIAGNOSIS — I359 Nonrheumatic aortic valve disorder, unspecified: Secondary | ICD-10-CM

## 2016-03-19 DIAGNOSIS — I4819 Other persistent atrial fibrillation: Secondary | ICD-10-CM

## 2016-03-19 DIAGNOSIS — I4891 Unspecified atrial fibrillation: Secondary | ICD-10-CM

## 2016-03-19 DIAGNOSIS — I350 Nonrheumatic aortic (valve) stenosis: Secondary | ICD-10-CM

## 2016-03-19 LAB — POCT INR: INR: 2.6

## 2016-03-19 MED ORDER — AMIODARONE HCL 200 MG PO TABS: 100.0000 mg | ORAL_TABLET | Freq: Every day | ORAL | Status: DC

## 2016-03-19 NOTE — Progress Notes (Signed)
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 Her postoperative recovery in the hospital was uneventful and she was discharged home on the eighth postoperative day. Since hospital discharge the patient has continued to do very well. She has had her Coumadin dose monitored closely in the Coumadin clinic. Her INR was checked this morning and was therapeutic at 2.6.  She has been seen in follow-up by Ward Givens at [facility] and her primary care physician.  The patient states that she is doing remarkably well. She already feels much better than she did prior to surgery. She has minimal soreness in her chest in her breathing has been dramatically improved. She states that it has been years since she could lay flat in bed and she now sleeps comfortably in the supine position. She reports no shortness of breath. She has been walking every day and increasing her activity.  Her appetite is good. Her strength is improving. She has no complaints.   Current Outpatient Prescriptions  Medication Sig Dispense Refill  . amiodarone (PACERONE) 200 MG tablet Take 1 tablet (200 mg total) by mouth daily. 30 tablet 1  . aspirin EC 81 MG EC tablet Take 1 tablet (81 mg total) by mouth daily.    Marland Kitchen lisinopril (PRINIVIL,ZESTRIL) 10 MG tablet Take 1 tablet (10 mg total) by mouth daily. 30 tablet 1  . metoprolol tartrate (LOPRESSOR) 25 MG tablet Take 0.5 tablets (12.5 mg total) by mouth 2 (two) times daily with a meal. 30 tablet 1  . oxyCODONE-acetaminophen (PERCOCET) 5-325 MG tablet Take one or two  tablets by mouth every 4-6 hours as needed for moderate to severe  pain. 30 tablet 0  . warfarin (COUMADIN) 1 MG tablet Take 1 tablet (1 mg total) by mouth daily at 6 PM. Or as directed 30 tablet 1   No current facility-administered medications for this visit.      Physical Exam:   BP 104/64 mmHg  Pulse 65  Resp 20  Ht 5\' 3"  (1.6 m)  Wt 94 lb (42.638 kg)  BMI 16.66 kg/m2  SpO2 98%  LMP 12/09/2015 (Approximate)  General:  Well appearing  Chest:   Clear to auscultation  CV:   Regular rate and rhythm with mechanical heart valve sounds  Incisions:  Healing nicely, sternum is stable  Abdomen:  Soft and nontender  Extremities:  Warm and well-perfused  Diagnostic Tests:  2 channel telemetry rhythm strip demonstrates normal sinus rhythm   CHEST 2 VIEW  COMPARISON: Chest x-rays dated 02/13/2016 and 02/12/2016.  FINDINGS: Mild cardiomegaly is stable. Overall cardiomediastinal silhouette is stable in size and configuration. Median sternotomy wires appear intact and stable in alignment. Valve replacement hardware appears stable in position. Lungs are clear.  IMPRESSION: No active cardiopulmonary disease. Lungs are clear.   Electronically Signed  By: Bary Richard M.D.  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  She is maintaining sinus rhythm and overall doing quite well.   Plan:  I have instructed the patient to decrease her dose of amiodarone to 100 mg daily. When her current prescription runs out she will stop taking amiodarone completely. She has been instructed to contact the Coumadin clinic when she stops taking amiodarone.  We have not recommended any other changes to her current medications at this time. I have encouraged the patient to  continue to gradually increase her physical activity as tolerated with her primary limitation remaining that she refrain from heavy lifting or strenuous use of her arms or shoulders for at least another 2 months. I think she may resume driving an automobile. I have encouraged her to consider enrolling in the outpatient cardiac rehabilitation program, but it because of for insurance situation she may not be able to do so. All of her questions have been addressed. The patient will return for routine follow-up and rhythm check in approximately 2 months. She will continue to follow up closely with Dr. Delton See.   Salvatore Decent. Cornelius Moras, MD 03/19/2016 3:10 PM

## 2016-03-19 NOTE — Patient Instructions (Signed)
Decrease amiodarone to 100 mg (1/2 tablet) daily until you run out then stop taking it completely  Notify the Coumadin clinic when you stop taking amiodarone  Continue all other previous medications without any changes at this time  Continue to avoid any heavy lifting or strenuous use of your arms or shoulders for at least a total of three months from the time of surgery.  After three months you may gradually increase how much you lift or otherwise use your arms or chest as tolerated, with limits based upon whether or not activities lead to the return of significant discomfort.  You may return to driving an automobile as long as you are no longer requiring oral narcotic pain relievers during the daytime.  It would be wise to start driving only short distances during the daylight and gradually increase from there as you feel comfortable.

## 2016-03-21 ENCOUNTER — Encounter: Payer: Self-pay | Admitting: Cardiology

## 2016-03-29 ENCOUNTER — Ambulatory Visit (INDEPENDENT_AMBULATORY_CARE_PROVIDER_SITE_OTHER): Payer: Self-pay | Admitting: *Deleted

## 2016-03-29 DIAGNOSIS — I359 Nonrheumatic aortic valve disorder, unspecified: Secondary | ICD-10-CM

## 2016-03-29 DIAGNOSIS — Z9889 Other specified postprocedural states: Secondary | ICD-10-CM

## 2016-03-29 DIAGNOSIS — I4891 Unspecified atrial fibrillation: Secondary | ICD-10-CM

## 2016-03-29 DIAGNOSIS — I4892 Unspecified atrial flutter: Secondary | ICD-10-CM

## 2016-03-29 DIAGNOSIS — Z8679 Personal history of other diseases of the circulatory system: Secondary | ICD-10-CM

## 2016-03-29 DIAGNOSIS — Z8774 Personal history of (corrected) congenital malformations of heart and circulatory system: Secondary | ICD-10-CM

## 2016-03-29 DIAGNOSIS — Z954 Presence of other heart-valve replacement: Secondary | ICD-10-CM

## 2016-03-29 DIAGNOSIS — I480 Paroxysmal atrial fibrillation: Secondary | ICD-10-CM

## 2016-03-29 LAB — POCT INR: INR: 3.4

## 2016-04-02 ENCOUNTER — Telehealth: Payer: Self-pay | Admitting: Cardiology

## 2016-04-02 NOTE — Telephone Encounter (Signed)
Advised patient that this note should come from valve surgeon's office (Dr. Cornelius Moras) being that her surgery was last month. Informed her that Dr. Delton See is out of the office until 8/7.  Explained that if she needs anything further to call us back. Patient is agreeable to calling Cornelius Moras for return to work note.

## 2016-04-02 NOTE — Telephone Encounter (Signed)
Sheila Branch is needing a return to work note saying that she can return with light duty . Please call   Thanks

## 2016-04-06 ENCOUNTER — Encounter: Payer: Self-pay | Admitting: Cardiology

## 2016-04-10 ENCOUNTER — Other Ambulatory Visit: Payer: Self-pay | Admitting: Physician Assistant

## 2016-04-12 ENCOUNTER — Ambulatory Visit (INDEPENDENT_AMBULATORY_CARE_PROVIDER_SITE_OTHER): Payer: Self-pay | Admitting: *Deleted

## 2016-04-12 ENCOUNTER — Encounter: Payer: Self-pay | Admitting: Family Medicine

## 2016-04-12 ENCOUNTER — Ambulatory Visit: Payer: Self-pay | Attending: Family Medicine | Admitting: Family Medicine

## 2016-04-12 VITALS — BP 97/58 | HR 62 | Temp 98.5°F | Ht 63.0 in | Wt 108.2 lb

## 2016-04-12 DIAGNOSIS — I480 Paroxysmal atrial fibrillation: Secondary | ICD-10-CM

## 2016-04-12 DIAGNOSIS — I509 Heart failure, unspecified: Secondary | ICD-10-CM | POA: Insufficient documentation

## 2016-04-12 DIAGNOSIS — Z7982 Long term (current) use of aspirin: Secondary | ICD-10-CM | POA: Insufficient documentation

## 2016-04-12 DIAGNOSIS — Z79899 Other long term (current) drug therapy: Secondary | ICD-10-CM | POA: Insufficient documentation

## 2016-04-12 DIAGNOSIS — Z954 Presence of other heart-valve replacement: Secondary | ICD-10-CM

## 2016-04-12 DIAGNOSIS — I359 Nonrheumatic aortic valve disorder, unspecified: Secondary | ICD-10-CM

## 2016-04-12 DIAGNOSIS — R109 Unspecified abdominal pain: Secondary | ICD-10-CM

## 2016-04-12 DIAGNOSIS — I5042 Chronic combined systolic (congestive) and diastolic (congestive) heart failure: Secondary | ICD-10-CM

## 2016-04-12 DIAGNOSIS — Z8774 Personal history of (corrected) congenital malformations of heart and circulatory system: Secondary | ICD-10-CM

## 2016-04-12 DIAGNOSIS — I4892 Unspecified atrial flutter: Secondary | ICD-10-CM

## 2016-04-12 DIAGNOSIS — I4891 Unspecified atrial fibrillation: Secondary | ICD-10-CM

## 2016-04-12 DIAGNOSIS — Z8679 Personal history of other diseases of the circulatory system: Secondary | ICD-10-CM

## 2016-04-12 DIAGNOSIS — Z9889 Other specified postprocedural states: Secondary | ICD-10-CM

## 2016-04-12 DIAGNOSIS — Q249 Congenital malformation of heart, unspecified: Secondary | ICD-10-CM

## 2016-04-12 LAB — POCT INR: INR: 3.6

## 2016-04-12 MED ORDER — LISINOPRIL 5 MG PO TABS: 10.0000 mg | ORAL_TABLET | Freq: Every day | ORAL | 3 refills | Status: DC

## 2016-04-12 MED ORDER — METOPROLOL TARTRATE 25 MG PO TABS: 12.5000 mg | ORAL_TABLET | Freq: Two times a day (BID) | ORAL | 3 refills | Status: DC

## 2016-04-12 MED ORDER — WARFARIN SODIUM 1 MG PO TABS: 1.0000 mg | ORAL_TABLET | Freq: Every day | ORAL | 1 refills | Status: DC

## 2016-04-12 MED ORDER — TRAMADOL HCL 50 MG PO TABS: 50.0000 mg | ORAL_TABLET | Freq: Three times a day (TID) | ORAL | 0 refills | Status: DC | PRN

## 2016-04-12 NOTE — Patient Instructions (Signed)
Flank Pain °Flank pain refers to pain that is located on the side of the body between the upper abdomen and the back. The pain may occur over a short period of time (acute) or may be long-term or reoccurring (chronic). It may be mild or severe. Flank pain can be caused by many things. °CAUSES  °Some of the more common causes of flank pain include: °· Muscle strains.   °· Muscle spasms.   °· A disease of your spine (vertebral disk disease).   °· A lung infection (pneumonia).   °· Fluid around your lungs (pulmonary edema).   °· A kidney infection.   °· Kidney stones.   °· A very painful skin rash caused by the chickenpox virus (shingles).   °· Gallbladder disease.   °HOME CARE INSTRUCTIONS  °Home care will depend on the cause of your pain. In general, °· Rest as directed by your caregiver. °· Drink enough fluids to keep your urine clear or pale yellow. °· Only take over-the-counter or prescription medicines as directed by your caregiver. Some medicines may help relieve the pain. °· Tell your caregiver about any changes in your pain. °· Follow up with your caregiver as directed. °SEEK IMMEDIATE MEDICAL CARE IF:  °· Your pain is not controlled with medicine.   °· You have new or worsening symptoms. °· Your pain increases.   °· You have abdominal pain.   °· You have shortness of breath.   °· You have persistent nausea or vomiting.   °· You have swelling in your abdomen.   °· You feel faint or pass out.   °· You have blood in your urine. °· You have a fever or persistent symptoms for more than 2-3 days. °· You have a fever and your symptoms suddenly get worse. °MAKE SURE YOU:  °· Understand these instructions. °· Will watch your condition. °· Will get help right away if you are not doing well or get worse. °  °This information is not intended to replace advice given to you by your health care provider. Make sure you discuss any questions you have with your health care provider. °  °Document Released: 10/18/2005 Document  Revised: 05/21/2012 Document Reviewed: 04/10/2012 °Elsevier Interactive Patient Education ©2016 [employer] ° °

## 2016-04-12 NOTE — Progress Notes (Signed)
Subjective:  Patient ID: Sheila Branch, female    DOB: [birth year]  Age: 45 y.o. 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 fib, COPD, aortic insufficiency, aortic stenosis s/p Mechanical aortic valve replacement with resection of subvalvular aortic membrane, ligation of patent ductus arteriosus and maze procedure.  She comes in today complaining of right flank pain worse with deep breaths and denies history of trauma or heavy lifting; She works as a Conservation officer, nature at [employer]. Pain radiates anteriorly so anterior abdominal wall and posteriorly to right parasternal muscles. Denies nausea or vomiting, has no diarrhea or constipation.  Was seen by cardiac surgeon and amiodarone was discontinued but she remains on all of her medications. Has an appointment with the Coumadin clinic later this afternoon and cardiology at the end of the month.  Outpatient Medications Prior to Visit  Medication Sig Dispense Refill  . aspirin EC 81 MG EC tablet Take 1 tablet (81 mg total) by mouth daily.    Marland Kitchen warfarin (COUMADIN) 1 MG tablet Take 1 tablet (1 mg total) by mouth daily at 6 PM. Or as directed 30 tablet 1  . lisinopril (PRINIVIL,ZESTRIL) 10 MG tablet Take 1 tablet (10 mg total) by mouth daily. 30 tablet 1  . metoprolol tartrate (LOPRESSOR) 25 MG tablet Take 0.5 tablets (12.5 mg total) by mouth 2 (two) times daily with a meal. 30 tablet 1  . amiodarone (PACERONE) 200 MG tablet Take 0.5 tablets (100 mg total) by mouth daily. (Patient not taking: Reported on 04/12/2016) 30 tablet 1  . oxyCODONE-acetaminophen (PERCOCET) 5-325 MG tablet Take one or two tablets by mouth every 4-6 hours as needed for moderate to severe  pain. (Patient not taking: Reported on 04/12/2016) 30 tablet 0   No  facility-administered medications prior to visit.     ROS Review of Systems  Constitutional: Negative for activity change, appetite change and fatigue.  HENT: Negative for congestion, sinus pressure and sore throat.   Eyes: Negative for visual disturbance.  Respiratory: Negative for cough, chest tightness, shortness of breath and wheezing.   Cardiovascular: Negative for chest pain and palpitations.  Gastrointestinal: Negative for abdominal distention, abdominal pain and constipation.  Endocrine: Negative for polydipsia.  Genitourinary: Negative for dysuria and frequency.  Musculoskeletal: Negative for arthralgias and back pain.       Right flank pain  Skin: Negative for rash.  Neurological: Negative for tremors, light-headedness and numbness.  Hematological: Does not bruise/bleed easily.  Psychiatric/Behavioral: Negative for agitation and behavioral problems.    Objective:  BP (!) 97/58 (BP Location: Right Arm, Patient Position: Sitting, Cuff Size: Small)   Pulse 62   Temp 98.5 F (36.9 C) (Oral)   Ht  (1.6 m)   Wt 108 lb 3.2 oz (49.1 kg)   LMP 12/09/2015 (Approximate)   SpO2 98%   BMI 19.17 kg/m   BP/Weight 04/12/2016 03/19/2016 03/08/2016  Systolic BP 97 104 93  Diastolic BP 58 64 58  Wt. (Lbs) 108.2 94 93  BMI 19.17 16.66 16.48      Physical Exam Constitutional: She is oriented to person, place, and time. She appears well-developed and well-nourished.  Cardiovascular: Normal rate and intact distal pulses.   Murmur (2/6 systolic murmur), mechanical valve click heard. Pulmonary/Chest: vertical sternotomy scar; Effort normal and breath sounds normal.  She has no wheezes. She has no rales. She exhibits no tenderness.  Abdominal: Soft. Bowel sounds are normal. She exhibits no distension and no mass. Negative Murphy's sign  Musculoskeletal: Tenderness on palpation of right side of her anterior abdominal wall extending to right paraspinal muscles.   Neurological: She is  alert and oriented to person, place, and time.  Skin: Skin is warm and dry. Psych: normal  Assessment & Plan:   1. Flank pain This is suspicious for muscle spasm We'll hold off on muscle relaxant due to cardiac history If symptoms persist will consider right upper quadrant ultrasound to rule out gallbladder etiology - traMADol (ULTRAM) 50 MG tablet; Take 1 tablet (50 mg total) by mouth every 8 (eight) hours as needed.  Dispense: 30 tablet; Refill: 0  2. Chronic combined systolic and diastolic heart failure (HCC) EF 40-45% No evidence of acute failure - lisinopril (PRINIVIL,ZESTRIL) 5 MG tablet; Take 2 tablets (10 mg total) by mouth daily.  Dispense: 30 tablet; Refill: 3  3. CHD (congenital heart disease) Stable Reduced dose of lisinopril from 10 mg to 5 mg due to hypotension - lisinopril (PRINIVIL,ZESTRIL) 5 MG tablet; Take 2 tablets (10 mg total) by mouth daily.  Dispense: 30 tablet; Refill: 3  4. S/P aortic valve replacement + resection sub-valvar aortic membrane + ligation patent ductus arteriosus + maze procedure Keep appointment with cardiac surgeon  5. PAF (paroxysmal atrial fibrillation) (HCC) Rate controlled on metoprolol and anticoagulated on Coumadin - metoprolol tartrate (LOPRESSOR) 25 MG tablet; Take 0.5 tablets (12.5 mg total) by mouth 2 (two) times daily with a meal.  Dispense: 30 tablet; Refill: 3   Meds ordered this encounter  Medications  . metoprolol tartrate (LOPRESSOR) 25 MG tablet    Sig: Take 0.5 tablets (12.5 mg total) by mouth 2 (two) times daily with a meal.    Dispense:  30 tablet    Refill:  3  . traMADol (ULTRAM) 50 MG tablet    Sig: Take 1 tablet (50 mg total) by mouth every 8 (eight) hours as needed.    Dispense:  30 tablet    Refill:  0  . lisinopril (PRINIVIL,ZESTRIL) 5 MG tablet    Sig: Take 2 tablets (10 mg total) by mouth daily.    Dispense:  30 tablet    Refill:  3    Discontinue previous dose    Follow-up: Return in about 2 weeks  (around 04/26/2016) for follow up on right flank pain.Jaclyn Shaggy MD

## 2016-04-12 NOTE — Progress Notes (Signed)
Medication refill

## 2016-04-27 ENCOUNTER — Ambulatory Visit: Payer: Self-pay | Admitting: Family Medicine

## 2016-05-07 ENCOUNTER — Other Ambulatory Visit: Payer: Self-pay

## 2016-05-07 ENCOUNTER — Ambulatory Visit (INDEPENDENT_AMBULATORY_CARE_PROVIDER_SITE_OTHER): Payer: Self-pay | Admitting: Pharmacist

## 2016-05-07 ENCOUNTER — Ambulatory Visit (HOSPITAL_COMMUNITY): Payer: Self-pay | Attending: Cardiology

## 2016-05-07 DIAGNOSIS — Z8774 Personal history of (corrected) congenital malformations of heart and circulatory system: Secondary | ICD-10-CM

## 2016-05-07 DIAGNOSIS — I517 Cardiomegaly: Secondary | ICD-10-CM | POA: Insufficient documentation

## 2016-05-07 DIAGNOSIS — Z9889 Other specified postprocedural states: Secondary | ICD-10-CM

## 2016-05-07 DIAGNOSIS — I429 Cardiomyopathy, unspecified: Secondary | ICD-10-CM | POA: Insufficient documentation

## 2016-05-07 DIAGNOSIS — J449 Chronic obstructive pulmonary disease, unspecified: Secondary | ICD-10-CM | POA: Insufficient documentation

## 2016-05-07 DIAGNOSIS — I480 Paroxysmal atrial fibrillation: Secondary | ICD-10-CM

## 2016-05-07 DIAGNOSIS — I359 Nonrheumatic aortic valve disorder, unspecified: Secondary | ICD-10-CM

## 2016-05-07 DIAGNOSIS — I509 Heart failure, unspecified: Secondary | ICD-10-CM | POA: Insufficient documentation

## 2016-05-07 DIAGNOSIS — I4891 Unspecified atrial fibrillation: Secondary | ICD-10-CM

## 2016-05-07 DIAGNOSIS — Z954 Presence of other heart-valve replacement: Secondary | ICD-10-CM

## 2016-05-07 DIAGNOSIS — I428 Other cardiomyopathies: Secondary | ICD-10-CM

## 2016-05-07 DIAGNOSIS — Z8679 Personal history of other diseases of the circulatory system: Secondary | ICD-10-CM

## 2016-05-07 DIAGNOSIS — Z952 Presence of prosthetic heart valve: Secondary | ICD-10-CM | POA: Insufficient documentation

## 2016-05-07 DIAGNOSIS — I4892 Unspecified atrial flutter: Secondary | ICD-10-CM

## 2016-05-07 LAB — POCT INR: INR: 2.9

## 2016-05-17 ENCOUNTER — Encounter: Payer: Self-pay | Admitting: Cardiology

## 2016-05-21 ENCOUNTER — Encounter: Payer: Self-pay | Admitting: *Deleted

## 2016-05-21 ENCOUNTER — Encounter: Payer: Self-pay | Admitting: Thoracic Surgery (Cardiothoracic Vascular Surgery)

## 2016-05-24 IMAGING — CR DG CHEST 2V
2 series · 2 of 2 positions shown · non-contrast
Comparison: Radiographs 11/03/2015 and 07/25/2011

CLINICAL DATA: Shortness of breath.  Recent pneumonia.

EXAM:
CHEST  2 VIEW

[view not recorded (1 of 2)]
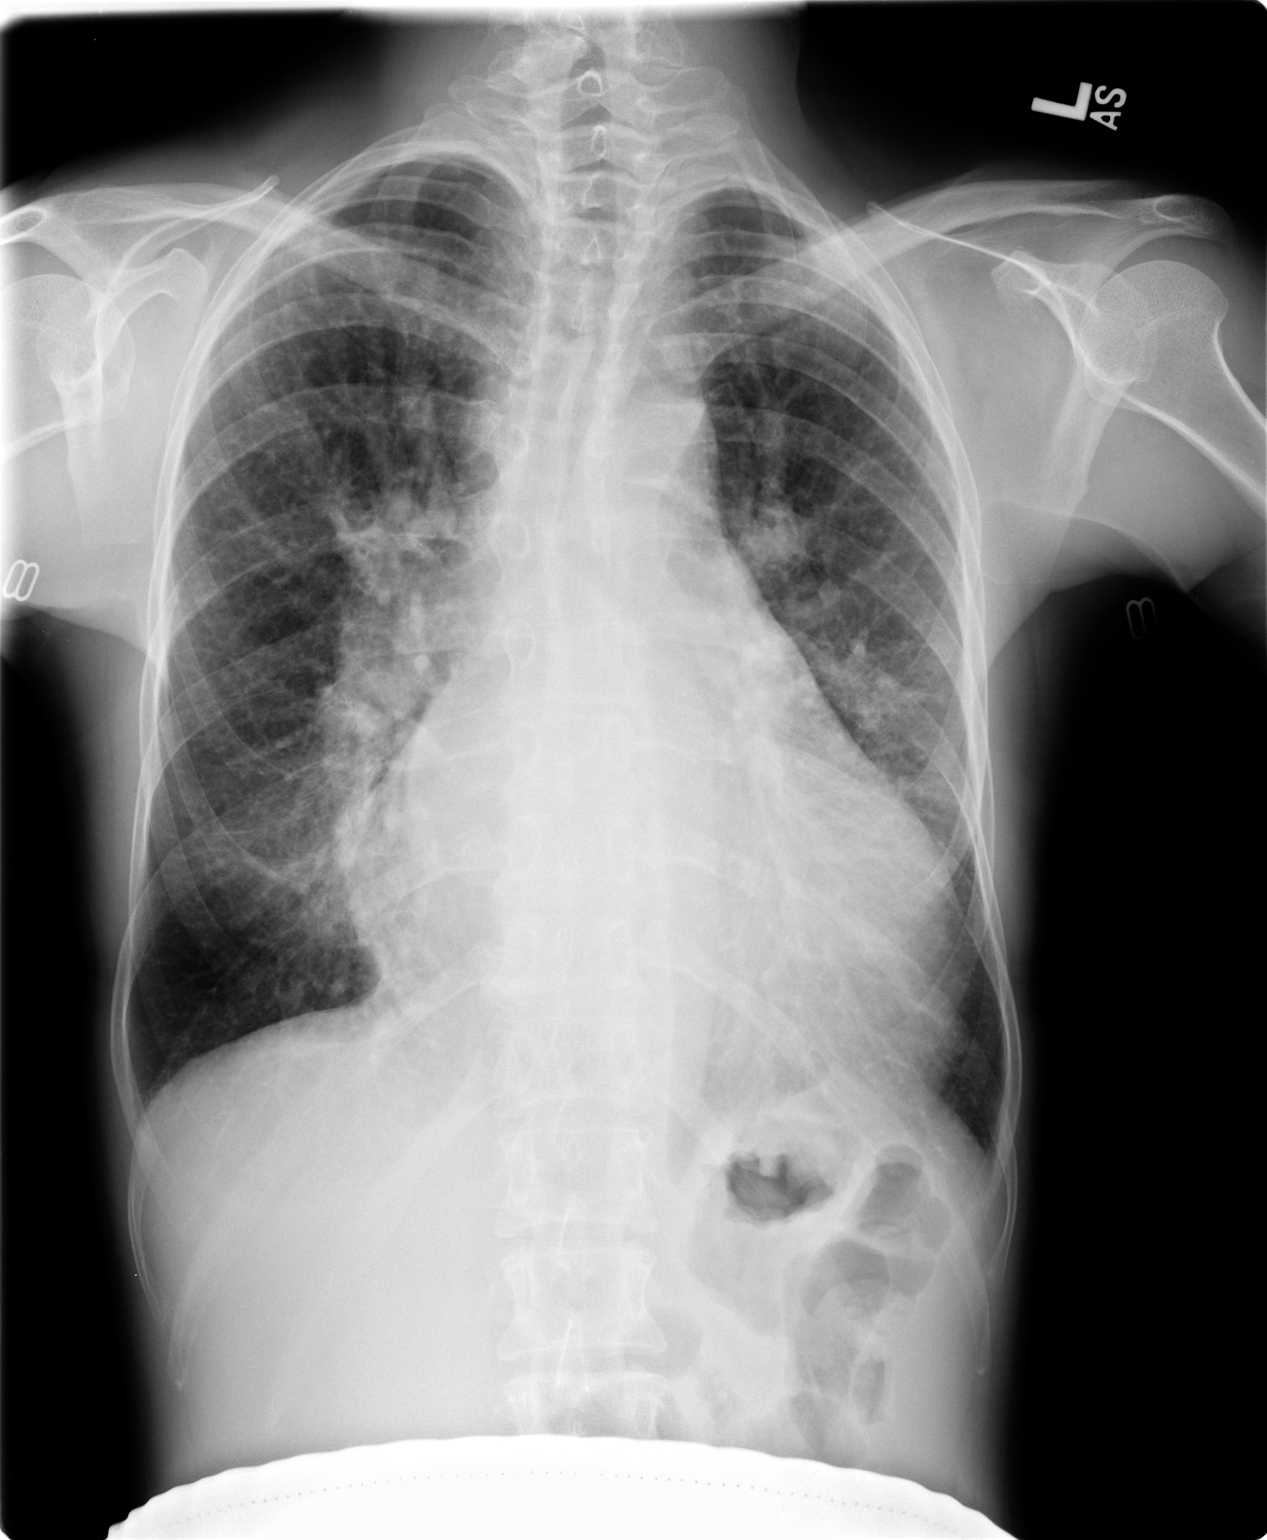

[view not recorded (2 of 2)]
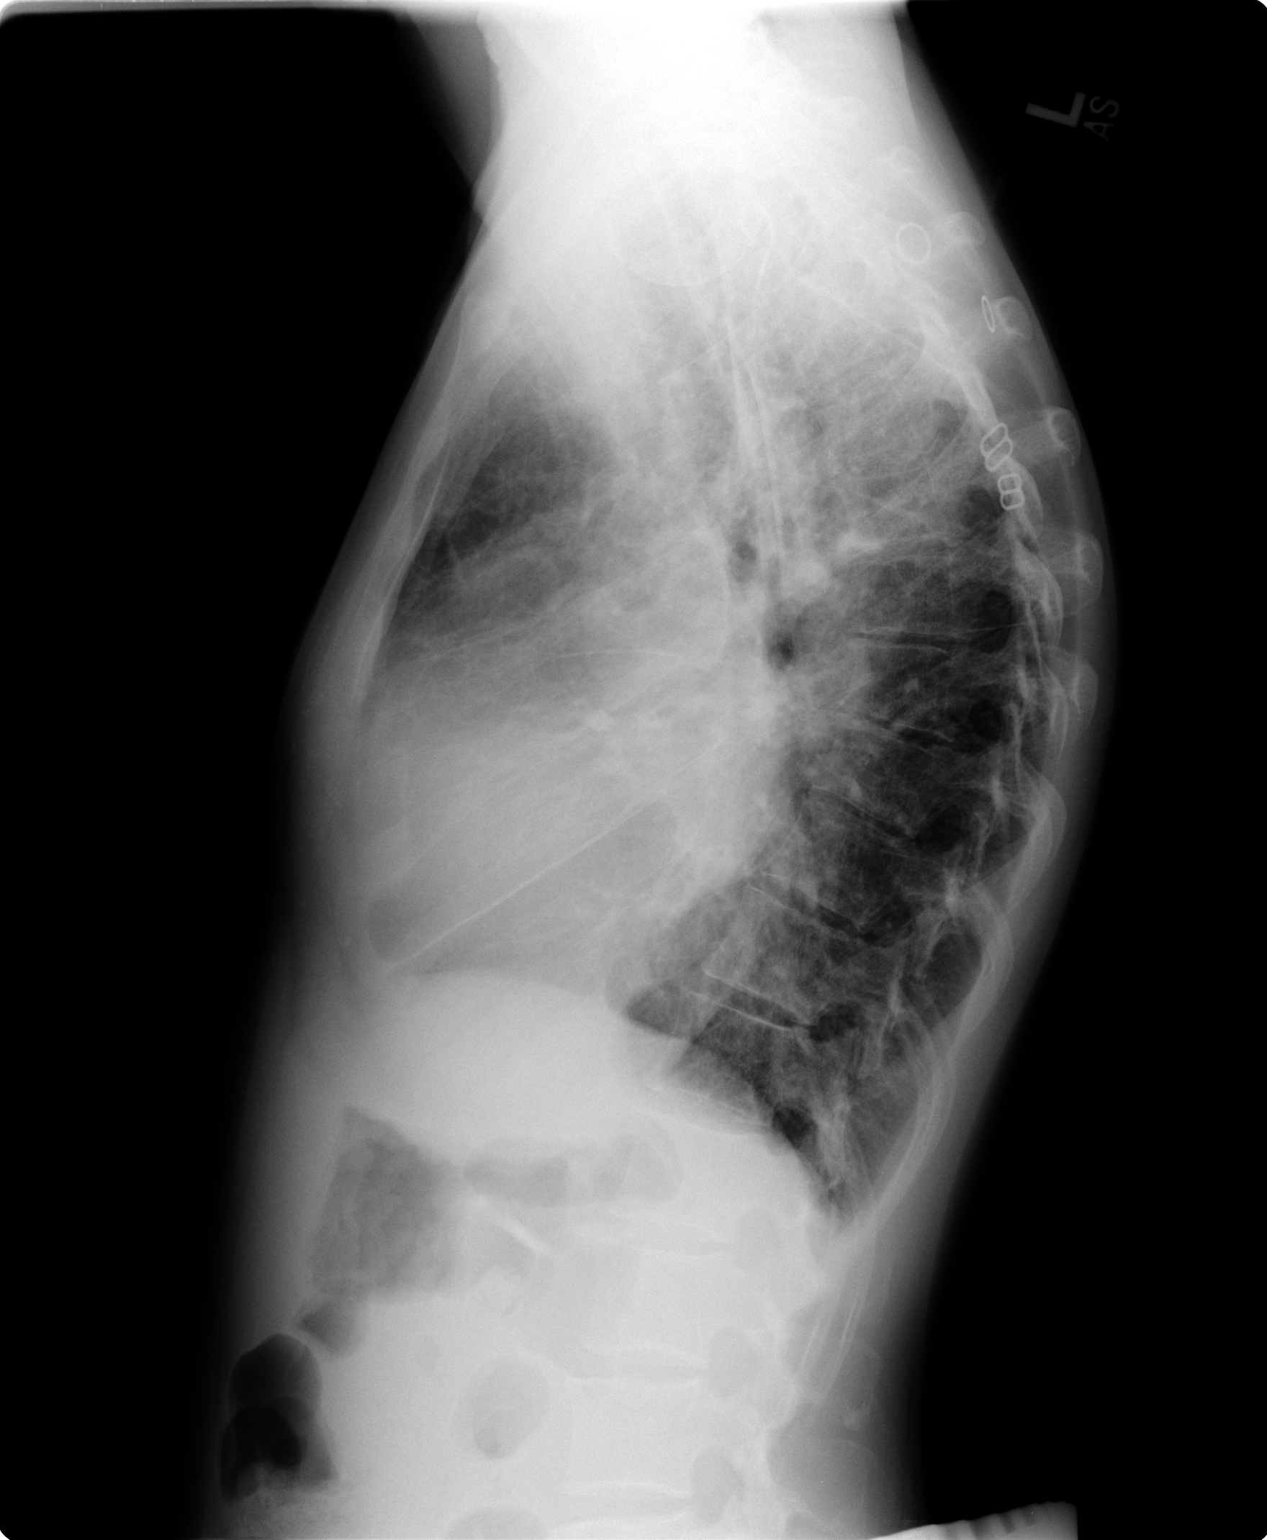

[2 of 2 positions shown; findings below may reference images not displayed]

FINDINGS: Chronic cardiac enlargement. Mild tortuosity of the thoracic aorta.
The pulmonary hila are prominent. There is chronic lung disease.
Findings could be due to sarcoidosis. Recommend clinical
correlation. There is cephalization blood flow which can be seen
with early CHF. No overt pulmonary edema or pleural effusions. The
bony thorax is intact.
IMPRESSION: Chronic cardiac enlargement.

Prominent pulmonary hila and chronic lung disease. Sarcoidosis would
be a possibility. Findings could also be due to chronic heart
disease and chronic CHF.

Cephalization of pulmonary blood flow but no overt pulmonary edema
or pleural effusions.

## 2016-06-02 ENCOUNTER — Encounter: Payer: Self-pay | Admitting: Cardiology

## 2016-06-04 ENCOUNTER — Ambulatory Visit (INDEPENDENT_AMBULATORY_CARE_PROVIDER_SITE_OTHER): Payer: Self-pay | Admitting: Cardiology

## 2016-06-04 ENCOUNTER — Encounter: Payer: Self-pay | Admitting: Thoracic Surgery (Cardiothoracic Vascular Surgery)

## 2016-06-04 ENCOUNTER — Telehealth: Payer: Self-pay | Admitting: *Deleted

## 2016-06-04 ENCOUNTER — Ambulatory Visit (INDEPENDENT_AMBULATORY_CARE_PROVIDER_SITE_OTHER): Payer: Self-pay | Admitting: *Deleted

## 2016-06-04 ENCOUNTER — Ambulatory Visit (INDEPENDENT_AMBULATORY_CARE_PROVIDER_SITE_OTHER): Payer: Self-pay | Admitting: Thoracic Surgery (Cardiothoracic Vascular Surgery)

## 2016-06-04 VITALS — BP 109/70 | HR 60 | Resp 16 | Ht 63.0 in | Wt 108.0 lb

## 2016-06-04 VITALS — BP 110/68 | HR 65 | Ht 63.0 in | Wt 110.0 lb

## 2016-06-04 DIAGNOSIS — Z8774 Personal history of (corrected) congenital malformations of heart and circulatory system: Secondary | ICD-10-CM

## 2016-06-04 DIAGNOSIS — IMO0001 Reserved for inherently not codable concepts without codable children: Secondary | ICD-10-CM

## 2016-06-04 DIAGNOSIS — I4891 Unspecified atrial fibrillation: Secondary | ICD-10-CM

## 2016-06-04 DIAGNOSIS — I480 Paroxysmal atrial fibrillation: Secondary | ICD-10-CM

## 2016-06-04 DIAGNOSIS — I4892 Unspecified atrial flutter: Secondary | ICD-10-CM

## 2016-06-04 DIAGNOSIS — Z8679 Personal history of other diseases of the circulatory system: Secondary | ICD-10-CM

## 2016-06-04 DIAGNOSIS — Z952 Presence of prosthetic heart valve: Secondary | ICD-10-CM

## 2016-06-04 DIAGNOSIS — I359 Nonrheumatic aortic valve disorder, unspecified: Secondary | ICD-10-CM

## 2016-06-04 DIAGNOSIS — I428 Other cardiomyopathies: Secondary | ICD-10-CM

## 2016-06-04 DIAGNOSIS — Z954 Presence of other heart-valve replacement: Secondary | ICD-10-CM

## 2016-06-04 DIAGNOSIS — Z9889 Other specified postprocedural states: Secondary | ICD-10-CM

## 2016-06-04 DIAGNOSIS — I429 Cardiomyopathy, unspecified: Secondary | ICD-10-CM

## 2016-06-04 DIAGNOSIS — J449 Chronic obstructive pulmonary disease, unspecified: Secondary | ICD-10-CM

## 2016-06-04 DIAGNOSIS — Z7901 Long term (current) use of anticoagulants: Secondary | ICD-10-CM

## 2016-06-04 DIAGNOSIS — J441 Chronic obstructive pulmonary disease with (acute) exacerbation: Secondary | ICD-10-CM

## 2016-06-04 LAB — POCT INR: INR: 2.4

## 2016-06-04 MED ORDER — PREDNISONE 20 MG PO TABS: 40.0000 mg | ORAL_TABLET | Freq: Every day | ORAL | 0 refills | Status: DC

## 2016-06-04 MED ORDER — TIOTROPIUM BROMIDE MONOHYDRATE 18 MCG IN CAPS: 18.0000 ug | ORAL_CAPSULE | Freq: Every day | RESPIRATORY_TRACT | 12 refills | Status: DC

## 2016-06-04 MED ORDER — ALBUTEROL SULFATE HFA 108 (90 BASE) MCG/ACT IN AERS: 2.0000 | INHALATION_SPRAY | Freq: Four times a day (QID) | RESPIRATORY_TRACT | 2 refills | Status: DC | PRN

## 2016-06-04 NOTE — Progress Notes (Signed)
Office Visit    Patient Name: Sheila BaconBarbara M Branch Date of Encounter: 06/04/2016  Primary Care Provider:  Jaclyn ShaggyEnobong, Amao, MD Primary Cardiologist:  Eloy EndK. Emoni Yang, MD   Chief Complaint    45 year old female with a history of severe aortic insufficiency, subaortic stenosis, paroxysmal atrial fibrillation, and patent ductus arteriosus status post mechanical aortic valve replacement on 02/09/2016, who presents for follow-up.  Past Medical History    Past Medical History:  Diagnosis Date  . Anxiety   . Aortic insufficiency    a. 10/2015 Echo: mod-sev AI;  b. 02/09/2016 23 mm Sorin [employer] Top Hat bileaflet mechanical valve -->anticoagulated w/ coumadin;  c. 02/17/2016 Echo: EF 25-30%, mod AS, mild AI, mild MR.  . CHD (congenital heart disease)    a. PDA s/p ligation 02/09/16.  Marland Kitchen. Chronic combined systolic and diastolic congestive heart failure (HCC)    a. 10/2015 Echo: EF 50-55%;  b. 02/17/2016 Echo (post-op): EF 25-30%, anteroseptal AK.  Marland Kitchen. COPD (chronic obstructive pulmonary disease) (HCC)    a. Remote tobacco abuse - quit 10/2015.  Marland Kitchen. History of pneumonia   . NICM (nonischemic cardiomyopathy) (HCC)    a. 10/2015 Echo: EF 50-55%;  b. 01/2016 Cath: nl cors;  c. 02/17/2016 (post-op): EF 25-30%, anteroseptal AK.  Marland Kitchen. PAF (paroxysmal atrial fibrillation) (HCC) 01/08/2016   a. 10/2015 s/p TEE & DCCV;  b. 12/2015 recurrent AF-->Amio-->converted to sinus.  Marland Kitchen. PAH (pulmonary arterial hypertension) with portal hypertension (HCC)    a. 01/2016 RHC: PA 90/44.  Marland Kitchen. Prolonged QT interval    a. Stable @ ~ 508 msec on amio 200mg  daily.  . 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 02/09/2016 s/p 23 mm Sorin [employer]omed[employer] Top Hat bileaflet mechanical valve -->anticoagulated w/ coumadin.  . S/P repair of patent ductus arteriosus 02/09/2016  . Subvalvar aortic stenosis    a. 02/09/2016 s/p resection of subvalvular Ao membrane.   Past Surgical History:  Procedure Laterality Date    . AORTIC VALVE REPLACEMENT N/A 02/09/2016   Procedure: AORTIC VALVE REPLACEMENT USING A SIZE 23 CARBOMEDICS PROSTHESIS AND RESECTION OF SUBVALVAR AORTIC STENOSIS;  Surgeon: Purcell Nailslarence H Owen, MD;  Location: [facility] OR;  Service: Open Heart Surgery;  Laterality: N/A;  . CARDIAC CATHETERIZATION  childhood  . CARDIAC CATHETERIZATION N/A 01/11/2016   Procedure: Right/Left Heart Cath and Coronary Angiography;  Surgeon: Kathleene Hazelhristopher D McAlhany, MD;  [facility];  Service: Cardiovascular;  Laterality: N/A;  . CARDIOVERSION N/A 11/09/2015   Procedure: CARDIOVERSION;  Surgeon: Lars MassonKatarina H Hildegard Hlavac, MD;  [facility];  Service: Cardiovascular;  Laterality: N/A;  . CESAREAN SECTION     pt. denies  . MAZE N/A 02/09/2016   Procedure: MAZE;  Surgeon: Purcell Nailslarence H Owen, MD;  [facility];  Service: Open Heart Surgery;  Laterality: N/A;  . MOUTH SURGERY Bilateral 01-25-16  . MULTIPLE EXTRACTIONS WITH ALVEOLOPLASTY N/A 01/25/2016   Procedure: Extraction of tooth #'s 4,5,16-18,30,31 with alveoloplasty and gross debridement of remaining teeth.;  Surgeon: Charlynne Panderonald F Kulinski, DDS;  [facility];  Service: Oral Surgery;  Laterality: N/A;  . PATENT DUCTUS ARTERIOUS REPAIR N/A 02/09/2016   Procedure: PATENT DUCTUS ARTERIOSUS (PDA) REPAIR;  Surgeon: Purcell Nailslarence H Owen, MD;  Location: [facility] OR;  Service: Open Heart Surgery;  Laterality: N/A;  . 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 TEE WITHOUT CARDIOVERSION N/A 11/09/2015   Procedure: TRANSESOPHAGEAL ECHOCARDIOGRAM (TEE);  Surgeon:  Lars Masson, MD;  Location: [facility] ENDOSCOPY;  Service: Cardiovascular;  Laterality: N/A;  . TEE WITHOUT CARDIOVERSION N/A 02/09/2016   Procedure: TRANSESOPHAGEAL ECHOCARDIOGRAM (TEE);  Surgeon: Purcell Nails, MD;  Location: [facility] OR;  Service: Open Heart Surgery;  Laterality: N/A;  . TUBAL LIGATION      Allergies  Allergies  Allergen Reactions  . Penicillins Cross Reactors Swelling and Other (See  Comments)    Mouth Ulcers Has patient had a PCN reaction causing immediate rash, facial/tongue/throat swelling, SOB or lightheadedness with hypotension: Yes Has patient had a PCN reaction causing severe rash involving mucus membranes or skin necrosis: Yes Has patient had a PCN reaction that required hospitalization No Has patient had a PCN reaction occurring within the last 10 years: No If all of the above answers are "NO", then may proceed with Cephalosporin use.     History of Present Illness    45 year old female with the above complex past medical history including severe aortic insufficiency status post mechanical aortic valve replacement, subaortic stenosis status post resection of subaortic membrane, patent ductus arteriosus status post resection, paroxysmal atrial fibrillation currently maintained on amiodarone, status post maze procedure, pulmonary hypertension, nonischemic cardiomyopathy. She first began to experience progressive dyspnea in late 2016 and was subsequently admitted in February 2017 with pneumonia and rapid atrial fibrillation. She had normal LV function at that time and require TEE and cardioversion. She was also noted to have severe aortic insufficiency and subvalvular aortic membrane. She was readmitted in late April 2017 with GI illness and diarrhea. In that setting, she had recurrent atrial fibrillation and was placed on amiodarone with subsequent conversion to sinus rhythm. She underwent diagnostic catheterization revealing normal coronary arteries with elevated right heart pressures including a pulmonary artery pressure of 90/44. Cardiac output and index were within normal limits. She was subsequently referred to thoracic surgery and it was felt that she would benefit from mechanical aortic valve replacement with resection of the subaortic membrane. This was ultimately performed on June 1 in addition to ligation of the patent ductus arteriosus and maze procedure. Hospital  course was relatively uncomplicated. She did require IV diuresis for volume overload. Echo performed on June 9, showed an EF of 25-30% which was reduced from previous recordings. Valve was working normally and she was discharged. Since her discharge, she reports having done quite well. She has not required Lasix and has noticed significant improvement in exercise tolerance overall. She has been trying to supplement her diet with boost shakes and is currently weighing 91 pounds, up from 84 pounds postoperatively. She has not had significant incisional chest pain and is not currently taking when necessary narcotics. She is very careful about her sodium intake and has been weighing herself daily. She denies dyspnea, PND, orthopnea, dizziness, syncope, edema, or early satiety.  06/04/2016 - this is a 3 months follow-up, she states that she feels great able to do most activities of daily living, only in the last couple days she has noticed worsening of shortness of breath, however not lower extremity edema no orthopnea or proximal nocturnal dyspnea. She saw Dr. Cornelius Moras earlier this morning and he noticed wheezing on physical exam. She denies fevers or chills, she has nonproductive cough. She feels congested in her sinuses and has watery eyes. She has history of long-term smoking and quit earlier disease year. She has never been treated for COPD. She denies any palpitations or syncope. She used to have mild chest pain in a sternotomy side but that  has resolved.    Home Medications    Prior to Admission medications   Medication Sig Start Date End Date Taking? Authorizing Provider  amiodarone (PACERONE) 200 MG tablet Take 1 tablet (200 mg total) by mouth daily. 02/17/16  Yes Donielle Margaretann Loveless, PA-C  aspirin EC 81 MG EC tablet Take 1 tablet (81 mg total) by mouth daily. 02/17/16  Yes Donielle Margaretann Loveless, PA-C  lisinopril (PRINIVIL,ZESTRIL) 10 MG tablet Take 1 tablet (10 mg total) by mouth daily. 02/21/16  Yes Jaclyn Shaggy, MD  metoprolol tartrate (LOPRESSOR) 25 MG tablet Take 0.5 tablets (12.5 mg total) by mouth 2 (two) times daily with a meal. 02/17/16  Yes Donielle Margaretann Loveless, PA-C  oxyCODONE-acetaminophen (PERCOCET) 5-325 MG tablet Take one or two tablets by mouth every 4-6 hours as needed for moderate to severe  pain. 02/17/16  Yes Donielle Margaretann Loveless, PA-C  warfarin (COUMADIN) 1 MG tablet Take 1 tablet (1 mg total) by mouth daily at 6 PM. Or as directed 02/17/16  Yes Ardelle Balls, PA-C    Review of Systems    As above, she has been doing quite well.  She denies chest pain, palpitations, dyspnea, pnd, orthopnea, n, v, dizziness, syncope, edema, weight gain, or early satiety.  All other systems reviewed and are otherwise negative except as noted above.  Physical Exam    VS:  BP 110/68   Pulse 65   Ht 5\' 3"  (1.6 m)   Wt 110 lb (49.9 kg)   BMI 19.49 kg/m  , BMI Body mass index is 19.49 kg/m. GEN: Very thin, well-appearing, in no acute distress.  HEENT: normal.  Neck: Supple, no JVD, carotid bruits, or masses. Cardiac: RR, mech S2, no rubs, or gallops. No clubbing, cyanosis, edema.  Radials/DP/PT 2+ and equal bilaterally. Midsternal surgical incision is healing well without erythema, bleeding, or discharge. Respiratory:  Respirations regular and unlabored, wheezing bilaterally. GI: Soft, nontender, nondistended, BS + x 4. MS: no deformity or atrophy. Skin: warm and dry, no rash. Neuro:  Strength and sensation are intact. Psych: Normal affect.  Accessory Clinical Findings    ECG - Regular sinus rhythm, 60, PACs, LVH with repolarization abnormality, QT 508-stable.   Assessment & Plan    1.  History of severe aortic insufficiency status post mechanical AVR: Patient is status post surgery on June 1 and has been doing well since discharge. No bleeding with Coumadin. Transaortic gradients on the echo in August 2017 were normal with mean gradient 12 mmHg.  2. History of subaortic membrane:  Status post resection. Doing well as above.  3. Nonischemic cardiomyopathy/chronic combined systolic and diastolic congestive heart failure: Echo done just prior to discharge showed LV dysfunction at 25-30%, however repeat LVEF on echo in August 2017 showed improved LVEF of 50-55%. Continue metoprolol and lisinopril.   5. History of patent ductus arteriosus: Status post recent ligation in the setting of surgery as above.  6. Pulmonary arterial hypertension: PA pressure at the time of her heart Was 90/44. RVSP on the repeat echocardiogram in August 2017 was completely normal.  7. Acute COPD exacerbation, patient is awaiting pulmonary fast function testing, she is actively wheezing in the clinic today, I will start prednisone 40 mg daily for 3 days, also prescribe her a 18 g 2 puffs daily, and albuterol inhaler as needed. She is to follow with her primary care physician. She was also advised to use Claritin for allergic sinusitis and conjunctivitis.  Tobias Alexander, NP 06/04/2016, 2:05 PM

## 2016-06-04 NOTE — Telephone Encounter (Signed)
Will route this message to Selena Batten in medical records to follow-up and provide this report to the pt.

## 2016-06-04 NOTE — Patient Instructions (Addendum)
You may resume unrestricted physical activity without any particular limitations at this time.  Endocarditis is a potentially serious infection of heart valves or inside lining of the heart.  It occurs more commonly in patients with diseased heart valves (such as patient's with aortic or mitral valve disease) and in patients who have undergone heart valve repair or replacement.  Certain surgical and dental procedures may put you at risk, such as dental cleaning, other dental procedures, or any surgery involving the respiratory, urinary, gastrointestinal tract, gallbladder or prostate gland.   To minimize your chances for develooping endocarditis, maintain good oral health and seek prompt medical attention for any infections involving the mouth, teeth, gums, skin or urinary tract.    Always notify your doctor or dentist about your underlying heart valve condition before having any invasive procedures. You will need to take antibiotics before certain procedures, including all routine dental cleanings or other dental procedures.  Your cardiologist or dentist should prescribe these antibiotics for you to be taken ahead of time.      

## 2016-06-04 NOTE — Telephone Encounter (Signed)
-----   Message from Lars Masson, MD sent at 06/01/2016  9:23 PM EDT ----- Her CXR or CT doesn't show it, but its on her PFT report, print that for her, you can find it in results under "pulmonology"   ----- Message ----- From: Loa Socks, LPN Sent: 05/14/91   8:52 AM To: Lars Masson, MD    ----- Message ----- From: Leone Brand, NP Sent: 06/01/2016   8:50 AM To: Loa Socks, LPN  We can send copy of CXR that notes COPD. But Dr. Delton See is your cardiologist and you should direct to her. If she agrees we can write the letter. Thanks.   But her primary MD notes the COPD in her chart, she should contact them as well.   Previous Messages    ----- Message -----   From: Vinetta Bergamo. [place]   Sent: 05/17/2016 8:09 AM EDT    To: Nada Boozer, NP  Subject: Non-Urgent Medical Question   [name], wondering and hoping you can help me. One of the things I was diagnosed with is COPD and I need a letter from a Dr stating that I have COPD and that it may inhibit my breathing, especially deep breaths like for a breath test. If you can contact me and help me with this it will lessen alot of stress and save me alot of money that I don't have! Thank you, [name]  [phone]

## 2016-06-04 NOTE — Progress Notes (Signed)
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 Her postoperative recovery was uneventful and she was last seen here in our office on 03/19/2016. Since then she underwent a routine follow-up echocardiogram that revealed normal functioning mechanical valve in the aortic position with improved left ventricular systolic function. She returns to our office for routine follow-up today. She did not participate in the outpatient cardiac rehabilitation program because she does not have any health insurance. She has been increasing her activity and she states that she walks at least 3 or 4 times a week with a friend of hers. She no longer has any soreness in her chest and she states that her exercise tolerance has improved. Overall she feels much better than she did prior to surgery. She has not had problems with Coumadin therapy. Over the last few days she has developed nasal congestion and cough which she believes is related to seasonal allergies. She has not had fevers or chills.    Current Outpatient Prescriptions  Medication Sig Dispense Refill  . aspirin EC 81 MG EC tablet Take 1 tablet (81 mg total) by mouth daily.    Marland Kitchen lisinopril (PRINIVIL,ZESTRIL) 5 MG tablet Take 2 tablets (10 mg total) by mouth daily. 30 tablet 3  . metoprolol tartrate (LOPRESSOR) 25 MG tablet Take 0.5 tablets (12.5 mg total) by mouth 2 (two) times daily with a meal. 30 tablet 3  . traMADol (ULTRAM) 50 MG tablet Take 1 tablet (50 mg total) by mouth every 8 (eight) hours as  needed. 30 tablet 0  . warfarin (COUMADIN) 1 MG tablet Take 1 tablet (1 mg total) by mouth daily at 6 PM. Or as directed 30 tablet 1   No current facility-administered medications for this visit.       Physical Exam:   BP 109/70   Pulse 60   Resp 16   Ht 5\' 3"  (1.6 m)   Wt 108 lb (49 kg)   SpO2 98% Comment: ON RA  BMI 19.13 kg/m   General:  Well-appearing  Chest:   Clear to auscultation  CV:   Regular rate and rhythm with mechanical heart valve sounds  Incisions:  Completely healed, sternum is stable  Abdomen:  Soft and nontender  Extremities:  Warm and well-perfused  Diagnostic Tests:  2 channel telemetry rhythm strip performed in our office today demonstrates normal sinus rhythm   Transthoracic Echocardiography  Patient:    [place], [place] MR #:       [id number] Study Date: 05/07/2016 Gender:     F Age:        45 Height:     160 cm Weight:     49.1 kg BSA:        1.47 m^2 Pt. Status: Room:   ORDERING     Nicolasa Ducking, MD  REFERRING    Nicolasa Ducking, MD  ATTENDING    Donato Schultz, M.D.  SONOGRAPHER  Aida Raider, RDCS  PERFORMING   [facility] Outpatient  cc:  ------------------------------------------------------------------- LV EF: 50% -   55%  ------------------------------------------------------------------- Indications:      I42.9 Cardiomyopathy.  I35.9 Aortic Valve Disorder.  ------------------------------------------------------------------- History:   PMH:  Acquired from the patient and from the patient&'s chart.  PMH:  History of severe aortic insufficiency, subaortic stenosis, paroxysmal atrial fibrillation. Combined CHF. COPD. Nonischemic cardiomyopathy.  ------------------------------------------------------------------- Study Conclusions  - Left ventricle: The cavity size was normal. Wall thickness was   normal. Systolic function was normal. The estimated ejection   fraction was in the range of 50% to 55%. Wall motion  was normal;   there were no regional wall motion abnormalities. Features are   consistent with a pseudonormal left ventricular filling pattern,   with concomitant abnormal relaxation and increased filling   pressure (grade 2 diastolic dysfunction). - Aortic valve: A mechanical prosthesis was present and functioning   normally. Peak velocity (S): 244 cm/s. - Left atrium: The atrium was mildly dilated. Anterior-posterior   dimension: 43 mm. Volume/bsa, ES, (1-plane Simpson&'s, A2C): 32.6   ml/m^2.  Impressions:  - When compared to prior, EF is now normal.  ------------------------------------------------------------------- Labs, prior tests, procedures, and surgery: Status post Aortic Valve Replacement-7723mm [employer] Top Hat Bileaflet Mechanical Valve.  ------------------------------------------------------------------- Study data:   Study status:  Routine.  Procedure:  The patient reported no pain pre or post test. Transthoracic echocardiography for left ventricular function evaluation, for right ventricular function evaluation, and for assessment of valvular function. Image quality was adequate.  Study completion:  There were no complications.          Transthoracic echocardiography.  M-mode, complete 2D, spectral Doppler, and color Doppler.  Birthdate: Patient birthdate: 05-26-1971.  Age:  Patient is 45 yr old.  Sex: Gender: female.    BMI: 19.2 kg/m^2.  Blood pressure:     101/62 Patient status:  Outpatient.  Study date:  Study date: 05/07/2016. Study time: 10:41 AM.  Location:  [place] [facility]  -------------------------------------------------------------------  ------------------------------------------------------------------- Left ventricle:  The cavity size was normal. Wall thickness was normal. Systolic function was normal. The estimated ejection fraction was in the range of 50% to 55%. Wall motion was normal; there were no regional wall motion  abnormalities. Features are consistent with a pseudonormal left ventricular filling pattern, with concomitant abnormal relaxation and increased filling pressure (grade 2 diastolic dysfunction).  ------------------------------------------------------------------- Aortic valve:  A mechanical prosthesis was present and functioning normally. Mobility was not restricted.  Doppler:  Transvalvular velocity was within the normal range. There was no stenosis. There was no regurgitation.    VTI ratio of LVOT to aortic valve: 0.5. Peak velocity ratio of LVOT to aortic valve: 0.44. Mean velocity ratio of LVOT to aortic valve: 0.48.    Mean gradient (S): 12 mm Hg. Peak gradient (S): 24 mm Hg.  ------------------------------------------------------------------- Aorta:  Aortic root: The aortic root was normal in size.  ------------------------------------------------------------------- Mitral valve:   Mildly thickened leaflets . Mobility was not restricted.  Doppler:  Transvalvular velocity was within the normal range. There was no evidence for stenosis. There was no regurgitation.    Peak gradient (D): 4 mm Hg.  ------------------------------------------------------------------- Left atrium:  The atrium was mildly dilated.  ------------------------------------------------------------------- Right ventricle:  The cavity size was normal. Wall thickness was normal. Systolic function was normal.  ------------------------------------------------------------------- Pulmonic valve:   Poorly visualized.  Structurally normal valve. Cusp separation was normal.  Doppler:  Transvalvular velocity was within the normal range. There was no evidence for stenosis. There was no  regurgitation.  ------------------------------------------------------------------- Tricuspid valve:   Structurally normal valve.    Doppler: Transvalvular velocity was within the normal range. There was  no regurgitation.  ------------------------------------------------------------------- Pulmonary artery:   The main pulmonary artery was normal-sized. Systolic pressure was within the normal range.  ------------------------------------------------------------------- Right atrium:  The atrium was normal in size.  ------------------------------------------------------------------- Pericardium:  There was no pericardial effusion.  ------------------------------------------------------------------- Systemic veins: Inferior vena cava: The vessel was normal in size.  ------------------------------------------------------------------- Measurements   Left ventricle                         Value        Reference  LV ID, ED, PLAX chordal                44    mm     43 - 52  LV ID, ES, PLAX chordal                30    mm     23 - 38  LV fx shortening, PLAX chordal         32    %      >=29  LV PW thickness, ED                    9.53  mm     ---------  IVS/LV PW ratio, ED                    0.99         <=1.3  LV e&', lateral                         9.76  cm/s   ---------  LV E/e&', lateral                       10.76        ---------  LV e&', medial                          7.02  cm/s   ---------  LV E/e&', medial                        14.96        ---------  LV e&', average                         8.39  cm/s   ---------  LV E/e&', average                       12.51        ---------    Ventricular septum                     Value        Reference  IVS thickness, ED                      9.48  mm     ---------    LVOT                                   Value        Reference  LVOT  peak velocity, S                  107   cm/s   ---------  LVOT mean velocity, S                  78    cm/s   ---------  LVOT VTI, S                            23.1  cm     ---------  LVOT peak gradient, S                  5     mm Hg  ---------    Aortic valve                           Value         Reference  Aortic valve peak velocity, S          244   cm/s   ---------  Aortic valve mean velocity, S          162   cm/s   ---------  Aortic valve VTI, S                    46.2  cm     ---------  Aortic mean gradient, S                12    mm Hg  ---------  Aortic peak gradient, S                24    mm Hg  ---------  VTI ratio, LVOT/AV                     0.5          ---------  Velocity ratio, peak, LVOT/AV          0.44         ---------  Velocity ratio, mean, LVOT/AV          0.48         ---------    Aorta                                  Value        Reference  Aortic root ID, ED                     32    mm     ---------    Left atrium                            Value        Reference  LA ID, A-P, ES                         43    mm     ---------  LA ID/bsa, A-P                 (H)     2.92  cm/m^2 <=2.2  LA volume, S  50    ml     ---------  LA volume/bsa, S                       34    ml/m^2 ---------  LA volume, ES, 1-p A4C                 51    ml     ---------  LA volume/bsa, ES, 1-p A4C             34.6  ml/m^2 ---------  LA volume, ES, 1-p A2C                 48    ml     ---------  LA volume/bsa, ES, 1-p A2C             32.6  ml/m^2 ---------    Mitral valve                           Value        Reference  Mitral E-wave peak velocity            105   cm/s   ---------  Mitral A-wave peak velocity            38.9  cm/s   ---------  Mitral deceleration time               162   ms     150 - 230  Mitral peak gradient, D                4     mm Hg  ---------  Mitral E/A ratio, peak                 2.7          ---------    Right ventricle                        Value        Reference  RV s&', lateral, S                      11    cm/s   ---------  Legend: (L)  and  (H)  mark values outside specified reference range.  ------------------------------------------------------------------- Prepared and Electronically Authenticated by  Donato Schultz, M.D. 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  Plan:  I have encouraged the patient to continue to gradually increase her physical activity without any particular limitations. Now that she is therapeutic on warfarin I do not feel like she needs to remain on aspirin therapy. We have otherwise not recommended any changes to her current medications.  The patient has been reminded regarding the importance of dental hygiene and the lifelong need for antibiotic prophylaxis for all dental cleanings and other related invasive procedures.  She will continue to follow-up with Dr. Delton See. She will be referred to the atrial fibrillation clinic for surveillance following Maze procedure. She will return to our office next June, approximately 1 year following her original surgery for routine follow-up and rhythm check.  I spent in excess of 15 minutes during the conduct of this office consultation and >50% of this time  involved direct face-to-face encounter with the patient for counseling and/or coordination of their care.   Salvatore Decent. Cornelius Moras, MD 06/04/2016 10:54 AM

## 2016-06-04 NOTE — Patient Instructions (Signed)
Medication Instructions:   START PREDNISONE 40 MG PO DAILY FOR 3 DAYS ONLY  START USING SPIRIVA 18 mcg (1 CAPSULE) INHALE DAILY  DR NELSON HAS PRESCRIBED FOR YOU TO USE ALBUTEROL INHALER--INHALE 2 PUFFS INTO THE LUNGS EVERY 6 HOURS AS NEEDED FOR SOB AND WHEEZING    Follow-Up:  3 MONTHS WITH DR Delton See       If you need a refill on your cardiac medications before your next appointment, please call your pharmacy.

## 2016-06-05 NOTE — Telephone Encounter (Signed)
LVM for patient to return my call 

## 2016-06-08 ENCOUNTER — Ambulatory Visit (INDEPENDENT_AMBULATORY_CARE_PROVIDER_SITE_OTHER): Payer: Self-pay | Admitting: Pharmacist

## 2016-06-08 DIAGNOSIS — I4892 Unspecified atrial flutter: Secondary | ICD-10-CM

## 2016-06-08 DIAGNOSIS — I4891 Unspecified atrial fibrillation: Secondary | ICD-10-CM

## 2016-06-08 DIAGNOSIS — I480 Paroxysmal atrial fibrillation: Secondary | ICD-10-CM

## 2016-06-08 DIAGNOSIS — Z954 Presence of other heart-valve replacement: Secondary | ICD-10-CM

## 2016-06-08 DIAGNOSIS — Z9889 Other specified postprocedural states: Secondary | ICD-10-CM

## 2016-06-08 DIAGNOSIS — I359 Nonrheumatic aortic valve disorder, unspecified: Secondary | ICD-10-CM

## 2016-06-08 DIAGNOSIS — Z8679 Personal history of other diseases of the circulatory system: Secondary | ICD-10-CM

## 2016-06-08 DIAGNOSIS — Z8774 Personal history of (corrected) congenital malformations of heart and circulatory system: Secondary | ICD-10-CM

## 2016-06-08 LAB — POCT INR: INR: 3.3

## 2016-06-11 ENCOUNTER — Encounter: Payer: Self-pay | Admitting: Family Medicine

## 2016-06-11 ENCOUNTER — Ambulatory Visit: Payer: Self-pay | Attending: Family Medicine | Admitting: Family Medicine

## 2016-06-11 VITALS — BP 94/58 | HR 77 | Temp 98.0°F | Ht 63.0 in | Wt 110.6 lb

## 2016-06-11 DIAGNOSIS — Z952 Presence of prosthetic heart valve: Secondary | ICD-10-CM | POA: Insufficient documentation

## 2016-06-11 DIAGNOSIS — Z23 Encounter for immunization: Secondary | ICD-10-CM

## 2016-06-11 DIAGNOSIS — M25552 Pain in left hip: Secondary | ICD-10-CM | POA: Insufficient documentation

## 2016-06-11 DIAGNOSIS — I5042 Chronic combined systolic (congestive) and diastolic (congestive) heart failure: Secondary | ICD-10-CM | POA: Insufficient documentation

## 2016-06-11 DIAGNOSIS — I352 Nonrheumatic aortic (valve) stenosis with insufficiency: Secondary | ICD-10-CM | POA: Insufficient documentation

## 2016-06-11 DIAGNOSIS — I4891 Unspecified atrial fibrillation: Secondary | ICD-10-CM | POA: Insufficient documentation

## 2016-06-11 DIAGNOSIS — Z87891 Personal history of nicotine dependence: Secondary | ICD-10-CM | POA: Insufficient documentation

## 2016-06-11 DIAGNOSIS — J449 Chronic obstructive pulmonary disease, unspecified: Secondary | ICD-10-CM | POA: Insufficient documentation

## 2016-06-11 DIAGNOSIS — M25551 Pain in right hip: Secondary | ICD-10-CM | POA: Insufficient documentation

## 2016-06-11 DIAGNOSIS — Z7901 Long term (current) use of anticoagulants: Secondary | ICD-10-CM | POA: Insufficient documentation

## 2016-06-11 MED ORDER — ALBUTEROL SULFATE HFA 108 (90 BASE) MCG/ACT IN AERS: 2.0000 | INHALATION_SPRAY | Freq: Four times a day (QID) | RESPIRATORY_TRACT | 2 refills | Status: DC | PRN

## 2016-06-11 MED ORDER — TIOTROPIUM BROMIDE MONOHYDRATE 18 MCG IN CAPS: 18.0000 ug | ORAL_CAPSULE | Freq: Every day | RESPIRATORY_TRACT | 2 refills | Status: DC

## 2016-06-11 MED ORDER — LISINOPRIL 5 MG PO TABS: 5.0000 mg | ORAL_TABLET | Freq: Every day | ORAL | 3 refills | Status: DC

## 2016-06-11 NOTE — Patient Instructions (Signed)
Chronic Obstructive Pulmonary Disease Chronic obstructive pulmonary disease (COPD) is a common lung condition in which airflow from the lungs is limited. COPD is a general term that can be used to describe many different lung problems that limit airflow, including both chronic bronchitis and emphysema. If you have COPD, your lung function will probably never return to normal, but there are measures you can take to improve lung function and make yourself feel better. CAUSES   Smoking (common).  Exposure to secondhand smoke.  Genetic problems.  Chronic inflammatory lung diseases or recurrent infections. SYMPTOMS  Shortness of breath, especially with physical activity.  Deep, persistent (chronic) cough with a large amount of thick mucus.  Wheezing.  Rapid breaths (tachypnea).  Gray or bluish discoloration (cyanosis) of the skin, especially in your fingers, toes, or lips.  Fatigue.  Weight loss.  Frequent infections or episodes when breathing symptoms become much worse (exacerbations).  Chest tightness. DIAGNOSIS Your health care provider will take a medical history and perform a physical examination to diagnose COPD. Additional tests for COPD may include:  Lung (pulmonary) function tests.  Chest X-ray.  CT scan.  Blood tests. TREATMENT  Treatment for COPD may include:  Inhaler and nebulizer medicines. These help manage the symptoms of COPD and make your breathing more comfortable.  Supplemental oxygen. Supplemental oxygen is only helpful if you have a low oxygen level in your blood.  Exercise and physical activity. These are beneficial for nearly all people with COPD.  Lung surgery or transplant.  Nutrition therapy to gain weight, if you are underweight.  Pulmonary rehabilitation. This may involve working with a team of health care providers and specialists, such as respiratory, occupational, and physical therapists. HOME CARE INSTRUCTIONS  Take all medicines  (inhaled or pills) as directed by your health care provider.  Avoid over-the-counter medicines or cough syrups that dry up your airway (such as antihistamines) and slow down the elimination of secretions unless instructed otherwise by your health care provider.  If you are a smoker, the most important thing that you can do is stop smoking. Continuing to smoke will cause further lung damage and breathing trouble. Ask your health care provider for help with quitting smoking. He or she can direct you to community resources or hospitals that provide support.  Avoid exposure to irritants such as smoke, chemicals, and fumes that aggravate your breathing.  Use oxygen therapy and pulmonary rehabilitation if directed by your health care provider. If you require home oxygen therapy, ask your health care provider whether you should purchase a pulse oximeter to measure your oxygen level at home.  Avoid contact with individuals who have a contagious illness.  Avoid extreme temperature and humidity changes.  Eat healthy foods. Eating smaller, more frequent meals and resting before meals may help you maintain your strength.  Stay active, but balance activity with periods of rest. Exercise and physical activity will help you maintain your ability to do things you want to do.  Preventing infection and hospitalization is very important when you have COPD. Make sure to receive all the vaccines your health care provider recommends, especially the pneumococcal and influenza vaccines. Ask your health care provider whether you need a pneumonia vaccine.  Learn and use relaxation techniques to manage stress.  Learn and use controlled breathing techniques as directed by your health care provider. Controlled breathing techniques include:  Pursed lip breathing. Start by breathing in (inhaling) through your nose for 1 second. Then, purse your lips as if you were   going to whistle and breathe out (exhale) through the  pursed lips for 2 seconds.  Diaphragmatic breathing. Start by putting one hand on your abdomen just above your waist. Inhale slowly through your nose. The hand on your abdomen should move out. Then purse your lips and exhale slowly. You should be able to feel the hand on your abdomen moving in as you exhale.  Learn and use controlled coughing to clear mucus from your lungs. Controlled coughing is a series of short, progressive coughs. The steps of controlled coughing are: 1. Lean your head slightly forward. 2. Breathe in deeply using diaphragmatic breathing. 3. Try to hold your breath for 3 seconds. 4. Keep your mouth slightly open while coughing twice. 5. Spit any mucus out into a tissue. 6. Rest and repeat the steps once or twice as needed. SEEK MEDICAL CARE IF:  You are coughing up more mucus than usual.  There is a change in the color or thickness of your mucus.  Your breathing is more labored than usual.  Your breathing is faster than usual. SEEK IMMEDIATE MEDICAL CARE IF:  You have shortness of breath while you are resting.  You have shortness of breath that prevents you from:  Being able to talk.  Performing your usual physical activities.  You have chest pain lasting longer than 5 minutes.  Your skin color is more cyanotic than usual.  You measure low oxygen saturations for longer than 5 minutes with a pulse oximeter. MAKE SURE YOU:  Understand these instructions.  Will watch your condition.  Will get help right away if you are not doing well or get worse.   This information is not intended to replace advice given to you by your health care provider. Make sure you discuss any questions you have with your health care provider.   Document Released: 06/06/2005 Document Revised: 09/17/2014 Document Reviewed: 04/23/2013 Elsevier Interactive Patient Education 2016 [employer]  

## 2016-06-11 NOTE — Progress Notes (Signed)
Had flu inj on 11/10/15 Pneum on 01/19/16 Not using either inhaler- can't afford them

## 2016-06-11 NOTE — Progress Notes (Signed)
Subjective:  Patient ID: Sheila Branch, female    DOB: 03/02/1971  Age: 45 y.o. MRN: [id number]  CC: Follow-up and Hip Pain (right sided)   HPI Sheila Branch is  a 45 year old female with a history of congenital heart disease,chronic combined systolic and diastolic heart failure (EF 50-55%), A. fib, COPD, aortic insufficiency, aortic stenosis s/p Mechanical aortic valve replacement with resection of subvalvular aortic membrane, ligation of patent ductus arteriosus and maze procedure.  She comes in today for follow-up from last visit where she had complained of right flank pain for which she received tramadol. Pain has resolved at this time.  Today she complains of bilateral hip pain worse with prolonged sitting or standing and with change in position. She works as a Conservation officer, nature at [employer] and walks around a lot. Pain does not radiate lower extremity.  She received a prescription for Spiriva and albuterol from her cardiologist but was unable to fill them at [facility] due to the cost. Would like them sent to the pharmacy in house.    Past Surgical History:  Procedure Laterality Date  . AORTIC VALVE REPLACEMENT N/A 02/09/2016   Procedure: AORTIC VALVE REPLACEMENT USING A SIZE 23 CARBOMEDICS PROSTHESIS AND RESECTION OF SUBVALVAR AORTIC STENOSIS;  Surgeon: Purcell Nails, MD;  Location: [facility] OR;  Service: Open Heart Surgery;  Laterality: N/A;  . CARDIAC CATHETERIZATION  childhood  . CARDIAC CATHETERIZATION N/A 01/11/2016   Procedure: Right/Left Heart Cath and Coronary Angiography;  Surgeon: Kathleene Hazel, MD;  Location: [facility] INVASIVE CV LAB;  Service: Cardiovascular;  Laterality: N/A;  . CARDIOVERSION N/A 11/09/2015   Procedure: CARDIOVERSION;  Surgeon: Lars Masson, MD;  Location: [facility] ENDOSCOPY;  Service: Cardiovascular;  Laterality: N/A;  . CESAREAN SECTION     pt. denies  . MAZE N/A 02/09/2016   Procedure: MAZE;  Surgeon: Purcell Nails, MD;  Location: [facility] OR;  Service: Open Heart  Surgery;  Laterality: N/A;  . MOUTH SURGERY Bilateral 01-25-16  . MULTIPLE EXTRACTIONS WITH ALVEOLOPLASTY N/A 01/25/2016   Procedure: Extraction of tooth #'s 4,5,16-18,30,31 with alveoloplasty and gross debridement of remaining teeth.;  Surgeon: Charlynne Pander, DDS;  Location: [facility] OR;  Service: Oral Surgery;  Laterality: N/A;  . PATENT DUCTUS ARTERIOUS REPAIR N/A 02/09/2016   Procedure: PATENT DUCTUS ARTERIOSUS (PDA) REPAIR;  Surgeon: Purcell Nails, MD;  Location: [facility] OR;  Service: Open Heart Surgery;  Laterality: N/A;  . 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 TEE WITHOUT CARDIOVERSION N/A 11/09/2015   Procedure: TRANSESOPHAGEAL ECHOCARDIOGRAM (TEE);  Surgeon: Lars Masson, MD;  Location: [facility] ENDOSCOPY;  Service: Cardiovascular;  Laterality: N/A;  . TEE WITHOUT CARDIOVERSION N/A 02/09/2016   Procedure: TRANSESOPHAGEAL ECHOCARDIOGRAM (TEE);  Surgeon: Purcell Nails, MD;  Location: [facility] OR;  Service: Open Heart Surgery;  Laterality: N/A;  . TUBAL LIGATION      Allergies  Allergen Reactions  . Penicillins Cross Reactors Swelling and Other (See Comments)    Mouth Ulcers Has patient had a PCN reaction causing immediate rash, facial/tongue/throat swelling, SOB or lightheadedness with hypotension: Yes Has patient had a PCN reaction causing severe rash involving mucus membranes or skin necrosis: Yes Has patient had a PCN reaction that required hospitalization No Has patient had a PCN reaction occurring within the last 10 years: No If all of the above answers are "NO", then may proceed with Cephalosporin use.          Outpatient Medications  Prior to Visit  Medication Sig Dispense Refill  . metoprolol tartrate (LOPRESSOR) 25 MG tablet Take 0.5 tablets (12.5 mg total) by mouth 2 (two) times daily with a meal. 30 tablet 3  . traMADol (ULTRAM) 50 MG tablet Take 1 tablet (50 mg total) by mouth every 8 (eight) hours as needed. 30 tablet 0  . warfarin (COUMADIN) 1 MG  tablet Take 1 tablet (1 mg total) by mouth daily at 6 PM. Or as directed 30 tablet 1  . lisinopril (PRINIVIL,ZESTRIL) 5 MG tablet Take 2 tablets (10 mg total) by mouth daily. 30 tablet 3  . albuterol (PROVENTIL HFA;VENTOLIN HFA) 108 (90 Base) MCG/ACT inhaler Inhale 2 puffs into the lungs every 6 (six) hours as needed for wheezing or shortness of breath. (Patient not taking: Reported on 06/11/2016) 1 Inhaler 2  . predniSONE (DELTASONE) 20 MG tablet Take 2 tablets (40 mg total) by mouth daily with breakfast. For 3 days only. 6 tablet 0  . tiotropium (SPIRIVA HANDIHALER) 18 MCG inhalation capsule Place 1 capsule (18 mcg total) into inhaler and inhale daily. (Patient not taking: Reported on 06/11/2016) 30 capsule 12   No facility-administered medications prior to visit.     ROS Review of Systems  Constitutional: Negative for activity change and appetite change.  HENT: Negative for sinus pressure and sore throat.   Respiratory: Negative for chest tightness, shortness of breath and wheezing.   Cardiovascular: Negative for chest pain and palpitations.  Gastrointestinal: Negative for abdominal distention, abdominal pain and constipation.  Genitourinary: Negative.   Musculoskeletal:       See hpi  Psychiatric/Behavioral: Negative for behavioral problems and dysphoric mood.    Objective:  BP (!) 94/58 (BP Location: Right Arm, Patient Position: Sitting, Cuff Size: Small)   Pulse 77   Temp 98 F (36.7 C) (Oral)   Ht 5\' 3"  (1.6 m)   Wt 110 lb 9.6 oz (50.2 kg)   SpO2 97%   BMI 19.59 kg/m   BP/Weight 06/11/2016 06/04/2016 06/04/2016  Systolic BP 94 109 110  Diastolic BP 58 70 68  Wt. (Lbs) 110.6 108 110  BMI 19.59 19.13 19.49      Physical Exam  Constitutional: She is oriented to person, place, and time. She appears well-developed and well-nourished.  Cardiovascular: Normal rate and intact distal pulses.   Murmur (2/6 systolic murmur) heard. Pulmonary/Chest: Effort normal and breath sounds  normal. She has no wheezes. She has no rales. She exhibits no tenderness.  Vertical sternotomy scar  Abdominal: Soft. Bowel sounds are normal. She exhibits no distension and no mass. There is no tenderness.  Musculoskeletal: Normal range of motion.  Neurological: She is alert and oriented to person, place, and time.     Assessment & Plan:   1. Need for influenza vaccination Flu shot administered  2. Pain of both hip joints Likely underlying osteoarthritis Unable to place on NSAIDs that she is currently on meloxicam If symptoms persist or placed on tramadol Discussed exercises - XR Pelvis 1-2 Views  3. Chronic combined systolic and diastolic heart failure (HCC) EF 50-55% from 2-D echo of 04/2016 Blood pressure is on the low side, take 5 mg lisinopril - lisinopril (PRINIVIL,ZESTRIL) 5 MG tablet; Take 1 tablet (5 mg total) by mouth daily.  Dispense: 30 tablet; Refill: 3  4. Chronic obstructive pulmonary disease, unspecified COPD type (HCC) She quit smoking 10 months ago Had been unable to obtain these prescriptions written by cardiology due to cost; we'll sent to pharmacy in-house which  will be more cost effective. - albuterol (PROVENTIL HFA;VENTOLIN HFA) 108 (90 Base) MCG/ACT inhaler; Inhale 2 puffs into the lungs every 6 (six) hours as needed for wheezing or shortness of breath.  Dispense: 1 Inhaler; Refill: 2 - tiotropium (SPIRIVA HANDIHALER) 18 MCG inhalation capsule; Place 1 capsule (18 mcg total) into inhaler and inhale daily.  Dispense: 30 capsule; Refill: 2  5. Atrial fibrillation, AVR Anticoagulation with Coumadin, last INR was 3.3 Followed by the Coumadin clinic.  Meds ordered this encounter  Medications  . albuterol (PROVENTIL HFA;VENTOLIN HFA) 108 (90 Base) MCG/ACT inhaler    Sig: Inhale 2 puffs into the lungs every 6 (six) hours as needed for wheezing or shortness of breath.    Dispense:  1 Inhaler    Refill:  2  . tiotropium (SPIRIVA HANDIHALER) 18 MCG inhalation  capsule    Sig: Place 1 capsule (18 mcg total) into inhaler and inhale daily.    Dispense:  30 capsule    Refill:  2  . lisinopril (PRINIVIL,ZESTRIL) 5 MG tablet    Sig: Take 1 tablet (5 mg total) by mouth daily.    Dispense:  30 tablet    Refill:  3    Discontinue previous dose    Follow-up: Return in about 3 months (around 09/11/2016), or if symptoms worsen or fail to improve, for Follow-up on CHF.   Jaclyn ShaggyEnobong Amao MD

## 2016-06-27 ENCOUNTER — Other Ambulatory Visit: Payer: Self-pay | Admitting: *Deleted

## 2016-06-27 MED ORDER — WARFARIN SODIUM 1 MG PO TABS: 1.0000 mg | ORAL_TABLET | Freq: Every day | ORAL | 1 refills | Status: DC

## 2016-07-15 IMAGING — CR DG CHEST 1V PORT
1 series · 1 of 1 positions shown · non-contrast
Comparison: 02/11/2016

CLINICAL DATA: Congestive heart failure

EXAM:
PORTABLE CHEST 1 VIEW

[AP]
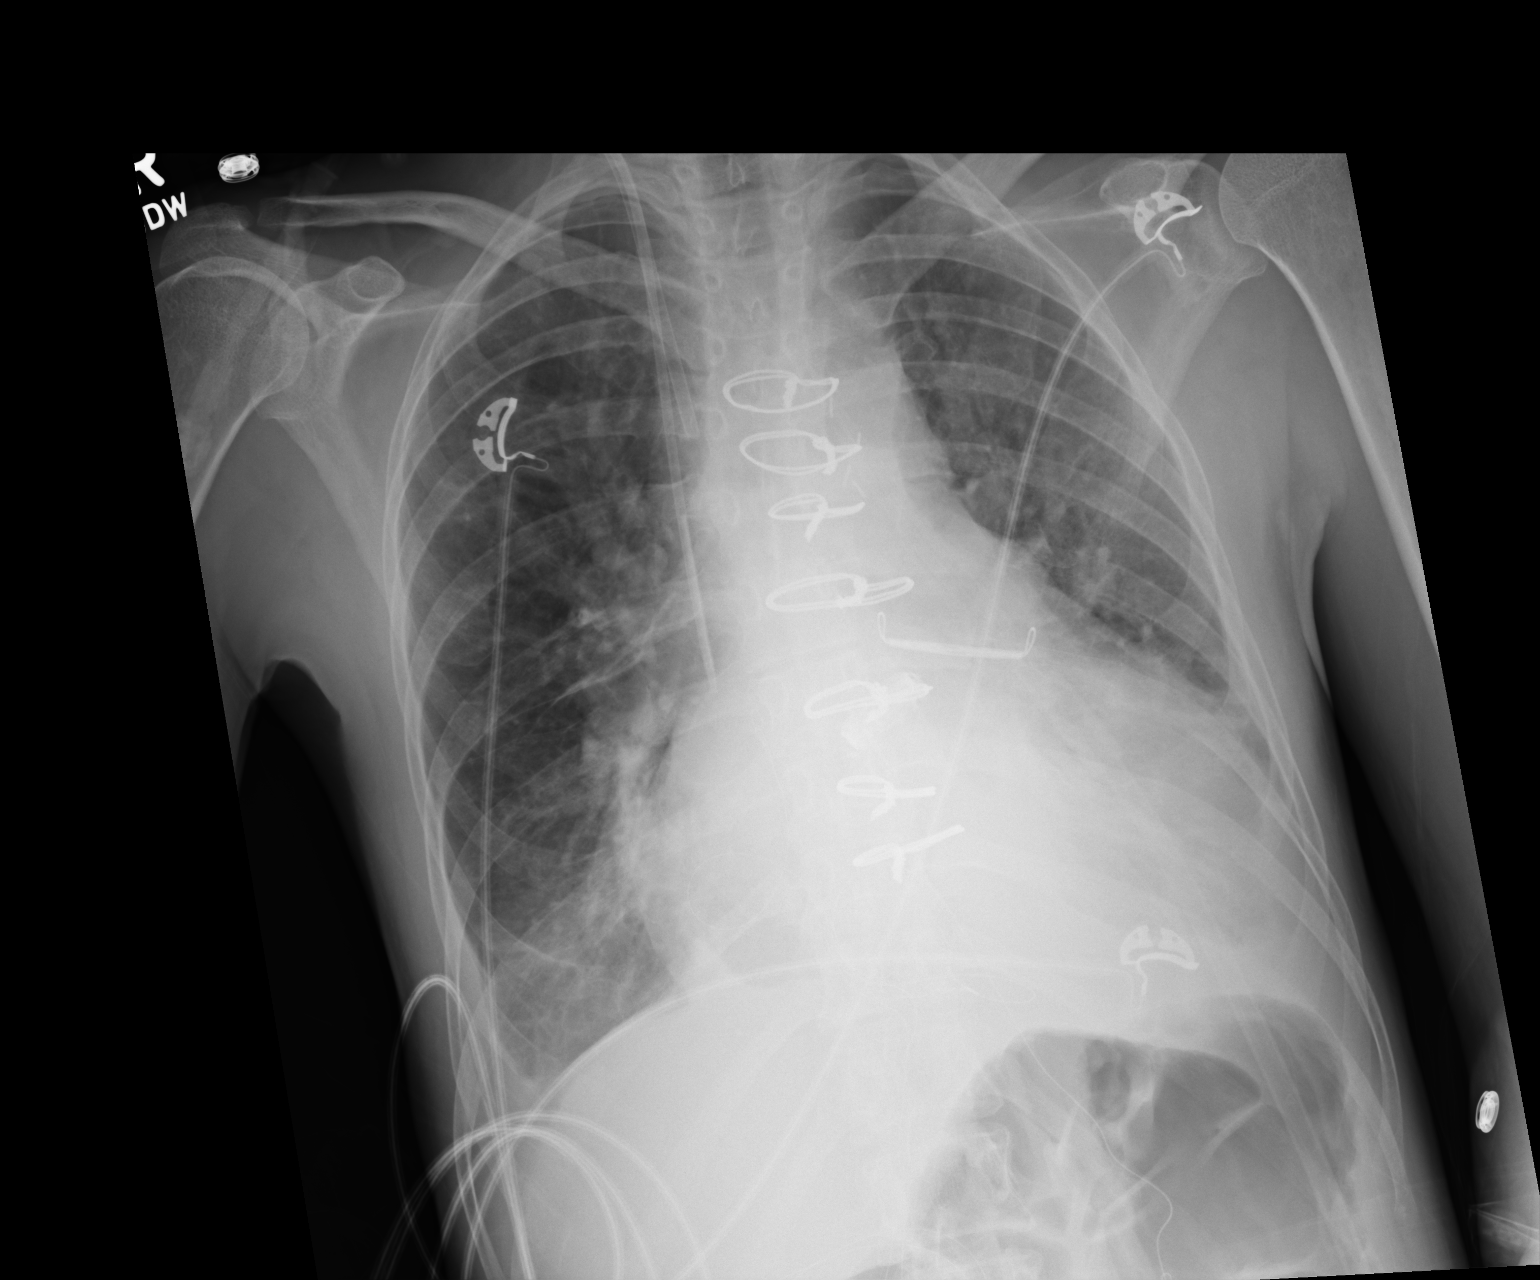

[1 of 1 positions shown; findings below may reference images not displayed]

FINDINGS: Right central line remains in place, unchanged. Prior median
sternotomy and valve replacement. Cardiomegaly with vascular
congestion. Improving bilateral airspace opacities, likely improving
edema. Mild residual opacity/ edema per cyst. Small bilateral
effusions.
IMPRESSION: Improving pulmonary edema pattern with mild residual edema/ CHF.

Small bilateral effusions.

## 2016-07-16 IMAGING — DX DG CHEST 2V
2 series · 2 of 2 positions shown · non-contrast
Comparison: 02/12/2016

CLINICAL DATA: Atelectasis, CHF

EXAM:
CHEST  2 VIEW

[chest pa]
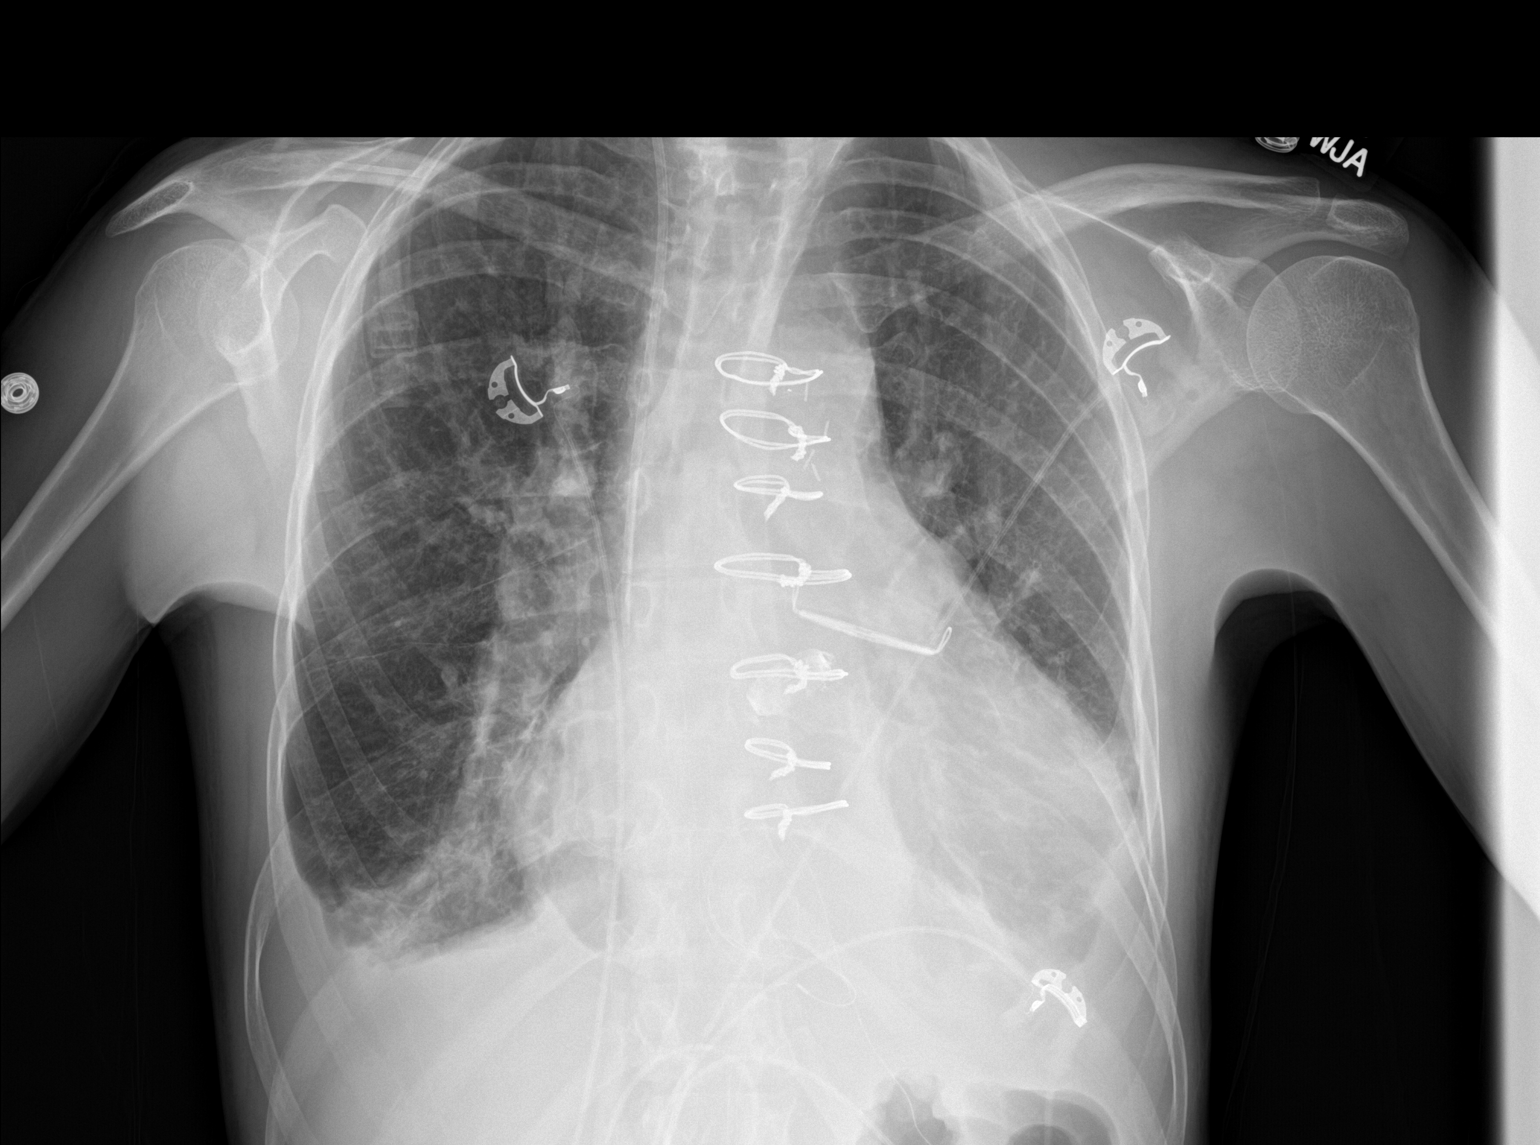

[chest lat]
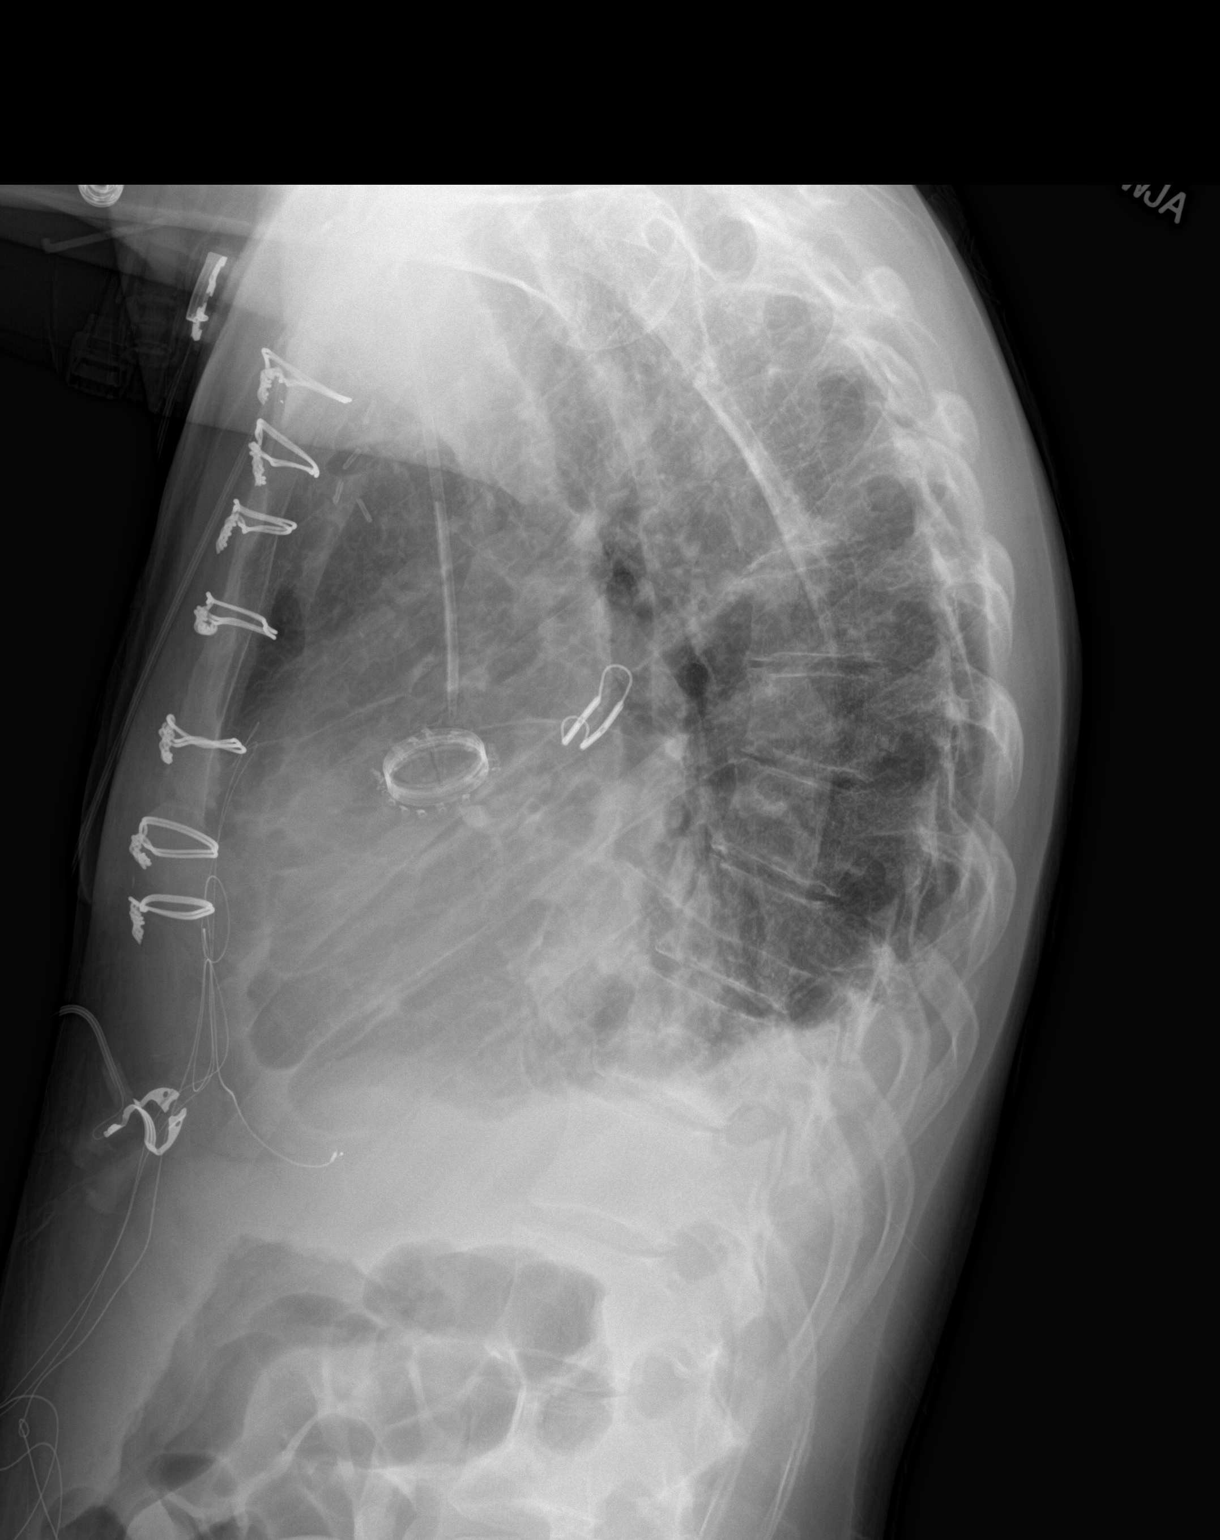

[2 of 2 positions shown; findings below may reference images not displayed]

FINDINGS: Prior CABG. Cardiomegaly. Small bilateral pleural effusions with
bibasilar atelectasis or infiltrates. Underlying COPD.

Right central line remains in place, unchanged.
IMPRESSION: Small bilateral effusions with bibasilar atelectasis or infiltrates.

Cardiomegaly, COPD.

## 2016-07-23 ENCOUNTER — Encounter: Payer: Self-pay | Admitting: Cardiology

## 2016-07-23 ENCOUNTER — Encounter: Payer: Self-pay | Admitting: *Deleted

## 2016-08-15 DIAGNOSIS — Z736 Limitation of activities due to disability: Secondary | ICD-10-CM

## 2016-08-22 ENCOUNTER — Encounter: Payer: Self-pay | Admitting: Cardiology

## 2016-08-24 ENCOUNTER — Other Ambulatory Visit: Payer: Self-pay | Admitting: Family Medicine

## 2016-08-24 DIAGNOSIS — I480 Paroxysmal atrial fibrillation: Secondary | ICD-10-CM

## 2016-08-30 ENCOUNTER — Encounter (HOSPITAL_COMMUNITY): Payer: Self-pay | Admitting: Nurse Practitioner

## 2016-08-30 ENCOUNTER — Ambulatory Visit (INDEPENDENT_AMBULATORY_CARE_PROVIDER_SITE_OTHER): Payer: Self-pay | Admitting: Cardiology

## 2016-08-30 ENCOUNTER — Ambulatory Visit (HOSPITAL_COMMUNITY)
Admission: RE | Admit: 2016-08-30 | Discharge: 2016-08-30 | Disposition: A | Discharge status: Home / Self Care | Payer: Self-pay | Source: Ambulatory Visit | Attending: Nurse Practitioner | Admitting: Nurse Practitioner

## 2016-08-30 VITALS — BP 96/58 | Ht 63.0 in | Wt 111.8 lb

## 2016-08-30 VITALS — BP 108/52 | HR 77 | Ht 63.0 in | Wt 111.0 lb

## 2016-08-30 DIAGNOSIS — K766 Portal hypertension: Secondary | ICD-10-CM | POA: Insufficient documentation

## 2016-08-30 DIAGNOSIS — Z952 Presence of prosthetic heart valve: Secondary | ICD-10-CM | POA: Insufficient documentation

## 2016-08-30 DIAGNOSIS — Z9889 Other specified postprocedural states: Secondary | ICD-10-CM

## 2016-08-30 DIAGNOSIS — Z87891 Personal history of nicotine dependence: Secondary | ICD-10-CM | POA: Insufficient documentation

## 2016-08-30 DIAGNOSIS — F419 Anxiety disorder, unspecified: Secondary | ICD-10-CM | POA: Insufficient documentation

## 2016-08-30 DIAGNOSIS — J449 Chronic obstructive pulmonary disease, unspecified: Secondary | ICD-10-CM | POA: Insufficient documentation

## 2016-08-30 DIAGNOSIS — I480 Paroxysmal atrial fibrillation: Secondary | ICD-10-CM | POA: Insufficient documentation

## 2016-08-30 DIAGNOSIS — Z7901 Long term (current) use of anticoagulants: Secondary | ICD-10-CM | POA: Insufficient documentation

## 2016-08-30 DIAGNOSIS — I4891 Unspecified atrial fibrillation: Secondary | ICD-10-CM

## 2016-08-30 DIAGNOSIS — Z880 Allergy status to penicillin: Secondary | ICD-10-CM | POA: Insufficient documentation

## 2016-08-30 DIAGNOSIS — I429 Cardiomyopathy, unspecified: Secondary | ICD-10-CM | POA: Insufficient documentation

## 2016-08-30 DIAGNOSIS — Z8774 Personal history of (corrected) congenital malformations of heart and circulatory system: Secondary | ICD-10-CM

## 2016-08-30 DIAGNOSIS — Z79899 Other long term (current) drug therapy: Secondary | ICD-10-CM | POA: Insufficient documentation

## 2016-08-30 DIAGNOSIS — I359 Nonrheumatic aortic valve disorder, unspecified: Secondary | ICD-10-CM

## 2016-08-30 DIAGNOSIS — I5042 Chronic combined systolic (congestive) and diastolic (congestive) heart failure: Secondary | ICD-10-CM | POA: Insufficient documentation

## 2016-08-30 DIAGNOSIS — Z954 Presence of other heart-valve replacement: Secondary | ICD-10-CM

## 2016-08-30 DIAGNOSIS — I2721 Secondary pulmonary arterial hypertension: Secondary | ICD-10-CM | POA: Insufficient documentation

## 2016-08-30 DIAGNOSIS — Z8679 Personal history of other diseases of the circulatory system: Secondary | ICD-10-CM

## 2016-08-30 DIAGNOSIS — I428 Other cardiomyopathies: Secondary | ICD-10-CM

## 2016-08-30 LAB — CBC WITH DIFFERENTIAL/PLATELET
Basophils Absolute: 0 cells/uL (ref 0–200)
Basophils Relative: 0 %
Eosinophils Absolute: 1264 cells/uL — ABNORMAL HIGH (ref 15–500)
Eosinophils Relative: 16 %
HCT: 42.3 % (ref 35.0–45.0)
Hemoglobin: 14 g/dL (ref 11.7–15.5)
Lymphocytes Relative: 28 %
Lymphs Abs: 2212 cells/uL (ref 850–3900)
MCH: 30.5 pg (ref 27.0–33.0)
MCHC: 33.1 g/dL (ref 32.0–36.0)
MCV: 92.2 fL (ref 80.0–100.0)
MPV: 10 fL (ref 7.5–12.5)
Monocytes Absolute: 553 cells/uL (ref 200–950)
Monocytes Relative: 7 %
Neutro Abs: 3871 cells/uL (ref 1500–7800)
Neutrophils Relative %: 49 %
Platelets: 258 10*3/uL (ref 140–400)
RBC: 4.59 MIL/uL (ref 3.80–5.10)
RDW: 14.8 % (ref 11.0–15.0)
WBC: 7.9 10*3/uL (ref 3.8–10.8)

## 2016-08-30 LAB — TSH: TSH: 2.28 mIU/L

## 2016-08-30 NOTE — Progress Notes (Signed)
Primary Care Physician: Arnoldo Morale, MD Referring Physician: Dr. Jolee Ewing Sheila Branch is a 45 y.o. female  In the afib clinic for long term surveillance of atrial fibrillation and  maze procedure. She is status post resection of subvalvular aortic membrane, aortic valve replacement using a bileaflet mechanical prosthetic valve, Maze procedure, and ligation of patent ductus arteriosus on 02/09/2016. She reports that she is feeling well and has not noted any  irregular heart beat. No shortness of breath. Continues on warfarin for mechanical valve. Was on amiodarone short term after surgery.  Today, she denies symptoms of palpitations, chest pain, shortness of breath, orthopnea, PND, lower extremity edema, dizziness, presyncope, syncope, or neurologic sequela. The patient is tolerating medications without difficulties and is otherwise without complaint today.   Past Medical History:  Diagnosis Date  . Anxiety   . Aortic insufficiency    a. 10/2015 Echo: mod-sev AI;  b. 02/09/2016 23 mm Sorin [employer] Top Hat bileaflet mechanical valve -->anticoagulated w/ coumadin;  c. 02/17/2016 Echo: EF 25-30%, mod AS, mild AI, mild MR.  . CHD (congenital heart disease)    a. PDA s/p ligation 02/09/16.  Marland Kitchen Chronic combined systolic and diastolic congestive heart failure ([place])    a. 10/2015 Echo: EF 50-55%;  b. 02/17/2016 Echo (post-op): EF 25-30%, anteroseptal AK.  Marland Kitchen COPD (chronic obstructive pulmonary disease) ([place])    a. Remote tobacco abuse - quit 10/2015.  Marland Kitchen History of pneumonia   . NICM (nonischemic cardiomyopathy) ([place])    a. 10/2015 Echo: EF 50-55%;  b. 01/2016 Cath: nl cors;  c. 02/17/2016 (post-op): EF 25-30%, anteroseptal AK.  Marland Kitchen PAF (paroxysmal atrial fibrillation) ([place]) 01/08/2016   a. 10/2015 s/p TEE & DCCV;  b. 12/2015 recurrent AF-->Amio-->converted to sinus.  Marland Kitchen PAH (pulmonary arterial hypertension) with portal hypertension ([place])    a. 01/2016 RHC: PA 90/44.  Marland Kitchen Prolonged QT interval    a. Stable @ ~  508 msec on amio 24m daily.  . 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 02/09/2016 s/p 23 mm Sorin Carbomedics Top Hat bileaflet mechanical valve -->anticoagulated w/ coumadin.  . S/P repair of patent ductus arteriosus 02/09/2016  . Subvalvar aortic stenosis    a. 02/09/2016 s/p resection of subvalvular Ao membrane.   Past Surgical History:  Procedure Laterality Date  . AORTIC VALVE REPLACEMENT N/A 02/09/2016   Procedure: AORTIC VALVE REPLACEMENT USING A SIZE 23 CARBOMEDICS PROSTHESIS AND RESECTION OF SUBVALVAR AORTIC STENOSIS;  Surgeon: CRexene Alberts MD;  [place] [facility]  Service: Open Heart Surgery;  Laterality: N/A;  . CARDIAC CATHETERIZATION  childhood  . CARDIAC CATHETERIZATION N/A 01/11/2016   Procedure: Right/Left Heart Cath and Coronary Angiography;  Surgeon: CBurnell Blanks MD;  [facility];  Service: Cardiovascular;  Laterality: N/A;  . CARDIOVERSION N/A 11/09/2015   Procedure: CARDIOVERSION;  Surgeon: KDorothy Spark MD;  [place] [facility]  Service: Cardiovascular;  Laterality: N/A;  . CESAREAN SECTION     pt. denies  . MAZE N/A 02/09/2016   Procedure: MAZE;  Surgeon: CRexene Alberts MD;  [place] [facility]  Service: Open Heart Surgery;  Laterality: N/A;  . MOUTH SURGERY Bilateral 01-25-16  . MULTIPLE EXTRACTIONS WITH ALVEOLOPLASTY N/A 01/25/2016   Procedure: Extraction of tooth #'s 40,7,62-26,33,35with alveoloplasty and gross debridement of remaining teeth.;  Surgeon: RLenn Cal DDS;  [place] [facility]  Service: Oral Surgery;  Laterality: N/A;  . PATENT DUCTUS  ARTERIOUS REPAIR N/A 02/09/2016   Procedure: PATENT DUCTUS ARTERIOSUS (PDA) REPAIR;  Surgeon: Rexene Alberts, MD;  Location: [place];  Service: Open Heart Surgery;  Laterality: N/A;  . 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 TEE WITHOUT CARDIOVERSION N/A 11/09/2015   Procedure: TRANSESOPHAGEAL ECHOCARDIOGRAM (TEE);   Surgeon: Dorothy Spark, MD;  Location: [place];  Service: Cardiovascular;  Laterality: N/A;  . TEE WITHOUT CARDIOVERSION N/A 02/09/2016   Procedure: TRANSESOPHAGEAL ECHOCARDIOGRAM (TEE);  Surgeon: Rexene Alberts, MD;  Location: [place];  Service: Open Heart Surgery;  Laterality: N/A;  . TUBAL LIGATION      Current Outpatient Prescriptions  Medication Sig Dispense Refill  . albuterol (PROVENTIL HFA;VENTOLIN HFA) 108 (90 Base) MCG/ACT inhaler Inhale 2 puffs into the lungs every 6 (six) hours as needed for wheezing or shortness of breath. 1 Inhaler 2  . lisinopril (PRINIVIL,ZESTRIL) 5 MG tablet Take 1 tablet (5 mg total) by mouth daily. 30 tablet 3  . metoprolol tartrate (LOPRESSOR) 25 MG tablet TAKE ONE-HALF TABLET BY MOUTH TWICE DAILY WITH A MEAL 30 tablet 2  . traMADol (ULTRAM) 50 MG tablet Take 1 tablet (50 mg total) by mouth every 8 (eight) hours as needed. 30 tablet 0  . warfarin (COUMADIN) 1 MG tablet Take 1 tablet (1 mg total) by mouth daily at 6 PM. Or as directed 35 tablet 1   No current facility-administered medications for this encounter.     Allergies  Allergen Reactions  . Penicillins Cross Reactors Swelling and Other (See Comments)    Mouth Ulcers Has patient had a PCN reaction causing immediate rash, facial/tongue/throat swelling, SOB or lightheadedness with hypotension: Yes Has patient had a PCN reaction causing severe rash involving mucus membranes or skin necrosis: Yes Has patient had a PCN reaction that required hospitalization No Has patient had a PCN reaction occurring within the last 10 years: No If all of the above answers are "NO", then may proceed with Cephalosporin use.     Social History   Social History  . Marital status: Single    Spouse name: N/A  . Number of children: N/A  . Years of education: N/A   Occupational History  . Not on file.   Social History Main Topics  . Smoking status: Former Smoker    Packs/day: 1.00    Years: 23.00     Types: Cigarettes    Quit date: 09/12/2015  . Smokeless tobacco: Never Used  . Alcohol use Not on file     Comment: last time 3 weeks ago  . Drug use:     Types: Marijuana     Comment: last time two weeks ago  . Sexual activity: Not on file   Other Topics Concern  . Not on file   Social History Narrative  . No narrative on file    Family History  Problem Relation Age of Onset  . Other Father     only met once, health unknown  . Heart disease Mother     "i think she died of heart issue"    ROS- All systems are reviewed and negative except as per the HPI above  Physical Exam: Vitals:   08/30/16 1350  BP: (!) 96/58  Weight: 111 lb 12.8 oz (50.7 kg)  Height: _0  (1.6 m)   Wt Readings from Last 3 Encounters:  08/30/16 111 lb 12.8 oz (50.7 kg)  06/11/16 110 lb 9.6 oz (50.2 kg)  06/04/16 110 lb (49.9 kg)    Labs: Lab Results  Component Value Date   NA 137 02/13/2016   K 3.6 02/13/2016   CL 100 (L) 02/13/2016   CO2 28 02/13/2016   GLUCOSE 101 (H) 02/13/2016   BUN 16 02/13/2016   CREATININE 0.74 02/13/2016   CALCIUM 8.3 (L) 02/13/2016   MG 2.4 02/10/2016   Lab Results  Component Value Date   INR 3.3 06/08/2016   No results found for: CHOL, HDL, LDLCALC, TRIG   GEN- The patient is well appearing, alert and oriented x 3 today.   Head- normocephalic, atraumatic Eyes-  Sclera clear, conjunctiva pink Ears- hearing intact Oropharynx- clear Neck- supple, no JVP Lymph- no cervical lymphadenopathy Lungs- Clear to ausculation bilaterally, normal work of breathing Heart- Regular rate and rhythm, no murmurs, rubs or gallops, PMI not laterally displaced GI- soft, NT, ND, + BS Extremities- no clubbing, cyanosis, or edema MS- no significant deformity or atrophy Skin- no rash or lesion Psych- euthymic mood, full affect Neuro- strength and sensation are intact  EKG-SR with sinus arrhythmia and one PVC Epic records reviewed    Assessment and Plan: 1. S/p AOV  replacement with mechanical valve/Maze procedure Doing well without any evidence of afib post op Continue coumadin Continue ace/ BB  F/u one year for long term surveillance of maze procedure  Butch Penny C. Sephira Zellman, [facility] [address] [place], [place] [phone]

## 2016-08-30 NOTE — Patient Instructions (Signed)
Medication Instructions:   Your physician recommends that you continue on your current medications as directed. Please refer to the Current Medication list given to you today.   Labwork:  TODAY--CMET, CBC W DIFF, TSH, AND PT/INR    Follow-Up:  Your physician wants you to follow-up in: 6 MONTHS WITH DR Johnell Comings will receive a reminder letter in the mail two months in advance. If you don't receive a letter, please call our office to schedule the follow-up appointment.   SCHEDULE AN APPOINTMENT WITH COUMADIN CLINIC FOR CONTINUOUS FOLLOWING OF YOUR LEVELS     If you need a refill on your cardiac medications before your next appointment, please call your pharmacy.

## 2016-08-30 NOTE — Progress Notes (Signed)
Office Visit    Patient Name: Sheila Branch Date of Encounter: 08/30/2016  Primary Care Provider:  Jaclyn Shaggy, MD Primary Cardiologist:  Eloy End, MD   Chief Complaint    45 year old female with a history of severe aortic insufficiency, subaortic stenosis, paroxysmal atrial fibrillation, and patent ductus arteriosus status post mechanical aortic valve replacement on 02/09/2016, who presents for follow-up.  Past Medical History    Past Medical History:  Diagnosis Date  . Anxiety   . Aortic insufficiency    a. 10/2015 Echo: mod-sev AI;  b. 02/09/2016 23 mm Sorin Carbomedics Top Hat bileaflet mechanical valve -->anticoagulated w/ coumadin;  c. 02/17/2016 Echo: EF 25-30%, mod AS, mild AI, mild MR.  . CHD (congenital heart disease)    a. PDA s/p ligation 02/09/16.  Marland Kitchen Chronic combined systolic and diastolic congestive heart failure (HCC)    a. 10/2015 Echo: EF 50-55%;  b. 02/17/2016 Echo (post-op): EF 25-30%, anteroseptal AK.  Marland Kitchen COPD (chronic obstructive pulmonary disease) (HCC)    a. Remote tobacco abuse - quit 10/2015.  Marland Kitchen History of pneumonia   . NICM (nonischemic cardiomyopathy) (HCC)    a. 10/2015 Echo: EF 50-55%;  b. 01/2016 Cath: nl cors;  c. 02/17/2016 (post-op): EF 25-30%, anteroseptal AK.  Marland Kitchen PAF (paroxysmal atrial fibrillation) (HCC) 01/08/2016   a. 10/2015 s/p TEE & DCCV;  b. 12/2015 recurrent AF-->Amio-->converted to sinus.  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 01/2016 RHC: PA 90/44.  Marland Kitchen Prolonged QT interval    a. Stable @ ~ 508 msec on amio 200mg  daily.  . 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 02/09/2016 s/p 23 mm Sorin Carbomedics Top Hat bileaflet mechanical valve -->anticoagulated w/ coumadin.  . S/P repair of patent ductus arteriosus 02/09/2016  . Subvalvar aortic stenosis    a. 02/09/2016 s/p resection of subvalvular Ao membrane.   Past Surgical History:  Procedure Laterality Date    . AORTIC VALVE REPLACEMENT N/A 02/09/2016   Procedure: AORTIC VALVE REPLACEMENT USING A SIZE 23 CARBOMEDICS PROSTHESIS AND RESECTION OF SUBVALVAR AORTIC STENOSIS;  Surgeon: Purcell Nails, MD;  Location: [facility] OR;  Service: Open Heart Surgery;  Laterality: N/A;  . CARDIAC CATHETERIZATION  childhood  . CARDIAC CATHETERIZATION N/A 01/11/2016   Procedure: Right/Left Heart Cath and Coronary Angiography;  Surgeon: Kathleene Hazel, MD;  Location: [facility] INVASIVE CV LAB;  Service: Cardiovascular;  Laterality: N/A;  . CARDIOVERSION N/A 11/09/2015   Procedure: CARDIOVERSION;  Surgeon: Lars Masson, MD;  Location: [facility] ENDOSCOPY;  Service: Cardiovascular;  Laterality: N/A;  . CESAREAN SECTION     pt. denies  . MAZE N/A 02/09/2016   Procedure: MAZE;  Surgeon: Purcell Nails, MD;  Location: [facility] OR;  Service: Open Heart Surgery;  Laterality: N/A;  . MOUTH SURGERY Bilateral 01-25-16  . MULTIPLE EXTRACTIONS WITH ALVEOLOPLASTY N/A 01/25/2016   Procedure: Extraction of tooth #'s 4,5,16-18,30,31 with alveoloplasty and gross debridement of remaining teeth.;  Surgeon: Charlynne Pander, DDS;  Location: [facility] OR;  Service: Oral Surgery;  Laterality: N/A;  . PATENT DUCTUS ARTERIOUS REPAIR N/A 02/09/2016   Procedure: PATENT DUCTUS ARTERIOSUS (PDA) REPAIR;  Surgeon: Purcell Nails, MD;  Location: [facility] OR;  Service: Open Heart Surgery;  Laterality: N/A;  . 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 TEE WITHOUT CARDIOVERSION N/A 11/09/2015   Procedure: TRANSESOPHAGEAL ECHOCARDIOGRAM (TEE);  Surgeon:  Lars Masson, MD;  Location: [facility] ENDOSCOPY;  Service: Cardiovascular;  Laterality: N/A;  . TEE WITHOUT CARDIOVERSION N/A 02/09/2016   Procedure: TRANSESOPHAGEAL ECHOCARDIOGRAM (TEE);  Surgeon: Purcell Nails, MD;  Location: [facility] OR;  Service: Open Heart Surgery;  Laterality: N/A;  . TUBAL LIGATION      Allergies  Allergies  Allergen Reactions  . Penicillins Cross Reactors Swelling and Other (See  Comments)    Mouth Ulcers Has patient had a PCN reaction causing immediate rash, facial/tongue/throat swelling, SOB or lightheadedness with hypotension: Yes Has patient had a PCN reaction causing severe rash involving mucus membranes or skin necrosis: Yes Has patient had a PCN reaction that required hospitalization No Has patient had a PCN reaction occurring within the last 10 years: No If all of the above answers are "NO", then may proceed with Cephalosporin use.     History of Present Illness    45 year old female with the above complex past medical history including severe aortic insufficiency status post mechanical aortic valve replacement, subaortic stenosis status post resection of subaortic membrane, patent ductus arteriosus status post resection, paroxysmal atrial fibrillation currently maintained on amiodarone, status post maze procedure, pulmonary hypertension, nonischemic cardiomyopathy. She first began to experience progressive dyspnea in late 2016 and was subsequently admitted in February 2017 with pneumonia and rapid atrial fibrillation. She had normal LV function at that time and require TEE and cardioversion. She was also noted to have severe aortic insufficiency and subvalvular aortic membrane. She was readmitted in late April 2017 with GI illness and diarrhea. In that setting, she had recurrent atrial fibrillation and was placed on amiodarone with subsequent conversion to sinus rhythm. She underwent diagnostic catheterization revealing normal coronary arteries with elevated right heart pressures including a pulmonary artery pressure of 90/44. Cardiac output and index were within normal limits. She was subsequently referred to thoracic surgery and it was felt that she would benefit from mechanical aortic valve replacement with resection of the subaortic membrane. This was ultimately performed on June 1 in addition to ligation of the patent ductus arteriosus and maze procedure. Hospital  course was relatively uncomplicated. She did require IV diuresis for volume overload. Echo performed on June 9, showed an EF of 25-30% which was reduced from previous recordings. Valve was working normally and she was discharged. Since her discharge, she reports having done quite well. She has not required Lasix and has noticed significant improvement in exercise tolerance overall. She has been trying to supplement her diet with boost shakes and is currently weighing 91 pounds, up from 84 pounds postoperatively. She has not had significant incisional chest pain and is not currently taking when necessary narcotics. She is very careful about her sodium intake and has been weighing herself daily. She denies dyspnea, PND, orthopnea, dizziness, syncope, edema, or early satiety.  06/04/2016 - this is a 3 months follow-up, she states that she feels great able to do most activities of daily living, only in the last couple days she has noticed worsening of shortness of breath, however not lower extremity edema no orthopnea or proximal nocturnal dyspnea. She saw Dr. Cornelius Moras earlier this morning and he noticed wheezing on physical exam. She denies fevers or chills, she has nonproductive cough. She feels congested in her sinuses and has watery eyes. She has history of long-term smoking and quit earlier disease year. She has never been treated for COPD. She denies any palpitations or syncope. She used to have mild chest pain in a sternotomy side but that  has resolved.   08/30/2016 - 3 months follow up, the patient looks and feels great. She has completed cardiac rehab and continues to exercise on her own and denies any chest pain or DOE. No palpitations or syncope. She has been compliant with her meds, no bleeding with Warfarin. She is very pleased that she was able to gain some weight.    Home Medications    Prior to Admission medications   Medication Sig Start Date End Date Taking? Authorizing Provider  amiodarone  (PACERONE) 200 MG tablet Take 1 tablet (200 mg total) by mouth daily. 02/17/16  Yes Donielle Margaretann Loveless, PA-C  aspirin EC 81 MG EC tablet Take 1 tablet (81 mg total) by mouth daily. 02/17/16  Yes Donielle Margaretann Loveless, PA-C  lisinopril (PRINIVIL,ZESTRIL) 10 MG tablet Take 1 tablet (10 mg total) by mouth daily. 02/21/16  Yes Jaclyn Shaggy, MD  metoprolol tartrate (LOPRESSOR) 25 MG tablet Take 0.5 tablets (12.5 mg total) by mouth 2 (two) times daily with a meal. 02/17/16  Yes Donielle Margaretann Loveless, PA-C  oxyCODONE-acetaminophen (PERCOCET) 5-325 MG tablet Take one or two tablets by mouth every 4-6 hours as needed for moderate to severe  pain. 02/17/16  Yes Donielle Margaretann Loveless, PA-C  warfarin (COUMADIN) 1 MG tablet Take 1 tablet (1 mg total) by mouth daily at 6 PM. Or as directed 02/17/16  Yes Ardelle Balls, PA-C    Review of Systems    As above, she has been doing quite well.  She denies chest pain, palpitations, dyspnea, pnd, orthopnea, n, v, dizziness, syncope, edema, weight gain, or early satiety.  All other systems reviewed and are otherwise negative except as noted above.  Physical Exam    VS:  BP (!) 108/52   Pulse 77   Ht 5\' 3"  (1.6 m)   Wt 111 lb (50.3 kg)   SpO2 97%   BMI 19.66 kg/m  , BMI Body mass index is 19.66 kg/m. GEN: Very thin, well-appearing, in no acute distress.  HEENT: normal.  Neck: Supple, no JVD, carotid bruits, or masses. Cardiac: RR, mech S2, no rubs, or gallops. No clubbing, cyanosis, edema.  Radials/DP/PT 2+ and equal bilaterally. Midsternal surgical incision is healing well without erythema, bleeding, or discharge. Respiratory:  Respirations regular and unlabored, wheezing bilaterally. GI: Soft, nontender, nondistended, BS + x 4. MS: no deformity or atrophy. Skin: warm and dry, no rash. Neuro:  Strength and sensation are intact. Psych: Normal affect.  Accessory Clinical Findings    ECG - Regular sinus rhythm, 60, PACs, LVH with repolarization abnormality, QT  508-stable.   Assessment & Plan    1.  History of severe aortic insufficiency status post mechanical AVR: Patient is status post surgery on June 1 and has been doing well since discharge. No bleeding with Coumadin. However she hasn't followed with the coumadin clinic, we will re-establish her coumadin clinic follow up. Transaortic gradients on the echo in August 2017 were normal with mean gradient 12 mmHg.  2. History of subaortic membrane: Status post resection. Doing well as above.  3. Nonischemic cardiomyopathy/chronic combined systolic and diastolic congestive heart failure: Echo done just prior to discharge showed LV dysfunction at 25-30%, however repeat LVEF on echo in August 2017 showed improved LVEF of 50-55%. Continue metoprolol and lisinopril.   5. History of patent ductus arteriosus: Status post recent ligation in the setting of surgery as above.  6. Pulmonary arterial hypertension: PA pressure at the time of her heart Was 90/44. RVSP on  the repeat echocardiogram in August 2017 was completely normal.  7. Acute COPD exacerbation - resolved.  Follow up in 6 months.  Tobias AlexanderKatarina Carmen Vallecillo, MD 08/30/2016, 2:38 PM

## 2016-08-31 LAB — COMPREHENSIVE METABOLIC PANEL
ALT: 16 U/L (ref 6–29)
AST: 21 U/L (ref 10–30)
Albumin: 3.7 g/dL (ref 3.6–5.1)
Alkaline Phosphatase: 79 U/L (ref 33–115)
BUN: 11 mg/dL (ref 7–25)
CO2: 21 mmol/L (ref 20–31)
Calcium: 8.6 mg/dL (ref 8.6–10.2)
Chloride: 106 mmol/L (ref 98–110)
Creat: 0.71 mg/dL (ref 0.50–1.10)
Glucose, Bld: 57 mg/dL — ABNORMAL LOW (ref 65–99)
Potassium: 4 mmol/L (ref 3.5–5.3)
Sodium: 139 mmol/L (ref 135–146)
Total Bilirubin: 0.5 mg/dL (ref 0.2–1.2)
Total Protein: 6.8 g/dL (ref 6.1–8.1)

## 2016-08-31 LAB — PROTIME-INR
INR: 1.5 — ABNORMAL HIGH
Prothrombin Time: 16 s — ABNORMAL HIGH (ref 9.0–11.5)

## 2016-09-06 ENCOUNTER — Ambulatory Visit (INDEPENDENT_AMBULATORY_CARE_PROVIDER_SITE_OTHER): Payer: Self-pay | Admitting: *Deleted

## 2016-09-06 DIAGNOSIS — Z8679 Personal history of other diseases of the circulatory system: Secondary | ICD-10-CM

## 2016-09-06 DIAGNOSIS — I4891 Unspecified atrial fibrillation: Secondary | ICD-10-CM

## 2016-09-06 DIAGNOSIS — Z9889 Other specified postprocedural states: Secondary | ICD-10-CM

## 2016-09-06 DIAGNOSIS — I4892 Unspecified atrial flutter: Secondary | ICD-10-CM

## 2016-09-06 DIAGNOSIS — I480 Paroxysmal atrial fibrillation: Secondary | ICD-10-CM

## 2016-09-06 DIAGNOSIS — Z954 Presence of other heart-valve replacement: Secondary | ICD-10-CM

## 2016-09-06 DIAGNOSIS — I359 Nonrheumatic aortic valve disorder, unspecified: Secondary | ICD-10-CM

## 2016-09-06 DIAGNOSIS — Z8774 Personal history of (corrected) congenital malformations of heart and circulatory system: Secondary | ICD-10-CM

## 2016-09-06 LAB — POCT INR: INR: 2.1

## 2016-09-15 ENCOUNTER — Other Ambulatory Visit: Payer: Self-pay | Admitting: Cardiology

## 2016-09-17 ENCOUNTER — Encounter: Payer: Self-pay | Admitting: Cardiology

## 2016-09-21 ENCOUNTER — Ambulatory Visit (INDEPENDENT_AMBULATORY_CARE_PROVIDER_SITE_OTHER): Payer: Self-pay | Admitting: Pharmacist

## 2016-09-21 DIAGNOSIS — Z8679 Personal history of other diseases of the circulatory system: Secondary | ICD-10-CM

## 2016-09-21 DIAGNOSIS — Z954 Presence of other heart-valve replacement: Secondary | ICD-10-CM

## 2016-09-21 DIAGNOSIS — I480 Paroxysmal atrial fibrillation: Secondary | ICD-10-CM

## 2016-09-21 DIAGNOSIS — I4892 Unspecified atrial flutter: Secondary | ICD-10-CM

## 2016-09-21 DIAGNOSIS — Z8774 Personal history of (corrected) congenital malformations of heart and circulatory system: Secondary | ICD-10-CM

## 2016-09-21 DIAGNOSIS — I4891 Unspecified atrial fibrillation: Secondary | ICD-10-CM

## 2016-09-21 DIAGNOSIS — Z9889 Other specified postprocedural states: Secondary | ICD-10-CM

## 2016-09-21 DIAGNOSIS — I359 Nonrheumatic aortic valve disorder, unspecified: Secondary | ICD-10-CM

## 2016-09-21 LAB — POCT INR: INR: 1.7

## 2016-10-02 ENCOUNTER — Ambulatory Visit (INDEPENDENT_AMBULATORY_CARE_PROVIDER_SITE_OTHER): Payer: Self-pay | Admitting: *Deleted

## 2016-10-02 DIAGNOSIS — I359 Nonrheumatic aortic valve disorder, unspecified: Secondary | ICD-10-CM

## 2016-10-02 DIAGNOSIS — I4891 Unspecified atrial fibrillation: Secondary | ICD-10-CM

## 2016-10-02 DIAGNOSIS — Z9889 Other specified postprocedural states: Secondary | ICD-10-CM

## 2016-10-02 DIAGNOSIS — Z954 Presence of other heart-valve replacement: Secondary | ICD-10-CM

## 2016-10-02 DIAGNOSIS — Z8774 Personal history of (corrected) congenital malformations of heart and circulatory system: Secondary | ICD-10-CM

## 2016-10-02 DIAGNOSIS — I480 Paroxysmal atrial fibrillation: Secondary | ICD-10-CM

## 2016-10-02 DIAGNOSIS — I4892 Unspecified atrial flutter: Secondary | ICD-10-CM

## 2016-10-02 DIAGNOSIS — Z8679 Personal history of other diseases of the circulatory system: Secondary | ICD-10-CM

## 2016-10-02 LAB — POCT INR: INR: 2.1

## 2016-10-08 ENCOUNTER — Other Ambulatory Visit: Payer: Self-pay | Admitting: Family Medicine

## 2016-10-08 DIAGNOSIS — Q249 Congenital malformation of heart, unspecified: Secondary | ICD-10-CM

## 2016-10-08 DIAGNOSIS — I5042 Chronic combined systolic (congestive) and diastolic (congestive) heart failure: Secondary | ICD-10-CM

## 2016-10-10 ENCOUNTER — Other Ambulatory Visit: Payer: Self-pay | Admitting: Family Medicine

## 2016-10-10 DIAGNOSIS — I5042 Chronic combined systolic (congestive) and diastolic (congestive) heart failure: Secondary | ICD-10-CM

## 2016-10-10 DIAGNOSIS — Q249 Congenital malformation of heart, unspecified: Secondary | ICD-10-CM

## 2016-10-11 MED ORDER — LISINOPRIL 5 MG PO TABS: 5.0000 mg | ORAL_TABLET | Freq: Every day | ORAL | 3 refills | Status: DC

## 2016-10-22 ENCOUNTER — Ambulatory Visit (INDEPENDENT_AMBULATORY_CARE_PROVIDER_SITE_OTHER): Payer: Self-pay

## 2016-10-22 DIAGNOSIS — Z954 Presence of other heart-valve replacement: Secondary | ICD-10-CM

## 2016-10-22 DIAGNOSIS — Z9889 Other specified postprocedural states: Secondary | ICD-10-CM

## 2016-10-22 DIAGNOSIS — I4892 Unspecified atrial flutter: Secondary | ICD-10-CM

## 2016-10-22 DIAGNOSIS — Z8774 Personal history of (corrected) congenital malformations of heart and circulatory system: Secondary | ICD-10-CM

## 2016-10-22 DIAGNOSIS — I4891 Unspecified atrial fibrillation: Secondary | ICD-10-CM

## 2016-10-22 DIAGNOSIS — I480 Paroxysmal atrial fibrillation: Secondary | ICD-10-CM

## 2016-10-22 DIAGNOSIS — I359 Nonrheumatic aortic valve disorder, unspecified: Secondary | ICD-10-CM

## 2016-10-22 DIAGNOSIS — Z8679 Personal history of other diseases of the circulatory system: Secondary | ICD-10-CM

## 2016-10-22 LAB — POCT INR: INR: 2.2

## 2016-11-05 ENCOUNTER — Ambulatory Visit (INDEPENDENT_AMBULATORY_CARE_PROVIDER_SITE_OTHER): Payer: Self-pay | Admitting: *Deleted

## 2016-11-05 DIAGNOSIS — I4891 Unspecified atrial fibrillation: Secondary | ICD-10-CM

## 2016-11-05 DIAGNOSIS — Z954 Presence of other heart-valve replacement: Secondary | ICD-10-CM

## 2016-11-05 DIAGNOSIS — Z8774 Personal history of (corrected) congenital malformations of heart and circulatory system: Secondary | ICD-10-CM

## 2016-11-05 DIAGNOSIS — Z9889 Other specified postprocedural states: Secondary | ICD-10-CM

## 2016-11-05 DIAGNOSIS — I480 Paroxysmal atrial fibrillation: Secondary | ICD-10-CM

## 2016-11-05 DIAGNOSIS — I359 Nonrheumatic aortic valve disorder, unspecified: Secondary | ICD-10-CM

## 2016-11-05 DIAGNOSIS — Z8679 Personal history of other diseases of the circulatory system: Secondary | ICD-10-CM

## 2016-11-05 DIAGNOSIS — I4892 Unspecified atrial flutter: Secondary | ICD-10-CM

## 2016-11-05 LAB — POCT INR: INR: 2.3

## 2016-11-15 ENCOUNTER — Other Ambulatory Visit: Payer: Self-pay | Admitting: Cardiology

## 2016-11-16 ENCOUNTER — Other Ambulatory Visit: Payer: Self-pay | Admitting: *Deleted

## 2016-11-16 MED ORDER — WARFARIN SODIUM 1 MG PO TABS: ORAL_TABLET | ORAL | 1 refills | Status: DC

## 2016-11-22 ENCOUNTER — Ambulatory Visit (INDEPENDENT_AMBULATORY_CARE_PROVIDER_SITE_OTHER): Payer: Self-pay | Admitting: Pharmacist

## 2016-11-22 DIAGNOSIS — I480 Paroxysmal atrial fibrillation: Secondary | ICD-10-CM

## 2016-11-22 DIAGNOSIS — I4891 Unspecified atrial fibrillation: Secondary | ICD-10-CM

## 2016-11-22 DIAGNOSIS — Z8679 Personal history of other diseases of the circulatory system: Secondary | ICD-10-CM

## 2016-11-22 DIAGNOSIS — Z9889 Other specified postprocedural states: Secondary | ICD-10-CM

## 2016-11-22 DIAGNOSIS — I359 Nonrheumatic aortic valve disorder, unspecified: Secondary | ICD-10-CM

## 2016-11-22 DIAGNOSIS — I4892 Unspecified atrial flutter: Secondary | ICD-10-CM

## 2016-11-22 DIAGNOSIS — Z8774 Personal history of (corrected) congenital malformations of heart and circulatory system: Secondary | ICD-10-CM

## 2016-11-22 DIAGNOSIS — Z954 Presence of other heart-valve replacement: Secondary | ICD-10-CM

## 2016-11-22 LAB — POCT INR: INR: 2.2

## 2016-11-24 ENCOUNTER — Other Ambulatory Visit: Payer: Self-pay | Admitting: Family Medicine

## 2016-11-24 DIAGNOSIS — I480 Paroxysmal atrial fibrillation: Secondary | ICD-10-CM

## 2016-12-06 ENCOUNTER — Ambulatory Visit (INDEPENDENT_AMBULATORY_CARE_PROVIDER_SITE_OTHER): Payer: Self-pay | Admitting: *Deleted

## 2016-12-06 DIAGNOSIS — I4891 Unspecified atrial fibrillation: Secondary | ICD-10-CM

## 2016-12-06 DIAGNOSIS — Z8679 Personal history of other diseases of the circulatory system: Secondary | ICD-10-CM

## 2016-12-06 DIAGNOSIS — I4892 Unspecified atrial flutter: Secondary | ICD-10-CM

## 2016-12-06 DIAGNOSIS — I480 Paroxysmal atrial fibrillation: Secondary | ICD-10-CM

## 2016-12-06 DIAGNOSIS — Z8774 Personal history of (corrected) congenital malformations of heart and circulatory system: Secondary | ICD-10-CM

## 2016-12-06 DIAGNOSIS — I359 Nonrheumatic aortic valve disorder, unspecified: Secondary | ICD-10-CM

## 2016-12-06 DIAGNOSIS — Z9889 Other specified postprocedural states: Secondary | ICD-10-CM

## 2016-12-06 DIAGNOSIS — Z954 Presence of other heart-valve replacement: Secondary | ICD-10-CM

## 2016-12-06 LAB — POCT INR: INR: 3.7

## 2016-12-24 ENCOUNTER — Other Ambulatory Visit: Payer: Self-pay | Admitting: Family Medicine

## 2016-12-24 DIAGNOSIS — I480 Paroxysmal atrial fibrillation: Secondary | ICD-10-CM

## 2016-12-27 ENCOUNTER — Ambulatory Visit (INDEPENDENT_AMBULATORY_CARE_PROVIDER_SITE_OTHER): Payer: Self-pay | Admitting: *Deleted

## 2016-12-27 DIAGNOSIS — I480 Paroxysmal atrial fibrillation: Secondary | ICD-10-CM

## 2016-12-27 DIAGNOSIS — Z8774 Personal history of (corrected) congenital malformations of heart and circulatory system: Secondary | ICD-10-CM

## 2016-12-27 DIAGNOSIS — Z9889 Other specified postprocedural states: Secondary | ICD-10-CM

## 2016-12-27 DIAGNOSIS — Z8679 Personal history of other diseases of the circulatory system: Secondary | ICD-10-CM

## 2016-12-27 DIAGNOSIS — I359 Nonrheumatic aortic valve disorder, unspecified: Secondary | ICD-10-CM

## 2016-12-27 DIAGNOSIS — I4892 Unspecified atrial flutter: Secondary | ICD-10-CM

## 2016-12-27 DIAGNOSIS — Z954 Presence of other heart-valve replacement: Secondary | ICD-10-CM

## 2016-12-27 DIAGNOSIS — I4891 Unspecified atrial fibrillation: Secondary | ICD-10-CM

## 2016-12-27 LAB — POCT INR: INR: 2.7

## 2017-01-14 ENCOUNTER — Other Ambulatory Visit: Payer: Self-pay | Admitting: Cardiology

## 2017-01-18 ENCOUNTER — Ambulatory Visit (INDEPENDENT_AMBULATORY_CARE_PROVIDER_SITE_OTHER): Payer: Self-pay | Admitting: Pharmacist

## 2017-01-18 DIAGNOSIS — Z8679 Personal history of other diseases of the circulatory system: Secondary | ICD-10-CM

## 2017-01-18 DIAGNOSIS — Z8774 Personal history of (corrected) congenital malformations of heart and circulatory system: Secondary | ICD-10-CM

## 2017-01-18 DIAGNOSIS — I480 Paroxysmal atrial fibrillation: Secondary | ICD-10-CM

## 2017-01-18 DIAGNOSIS — I359 Nonrheumatic aortic valve disorder, unspecified: Secondary | ICD-10-CM

## 2017-01-18 DIAGNOSIS — Z9889 Other specified postprocedural states: Secondary | ICD-10-CM

## 2017-01-18 DIAGNOSIS — Z954 Presence of other heart-valve replacement: Secondary | ICD-10-CM

## 2017-01-18 DIAGNOSIS — I4892 Unspecified atrial flutter: Secondary | ICD-10-CM

## 2017-01-18 DIAGNOSIS — I4891 Unspecified atrial fibrillation: Secondary | ICD-10-CM

## 2017-01-18 LAB — POCT INR: INR: 2.6

## 2017-01-25 ENCOUNTER — Other Ambulatory Visit: Payer: Self-pay | Admitting: Family Medicine

## 2017-01-25 DIAGNOSIS — I480 Paroxysmal atrial fibrillation: Secondary | ICD-10-CM

## 2017-01-26 ENCOUNTER — Other Ambulatory Visit: Payer: Self-pay | Admitting: Family Medicine

## 2017-01-26 DIAGNOSIS — I480 Paroxysmal atrial fibrillation: Secondary | ICD-10-CM

## 2017-01-28 ENCOUNTER — Telehealth: Payer: Self-pay | Admitting: Family Medicine

## 2017-01-28 NOTE — Telephone Encounter (Signed)
Refill request

## 2017-01-28 NOTE — Telephone Encounter (Signed)
Patient called the office to request medication refill for metoprolol tartrate (LOPRESSOR) 25 MG tablet. Please send it to [facility] on [address]   Thank you.

## 2017-01-29 NOTE — Telephone Encounter (Signed)
Metoprolol refilled by Dr. Venetia Night yesterday.

## 2017-02-10 ENCOUNTER — Other Ambulatory Visit: Payer: Self-pay | Admitting: Family Medicine

## 2017-02-10 DIAGNOSIS — I5042 Chronic combined systolic (congestive) and diastolic (congestive) heart failure: Secondary | ICD-10-CM

## 2017-02-15 ENCOUNTER — Ambulatory Visit (INDEPENDENT_AMBULATORY_CARE_PROVIDER_SITE_OTHER): Payer: Self-pay | Admitting: *Deleted

## 2017-02-15 DIAGNOSIS — I4891 Unspecified atrial fibrillation: Secondary | ICD-10-CM

## 2017-02-15 DIAGNOSIS — I480 Paroxysmal atrial fibrillation: Secondary | ICD-10-CM

## 2017-02-15 DIAGNOSIS — Z9889 Other specified postprocedural states: Secondary | ICD-10-CM

## 2017-02-15 DIAGNOSIS — Z954 Presence of other heart-valve replacement: Secondary | ICD-10-CM

## 2017-02-15 DIAGNOSIS — Z8774 Personal history of (corrected) congenital malformations of heart and circulatory system: Secondary | ICD-10-CM

## 2017-02-15 DIAGNOSIS — I359 Nonrheumatic aortic valve disorder, unspecified: Secondary | ICD-10-CM

## 2017-02-15 DIAGNOSIS — Z8679 Personal history of other diseases of the circulatory system: Secondary | ICD-10-CM

## 2017-02-15 DIAGNOSIS — I4892 Unspecified atrial flutter: Secondary | ICD-10-CM

## 2017-02-15 LAB — POCT INR: INR: 2.5

## 2017-02-17 ENCOUNTER — Other Ambulatory Visit: Payer: Self-pay | Admitting: Family Medicine

## 2017-02-17 DIAGNOSIS — I480 Paroxysmal atrial fibrillation: Secondary | ICD-10-CM

## 2017-03-04 ENCOUNTER — Ambulatory Visit: Payer: Self-pay | Admitting: Thoracic Surgery (Cardiothoracic Vascular Surgery)

## 2017-03-18 ENCOUNTER — Ambulatory Visit: Payer: Self-pay | Admitting: Thoracic Surgery (Cardiothoracic Vascular Surgery)

## 2017-03-28 ENCOUNTER — Other Ambulatory Visit: Payer: Self-pay | Admitting: Family Medicine

## 2017-03-28 DIAGNOSIS — I480 Paroxysmal atrial fibrillation: Secondary | ICD-10-CM

## 2017-03-29 ENCOUNTER — Ambulatory Visit (INDEPENDENT_AMBULATORY_CARE_PROVIDER_SITE_OTHER): Payer: Self-pay | Admitting: *Deleted

## 2017-03-29 DIAGNOSIS — I480 Paroxysmal atrial fibrillation: Secondary | ICD-10-CM

## 2017-03-29 DIAGNOSIS — I4892 Unspecified atrial flutter: Secondary | ICD-10-CM

## 2017-03-29 DIAGNOSIS — I4891 Unspecified atrial fibrillation: Secondary | ICD-10-CM

## 2017-03-29 DIAGNOSIS — Z9889 Other specified postprocedural states: Secondary | ICD-10-CM

## 2017-03-29 DIAGNOSIS — Z8774 Personal history of (corrected) congenital malformations of heart and circulatory system: Secondary | ICD-10-CM

## 2017-03-29 DIAGNOSIS — Z8679 Personal history of other diseases of the circulatory system: Secondary | ICD-10-CM

## 2017-03-29 DIAGNOSIS — I359 Nonrheumatic aortic valve disorder, unspecified: Secondary | ICD-10-CM

## 2017-03-29 DIAGNOSIS — Z954 Presence of other heart-valve replacement: Secondary | ICD-10-CM

## 2017-03-29 LAB — POCT INR: INR: 2.6

## 2017-03-30 ENCOUNTER — Other Ambulatory Visit: Payer: Self-pay | Admitting: Family Medicine

## 2017-03-30 DIAGNOSIS — I480 Paroxysmal atrial fibrillation: Secondary | ICD-10-CM

## 2017-04-04 ENCOUNTER — Telehealth: Payer: Self-pay | Admitting: Family Medicine

## 2017-04-04 DIAGNOSIS — I480 Paroxysmal atrial fibrillation: Secondary | ICD-10-CM

## 2017-04-04 NOTE — Telephone Encounter (Signed)
Pt. Called requesting a refill on metoprolol tartrate (LOPRESSOR) 25 MG tablet  Pt. Has scheduled an appt. For 04/10/17 and would like to know if she can get medication  Until her appt. Time. Pt. Uses [facility] pharmacy on [address]. Please f/u

## 2017-04-05 MED ORDER — METOPROLOL TARTRATE 25 MG PO TABS: ORAL_TABLET | ORAL | 0 refills | Status: DC

## 2017-04-05 NOTE — Telephone Encounter (Signed)
Refilled

## 2017-04-10 ENCOUNTER — Ambulatory Visit: Payer: Self-pay | Attending: Family Medicine | Admitting: Family Medicine

## 2017-04-10 ENCOUNTER — Encounter: Payer: Self-pay | Admitting: Family Medicine

## 2017-04-10 VITALS — BP 91/60 | HR 84 | Temp 97.5°F | Ht 63.0 in | Wt 104.0 lb

## 2017-04-10 DIAGNOSIS — Z880 Allergy status to penicillin: Secondary | ICD-10-CM | POA: Insufficient documentation

## 2017-04-10 DIAGNOSIS — I110 Hypertensive heart disease with heart failure: Secondary | ICD-10-CM | POA: Insufficient documentation

## 2017-04-10 DIAGNOSIS — J449 Chronic obstructive pulmonary disease, unspecified: Secondary | ICD-10-CM | POA: Insufficient documentation

## 2017-04-10 DIAGNOSIS — I2721 Secondary pulmonary arterial hypertension: Secondary | ICD-10-CM | POA: Insufficient documentation

## 2017-04-10 DIAGNOSIS — I480 Paroxysmal atrial fibrillation: Secondary | ICD-10-CM

## 2017-04-10 DIAGNOSIS — M25552 Pain in left hip: Secondary | ICD-10-CM | POA: Insufficient documentation

## 2017-04-10 DIAGNOSIS — Z7902 Long term (current) use of antithrombotics/antiplatelets: Secondary | ICD-10-CM | POA: Insufficient documentation

## 2017-04-10 DIAGNOSIS — I429 Cardiomyopathy, unspecified: Secondary | ICD-10-CM | POA: Insufficient documentation

## 2017-04-10 DIAGNOSIS — K766 Portal hypertension: Secondary | ICD-10-CM | POA: Insufficient documentation

## 2017-04-10 DIAGNOSIS — M25551 Pain in right hip: Secondary | ICD-10-CM

## 2017-04-10 DIAGNOSIS — Z955 Presence of coronary angioplasty implant and graft: Secondary | ICD-10-CM | POA: Insufficient documentation

## 2017-04-10 DIAGNOSIS — Z9851 Tubal ligation status: Secondary | ICD-10-CM | POA: Insufficient documentation

## 2017-04-10 DIAGNOSIS — Z952 Presence of prosthetic heart valve: Secondary | ICD-10-CM | POA: Insufficient documentation

## 2017-04-10 DIAGNOSIS — Z9889 Other specified postprocedural states: Secondary | ICD-10-CM | POA: Insufficient documentation

## 2017-04-10 DIAGNOSIS — Z720 Tobacco use: Secondary | ICD-10-CM

## 2017-04-10 DIAGNOSIS — G8929 Other chronic pain: Secondary | ICD-10-CM | POA: Insufficient documentation

## 2017-04-10 DIAGNOSIS — I5042 Chronic combined systolic (congestive) and diastolic (congestive) heart failure: Secondary | ICD-10-CM

## 2017-04-10 MED ORDER — LISINOPRIL 5 MG PO TABS: ORAL_TABLET | ORAL | 5 refills | Status: DC

## 2017-04-10 MED ORDER — METOPROLOL TARTRATE 25 MG PO TABS: 12.5000 mg | ORAL_TABLET | Freq: Two times a day (BID) | ORAL | 5 refills | Status: DC

## 2017-04-10 MED ORDER — BUPROPION HCL ER (SR) 150 MG PO TB12: 150.0000 mg | ORAL_TABLET | Freq: Two times a day (BID) | ORAL | 2 refills | Status: DC

## 2017-04-10 NOTE — Progress Notes (Signed)
Subjective:  Patient ID: Sheila Branch, female    DOB: 07-17-1971  Age: 46 y.o. MRN: [id number]  CC: Hypertension and Medication Refill   HPI Sheila Branch  is  a 46 year old female with a history of tobacco abuse, congenital heart disease,chronic combined systolic and diastolic heart failure (EF 50-55%), A. fib, COPD, aortic insufficiency, aortic stenosis s/p Mechanical aortic valve replacement with resection of subvalvular aortic membrane, ligation of patent ductus arteriosus and maze procedure.  She has been compliant with Coumadin and is followed by the Coumadin clinic; last INR was 2.6 last week.  Today she complains of bilateral hip pain worse with prolonged sitting or standing and with change in position and this has been going on for the last 6-7 months. She works as a Conservation officer, nature at [employer] and walks around a lot. Pain does not radiate down her lower extremities lower extremity.   She continues to smoke and would like help in quitting.  Past Medical History:  Diagnosis Date  . Anxiety   . Aortic insufficiency    a. 10/2015 Echo: mod-sev AI;  b. 02/09/2016 23 mm Sorin Carbomedics Top Hat bileaflet mechanical valve -->anticoagulated w/ coumadin;  c. 02/17/2016 Echo: EF 25-30%, mod AS, mild AI, mild MR.  . CHD (congenital heart disease)    a. PDA s/p ligation 02/09/16.  Marland Kitchen Chronic combined systolic and diastolic congestive heart failure (HCC)    a. 10/2015 Echo: EF 50-55%;  b. 02/17/2016 Echo (post-op): EF 25-30%, anteroseptal AK.  Marland Kitchen COPD (chronic obstructive pulmonary disease) (HCC)    a. Remote tobacco abuse - quit 10/2015.  Marland Kitchen History of pneumonia   . NICM (nonischemic cardiomyopathy) (HCC)    a. 10/2015 Echo: EF 50-55%;  b. 01/2016 Cath: nl cors;  c. 02/17/2016 (post-op): EF 25-30%, anteroseptal AK.  Marland Kitchen PAF (paroxysmal atrial fibrillation) (HCC) 01/08/2016   a. 10/2015 s/p TEE & DCCV;  b. 12/2015 recurrent AF-->Amio-->converted to sinus.  Marland Kitchen PAH (pulmonary arterial hypertension) with portal  hypertension (HCC)    a. 01/2016 RHC: PA 90/44.  Marland Kitchen Prolonged QT interval    a. Stable @ ~ 508 msec on amio 200mg  daily.  . 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 02/09/2016 s/p 23 mm Sorin Carbomedics Top Hat bileaflet mechanical valve -->anticoagulated w/ coumadin.  . S/P repair of patent ductus arteriosus 02/09/2016  . Subvalvar aortic stenosis    a. 02/09/2016 s/p resection of subvalvular Ao membrane.    Past Surgical History:  Procedure Laterality Date  . AORTIC VALVE REPLACEMENT N/A 02/09/2016   Procedure: AORTIC VALVE REPLACEMENT USING A SIZE 23 CARBOMEDICS PROSTHESIS AND RESECTION OF SUBVALVAR AORTIC STENOSIS;  Surgeon: Purcell Nails, MD;  Location: [facility] OR;  Service: Open Heart Surgery;  Laterality: N/A;  . CARDIAC CATHETERIZATION  childhood  . CARDIAC CATHETERIZATION N/A 01/11/2016   Procedure: Right/Left Heart Cath and Coronary Angiography;  Surgeon: Kathleene Hazel, MD;  Location: [facility] INVASIVE CV LAB;  Service: Cardiovascular;  Laterality: N/A;  . CARDIOVERSION N/A 11/09/2015   Procedure: CARDIOVERSION;  Surgeon: Lars Masson, MD;  Location: [facility] ENDOSCOPY;  Service: Cardiovascular;  Laterality: N/A;  . CESAREAN SECTION     pt. denies  . MAZE N/A 02/09/2016   Procedure: MAZE;  Surgeon: Purcell Nails, MD;  Location: [facility] OR;  Service: Open Heart Surgery;  Laterality: N/A;  . MOUTH SURGERY Bilateral 01-25-16  . MULTIPLE EXTRACTIONS WITH ALVEOLOPLASTY N/A 01/25/2016   Procedure: Extraction of tooth #'  s [phone] with alveoloplasty and gross debridement of remaining teeth.;  Surgeon: Charlynne Pander, DDS;  Location: [facility] OR;  Service: Oral Surgery;  Laterality: N/A;  . PATENT DUCTUS ARTERIOUS REPAIR N/A 02/09/2016   Procedure: PATENT DUCTUS ARTERIOSUS (PDA) REPAIR;  Surgeon: Purcell Nails, MD;  Location: [facility] OR;  Service: Open Heart Surgery;  Laterality: N/A;  . 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 TEE WITHOUT CARDIOVERSION N/A 11/09/2015   Procedure: TRANSESOPHAGEAL ECHOCARDIOGRAM (TEE);  Surgeon: Lars Masson, MD;  Location: [facility] ENDOSCOPY;  Service: Cardiovascular;  Laterality: N/A;  . TEE WITHOUT CARDIOVERSION N/A 02/09/2016   Procedure: TRANSESOPHAGEAL ECHOCARDIOGRAM (TEE);  Surgeon: Purcell Nails, MD;  Location: [facility] OR;  Service: Open Heart Surgery;  Laterality: N/A;  . TUBAL LIGATION      Allergies  Allergen Reactions  . Penicillins Cross Reactors Swelling and Other (See Comments)    Mouth Ulcers Has patient had a PCN reaction causing immediate rash, facial/tongue/throat swelling, SOB or lightheadedness with hypotension: Yes Has patient had a PCN reaction causing severe rash involving mucus membranes or skin necrosis: Yes Has patient had a PCN reaction that required hospitalization No Has patient had a PCN reaction occurring within the last 10 years: No If all of the above answers are "NO", then may proceed with Cephalosporin use.      Outpatient Medications Prior to Visit  Medication Sig Dispense Refill  . albuterol (PROVENTIL HFA;VENTOLIN HFA) 108 (90 Base) MCG/ACT inhaler Inhale 2 puffs into the lungs every 6 (six) hours as needed for wheezing or shortness of breath. 1 Inhaler 2  . traMADol (ULTRAM) 50 MG tablet Take 1 tablet (50 mg total) by mouth every 8 (eight) hours as needed. 30 tablet 0  . warfarin (COUMADIN) 1 MG tablet TAKE AS DIRECTED BY COUMADIN CLINIC 40 tablet 2  . lisinopril (PRINIVIL,ZESTRIL) 5 MG tablet TAKE ONE TABLET BY MOUTH ONCE DAILY (DISCONTINUE PREVIOUS DOSE) 30 tablet 3  . metoprolol tartrate (LOPRESSOR) 25 MG tablet TAKE 1/2 (ONE-HALF) TABLET BY MOUTH TWICE DAILY WITH MEALS 30 tablet 0   No facility-administered medications prior to visit.     ROS Review of Systems  Constitutional: Negative for activity change, appetite change and fatigue.  HENT: Negative for congestion, sinus pressure and sore throat.   Eyes: Negative for visual  disturbance.  Respiratory: Negative for cough, chest tightness, shortness of breath and wheezing.   Cardiovascular: Negative for chest pain and palpitations.  Gastrointestinal: Negative for abdominal distention, abdominal pain and constipation.  Endocrine: Negative for polydipsia.  Genitourinary: Negative for dysuria and frequency.  Musculoskeletal:       See hpi  Skin: Negative for rash.  Neurological: Negative for tremors, light-headedness and numbness.  Hematological: Does not bruise/bleed easily.  Psychiatric/Behavioral: Negative for agitation and behavioral problems.    Objective:  BP 91/60   Pulse 84   Temp (!) 97.5 F (36.4 C) (Oral)   Ht 5\' 3"  (1.6 m)   Wt 104 lb (47.2 kg)   SpO2 96%   BMI 18.42 kg/m   BP/Weight 04/10/2017 08/30/2016 08/30/2016  Systolic BP 91 96 108  Diastolic BP 60 58 52  Wt. (Lbs) 104 111.8 111  BMI 18.42 19.8 19.66      Physical Exam Constitutional: She is oriented to person, place, and time. She appears well-developed and well-nourished.  Cardiovascular: Normal rate and intact distal pulses.   Murmur (2/6 systolic murmur) heard. Pulmonary/Chest: Effort normal  and breath sounds normal. She has no wheezes. She has no rales. She exhibits no tenderness.  Vertical sternotomy scar  Abdominal: Soft. Bowel sounds are normal. She exhibits no distension and no mass. There is no tenderness.  Musculoskeletal: Tenderness associated on range of motion of both hips.  Neurological: She is alert and oriented to person, place, and time. Psych: Normal  Assessment & Plan:   1. Chronic combined systolic and diastolic heart failure (HCC) EF 50-55% from 2-D echo 04/2016 Euvolemic Keep appointment with cardiology - lisinopril (PRINIVIL,ZESTRIL) 5 MG tablet; TAKE ONE TABLET BY MOUTH ONCE DAILY (DISCONTINUE PREVIOUS DOSE)  Dispense: 30 tablet; Refill: 5 - Comprehensive metabolic panel; Future - Lipid panel; Future  2. PAF (paroxysmal atrial fibrillation)  (HCC) Currently anticoagulated with Coumadin, last INR was 2.6 this month - metoprolol tartrate (LOPRESSOR) 25 MG tablet; Take 0.5 tablets (12.5 mg total) by mouth 2 (two) times daily.  Dispense: 30 tablet; Refill: 5  3. Pain of both hip joints Chronic pain She uses tramadol as needed Will need to evaluate for underlying osteoarthritis - DG Pelvis 1-2 Views; Future  4. Tobacco abuse Placed on Wellbutrin  Meds ordered this encounter  Medications  . lisinopril (PRINIVIL,ZESTRIL) 5 MG tablet    Sig: TAKE ONE TABLET BY MOUTH ONCE DAILY (DISCONTINUE PREVIOUS DOSE)    Dispense:  30 tablet    Refill:  5    Please consider 90 day supplies to promote better adherence  . metoprolol tartrate (LOPRESSOR) 25 MG tablet    Sig: Take 0.5 tablets (12.5 mg total) by mouth 2 (two) times daily.    Dispense:  30 tablet    Refill:  5    Must have office visit for refills!    Follow-up: Return in about 1 month (around 05/11/2017) for Complete physical exam.   This note has been created with Education officer, environmental. Any transcriptional errors are unintentional.     Jaclyn Shaggy MD

## 2017-04-10 NOTE — Patient Instructions (Signed)
Hip Pain The hip is the joint between the upper legs and the lower pelvis. The bones, cartilage, tendons, and muscles of your hip joint support your body and allow you to move around. Hip pain can range from a minor ache to severe pain in one or both of your hips. The pain may be felt on the inside of the hip joint near the groin, or the outside near the buttocks and upper thigh. You may also have swelling or stiffness. Follow these instructions at home: Managing pain, stiffness, and swelling   If directed, apply ice to the injured area.  Put ice in a plastic bag.  Place a towel between your skin and the bag.  Leave the ice on for 20 minutes, 2-3 times a day  Sleep with a pillow between your legs on your most comfortable side.  Avoid any activities that cause pain. General instructions   Take over-the-counter and prescription medicines only as told by your health care provider.  Do any exercises as told by your health care provider.  Record the following:  How often you have hip pain.  The location of your pain.  What the pain feels like.  What makes the pain worse.  Keep all follow-up visits as told by your health care provider. This is important. Contact a health care provider if:  You cannot put weight on your leg.  Your pain or swelling continues or gets worse after one week.  It gets harder to walk.  You have a fever. Get help right away if:  You fall.  You have a sudden increase in pain and swelling in your hip.  Your hip is red or swollen or very tender to touch. Summary  Hip pain can range from a minor ache to severe pain in one or both of your hips.  The pain may be felt on the inside of the hip joint near the groin, or the outside near the buttocks and upper thigh.  Avoid any activities that cause pain.  Record how often you have hip pain, the location of the pain, what makes it worse and what it feels like. This information is not intended to  replace advice given to you by your health care provider. Make sure you discuss any questions you have with your health care provider. Document Released: 02/14/2010 Document Revised: 07/30/2016 Document Reviewed: 07/30/2016 Elsevier Interactive Patient Education  2017 [employer]  

## 2017-04-18 ENCOUNTER — Other Ambulatory Visit: Payer: Self-pay | Admitting: Cardiology

## 2017-04-19 ENCOUNTER — Encounter: Payer: Self-pay | Admitting: Cardiology

## 2017-04-19 MED ORDER — WARFARIN SODIUM 1 MG PO TABS: ORAL_TABLET | ORAL | 3 refills | Status: DC

## 2017-04-20 ENCOUNTER — Encounter: Payer: Self-pay | Admitting: Family Medicine

## 2017-04-22 ENCOUNTER — Ambulatory Visit: Payer: Self-pay | Admitting: Thoracic Surgery (Cardiothoracic Vascular Surgery)

## 2017-05-05 ENCOUNTER — Encounter: Payer: Self-pay | Admitting: Family Medicine

## 2017-05-06 ENCOUNTER — Other Ambulatory Visit: Payer: Self-pay | Admitting: Family Medicine

## 2017-05-06 DIAGNOSIS — I480 Paroxysmal atrial fibrillation: Secondary | ICD-10-CM

## 2017-05-06 MED ORDER — METOPROLOL TARTRATE 25 MG PO TABS: 12.5000 mg | ORAL_TABLET | Freq: Two times a day (BID) | ORAL | 5 refills | Status: DC

## 2017-05-06 NOTE — Telephone Encounter (Signed)
Refill request

## 2017-05-10 ENCOUNTER — Ambulatory Visit (INDEPENDENT_AMBULATORY_CARE_PROVIDER_SITE_OTHER): Payer: Self-pay

## 2017-05-10 DIAGNOSIS — I359 Nonrheumatic aortic valve disorder, unspecified: Secondary | ICD-10-CM

## 2017-05-10 DIAGNOSIS — I480 Paroxysmal atrial fibrillation: Secondary | ICD-10-CM

## 2017-05-10 DIAGNOSIS — I4891 Unspecified atrial fibrillation: Secondary | ICD-10-CM

## 2017-05-10 DIAGNOSIS — Z9889 Other specified postprocedural states: Secondary | ICD-10-CM

## 2017-05-10 DIAGNOSIS — Z954 Presence of other heart-valve replacement: Secondary | ICD-10-CM

## 2017-05-10 DIAGNOSIS — Z8774 Personal history of (corrected) congenital malformations of heart and circulatory system: Secondary | ICD-10-CM

## 2017-05-10 DIAGNOSIS — Z8679 Personal history of other diseases of the circulatory system: Secondary | ICD-10-CM

## 2017-05-10 DIAGNOSIS — I4892 Unspecified atrial flutter: Secondary | ICD-10-CM

## 2017-05-10 LAB — POCT INR: INR: 3.4

## 2017-05-20 ENCOUNTER — Ambulatory Visit: Payer: Self-pay | Admitting: Thoracic Surgery (Cardiothoracic Vascular Surgery)

## 2017-06-17 ENCOUNTER — Encounter: Payer: Self-pay | Admitting: Thoracic Surgery (Cardiothoracic Vascular Surgery)

## 2017-06-17 ENCOUNTER — Ambulatory Visit (INDEPENDENT_AMBULATORY_CARE_PROVIDER_SITE_OTHER): Payer: Self-pay | Admitting: Thoracic Surgery (Cardiothoracic Vascular Surgery)

## 2017-06-17 VITALS — BP 109/74 | HR 73 | Ht 63.0 in | Wt 108.0 lb

## 2017-06-17 DIAGNOSIS — I4819 Other persistent atrial fibrillation: Secondary | ICD-10-CM

## 2017-06-17 DIAGNOSIS — Z8774 Personal history of (corrected) congenital malformations of heart and circulatory system: Secondary | ICD-10-CM

## 2017-06-17 DIAGNOSIS — I351 Nonrheumatic aortic (valve) insufficiency: Secondary | ICD-10-CM

## 2017-06-17 DIAGNOSIS — Z954 Presence of other heart-valve replacement: Secondary | ICD-10-CM

## 2017-06-17 DIAGNOSIS — I481 Persistent atrial fibrillation: Secondary | ICD-10-CM

## 2017-06-17 DIAGNOSIS — Q244 Congenital subaortic stenosis: Secondary | ICD-10-CM

## 2017-06-17 DIAGNOSIS — Z9889 Other specified postprocedural states: Secondary | ICD-10-CM

## 2017-06-17 DIAGNOSIS — Z8679 Personal history of other diseases of the circulatory system: Secondary | ICD-10-CM

## 2017-06-17 NOTE — Progress Notes (Signed)
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 She was last seen here in our office on 06/04/2016 at which time she was doing very well. Since then she has been seen in follow-up by Dr. Delton See, most recently on 08/30/2016.  She returns to our office today and reports that she continues to do exceptionally well. She is reasonably active physically although she admits that she does not exercise on a regular basis. She states that she only gets short of breath with strenuous exertion and this does not limit her daily activities to any significant degree. Overall she feels dramatically better than she did prior to her surgery. She has not been having any chest pain, palpitations, or other symptoms to suggest a recurrence of atrial fibrillation. She remains anticoagulated using warfarin and has not had any bleeding complications or other significant problems. Overall she is delighted with her progress.   Current Outpatient Prescriptions  Medication Sig Dispense Refill  . albuterol (PROVENTIL HFA;VENTOLIN HFA) 108 (90 Base) MCG/ACT inhaler Inhale 2 puffs into the lungs every 6 (six) hours as needed for wheezing or shortness of breath. 1 Inhaler 2  . buPROPion (WELLBUTRIN SR) 150 MG 12 hr tablet Take 1 tablet (150 mg total) by mouth 2 (two) times daily. For smoking cessation 60 tablet 2  . lisinopril (PRINIVIL,ZESTRIL) 5 MG tablet TAKE ONE TABLET BY MOUTH ONCE DAILY (DISCONTINUE PREVIOUS DOSE) 30 tablet 5  . metoprolol tartrate (LOPRESSOR)  25 MG tablet Take 0.5 tablets (12.5 mg total) by mouth 2 (two) times daily. 30 tablet 5  . warfarin (COUMADIN) 1 MG tablet TAKE AS DIRECTED BY COUMADIN CLINIC 40 tablet 3   No current facility-administered medications for this visit.       Physical Exam:   BP 109/74   Pulse 73   Ht  (1.6 m)   Wt 108 lb (49 kg)   SpO2 99%   BMI 19.13 kg/m   General:  Well-appearing  Chest:   Clear to auscultation  CV:   Regular rate and rhythm with mechanical heart valve sounds, soft systolic murmur heard along the right sternal border  Incisions:  Completely healed, sternum is stable  Abdomen:  Soft nontender  Extremities:  Warm and well-perfused  Diagnostic Tests:  Transthoracic Echocardiography  Patient:    Brinkley, Peet MR #:       [id number] Study Date: 05/07/2016 Gender:     F Age:        45 Height:     160 cm Weight:     49.1 kg BSA:        1.47 m^2 Pt. Status: Room:   ORDERING     Nicolasa Ducking, MD  REFERRING    Nicolasa Ducking, MD  ATTENDING    Donato Schultz, M.D.  SONOGRAPHER  Aida Raider, RDCS  PERFORMING   [facility] Outpatient  cc:  ------------------------------------------------------------------- LV EF: 50% -   55%  ------------------------------------------------------------------- Indications:  I42.9 Cardiomyopathy.  I35.9 Aortic Valve Disorder.  ------------------------------------------------------------------- History:   PMH:  Acquired from the patient and from the patient&'s chart.  PMH:  History of severe aortic insufficiency, subaortic stenosis, paroxysmal atrial fibrillation. Combined CHF. COPD. Nonischemic cardiomyopathy.  ------------------------------------------------------------------- Study Conclusions  - Left ventricle: The cavity size was normal. Wall thickness was   normal. Systolic function was normal. The estimated ejection   fraction was in the range of 50% to 55%. Wall motion was normal;   there were no  regional wall motion abnormalities. Features are   consistent with a pseudonormal left ventricular filling pattern,   with concomitant abnormal relaxation and increased filling   pressure (grade 2 diastolic dysfunction). - Aortic valve: A mechanical prosthesis was present and functioning   normally. Peak velocity (S): 244 cm/s. - Left atrium: The atrium was mildly dilated. Anterior-posterior   dimension: 43 mm. Volume/bsa, ES, (1-plane Simpson&'s, A2C): 32.6   ml/m^2.  Impressions:  - When compared to prior, EF is now normal.  ------------------------------------------------------------------- Labs, prior tests, procedures, and surgery: Status post Aortic Valve Replacement-68mm Sorin Landscape architect Valve.  ------------------------------------------------------------------- Study data:   Study status:  Routine.  Procedure:  The patient reported no pain pre or post test. Transthoracic echocardiography for left ventricular function evaluation, for right ventricular function evaluation, and for assessment of valvular function. Image quality was adequate.  Study completion:  There were no complications.          Transthoracic echocardiography.  M-mode, complete 2D, spectral Doppler, and color Doppler.  Birthdate: Patient birthdate: 30-May-1971.  Age:  Patient is 46 yr old.  Sex: Gender: female.    BMI: 19.2 kg/m^2.  Blood pressure:     101/62 Patient status:  Outpatient.  Study date:  Study date: 05/07/2016. Study time: 10:41 AM.  Location:  [facility]  -------------------------------------------------------------------  ------------------------------------------------------------------- Left ventricle:  The cavity size was normal. Wall thickness was normal. Systolic function was normal. The estimated ejection fraction was in the range of 50% to 55%. Wall motion was normal; there were no regional wall motion abnormalities. Features  are consistent with a pseudonormal left ventricular filling pattern, with concomitant abnormal relaxation and increased filling pressure (grade 2 diastolic dysfunction).  ------------------------------------------------------------------- Aortic valve:  A mechanical prosthesis was present and functioning normally. Mobility was not restricted.  Doppler:  Transvalvular velocity was within the normal range. There was no stenosis. There was no regurgitation.    VTI ratio of LVOT to aortic valve: 0.5. Peak velocity ratio of LVOT to aortic valve: 0.44. Mean velocity ratio of LVOT to aortic valve: 0.48.    Mean gradient (S): 12 mm Hg. Peak gradient (S): 24 mm Hg.  ------------------------------------------------------------------- Aorta:  Aortic root: The aortic root was normal in size.  ------------------------------------------------------------------- Mitral valve:   Mildly thickened leaflets . Mobility was not restricted.  Doppler:  Transvalvular velocity was within the normal range. There was no evidence for stenosis. There was no regurgitation.    Peak gradient (D): 4 mm Hg.  ------------------------------------------------------------------- Left atrium:  The atrium was mildly dilated.  ------------------------------------------------------------------- Right ventricle:  The cavity size was normal. Wall thickness was normal. Systolic function was normal.  ------------------------------------------------------------------- Pulmonic valve:   Poorly visualized.  Structurally normal valve. Cusp separation was normal.  Doppler:  Transvalvular velocity was within the normal range. There was no evidence for stenosis. There was no regurgitation.  ------------------------------------------------------------------- Tricuspid valve:   Structurally normal valve.    Doppler: Transvalvular velocity was within  the normal range. There was  no regurgitation.  ------------------------------------------------------------------- Pulmonary artery:   The main pulmonary artery was normal-sized. Systolic pressure was within the normal range.  ------------------------------------------------------------------- Right atrium:  The atrium was normal in size.  ------------------------------------------------------------------- Pericardium:  There was no pericardial effusion.  ------------------------------------------------------------------- Systemic veins: Inferior vena cava: The vessel was normal in size.  ------------------------------------------------------------------- Measurements   Left ventricle                         Value        Reference  LV ID, ED, PLAX chordal                44    mm     43 - 52  LV ID, ES, PLAX chordal                30    mm     23 - 38  LV fx shortening, PLAX chordal         32    %      >=29  LV PW thickness, ED                    9.53  mm     ---------  IVS/LV PW ratio, ED                    0.99         <=1.3  LV e&', lateral                         9.76  cm/s   ---------  LV E/e&', lateral                       10.76        ---------  LV e&', medial                          7.02  cm/s   ---------  LV E/e&', medial                        14.96        ---------  LV e&', average                         8.39  cm/s   ---------  LV E/e&', average                       12.51        ---------    Ventricular septum                     Value        Reference  IVS thickness, ED                      9.48  mm     ---------    LVOT                                   Value        Reference  LVOT peak velocity, S  107   cm/s   ---------  LVOT mean velocity, S                  78    cm/s   ---------  LVOT VTI, S                            23.1  cm     ---------  LVOT peak gradient, S                  5     mm Hg  ---------    Aortic valve                           Value         Reference  Aortic valve peak velocity, S          244   cm/s   ---------  Aortic valve mean velocity, S          162   cm/s   ---------  Aortic valve VTI, S                    46.2  cm     ---------  Aortic mean gradient, S                12    mm Hg  ---------  Aortic peak gradient, S                24    mm Hg  ---------  VTI ratio, LVOT/AV                     0.5          ---------  Velocity ratio, peak, LVOT/AV          0.44         ---------  Velocity ratio, mean, LVOT/AV          0.48         ---------    Aorta                                  Value        Reference  Aortic root ID, ED                     32    mm     ---------    Left atrium                            Value        Reference  LA ID, A-P, ES                         43    mm     ---------  LA ID/bsa, A-P                 (H)     2.92  cm/m^2 <=2.2  LA volume, S                           50    ml     ---------  LA volume/bsa, S  34    ml/m^2 ---------  LA volume, ES, 1-p A4C                 51    ml     ---------  LA volume/bsa, ES, 1-p A4C             34.6  ml/m^2 ---------  LA volume, ES, 1-p A2C                 48    ml     ---------  LA volume/bsa, ES, 1-p A2C             32.6  ml/m^2 ---------    Mitral valve                           Value        Reference  Mitral E-wave peak velocity            105   cm/s   ---------  Mitral A-wave peak velocity            38.9  cm/s   ---------  Mitral deceleration time               162   ms     150 - 230  Mitral peak gradient, D                4     mm Hg  ---------  Mitral E/A ratio, peak                 2.7          ---------    Right ventricle                        Value        Reference  RV s&', lateral, S                      11    cm/s   ---------  Legend: (L)  and  (H)  mark values outside specified reference range.  ------------------------------------------------------------------- Prepared and Electronically Authenticated by  Donato Schultz, M.D. 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  She has been maintaining sinus rhythm and overall getting along quite well.  Plan:  We have not recommended any changes in the patient's current medications.  The patient has been reminded regarding the importance of dental hygiene and the lifelong need for antibiotic prophylaxis for all dental cleanings and other related invasive procedures.  She will continue to follow up periodically with Dr. Delton See and in the atrial fibrillation clinic for surveillance following Maze procedure. She'll return to our office in the future only should specific problems or questions arise.  I spent in excess of 10 minutes during the conduct of this office consultation and >50% of this time involved direct face-to-face encounter with the patient for counseling and/or coordination of their care.  Salvatore Decent. Cornelius Moras, MD 06/17/2017 4:54 PM

## 2017-06-17 NOTE — Patient Instructions (Signed)

## 2017-06-21 ENCOUNTER — Ambulatory Visit (INDEPENDENT_AMBULATORY_CARE_PROVIDER_SITE_OTHER): Payer: Self-pay | Admitting: *Deleted

## 2017-06-21 DIAGNOSIS — I359 Nonrheumatic aortic valve disorder, unspecified: Secondary | ICD-10-CM

## 2017-06-21 DIAGNOSIS — Z9889 Other specified postprocedural states: Secondary | ICD-10-CM

## 2017-06-21 DIAGNOSIS — Z8774 Personal history of (corrected) congenital malformations of heart and circulatory system: Secondary | ICD-10-CM

## 2017-06-21 DIAGNOSIS — Z954 Presence of other heart-valve replacement: Secondary | ICD-10-CM

## 2017-06-21 DIAGNOSIS — Z8679 Personal history of other diseases of the circulatory system: Secondary | ICD-10-CM

## 2017-06-21 DIAGNOSIS — I4891 Unspecified atrial fibrillation: Secondary | ICD-10-CM

## 2017-06-21 DIAGNOSIS — I4892 Unspecified atrial flutter: Secondary | ICD-10-CM

## 2017-06-21 DIAGNOSIS — I480 Paroxysmal atrial fibrillation: Secondary | ICD-10-CM

## 2017-06-21 DIAGNOSIS — Z5181 Encounter for therapeutic drug level monitoring: Secondary | ICD-10-CM

## 2017-06-21 LAB — POCT INR: INR: 1.5

## 2017-07-01 ENCOUNTER — Ambulatory Visit (INDEPENDENT_AMBULATORY_CARE_PROVIDER_SITE_OTHER): Payer: Self-pay

## 2017-07-01 DIAGNOSIS — I359 Nonrheumatic aortic valve disorder, unspecified: Secondary | ICD-10-CM

## 2017-07-01 DIAGNOSIS — Z954 Presence of other heart-valve replacement: Secondary | ICD-10-CM

## 2017-07-01 DIAGNOSIS — I4892 Unspecified atrial flutter: Secondary | ICD-10-CM

## 2017-07-01 DIAGNOSIS — I4891 Unspecified atrial fibrillation: Secondary | ICD-10-CM

## 2017-07-01 DIAGNOSIS — I480 Paroxysmal atrial fibrillation: Secondary | ICD-10-CM

## 2017-07-01 DIAGNOSIS — Z9889 Other specified postprocedural states: Secondary | ICD-10-CM

## 2017-07-01 DIAGNOSIS — Z5181 Encounter for therapeutic drug level monitoring: Secondary | ICD-10-CM

## 2017-07-01 DIAGNOSIS — Z8679 Personal history of other diseases of the circulatory system: Secondary | ICD-10-CM

## 2017-07-01 DIAGNOSIS — Z8774 Personal history of (corrected) congenital malformations of heart and circulatory system: Secondary | ICD-10-CM

## 2017-07-01 LAB — POCT INR: INR: 3.1

## 2017-07-22 ENCOUNTER — Ambulatory Visit (INDEPENDENT_AMBULATORY_CARE_PROVIDER_SITE_OTHER): Payer: PRIVATE HEALTH INSURANCE | Admitting: *Deleted

## 2017-07-22 DIAGNOSIS — I480 Paroxysmal atrial fibrillation: Secondary | ICD-10-CM | POA: Diagnosis not present

## 2017-07-22 DIAGNOSIS — Z954 Presence of other heart-valve replacement: Secondary | ICD-10-CM | POA: Diagnosis not present

## 2017-07-22 DIAGNOSIS — I359 Nonrheumatic aortic valve disorder, unspecified: Secondary | ICD-10-CM | POA: Diagnosis not present

## 2017-07-22 DIAGNOSIS — Z9889 Other specified postprocedural states: Secondary | ICD-10-CM | POA: Diagnosis not present

## 2017-07-22 DIAGNOSIS — Z8679 Personal history of other diseases of the circulatory system: Secondary | ICD-10-CM | POA: Diagnosis not present

## 2017-07-22 DIAGNOSIS — I4891 Unspecified atrial fibrillation: Secondary | ICD-10-CM

## 2017-07-22 DIAGNOSIS — Z5181 Encounter for therapeutic drug level monitoring: Secondary | ICD-10-CM | POA: Diagnosis not present

## 2017-07-22 DIAGNOSIS — I4892 Unspecified atrial flutter: Secondary | ICD-10-CM | POA: Diagnosis not present

## 2017-07-22 DIAGNOSIS — Z8774 Personal history of (corrected) congenital malformations of heart and circulatory system: Secondary | ICD-10-CM

## 2017-07-22 LAB — POCT INR: INR: 2.9

## 2017-07-22 NOTE — Progress Notes (Signed)
Continue on same dose 1.5 tablets (1.5mg )  every day except 1 tablet (1mg ) on Sundays, Tuesdays and Thursdays. Recheck INR in 4 weeks.  Call with any changes [phone].

## 2017-08-07 ENCOUNTER — Encounter: Payer: Self-pay | Admitting: Physician Assistant

## 2017-08-07 ENCOUNTER — Ambulatory Visit: Payer: PRIVATE HEALTH INSURANCE | Attending: Family Medicine | Admitting: Physician Assistant

## 2017-08-07 VITALS — BP 105/70 | HR 97 | Temp 98.2°F | Resp 16 | Wt 112.8 lb

## 2017-08-07 DIAGNOSIS — Z952 Presence of prosthetic heart valve: Secondary | ICD-10-CM | POA: Diagnosis not present

## 2017-08-07 DIAGNOSIS — I110 Hypertensive heart disease with heart failure: Secondary | ICD-10-CM | POA: Diagnosis not present

## 2017-08-07 DIAGNOSIS — R202 Paresthesia of skin: Secondary | ICD-10-CM | POA: Diagnosis not present

## 2017-08-07 DIAGNOSIS — Z7901 Long term (current) use of anticoagulants: Secondary | ICD-10-CM | POA: Insufficient documentation

## 2017-08-07 DIAGNOSIS — R739 Hyperglycemia, unspecified: Secondary | ICD-10-CM

## 2017-08-07 DIAGNOSIS — F419 Anxiety disorder, unspecified: Secondary | ICD-10-CM | POA: Diagnosis not present

## 2017-08-07 DIAGNOSIS — J449 Chronic obstructive pulmonary disease, unspecified: Secondary | ICD-10-CM | POA: Insufficient documentation

## 2017-08-07 DIAGNOSIS — Z9889 Other specified postprocedural states: Secondary | ICD-10-CM | POA: Insufficient documentation

## 2017-08-07 DIAGNOSIS — M542 Cervicalgia: Secondary | ICD-10-CM

## 2017-08-07 DIAGNOSIS — I480 Paroxysmal atrial fibrillation: Secondary | ICD-10-CM | POA: Insufficient documentation

## 2017-08-07 DIAGNOSIS — I2721 Secondary pulmonary arterial hypertension: Secondary | ICD-10-CM | POA: Diagnosis not present

## 2017-08-07 DIAGNOSIS — I351 Nonrheumatic aortic (valve) insufficiency: Secondary | ICD-10-CM | POA: Insufficient documentation

## 2017-08-07 DIAGNOSIS — K766 Portal hypertension: Secondary | ICD-10-CM | POA: Insufficient documentation

## 2017-08-07 DIAGNOSIS — E1165 Type 2 diabetes mellitus with hyperglycemia: Secondary | ICD-10-CM | POA: Diagnosis not present

## 2017-08-07 DIAGNOSIS — I5042 Chronic combined systolic (congestive) and diastolic (congestive) heart failure: Secondary | ICD-10-CM | POA: Diagnosis not present

## 2017-08-07 DIAGNOSIS — Z79899 Other long term (current) drug therapy: Secondary | ICD-10-CM | POA: Insufficient documentation

## 2017-08-07 NOTE — Progress Notes (Signed)
Pt  C/o neck shoulder pain. Referred from chiroprator for Xrays

## 2017-08-07 NOTE — Progress Notes (Signed)
Patient ID: Sheila Branch, female   DOB: 09/04/1971, 46 y.o.   MRN: [id number]   Sheila Branch, is a 46 y.o. female  QZR:[id number]  [id number]  DOB - 1970-11-19  Subjective:  Chief Complaint and HPI: Sheila Branch is a 46 y.o. female here today for ongoing neck stiffness and neck pain for about 20 years.  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  She went to the chiropractor and he wanted xrays before he was willing to do any manipulation.  No UE weakness.    She is followed by the coumadin clinic and is UTD on appts-due again in 2 more weeks.  Also c/o tingling in B hands and feet X 2 weeks.  wants to be screened for diabetes and thryoid issues.    ROS:   Constitutional:  No f/c, No night sweats, No unexplained weight loss. EENT:  No vision changes, No blurry vision, No hearing changes. No mouth, throat, or ear problems.  Respiratory: No cough, No SOB Cardiac: No CP, no palpitations GI:  No abd pain, No N/V/D. GU: No Urinary s/sx Musculoskeletal: +neck stiffness.  Neuro: No headache, no dizziness, no motor weakness.  Skin: No rash Endocrine:  No polydipsia. No polyuria.  Psych: Denies SI/HI  No problems updated.  ALLERGIES: Allergies  Allergen Reactions  . Penicillins Cross Reactors Swelling and Other (See Comments)    Mouth Ulcers Has patient had a PCN reaction causing immediate rash, facial/tongue/throat swelling, SOB or lightheadedness with hypotension: Yes Has patient had a PCN reaction causing severe rash involving mucus membranes or skin necrosis: Yes Has patient had a PCN reaction that required hospitalization No Has patient had a PCN reaction occurring within the last 10 years: No If all of the above answers are "NO", then may proceed with Cephalosporin use.     PAST MEDICAL HISTORY: Past Medical History:  Diagnosis Date  . Anxiety   . Aortic insufficiency      a. 10/2015 Echo: mod-sev AI;  b. 02/09/2016 23 mm Sorin [employer]omed[employer] Top Hat bileaflet mechanical valve -->anticoagulated w/ coumadin;  c. 02/17/2016 Echo: EF 25-30%, mod AS, mild AI, mild MR.  . CHD (congenital heart disease)    a. PDA s/p ligation 02/09/16.  Marland Kitchen Chronic combined systolic and diastolic congestive heart failure (HCC)    a. 10/2015 Echo: EF 50-55%;  b. 02/17/2016 Echo (post-op): EF 25-30%, anteroseptal AK.  Marland Kitchen COPD (chronic obstructive pulmonary disease) (HCC)    a. Remote tobacco abuse - quit 10/2015.  Marland Kitchen History of pneumonia   . NICM (nonischemic cardiomyopathy) (HCC)    a. 10/2015 Echo: EF 50-55%;  b. 01/2016 Cath: nl cors;  c. 02/17/2016 (post-op): EF 25-30%, anteroseptal AK.  Marland Kitchen PAF (paroxysmal atrial fibrillation) (HCC) 01/08/2016   a. 10/2015 s/p TEE & DCCV;  b. 12/2015 recurrent AF-->Amio-->converted to sinus.  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 01/2016 RHC: PA 90/44.  Marland Kitchen Prolonged QT interval    a. Stable @ ~ 508 msec on amio 200mg  daily.  . 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 02/09/2016 s/p 23 mm Sorin Carbomedics Top Hat bileaflet mechanical valve -->anticoagulated w/ coumadin.  . S/P repair of patent ductus arteriosus 02/09/2016  . Subvalvar aortic stenosis    a. 02/09/2016 s/p resection of subvalvular Ao membrane.    MEDICATIONS AT  HOME: Prior to Admission medications   Medication Sig Start Date End Date Taking? Authorizing Provider  albuterol (PROVENTIL HFA;VENTOLIN HFA) 108 (90 Base) MCG/ACT inhaler Inhale 2 puffs into the lungs every 6 (six) hours as needed for wheezing or shortness of breath. 06/11/16  Yes Amao, Odette HornsEnobong, MD  lisinopril (PRINIVIL,ZESTRIL) 5 MG tablet TAKE ONE TABLET BY MOUTH ONCE DAILY (DISCONTINUE PREVIOUS DOSE) 04/10/17  Yes Jaclyn ShaggyAmao, Enobong, MD  metoprolol tartrate (LOPRESSOR) 25 MG tablet Take 0.5 tablets (12.5 mg total) by mouth 2 (two) times daily. 05/06/17  Yes Jaclyn ShaggyAmao,  Enobong, MD  warfarin (COUMADIN) 1 MG tablet TAKE AS DIRECTED BY COUMADIN CLINIC 04/19/17  Yes Lars MassonNelson, Katarina H, MD  buPROPion [facility](WELLBUTRIN SR) 150 MG 12 hr tablet Take 1 tablet (150 mg total) by mouth 2 (two) times daily. For smoking cessation Patient not taking: Reported on 08/07/2017 04/10/17   Jaclyn ShaggyAmao, Enobong, MD     Objective:  EXAM:   Vitals:   08/07/17 1642  BP: 105/70  Pulse: 97  Resp: 16  Temp: 98.2 F (36.8 C)  SpO2: 100%  Weight: 112 lb 12.8 oz (51.2 kg)    General appearance : A&OX3. NAD. Non-toxic-appearing HEENT: Atraumatic and Normocephalic.  PERRLA. EOM intact.  Neck: supple, no JVD. No cervical lymphadenopathy. No thyromegaly.  ROM ~75% of normal Chest/Lungs:  Breathing-non-labored, Good air entry bilaterally, breath sounds normal without rales, rhonchi, or wheezing  CVS: S1 S2 regular, no murmurs, gallops, rubs  Extremities: Bilateral U& Lower Ext shows full S&ROM, no edema, both legs are warm to touch with = pulse throughout.  DTR U&LE = intact B Neurology:  CN II-XII grossly intact, Non focal.   Psych:  TP linear. J/I WNL. Normal speech. Appropriate eye contact and affect.  Skin:  No Rash  Data Review Lab Results  Component Value Date   HGBA1C 6.1 (H) 01/12/2016   HGBA1C 5.9 (H) 11/04/2015     Assessment & Plan   1. Paresthesias No red flags - Basic metabolic panel - TSH  2. Hyperglycemia - HgB A1c  3. Neck pain - DG Cervical Spine 2 or 3 views  4.  Htn-controlled.  Continue current regimen!   Patient have been counseled extensively about nutrition and exercise  Return in about 3 months (around 11/07/2017) for Dr Alvis LemmingsNewlin for general chekc up.  The patient was given clear instructions to go to ER or return to medical center if symptoms don't improve, worsen or new problems develop. The patient verbalized understanding. The patient was told to call to get lab results if they haven't heard anything in the next week.     Georgian CoAngela McClung,  PA-C [facility] [place], [place] [phone]   08/07/2017, 4:56 PM

## 2017-08-08 LAB — BASIC METABOLIC PANEL
BUN / CREAT RATIO: 19 (ref 9–23)
BUN: 13 mg/dL (ref 6–24)
CHLORIDE: 103 mmol/L (ref 96–106)
CO2: 26 mmol/L (ref 20–29)
CREATININE: 0.68 mg/dL (ref 0.57–1.00)
Calcium: 9.1 mg/dL (ref 8.7–10.2)
GFR calc Af Amer: 122 mL/min/{1.73_m2} (ref >59)
GFR calc non Af Amer: 106 mL/min/{1.73_m2} (ref >59)
GLUCOSE: 86 mg/dL (ref 65–99)
POTASSIUM: 3.9 mmol/L (ref 3.5–5.2)
SODIUM: 140 mmol/L (ref 134–144)

## 2017-08-08 LAB — TSH: TSH: 2.74 u[IU]/mL (ref 0.450–4.500)

## 2017-08-09 ENCOUNTER — Ambulatory Visit (HOSPITAL_COMMUNITY)
Admission: RE | Admit: 2017-08-09 | Discharge: 2017-08-09 | Disposition: A | Discharge status: Home / Self Care | Payer: PRIVATE HEALTH INSURANCE | Source: Ambulatory Visit | Attending: Physician Assistant | Admitting: Physician Assistant

## 2017-08-09 ENCOUNTER — Ambulatory Visit (HOSPITAL_COMMUNITY)
Admission: RE | Admit: 2017-08-09 | Discharge: 2017-08-09 | Disposition: A | Discharge status: Home / Self Care | Payer: PRIVATE HEALTH INSURANCE | Source: Ambulatory Visit | Attending: Family Medicine | Admitting: Family Medicine

## 2017-08-09 DIAGNOSIS — M47812 Spondylosis without myelopathy or radiculopathy, cervical region: Secondary | ICD-10-CM | POA: Diagnosis not present

## 2017-08-09 DIAGNOSIS — M25552 Pain in left hip: Principal | ICD-10-CM

## 2017-08-09 DIAGNOSIS — M25551 Pain in right hip: Secondary | ICD-10-CM | POA: Insufficient documentation

## 2017-08-09 DIAGNOSIS — M542 Cervicalgia: Secondary | ICD-10-CM | POA: Diagnosis present

## 2017-08-12 ENCOUNTER — Telehealth: Payer: Self-pay | Admitting: *Deleted

## 2017-08-12 NOTE — Telephone Encounter (Addendum)
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 Would you like to be referred for physical therapy or are you going to pursue treatment through the chiropractor? Either might be beneficial to improve your range of motion and decrease stiffness/soreness. Thanks, Georgian Co, PA-C   Your labs look great! There is no diabetes. Your electrolytes and kidney function are normal. Your thyroid function is normal. I am awaiting your xray results.  Thanks,  Georgian Co, PA-C    Left message on voicemail for patient to return call.

## 2017-08-16 NOTE — Telephone Encounter (Signed)
Unable to disclose result. Left message to return call.

## 2017-08-23 ENCOUNTER — Other Ambulatory Visit: Payer: Self-pay | Admitting: Cardiology

## 2017-08-28 ENCOUNTER — Encounter: Payer: Self-pay | Admitting: Family Medicine

## 2017-09-04 ENCOUNTER — Ambulatory Visit (INDEPENDENT_AMBULATORY_CARE_PROVIDER_SITE_OTHER): Payer: PRIVATE HEALTH INSURANCE | Admitting: *Deleted

## 2017-09-04 DIAGNOSIS — I4892 Unspecified atrial flutter: Secondary | ICD-10-CM | POA: Diagnosis not present

## 2017-09-04 DIAGNOSIS — Z8774 Personal history of (corrected) congenital malformations of heart and circulatory system: Secondary | ICD-10-CM

## 2017-09-04 DIAGNOSIS — Z9889 Other specified postprocedural states: Secondary | ICD-10-CM

## 2017-09-04 DIAGNOSIS — I359 Nonrheumatic aortic valve disorder, unspecified: Secondary | ICD-10-CM | POA: Diagnosis not present

## 2017-09-04 DIAGNOSIS — I4891 Unspecified atrial fibrillation: Secondary | ICD-10-CM

## 2017-09-04 DIAGNOSIS — Z5181 Encounter for therapeutic drug level monitoring: Secondary | ICD-10-CM | POA: Diagnosis not present

## 2017-09-04 DIAGNOSIS — I480 Paroxysmal atrial fibrillation: Secondary | ICD-10-CM | POA: Diagnosis not present

## 2017-09-04 DIAGNOSIS — Z954 Presence of other heart-valve replacement: Secondary | ICD-10-CM | POA: Diagnosis not present

## 2017-09-04 DIAGNOSIS — Z8679 Personal history of other diseases of the circulatory system: Secondary | ICD-10-CM

## 2017-09-04 LAB — POCT INR: INR: 2.9

## 2017-09-04 NOTE — Patient Instructions (Signed)
Description   Continue on same dose 1.5 tablets (1.5mg)  every day except 1 tablet (1mg) on Sundays, Tuesdays and Thursdays. Recheck INR in 4 weeks.  Call with any changes [phone].      

## 2017-09-11 ENCOUNTER — Telehealth: Payer: Self-pay | Admitting: Cardiology

## 2017-09-11 DIAGNOSIS — I480 Paroxysmal atrial fibrillation: Secondary | ICD-10-CM

## 2017-09-11 DIAGNOSIS — I4891 Unspecified atrial fibrillation: Secondary | ICD-10-CM

## 2017-09-11 NOTE — Telephone Encounter (Signed)
Pts 48 hour holter monitor is scheduled for 09/12/17 at 0830.  Pt made aware of appt date and time by [facility] scheduling.

## 2017-09-11 NOTE — Telephone Encounter (Signed)
Spoke with the pt and informed her that per Dr Delton See, she has no CAD, and she recommends that it if reoccurs, she would have a 48 hr holter monitor placed to eval for a-fib reoccurrence.  Per the pt, she would like to go ahead and proceed with getting the 48 hr holter placed.  Informed the pt that I will place the order in the system and send a message to our [facility] Schedulers to call her back to arrange this appt.  Pt verbalized understanding and agrees with this plan.

## 2017-09-11 NOTE — Telephone Encounter (Signed)
She has no CAD, if it recurs I would give her 48 hours Holter to evaluate for a-fin recurrence.

## 2017-09-11 NOTE — Telephone Encounter (Signed)
Pt calling to inform Dr Delton See that when she was driving to work this morning, she had an episode of what felt like 10-15 mins worth of "heart burning." Pt states that during this sensation, she then became flushed and had a "hot flash." Pt states her apple watch said her HR was 90 bpm.  Pt states she had no SOB, chest pressure, pain, or radiating pain. Pt states just burning and hot flash occurred.  Pt states this scared her because this is the way she felt prior to having her valve replaced, and prior to her last cardioversion.  Pt states her HR is regular and she is having NO palpitations.  Pt states she is under a lot of stress, for she is moving out of a hotel room and into her finished apartment.  Pt states now that she's sitting at home, for she didn't go into work, she is asymptomatic at the time, and her resting HR is 80 bpm. Informed the pt to relax, rest, stay hydrated, hold caffeine, and I will route this message to Dr Delton See to review and advise on. Informed the pt that I will call her back once recommendations received. Pt verbalized understanding and agrees with this plan.

## 2017-09-11 NOTE — Telephone Encounter (Signed)
Patient calling,states that she had a strong "burning" around her heart and she had hot flash. Patient states that she had these same symptoms before and would like to speak with nurse.

## 2017-09-12 ENCOUNTER — Ambulatory Visit (INDEPENDENT_AMBULATORY_CARE_PROVIDER_SITE_OTHER): Payer: PRIVATE HEALTH INSURANCE

## 2017-09-12 ENCOUNTER — Encounter: Payer: Self-pay | Admitting: *Deleted

## 2017-09-12 DIAGNOSIS — I4891 Unspecified atrial fibrillation: Secondary | ICD-10-CM | POA: Diagnosis not present

## 2017-09-12 DIAGNOSIS — I480 Paroxysmal atrial fibrillation: Secondary | ICD-10-CM | POA: Diagnosis not present

## 2017-09-20 ENCOUNTER — Telehealth: Payer: Self-pay | Admitting: Cardiology

## 2017-09-20 ENCOUNTER — Telehealth: Payer: Self-pay | Admitting: Family Medicine

## 2017-09-20 DIAGNOSIS — J449 Chronic obstructive pulmonary disease, unspecified: Secondary | ICD-10-CM

## 2017-09-20 NOTE — Telephone Encounter (Signed)
Result Notes for Holter monitor - 48 hour   Notes recorded by Loa Socks, LPN on 2/42/6834 at 5:30 PM EST Disregard FYI message, pt returned a call back to the office. Endorsed to the pt that per Dr Delton See, her 48 hr holter monitor showed frequent PACs, short runs of SVT, no atrial fibrillation, and she should continue the same management and schedule an appointment to discuss this.  Scheduled the pt an appt with Vin Bhagat PA-C for next Wednesday 09/25/17 at 1130.  Pt aware to arrive 15 mins prior to this appt.  Pt verbalized understanding and agrees with this plan. ------  Notes recorded by Loa Socks, LPN on 1/96/2229 at 4:58 PM EST Left a message for the pt to call back.  3rd attempt at trying to make contact with the pt, and no return call back received. Will forward this result to Dr Delton See as an Lorain Childes

## 2017-09-20 NOTE — Telephone Encounter (Signed)
New message     Patient returning call for holter monitor

## 2017-09-20 NOTE — Telephone Encounter (Signed)
Pt. Called requesting a refill on her albuterol (PROVENTIL HFA;VENTOLIN HFA) 108 (90 Base) MCG/ACT inhaler  Pt. [facility]. Please f/u

## 2017-09-23 ENCOUNTER — Encounter: Payer: Self-pay | Admitting: Family Medicine

## 2017-09-23 MED ORDER — ALBUTEROL SULFATE HFA 108 (90 BASE) MCG/ACT IN AERS: 2.0000 | INHALATION_SPRAY | Freq: Four times a day (QID) | RESPIRATORY_TRACT | 1 refills | Status: AC | PRN

## 2017-09-23 NOTE — Telephone Encounter (Signed)
Refilled

## 2017-09-25 ENCOUNTER — Ambulatory Visit (INDEPENDENT_AMBULATORY_CARE_PROVIDER_SITE_OTHER): Payer: PRIVATE HEALTH INSURANCE | Admitting: Physician Assistant

## 2017-09-25 ENCOUNTER — Encounter: Payer: Self-pay | Admitting: Physician Assistant

## 2017-09-25 VITALS — BP 92/60 | HR 79 | Resp 16 | Ht 63.5 in | Wt 110.4 lb

## 2017-09-25 DIAGNOSIS — I480 Paroxysmal atrial fibrillation: Secondary | ICD-10-CM | POA: Diagnosis not present

## 2017-09-25 DIAGNOSIS — I471 Supraventricular tachycardia: Secondary | ICD-10-CM | POA: Diagnosis not present

## 2017-09-25 DIAGNOSIS — Z720 Tobacco use: Secondary | ICD-10-CM

## 2017-09-25 DIAGNOSIS — I5042 Chronic combined systolic (congestive) and diastolic (congestive) heart failure: Secondary | ICD-10-CM

## 2017-09-25 DIAGNOSIS — Z954 Presence of other heart-valve replacement: Secondary | ICD-10-CM

## 2017-09-25 MED ORDER — LISINOPRIL 2.5 MG PO TABS: ORAL_TABLET | ORAL | 9 refills | Status: DC

## 2017-09-25 MED ORDER — METOPROLOL TARTRATE 25 MG PO TABS: 25.0000 mg | ORAL_TABLET | Freq: Two times a day (BID) | ORAL | 9 refills | Status: DC

## 2017-09-25 NOTE — Progress Notes (Signed)
Cardiology Office Note    Date:  09/25/2017   ID:  Sheila Branch, DOB March 03, 1971, MRN [id number]  PCP:  Sheila Shaggy, MD  Cardiologist:  Dr. Delton See   Chief Complaint: holter monitor follow up  History of Present Illness:   Sheila Branch is a 47 y.o. 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   first began to experience progressive dyspnea in late 2016 and was subsequently admitted in February 2017 with pneumonia and rapid atrial fibrillation. She had normal LV function at that time and require TEE and cardioversion. She was also noted to have severe aortic insufficiency and subvalvular aortic membrane. She was readmitted in late April 2017 with GI illness and diarrhea. In that setting, she had recurrent atrial fibrillation and was placed on amiodarone with subsequent conversion to sinus rhythm. She underwent diagnostic catheterization revealing normal coronary arteries with elevated right heart pressures including a pulmonary artery pressure of 90/44. Cardiac output and index were within normal limits. She was subsequently referred to thoracic surgery and it was felt that she would benefit from mechanical aortic valve replacement with resection of the subaortic membrane. This was ultimately performed on June 1 in addition to ligation of the patent ductus arteriosus and maze procedure.  Last echo 04/2016 showed normal LVEF of 50-55%, grade 2 DD, normal functioning Aortic valve.   Last seen by Dr. Delton See 08/2016.   Recently noted some atypical symptoms. Given no CAD by cardiac cath  previously Dr. Delton See recommended 48 hours holter monitor. This showed Frequent PACs, short runs of SVT, no atrial fibrillation--->continue the same management.   Today patient states that she had an episode of "burning sensation" approximately 2 weeks ago while driving.  She was very anxious at that time.  Her burning sensation is similar to when she had atrial fibrillation many years ago.  No associated chest pain, shortness of breath, dizziness or syncope.  Ongoing intermittent palpable.  Denies lower extremity edema, orthopnea, PND or melena.  Compliant with medications.  Recent TSH and electrolyte check were normal by PCP.  Past Medical History:  Diagnosis Date  . Anxiety   . Aortic insufficiency    a. 10/2015 Echo: mod-sev AI;  b. 02/09/2016 23 mm Sorin Carbomedics Top Hat bileaflet mechanical valve -->anticoagulated w/ coumadin;  c. 02/17/2016 Echo: EF 25-30%, mod AS, mild AI, mild MR.  . CHD (congenital heart disease)    a. PDA s/p ligation 02/09/16.  Marland Kitchen Chronic combined systolic and diastolic congestive heart failure (HCC)    a. 10/2015 Echo: EF 50-55%;  b. 02/17/2016 Echo (post-op): EF 25-30%, anteroseptal AK.  Marland Kitchen COPD (chronic obstructive pulmonary disease) (HCC)    a. Remote tobacco abuse - quit 10/2015.  Marland Kitchen History of pneumonia   . NICM (nonischemic cardiomyopathy) (HCC)    a. 10/2015 Echo: EF 50-55%;  b. 01/2016 Cath: nl cors;  c. 02/17/2016 (post-op): EF 25-30%, anteroseptal AK.  Marland Kitchen PAF (paroxysmal atrial fibrillation) (HCC) 01/08/2016   a. 10/2015 s/p TEE & DCCV;  b. 12/2015 recurrent AF-->Amio-->converted to sinus.  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 01/2016 RHC: PA 90/44.  Marland Kitchen Prolonged QT interval  a. Stable @ ~ 508 msec on amio 200mg  daily.  . 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 02/09/2016 s/p 23 mm Sorin Carbomedics Top Hat bileaflet mechanical valve -->anticoagulated w/ coumadin.  . S/P repair  of patent ductus arteriosus 02/09/2016  . Subvalvar aortic stenosis    a. 02/09/2016 s/p resection of subvalvular Ao membrane.    Past Surgical History:  Procedure Laterality Date  . AORTIC VALVE REPLACEMENT N/A 02/09/2016   Procedure: AORTIC VALVE REPLACEMENT USING A SIZE 23 CARBOMEDICS PROSTHESIS AND RESECTION OF SUBVALVAR AORTIC STENOSIS;  Surgeon: Purcell Nails, MD;  Location: [facility] OR;  Service: Open Heart Surgery;  Laterality: N/A;  . CARDIAC CATHETERIZATION  childhood  . CARDIAC CATHETERIZATION N/A 01/11/2016   Procedure: Right/Left Heart Cath and Coronary Angiography;  Surgeon: Kathleene Hazel, MD;  Location: [facility] INVASIVE CV LAB;  Service: Cardiovascular;  Laterality: N/A;  . CARDIOVERSION N/A 11/09/2015   Procedure: CARDIOVERSION;  Surgeon: Lars Masson, MD;  Location: [facility] ENDOSCOPY;  Service: Cardiovascular;  Laterality: N/A;  . CESAREAN SECTION     pt. denies  . MAZE N/A 02/09/2016   Procedure: MAZE;  Surgeon: Purcell Nails, MD;  Location: [facility] OR;  Service: Open Heart Surgery;  Laterality: N/A;  . MOUTH SURGERY Bilateral 01-25-16  . MULTIPLE EXTRACTIONS WITH ALVEOLOPLASTY N/A 01/25/2016   Procedure: Extraction of tooth #'s 4,5,16-18,30,31 with alveoloplasty and gross debridement of remaining teeth.;  Surgeon: Charlynne Pander, DDS;  Location: [facility] OR;  Service: Oral Surgery;  Laterality: N/A;  . PATENT DUCTUS ARTERIOUS REPAIR N/A 02/09/2016   Procedure: PATENT DUCTUS ARTERIOSUS (PDA) REPAIR;  Surgeon: Purcell Nails, MD;  Location: [facility] OR;  Service: Open Heart Surgery;  Laterality: N/A;  . 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 TEE WITHOUT CARDIOVERSION N/A 11/09/2015   Procedure: TRANSESOPHAGEAL ECHOCARDIOGRAM (TEE);  Surgeon: Lars Masson, MD;  Location: [facility] ENDOSCOPY;  Service: Cardiovascular;  Laterality: N/A;  . TEE WITHOUT CARDIOVERSION N/A 02/09/2016   Procedure: TRANSESOPHAGEAL ECHOCARDIOGRAM (TEE);  Surgeon: Purcell Nails, MD;  Location: [facility]  OR;  Service: Open Heart Surgery;  Laterality: N/A;  . TUBAL LIGATION      Current Medications: Prior to Admission medications   Medication Sig Start Date End Date Taking? Authorizing Provider  albuterol (PROVENTIL HFA;VENTOLIN HFA) 108 (90 Base) MCG/ACT inhaler Inhale 2 puffs into the lungs every 6 (six) hours as needed for wheezing or shortness of breath. 09/23/17   Sheila Shaggy, MD  buPROPion (WELLBUTRIN SR) 150 MG 12 hr tablet Take 1 tablet (150 mg total) by mouth 2 (two) times daily. For smoking cessation Patient not taking: Reported on 08/07/2017 04/10/17   Sheila Shaggy, MD  lisinopril (PRINIVIL,ZESTRIL) 5 MG tablet TAKE ONE TABLET BY MOUTH ONCE DAILY (DISCONTINUE PREVIOUS DOSE) 04/10/17   Sheila Shaggy, MD  metoprolol tartrate (LOPRESSOR) 25 MG tablet Take 0.5 tablets (12.5 mg total) by mouth 2 (two) times daily. 05/06/17   Sheila Shaggy, MD  warfarin (COUMADIN) 1 MG tablet TAKE AS DIRECTED BY  COUMADIN  CLINIC 08/26/17   Lars Masson, MD    Allergies:   Bupropion and Penicillins cross reactors   Social History   Socioeconomic History  . Marital status: Single    Spouse name: None  . Number of children: None  . Years of education: None  . Highest education level: None  Social Needs  . Financial resource strain:  None  . Food insecurity - worry: None  . Food insecurity - inability: None  . Transportation needs - medical: None  . Transportation needs - non-medical: None  Occupational History  . None  Tobacco Use  . Smoking status: Former Smoker    Packs/day: 1.00    Years: 23.00    Pack years: 23.00    Types: Cigarettes    Last attempt to quit: 09/18/2017    Years since quitting: 0.0  . Smokeless tobacco: Never Used  Substance and Sexual Activity  . Alcohol use: None    Comment: last time 3 weeks ago  . Drug use: Yes    Types: Marijuana, Ketamine    Comment: last time two weeks ago  . Sexual activity: None  Other Topics Concern  . None  Social History Narrative    . None     Family History:  The patient's family history includes Heart disease in her mother; Other in her father.   ROS:   Please see the history of present illness.    ROS All other systems reviewed and are negative.   PHYSICAL EXAM:   VS:  BP 92/60   Pulse 79   Resp 16   Ht 5' 3.5" (1.613 m)   Wt 110 lb 6.4 oz (50.1 kg)   LMP 09/05/2017   SpO2 99%   BMI 19.25 kg/m    GEN: Well nourished, well developed, in no acute distress  HEENT: normal  Neck: no JVD, carotid bruits, or masses Cardiac:RRR; + systolic murmurs, rubs, or gallops,no edema  Respiratory:  clear to auscultation bilaterally, normal work of breathing GI: soft, nontender, nondistended, + BS MS: no deformity or atrophy  Skin: warm and dry, no rash Neuro:  Alert and Oriented x 3, Strength and sensation are intact Psych: euthymic mood, full affect  Wt Readings from Last 3 Encounters:  09/25/17 110 lb 6.4 oz (50.1 kg)  08/07/17 112 lb 12.8 oz (51.2 kg)  06/17/17 108 lb (49 kg)      Studies/Labs Reviewed:   EKG:  EKG is not  ordered today.    Recent Labs: 08/07/2017: BUN 13; Creatinine, Ser 0.68; Potassium 3.9; Sodium 140; TSH 2.740   Lipid Panel No results found for: CHOL, TRIG, HDL, CHOLHDL, VLDL, LDLCALC, LDLDIRECT  Additional studies/ records that were reviewed today include:   48 hours holter 09/12/2017  Sinus rhythm to sinus tachycardia.  Infrequent PVCs, frequent PACs (2100 in 48 hours).  4 very short runs of SVT, the longest lasting 12 beats, differential includes atrial tachycardia vs atrial fibrillation)   Sinus rhythm to sinus tachycardia. Infrequent PVCs, frequent PACs (2100 in 48 hours). 4 very short runs of SVT, the longest lasting 12 beats, differential includes atrial tachycardia vs atrial fibrillation.  Echocardiogram: 05/07/2016 Study Conclusions  - Left ventricle: The cavity size was normal. Wall thickness was   normal. Systolic function was normal. The estimated ejection    fraction was in the range of 50% to 55%. Wall motion was normal;   there were no regional wall motion abnormalities. Features are   consistent with a pseudonormal left ventricular filling pattern,   with concomitant abnormal relaxation and increased filling   pressure (grade 2 diastolic dysfunction). - Aortic valve: A mechanical prosthesis was present and functioning   normally. Peak velocity (S): 244 cm/s. - Left atrium: The atrium was mildly dilated. Anterior-posterior   dimension: 43 mm. Volume/bsa, ES, (1-plane Simpson&'s, A2C): 32.6   ml/m^2.  Impressions:  - When  compared to prior, EF is now normal.  Right/Left Heart Cath and Coronary Angiography  01/11/2016  Conclusion   1. No angiographic evidence of CAD 2. Elevated right sided pressures.   Recommendations: Further workup in regards to her aortic valve disease per cardiology consult team.     ASSESSMENT & PLAN:    1. SVT -Short runs of SVT noted on 48-hour Holter monitor.  No atrial fibrillation noted.  No recurrent symptoms of "burning sensation".  Likely related to anxiety/stress.  Recent electrolyte and TSH check were normal. -Increase metoprolol to 25 mg twice daily.  Given soft low blood pressure when she is lisinopril to 2.5 mg daily.  2.  Chronic diastolic heart failure/nonischemic cardiomyopathy - Euvolemic on exam.  last echocardiogram showed normalization of left ventricular function.  No orthopnea or PND.  Continue metoprolol and lisinopril.  3.  History of severe aortic insufficiency s/p mechanical AVR - no CHF symptoms. She has mild DOE--> likely due to tobacco smoking. Consider repeat echo during follow up.   4. Tobacco smoking - Cessation advised  5. PAF - As above   Medication Adjustments/Labs and Tests Ordered: Current medicines are reviewed at length with the patient today.  Concerns regarding medicines are outlined above.  Medication changes, Labs and Tests ordered today are listed in the  Patient Instructions below. Patient Instructions  Medication Instructions:  Your physician has recommended you make the following change in your medication:  1. Decrease lisinopril (2.5 mg) daily 2.  Increase metoprolol (25 mg ) twice daily, sent in today to patient's requested pharmacy.   Labwork: -None  Testing/Procedures: -None  Follow-Up: Your physician recommends that you keep your scheduled  follow-up appointment with Dr. Delton See   Any Other Special Instructions Will Be Listed Below (If Applicable).     If you need a refill on your cardiac medications before your next appointment, please call your pharmacy.      Lorelei Pont, [place]  09/25/2017 11:56 AM    [facility] [address] [place], [place], [place]  [address] Phone: [phone]; Fax: [phone]

## 2017-09-25 NOTE — Patient Instructions (Signed)
Medication Instructions:  Your physician has recommended you make the following change in your medication:  1. Decrease lisinopril (2.5 mg) daily 2.  Increase metoprolol (25 mg ) twice daily, sent in today to patient's requested pharmacy.   Labwork: -None  Testing/Procedures: -None  Follow-Up: Your physician recommends that you keep your scheduled  follow-up appointment with Dr. Delton See   Any Other Special Instructions Will Be Listed Below (If Applicable).     If you need a refill on your cardiac medications before your next appointment, please call your pharmacy.

## 2017-10-07 ENCOUNTER — Ambulatory Visit (INDEPENDENT_AMBULATORY_CARE_PROVIDER_SITE_OTHER): Payer: PRIVATE HEALTH INSURANCE | Admitting: *Deleted

## 2017-10-07 DIAGNOSIS — I359 Nonrheumatic aortic valve disorder, unspecified: Secondary | ICD-10-CM | POA: Diagnosis not present

## 2017-10-07 DIAGNOSIS — I480 Paroxysmal atrial fibrillation: Secondary | ICD-10-CM

## 2017-10-07 DIAGNOSIS — Z954 Presence of other heart-valve replacement: Secondary | ICD-10-CM

## 2017-10-07 DIAGNOSIS — I4891 Unspecified atrial fibrillation: Secondary | ICD-10-CM

## 2017-10-07 DIAGNOSIS — Z9889 Other specified postprocedural states: Secondary | ICD-10-CM | POA: Diagnosis not present

## 2017-10-07 DIAGNOSIS — Z8679 Personal history of other diseases of the circulatory system: Secondary | ICD-10-CM

## 2017-10-07 DIAGNOSIS — Z8774 Personal history of (corrected) congenital malformations of heart and circulatory system: Secondary | ICD-10-CM

## 2017-10-07 DIAGNOSIS — Z5181 Encounter for therapeutic drug level monitoring: Secondary | ICD-10-CM | POA: Diagnosis not present

## 2017-10-07 DIAGNOSIS — I4892 Unspecified atrial flutter: Secondary | ICD-10-CM

## 2017-10-07 LAB — POCT INR: INR: 2.3

## 2017-10-07 NOTE — Patient Instructions (Signed)
Description   Today take 2 tablets then continue on same dose 1.5 tablets (1.5mg )  every day except 1 tablet (1mg ) on Sundays, Tuesdays and Thursdays. Recheck INR in 3 weeks.  Call with any changes [phone].

## 2017-10-28 ENCOUNTER — Other Ambulatory Visit: Payer: Self-pay | Admitting: Cardiology

## 2017-10-28 ENCOUNTER — Ambulatory Visit (INDEPENDENT_AMBULATORY_CARE_PROVIDER_SITE_OTHER): Payer: PRIVATE HEALTH INSURANCE

## 2017-10-28 DIAGNOSIS — Z954 Presence of other heart-valve replacement: Secondary | ICD-10-CM | POA: Diagnosis not present

## 2017-10-28 DIAGNOSIS — Z8679 Personal history of other diseases of the circulatory system: Secondary | ICD-10-CM

## 2017-10-28 DIAGNOSIS — I359 Nonrheumatic aortic valve disorder, unspecified: Secondary | ICD-10-CM | POA: Diagnosis not present

## 2017-10-28 DIAGNOSIS — Z8774 Personal history of (corrected) congenital malformations of heart and circulatory system: Secondary | ICD-10-CM | POA: Diagnosis not present

## 2017-10-28 DIAGNOSIS — Z9889 Other specified postprocedural states: Secondary | ICD-10-CM | POA: Diagnosis not present

## 2017-10-28 DIAGNOSIS — I4891 Unspecified atrial fibrillation: Secondary | ICD-10-CM | POA: Diagnosis not present

## 2017-10-28 DIAGNOSIS — Z5181 Encounter for therapeutic drug level monitoring: Secondary | ICD-10-CM | POA: Diagnosis not present

## 2017-10-28 DIAGNOSIS — I480 Paroxysmal atrial fibrillation: Secondary | ICD-10-CM

## 2017-10-28 DIAGNOSIS — I4892 Unspecified atrial flutter: Secondary | ICD-10-CM | POA: Diagnosis not present

## 2017-10-28 LAB — POCT INR: INR: 2.9

## 2017-10-28 NOTE — Patient Instructions (Signed)
Description   Continue on same dose 1.5 tablets (1.5mg )  every day except 1 tablet (1mg ) on Sundays, Tuesdays and Thursdays. Recheck INR in 4 weeks.  Call with any changes [phone].

## 2017-11-04 ENCOUNTER — Encounter: Payer: Self-pay | Admitting: Family Medicine

## 2017-11-04 ENCOUNTER — Other Ambulatory Visit (HOSPITAL_COMMUNITY)
Admission: RE | Admit: 2017-11-04 | Discharge: 2017-11-04 | Disposition: A | Discharge status: Home / Self Care | Payer: PRIVATE HEALTH INSURANCE | Source: Ambulatory Visit | Attending: Family Medicine | Admitting: Family Medicine

## 2017-11-04 ENCOUNTER — Ambulatory Visit: Payer: Self-pay | Admitting: Family Medicine

## 2017-11-04 ENCOUNTER — Ambulatory Visit: Payer: PRIVATE HEALTH INSURANCE | Attending: Family Medicine | Admitting: Family Medicine

## 2017-11-04 VITALS — BP 104/73 | HR 73 | Temp 98.3°F | Resp 16 | Ht 63.0 in | Wt 111.4 lb

## 2017-11-04 DIAGNOSIS — Z880 Allergy status to penicillin: Secondary | ICD-10-CM | POA: Insufficient documentation

## 2017-11-04 DIAGNOSIS — Z1231 Encounter for screening mammogram for malignant neoplasm of breast: Secondary | ICD-10-CM | POA: Diagnosis not present

## 2017-11-04 DIAGNOSIS — Z79899 Other long term (current) drug therapy: Secondary | ICD-10-CM | POA: Insufficient documentation

## 2017-11-04 DIAGNOSIS — Z7901 Long term (current) use of anticoagulants: Secondary | ICD-10-CM | POA: Insufficient documentation

## 2017-11-04 DIAGNOSIS — Z124 Encounter for screening for malignant neoplasm of cervix: Secondary | ICD-10-CM | POA: Diagnosis not present

## 2017-11-04 DIAGNOSIS — Z1239 Encounter for other screening for malignant neoplasm of breast: Secondary | ICD-10-CM

## 2017-11-04 DIAGNOSIS — Z9889 Other specified postprocedural states: Secondary | ICD-10-CM | POA: Diagnosis not present

## 2017-11-04 DIAGNOSIS — Z9851 Tubal ligation status: Secondary | ICD-10-CM | POA: Insufficient documentation

## 2017-11-04 DIAGNOSIS — Z0000 Encounter for general adult medical examination without abnormal findings: Secondary | ICD-10-CM | POA: Diagnosis not present

## 2017-11-04 DIAGNOSIS — J449 Chronic obstructive pulmonary disease, unspecified: Secondary | ICD-10-CM | POA: Insufficient documentation

## 2017-11-04 DIAGNOSIS — Z23 Encounter for immunization: Secondary | ICD-10-CM | POA: Diagnosis not present

## 2017-11-04 MED ORDER — TETANUS-DIPHTH-ACELL PERTUSSIS 5-2.5-18.5 LF-MCG/0.5 IM SUSP: 0.5000 mL | Freq: Once | INTRAMUSCULAR | 0 refills | Status: AC

## 2017-11-04 MED FILL — BOOSTRIX VACCINE SYRINGE: 5-2.5-18.5 | 1 days supply | Qty: 1 | Fill #0

## 2017-11-04 NOTE — Progress Notes (Signed)
Patient is here for a physical.  Pt would like to talk about her Xray results.

## 2017-11-04 NOTE — Patient Instructions (Signed)

## 2017-11-04 NOTE — Progress Notes (Addendum)
Subjective:  Patient ID: Sheila Branch, female    DOB: 03-Sep-1971  Age: 47 y.o. MRN: [id number]  CC: Annual Exam   HPI ZYLPHA POYNOR  is  a 47 year old female with a history of tobacco abuse, congenital heart disease,chronic combined systolic and diastolic heart failure (EF 50-55%), A. fib, COPD, aortic insufficiency, aortic stenosis s/p Mechanical aortic valve replacement with resection of subvalvular aortic membrane, ligation of patent ductus arteriosus and maze procedure here for a complete physical exam.  She underwent a C-spine and pelvic x-ray in 07/2018 due to complains of neck pain and pelvic pain. 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  Past Medical History:  Diagnosis Date  . Anxiety   . Aortic insufficiency    a. 10/2015 Echo: mod-sev AI;  b. 02/09/2016 23 mm Sorin Carbomedics Top Hat bileaflet mechanical valve -->anticoagulated w/ coumadin;  c. 02/17/2016 Echo: EF 25-30%, mod AS, mild AI, mild MR.  . CHD (congenital heart disease)    a. PDA s/p ligation 02/09/16.  Marland Kitchen Chronic combined systolic and diastolic congestive heart failure ([place])    a. 10/2015 Echo: EF 50-55%;  b. 02/17/2016 Echo (post-op): EF 25-30%, anteroseptal AK.  Marland Kitchen COPD (chronic obstructive pulmonary disease) ([place])    a. Remote tobacco abuse - quit 10/2015.  Marland Kitchen History of pneumonia   . NICM (nonischemic cardiomyopathy) ([place])    a. 10/2015 Echo: EF 50-55%;  b. 01/2016 Cath: nl cors;  c. 02/17/2016 (post-op): EF 25-30%, anteroseptal AK.  Marland Kitchen PAF (paroxysmal atrial fibrillation) ([place]) 01/08/2016   a. 10/2015 s/p TEE & DCCV;  b. 12/2015 recurrent AF-->Amio-->converted to sinus.  Marland Kitchen PAH (pulmonary arterial hypertension) with portal hypertension ([place])    a. 01/2016 RHC: PA 90/44.  Marland Kitchen Prolonged QT interval    a. Stable @ ~ 508 msec on amio 263m daily.  . 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 02/09/2016 s/p 23 mm  Sorin Carbomedics Top Hat bileaflet mechanical valve -->anticoagulated w/ coumadin.  . S/P repair of patent ductus arteriosus 02/09/2016  . Subvalvar aortic stenosis    a. 02/09/2016 s/p resection of subvalvular Ao membrane.    Past Surgical History:  Procedure Laterality Date  . AORTIC VALVE REPLACEMENT N/A 02/09/2016   Procedure: AORTIC VALVE REPLACEMENT USING A SIZE 23 CARBOMEDICS PROSTHESIS AND RESECTION OF SUBVALVAR AORTIC STENOSIS;  Surgeon: CRexene Alberts MD;  [place] [facility]  Service: Open Heart Surgery;  Laterality: N/A;  . CARDIAC CATHETERIZATION  childhood  . CARDIAC CATHETERIZATION N/A 01/11/2016   Procedure: Right/Left Heart Cath and Coronary Angiography;  Surgeon: CBurnell Blanks MD;  [facility];  Service: Cardiovascular;  Laterality: N/A;  . CARDIOVERSION N/A 11/09/2015   Procedure: CARDIOVERSION;  Surgeon: KDorothy Spark MD;  [place] [facility]  Service: Cardiovascular;  Laterality: N/A;  . CESAREAN SECTION     pt. denies  . MAZE N/A 02/09/2016   Procedure: MAZE;  Surgeon: CRexene Alberts MD;  [place] [facility]  Service: Open Heart Surgery;  Laterality: N/A;  . MOUTH SURGERY Bilateral 01-25-16  . MULTIPLE EXTRACTIONS WITH ALVEOLOPLASTY N/A 01/25/2016   Procedure: Extraction of tooth #'s 42,3,76-28,31,51with alveoloplasty and gross debridement of remaining teeth.;  Surgeon: RLenn Cal DDS;  [place] [facility]  Service: Oral Surgery;  Laterality: N/A;  . PATENT DUCTUS ARTERIOUS REPAIR N/A 02/09/2016   Procedure: PATENT DUCTUS ARTERIOSUS (PDA) REPAIR;  Surgeon: CBraulio Conte  Keturah Barre, MD;  Location: [place];  Service: Open Heart Surgery;  Laterality: N/A;  . 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 TEE WITHOUT CARDIOVERSION N/A 11/09/2015   Procedure: TRANSESOPHAGEAL ECHOCARDIOGRAM (TEE);  Surgeon: Dorothy Spark, MD;  Location: [place];  Service: Cardiovascular;  Laterality: N/A;  . TEE WITHOUT CARDIOVERSION N/A 02/09/2016     Procedure: TRANSESOPHAGEAL ECHOCARDIOGRAM (TEE);  Surgeon: Rexene Alberts, MD;  Location: [place];  Service: Open Heart Surgery;  Laterality: N/A;  . TUBAL LIGATION      Allergies  Allergen Reactions  . Bupropion Other (See Comments)    Suicidal thoughts   . Penicillins Cross Reactors Swelling and Other (See Comments)    Mouth Ulcers Has patient had a PCN reaction causing immediate rash, facial/tongue/throat swelling, SOB or lightheadedness with hypotension: Yes Has patient had a PCN reaction causing severe rash involving mucus membranes or skin necrosis: Yes Has patient had a PCN reaction that required hospitalization No Has patient had a PCN reaction occurring within the last 10 years: No If all of the above answers are "NO", then may proceed with Cephalosporin use.       Outpatient Medications Prior to Visit  Medication Sig Dispense Refill  . albuterol (PROVENTIL HFA;VENTOLIN HFA) 108 (90 Base) MCG/ACT inhaler Inhale 2 puffs into the lungs every 6 (six) hours as needed for wheezing or shortness of breath. 1 Inhaler 1  . escitalopram (LEXAPRO) 10 MG tablet Take 10 mg by mouth daily.    Marland Kitchen lisinopril (PRINIVIL,ZESTRIL) 2.5 MG tablet TAKE ONE TABLET BY MOUTH ONCE DAILY (DISCONTINUE PREVIOUS DOSE) 30 tablet 9  . metoprolol tartrate (LOPRESSOR) 25 MG tablet Take 1 tablet (25 mg total) by mouth 2 (two) times daily. 60 tablet 9  . warfarin (COUMADIN) 1 MG tablet TAKE AS DIRECTED BY  COUMADIN  CLINIC 120 tablet 0   No facility-administered medications prior to visit.     ROS Review of Systems  Constitutional: Negative for activity change, appetite change and fatigue.  HENT: Negative for congestion, sinus pressure and sore throat.   Eyes: Negative for visual disturbance.  Respiratory: Negative for cough, chest tightness, shortness of breath and wheezing.   Cardiovascular: Negative for chest pain and palpitations.  Gastrointestinal: Negative for abdominal distention, abdominal pain  and constipation.  Endocrine: Negative for polydipsia.  Genitourinary: Negative for dysuria and frequency.  Musculoskeletal: Negative for arthralgias and back pain.  Skin: Negative for rash.  Neurological: Negative for tremors, light-headedness and numbness.  Hematological: Does not bruise/bleed easily.  Psychiatric/Behavioral: Negative for agitation and behavioral problems.    Objective:  BP 104/73 (BP Location: Left Arm, Patient Position: Sitting, Cuff Size: Normal)   Pulse 73   Temp 98.3 F (36.8 C) (Oral)   Resp 16   Ht _0  (1.6 m)   Wt 111 lb 6.4 oz (50.5 kg)   LMP 10/25/2017   SpO2 99%   BMI 19.73 kg/m   BP/Weight 11/04/2017 09/25/2017 46/65/9935  Systolic BP 701 92 779  Diastolic BP 73 60 70  Wt. (Lbs) 111.4 110.4 112.8  BMI 19.73 19.25 19.98      Physical Exam  Constitutional: She is oriented to person, place, and time. She appears well-developed and well-nourished. No distress.  HENT:  Head: Normocephalic.  Right Ear: External ear normal.  Left Ear: External ear normal.  Nose: Nose normal.  Mouth/Throat: Oropharynx is clear and moist.  Eyes: Conjunctivae and EOM are normal. Pupils  are equal, round, and reactive to light.  Neck: Normal range of motion. No JVD present.  Cardiovascular: Normal rate, regular rhythm and intact distal pulses. Exam reveals no gallop.  Murmur (2/6 systolic murmur) heard. Pulmonary/Chest: Effort normal and breath sounds normal. No respiratory distress. She has no wheezes. She has no rales. She exhibits no tenderness.  Healed vertical surgical scar  Abdominal: Soft. Bowel sounds are normal. She exhibits no distension and no mass. There is no tenderness.  Musculoskeletal: Normal range of motion. She exhibits no edema or tenderness.  Neurological: She is alert and oriented to person, place, and time. She has normal reflexes.  Skin: Skin is warm and dry. She is not diaphoretic.  Psychiatric: She has a normal mood and affect.      Assessment & Plan:   1. Annual physical exam Counseled on 150 minutes of exercise per week, healthy eating (including decreased daily intake of saturated fats, cholesterol, added sugars, sodium), STI prevention, routine healthcare maintenance. - CMP14+EGFR - Lipid panel  2. Screening for breast cancer  MM Digital Screening; Future  3. Need for Tdap vaccination Tdap  administered today  4. Need for influenza vaccination - Flu Vaccine QUAD 36+ mos IM  5. Screening for cervical cancer - Cytology - PAP([place])    Addendum 11/05/17: The patient sent a message through 'my chart' indicating after she received a flu shot today in the clinic she was informed by her nurse at work she had earlier received a flu shot in 06/2017 and would like this documented in her chart.  Meds ordered this encounter  Medications  . Tdap (BOOSTRIX) 5-2.5-18.5 LF-MCG/0.5 injection    Sig: Inject 0.5 mLs into the muscle once for 1 dose.    Dispense:  0.5 mL    Refill:  0    Follow-up: Return in about 6 months (around 05/04/2018) for follow up of chronic medical conditions.   Charlott Rakes MD

## 2017-11-05 LAB — CYTOLOGY - PAP
DIAGNOSIS: NEGATIVE
HPV: NOT DETECTED

## 2017-11-05 LAB — CMP14+EGFR
ALK PHOS: 81 IU/L (ref 39–117)
ALT: 24 IU/L (ref 0–32)
AST: 24 IU/L (ref 0–40)
Albumin/Globulin Ratio: 1.5 (ref 1.2–2.2)
Albumin: 4.1 g/dL (ref 3.5–5.5)
BUN/Creatinine Ratio: 11 (ref 9–23)
BUN: 9 mg/dL (ref 6–24)
Bilirubin Total: 0.6 mg/dL (ref 0.0–1.2)
CALCIUM: 9.1 mg/dL (ref 8.7–10.2)
CO2: 23 mmol/L (ref 20–29)
CREATININE: 0.79 mg/dL (ref 0.57–1.00)
Chloride: 99 mmol/L (ref 96–106)
GFR calc Af Amer: 105 mL/min/{1.73_m2} (ref >59)
GFR, EST NON AFRICAN AMERICAN: 91 mL/min/{1.73_m2} (ref >59)
GLOBULIN, TOTAL: 2.7 g/dL (ref 1.5–4.5)
Glucose: 87 mg/dL (ref 65–99)
Potassium: 4.1 mmol/L (ref 3.5–5.2)
SODIUM: 136 mmol/L (ref 134–144)
Total Protein: 6.8 g/dL (ref 6.0–8.5)

## 2017-11-05 LAB — LIPID PANEL
CHOL/HDL RATIO: 3 ratio (ref 0.0–4.4)
Cholesterol, Total: 145 mg/dL (ref 100–199)
HDL: 49 mg/dL (ref >39)
LDL CALC: 80 mg/dL (ref 0–99)
Triglycerides: 80 mg/dL (ref 0–149)
VLDL Cholesterol Cal: 16 mg/dL (ref 5–40)

## 2017-11-25 ENCOUNTER — Ambulatory Visit (INDEPENDENT_AMBULATORY_CARE_PROVIDER_SITE_OTHER): Payer: PRIVATE HEALTH INSURANCE | Admitting: *Deleted

## 2017-11-25 DIAGNOSIS — I4892 Unspecified atrial flutter: Secondary | ICD-10-CM

## 2017-11-25 DIAGNOSIS — Z9889 Other specified postprocedural states: Secondary | ICD-10-CM

## 2017-11-25 DIAGNOSIS — I4891 Unspecified atrial fibrillation: Secondary | ICD-10-CM | POA: Diagnosis not present

## 2017-11-25 DIAGNOSIS — Z8679 Personal history of other diseases of the circulatory system: Secondary | ICD-10-CM

## 2017-11-25 DIAGNOSIS — Z8774 Personal history of (corrected) congenital malformations of heart and circulatory system: Secondary | ICD-10-CM

## 2017-11-25 DIAGNOSIS — I359 Nonrheumatic aortic valve disorder, unspecified: Secondary | ICD-10-CM

## 2017-11-25 DIAGNOSIS — Z5181 Encounter for therapeutic drug level monitoring: Secondary | ICD-10-CM

## 2017-11-25 DIAGNOSIS — I480 Paroxysmal atrial fibrillation: Secondary | ICD-10-CM

## 2017-11-25 DIAGNOSIS — Z954 Presence of other heart-valve replacement: Secondary | ICD-10-CM

## 2017-11-25 LAB — POCT INR: INR: 4.3

## 2017-11-25 NOTE — Patient Instructions (Signed)
Description   Do not take coumadin today March 18th then continue on same dose 1.5 tablets (1.5mg )  every day except 1 tablet (1mg ) on Sundays, Tuesdays and Thursdays. Recheck INR in 2 weeks.  Call with any changes [phone]. Do serving of greens today and keep intake of greens consistent

## 2017-12-10 ENCOUNTER — Ambulatory Visit (INDEPENDENT_AMBULATORY_CARE_PROVIDER_SITE_OTHER): Payer: PRIVATE HEALTH INSURANCE | Admitting: *Deleted

## 2017-12-10 DIAGNOSIS — I480 Paroxysmal atrial fibrillation: Secondary | ICD-10-CM

## 2017-12-10 DIAGNOSIS — Z8679 Personal history of other diseases of the circulatory system: Secondary | ICD-10-CM

## 2017-12-10 DIAGNOSIS — Z5181 Encounter for therapeutic drug level monitoring: Secondary | ICD-10-CM | POA: Diagnosis not present

## 2017-12-10 DIAGNOSIS — I4892 Unspecified atrial flutter: Secondary | ICD-10-CM | POA: Diagnosis not present

## 2017-12-10 DIAGNOSIS — I359 Nonrheumatic aortic valve disorder, unspecified: Secondary | ICD-10-CM

## 2017-12-10 DIAGNOSIS — I4891 Unspecified atrial fibrillation: Secondary | ICD-10-CM

## 2017-12-10 DIAGNOSIS — Z954 Presence of other heart-valve replacement: Secondary | ICD-10-CM

## 2017-12-10 DIAGNOSIS — Z8774 Personal history of (corrected) congenital malformations of heart and circulatory system: Secondary | ICD-10-CM | POA: Diagnosis not present

## 2017-12-10 DIAGNOSIS — Z9889 Other specified postprocedural states: Secondary | ICD-10-CM

## 2017-12-10 LAB — POCT INR: INR: 2.1

## 2017-12-10 NOTE — Patient Instructions (Signed)
Description   Today take 2 tablets, then continue on same dose 1.5 tablets (1.5mg )  every day except 1 tablet (1mg ) on Sundays, Tuesdays and Thursdays. Recheck INR in 2 weeks.  Call with any changes [phone]. Do 1 serving of greens each week.

## 2017-12-16 ENCOUNTER — Ambulatory Visit: Payer: Self-pay | Admitting: Cardiology

## 2017-12-17 ENCOUNTER — Ambulatory Visit: Payer: Self-pay | Admitting: Cardiology

## 2017-12-24 ENCOUNTER — Ambulatory Visit (INDEPENDENT_AMBULATORY_CARE_PROVIDER_SITE_OTHER): Payer: PRIVATE HEALTH INSURANCE | Admitting: *Deleted

## 2017-12-24 DIAGNOSIS — I4892 Unspecified atrial flutter: Secondary | ICD-10-CM | POA: Diagnosis not present

## 2017-12-24 DIAGNOSIS — Z954 Presence of other heart-valve replacement: Secondary | ICD-10-CM | POA: Diagnosis not present

## 2017-12-24 DIAGNOSIS — Z8774 Personal history of (corrected) congenital malformations of heart and circulatory system: Secondary | ICD-10-CM

## 2017-12-24 DIAGNOSIS — I359 Nonrheumatic aortic valve disorder, unspecified: Secondary | ICD-10-CM

## 2017-12-24 DIAGNOSIS — Z5181 Encounter for therapeutic drug level monitoring: Secondary | ICD-10-CM

## 2017-12-24 DIAGNOSIS — Z8679 Personal history of other diseases of the circulatory system: Secondary | ICD-10-CM

## 2017-12-24 DIAGNOSIS — I4891 Unspecified atrial fibrillation: Secondary | ICD-10-CM

## 2017-12-24 DIAGNOSIS — Z9889 Other specified postprocedural states: Secondary | ICD-10-CM

## 2017-12-24 DIAGNOSIS — I480 Paroxysmal atrial fibrillation: Secondary | ICD-10-CM

## 2017-12-24 LAB — POCT INR: INR: 3.4

## 2017-12-24 NOTE — Patient Instructions (Signed)
Description   Continue on same dose 1.5 tablets (1.5mg )  every day except 1 tablet (1mg ) on Sundays, Tuesdays and Thursdays. Recheck INR in 3 weeks.  Call with any changes [phone]. Do 1 serving of greens each week.

## 2018-01-14 ENCOUNTER — Ambulatory Visit (INDEPENDENT_AMBULATORY_CARE_PROVIDER_SITE_OTHER): Payer: PRIVATE HEALTH INSURANCE | Admitting: *Deleted

## 2018-01-14 DIAGNOSIS — Z8774 Personal history of (corrected) congenital malformations of heart and circulatory system: Secondary | ICD-10-CM | POA: Diagnosis not present

## 2018-01-14 DIAGNOSIS — Z954 Presence of other heart-valve replacement: Secondary | ICD-10-CM

## 2018-01-14 DIAGNOSIS — Z8679 Personal history of other diseases of the circulatory system: Secondary | ICD-10-CM | POA: Diagnosis not present

## 2018-01-14 DIAGNOSIS — I4891 Unspecified atrial fibrillation: Secondary | ICD-10-CM | POA: Diagnosis not present

## 2018-01-14 DIAGNOSIS — I480 Paroxysmal atrial fibrillation: Secondary | ICD-10-CM | POA: Diagnosis not present

## 2018-01-14 DIAGNOSIS — Z9889 Other specified postprocedural states: Secondary | ICD-10-CM

## 2018-01-14 DIAGNOSIS — Z5181 Encounter for therapeutic drug level monitoring: Secondary | ICD-10-CM | POA: Diagnosis not present

## 2018-01-14 DIAGNOSIS — I4892 Unspecified atrial flutter: Secondary | ICD-10-CM | POA: Diagnosis not present

## 2018-01-14 DIAGNOSIS — I359 Nonrheumatic aortic valve disorder, unspecified: Secondary | ICD-10-CM

## 2018-01-14 LAB — POCT INR: INR: 3.6

## 2018-01-14 NOTE — Patient Instructions (Signed)
Description   Do not take coumadin today May 7th then continue on same dose 1.5 tablets (1.5mg )  every day except 1 tablet (1mg ) on Sundays, Tuesdays and Thursdays. Recheck INR in 2 weeks.  Call with any changes [phone]. Do 1 serving of greens each week.

## 2018-01-30 ENCOUNTER — Ambulatory Visit (INDEPENDENT_AMBULATORY_CARE_PROVIDER_SITE_OTHER): Payer: PRIVATE HEALTH INSURANCE | Admitting: *Deleted

## 2018-01-30 DIAGNOSIS — Z5181 Encounter for therapeutic drug level monitoring: Secondary | ICD-10-CM

## 2018-01-30 DIAGNOSIS — Z954 Presence of other heart-valve replacement: Secondary | ICD-10-CM | POA: Diagnosis not present

## 2018-01-30 DIAGNOSIS — Z9889 Other specified postprocedural states: Secondary | ICD-10-CM

## 2018-01-30 DIAGNOSIS — I481 Persistent atrial fibrillation: Secondary | ICD-10-CM

## 2018-01-30 DIAGNOSIS — I4892 Unspecified atrial flutter: Secondary | ICD-10-CM

## 2018-01-30 DIAGNOSIS — I4891 Unspecified atrial fibrillation: Secondary | ICD-10-CM

## 2018-01-30 DIAGNOSIS — I359 Nonrheumatic aortic valve disorder, unspecified: Secondary | ICD-10-CM

## 2018-01-30 DIAGNOSIS — I480 Paroxysmal atrial fibrillation: Secondary | ICD-10-CM

## 2018-01-30 DIAGNOSIS — Z8679 Personal history of other diseases of the circulatory system: Secondary | ICD-10-CM | POA: Diagnosis not present

## 2018-01-30 DIAGNOSIS — I4819 Other persistent atrial fibrillation: Secondary | ICD-10-CM

## 2018-01-30 DIAGNOSIS — Z8774 Personal history of (corrected) congenital malformations of heart and circulatory system: Secondary | ICD-10-CM | POA: Diagnosis not present

## 2018-01-30 LAB — POCT INR: INR: 2.4 (ref 2.0–3.0)

## 2018-01-30 NOTE — Patient Instructions (Signed)
Description   Today May 23rd take 1 and 1/2 tablets (1.5mg ) then  continue on same dose 1 and 1/2  tablets (1.5mg )  every day except 1 tablet (1mg ) on Sundays, Tuesdays and Thursdays. Recheck INR in 3 weeks.  Call with any changes [phone]. Do 1 serving of greens each week.

## 2018-02-08 ENCOUNTER — Other Ambulatory Visit: Payer: Self-pay | Admitting: Cardiology

## 2018-02-20 ENCOUNTER — Ambulatory Visit (INDEPENDENT_AMBULATORY_CARE_PROVIDER_SITE_OTHER): Payer: PRIVATE HEALTH INSURANCE | Admitting: *Deleted

## 2018-02-20 DIAGNOSIS — Z954 Presence of other heart-valve replacement: Secondary | ICD-10-CM

## 2018-02-20 DIAGNOSIS — I4892 Unspecified atrial flutter: Secondary | ICD-10-CM

## 2018-02-20 DIAGNOSIS — I480 Paroxysmal atrial fibrillation: Secondary | ICD-10-CM

## 2018-02-20 DIAGNOSIS — Z8679 Personal history of other diseases of the circulatory system: Secondary | ICD-10-CM | POA: Diagnosis not present

## 2018-02-20 DIAGNOSIS — Z5181 Encounter for therapeutic drug level monitoring: Secondary | ICD-10-CM | POA: Diagnosis not present

## 2018-02-20 DIAGNOSIS — Z8774 Personal history of (corrected) congenital malformations of heart and circulatory system: Secondary | ICD-10-CM

## 2018-02-20 DIAGNOSIS — I359 Nonrheumatic aortic valve disorder, unspecified: Secondary | ICD-10-CM | POA: Diagnosis not present

## 2018-02-20 DIAGNOSIS — I4891 Unspecified atrial fibrillation: Secondary | ICD-10-CM | POA: Diagnosis not present

## 2018-02-20 DIAGNOSIS — Z9889 Other specified postprocedural states: Secondary | ICD-10-CM

## 2018-02-20 LAB — POCT INR: INR: 2.9 (ref 2.0–3.0)

## 2018-02-20 NOTE — Patient Instructions (Signed)
Description   Continue on same dose 1 and 1/2  tablets (1.5mg )  every day except 1 tablet (1mg ) on Sundays, Tuesdays and Thursdays. Recheck INR in 4 weeks.  Call with any changes [phone]. Do 1 serving of greens each week.

## 2018-03-16 ENCOUNTER — Other Ambulatory Visit: Payer: Self-pay | Admitting: Family Medicine

## 2018-03-16 DIAGNOSIS — I480 Paroxysmal atrial fibrillation: Secondary | ICD-10-CM

## 2018-03-20 ENCOUNTER — Ambulatory Visit (INDEPENDENT_AMBULATORY_CARE_PROVIDER_SITE_OTHER): Payer: PRIVATE HEALTH INSURANCE | Admitting: Pharmacist

## 2018-03-20 DIAGNOSIS — I4892 Unspecified atrial flutter: Secondary | ICD-10-CM | POA: Diagnosis not present

## 2018-03-20 DIAGNOSIS — I480 Paroxysmal atrial fibrillation: Secondary | ICD-10-CM | POA: Diagnosis not present

## 2018-03-20 DIAGNOSIS — Z8679 Personal history of other diseases of the circulatory system: Secondary | ICD-10-CM | POA: Diagnosis not present

## 2018-03-20 DIAGNOSIS — I4891 Unspecified atrial fibrillation: Secondary | ICD-10-CM

## 2018-03-20 DIAGNOSIS — Z9889 Other specified postprocedural states: Secondary | ICD-10-CM | POA: Diagnosis not present

## 2018-03-20 DIAGNOSIS — Z5181 Encounter for therapeutic drug level monitoring: Secondary | ICD-10-CM

## 2018-03-20 DIAGNOSIS — Z954 Presence of other heart-valve replacement: Secondary | ICD-10-CM

## 2018-03-20 DIAGNOSIS — Z8774 Personal history of (corrected) congenital malformations of heart and circulatory system: Secondary | ICD-10-CM | POA: Diagnosis not present

## 2018-03-20 DIAGNOSIS — I359 Nonrheumatic aortic valve disorder, unspecified: Secondary | ICD-10-CM

## 2018-03-20 LAB — POCT INR: INR: 2.6 (ref 2.0–3.0)

## 2018-03-20 NOTE — Patient Instructions (Signed)
Description   Continue on same dose 1 and 1/2  tablets (1.5mg )  every day except 1 tablet (1mg ) on Sundays, Tuesdays and Thursdays. Recheck INR in 5 weeks.  Call with any changes [phone]. Do 1 serving of greens each week.

## 2018-04-24 ENCOUNTER — Ambulatory Visit (INDEPENDENT_AMBULATORY_CARE_PROVIDER_SITE_OTHER): Payer: PRIVATE HEALTH INSURANCE | Admitting: Pharmacist

## 2018-04-24 DIAGNOSIS — I4891 Unspecified atrial fibrillation: Secondary | ICD-10-CM

## 2018-04-24 DIAGNOSIS — Z954 Presence of other heart-valve replacement: Secondary | ICD-10-CM | POA: Diagnosis not present

## 2018-04-24 DIAGNOSIS — Z9889 Other specified postprocedural states: Secondary | ICD-10-CM | POA: Diagnosis not present

## 2018-04-24 DIAGNOSIS — Z5181 Encounter for therapeutic drug level monitoring: Secondary | ICD-10-CM

## 2018-04-24 DIAGNOSIS — I4892 Unspecified atrial flutter: Secondary | ICD-10-CM | POA: Diagnosis not present

## 2018-04-24 DIAGNOSIS — Z8774 Personal history of (corrected) congenital malformations of heart and circulatory system: Secondary | ICD-10-CM | POA: Diagnosis not present

## 2018-04-24 DIAGNOSIS — I359 Nonrheumatic aortic valve disorder, unspecified: Secondary | ICD-10-CM | POA: Diagnosis not present

## 2018-04-24 DIAGNOSIS — Z8679 Personal history of other diseases of the circulatory system: Secondary | ICD-10-CM

## 2018-04-24 DIAGNOSIS — I480 Paroxysmal atrial fibrillation: Secondary | ICD-10-CM

## 2018-04-24 LAB — POCT INR: INR: 2.7 (ref 2.0–3.0)

## 2018-04-24 NOTE — Patient Instructions (Signed)
Description   Continue on same dose 1 and 1/2  tablets (1.5mg )  every day except 1 tablet (1mg ) on Sundays, Tuesdays and Thursdays. Recheck INR in 6 weeks.  Call with any changes [phone]. Do 1 serving of greens each week.

## 2018-05-18 ENCOUNTER — Other Ambulatory Visit: Payer: Self-pay | Admitting: Cardiology

## 2018-06-05 ENCOUNTER — Ambulatory Visit (INDEPENDENT_AMBULATORY_CARE_PROVIDER_SITE_OTHER): Payer: PRIVATE HEALTH INSURANCE

## 2018-06-05 DIAGNOSIS — I359 Nonrheumatic aortic valve disorder, unspecified: Secondary | ICD-10-CM | POA: Diagnosis not present

## 2018-06-05 DIAGNOSIS — I480 Paroxysmal atrial fibrillation: Secondary | ICD-10-CM

## 2018-06-05 DIAGNOSIS — Z8774 Personal history of (corrected) congenital malformations of heart and circulatory system: Secondary | ICD-10-CM

## 2018-06-05 DIAGNOSIS — Z5181 Encounter for therapeutic drug level monitoring: Secondary | ICD-10-CM | POA: Diagnosis not present

## 2018-06-05 DIAGNOSIS — I4891 Unspecified atrial fibrillation: Secondary | ICD-10-CM

## 2018-06-05 DIAGNOSIS — Z8679 Personal history of other diseases of the circulatory system: Secondary | ICD-10-CM

## 2018-06-05 DIAGNOSIS — Z9889 Other specified postprocedural states: Secondary | ICD-10-CM | POA: Diagnosis not present

## 2018-06-05 DIAGNOSIS — Z954 Presence of other heart-valve replacement: Secondary | ICD-10-CM | POA: Diagnosis not present

## 2018-06-05 DIAGNOSIS — I4892 Unspecified atrial flutter: Secondary | ICD-10-CM

## 2018-06-05 LAB — POCT INR: INR: 4.4 — AB (ref 2.0–3.0)

## 2018-06-05 NOTE — Patient Instructions (Signed)
Description   Skip today's dosage of Coumadin, then resume same dosage 1.5 tablets (1.5mg )  every day except 1 tablet (1mg ) on Sundays, Tuesdays and Thursdays. Recheck INR in 3 weeks.  Call with any changes [phone]. Do 1 serving of greens each week.

## 2018-06-26 ENCOUNTER — Ambulatory Visit (INDEPENDENT_AMBULATORY_CARE_PROVIDER_SITE_OTHER): Payer: PRIVATE HEALTH INSURANCE | Admitting: *Deleted

## 2018-06-26 DIAGNOSIS — Z954 Presence of other heart-valve replacement: Secondary | ICD-10-CM | POA: Diagnosis not present

## 2018-06-26 DIAGNOSIS — I359 Nonrheumatic aortic valve disorder, unspecified: Secondary | ICD-10-CM

## 2018-06-26 DIAGNOSIS — I4891 Unspecified atrial fibrillation: Secondary | ICD-10-CM

## 2018-06-26 DIAGNOSIS — Z5181 Encounter for therapeutic drug level monitoring: Secondary | ICD-10-CM | POA: Diagnosis not present

## 2018-06-26 DIAGNOSIS — I4892 Unspecified atrial flutter: Secondary | ICD-10-CM

## 2018-06-26 DIAGNOSIS — Z8774 Personal history of (corrected) congenital malformations of heart and circulatory system: Secondary | ICD-10-CM

## 2018-06-26 DIAGNOSIS — Z9889 Other specified postprocedural states: Secondary | ICD-10-CM | POA: Diagnosis not present

## 2018-06-26 DIAGNOSIS — Z8679 Personal history of other diseases of the circulatory system: Secondary | ICD-10-CM

## 2018-06-26 DIAGNOSIS — I480 Paroxysmal atrial fibrillation: Secondary | ICD-10-CM

## 2018-06-26 LAB — POCT INR: INR: 3.6 — AB (ref 2.0–3.0)

## 2018-06-26 NOTE — Patient Instructions (Signed)
Description   Skip today's dosage of Coumadin, then resume same dosage 1.5 tablets (1.5mg )  every day except 1 tablet (1mg ) on Sundays, Tuesdays and Thursdays. Recheck INR in 2 weeks.  Call with any changes [phone]. Do 1 serving of greens each week.

## 2018-07-24 ENCOUNTER — Ambulatory Visit (INDEPENDENT_AMBULATORY_CARE_PROVIDER_SITE_OTHER): Payer: PRIVATE HEALTH INSURANCE

## 2018-07-24 DIAGNOSIS — I4892 Unspecified atrial flutter: Secondary | ICD-10-CM

## 2018-07-24 DIAGNOSIS — I4891 Unspecified atrial fibrillation: Secondary | ICD-10-CM | POA: Diagnosis not present

## 2018-07-24 DIAGNOSIS — Z5181 Encounter for therapeutic drug level monitoring: Secondary | ICD-10-CM

## 2018-07-24 DIAGNOSIS — Z9889 Other specified postprocedural states: Secondary | ICD-10-CM | POA: Diagnosis not present

## 2018-07-24 DIAGNOSIS — I359 Nonrheumatic aortic valve disorder, unspecified: Secondary | ICD-10-CM | POA: Diagnosis not present

## 2018-07-24 DIAGNOSIS — Z8679 Personal history of other diseases of the circulatory system: Secondary | ICD-10-CM

## 2018-07-24 DIAGNOSIS — Z8774 Personal history of (corrected) congenital malformations of heart and circulatory system: Secondary | ICD-10-CM

## 2018-07-24 DIAGNOSIS — Z954 Presence of other heart-valve replacement: Secondary | ICD-10-CM

## 2018-07-24 LAB — POCT INR: INR: 4.1 — AB (ref 2.0–3.0)

## 2018-07-24 NOTE — Patient Instructions (Signed)
Description   Skip today's dosage of Coumadin, then start taking 1 tablet  every day except 1.5 tablets on Mondays, Wednesdays and Fridays. Recheck INR in 2 weeks.  Call with any changes [phone].

## 2018-08-12 ENCOUNTER — Ambulatory Visit (INDEPENDENT_AMBULATORY_CARE_PROVIDER_SITE_OTHER): Payer: PRIVATE HEALTH INSURANCE | Admitting: *Deleted

## 2018-08-12 DIAGNOSIS — Z954 Presence of other heart-valve replacement: Secondary | ICD-10-CM | POA: Diagnosis not present

## 2018-08-12 DIAGNOSIS — I359 Nonrheumatic aortic valve disorder, unspecified: Secondary | ICD-10-CM | POA: Diagnosis not present

## 2018-08-12 DIAGNOSIS — Z8774 Personal history of (corrected) congenital malformations of heart and circulatory system: Secondary | ICD-10-CM | POA: Diagnosis not present

## 2018-08-12 DIAGNOSIS — I4892 Unspecified atrial flutter: Secondary | ICD-10-CM | POA: Diagnosis not present

## 2018-08-12 DIAGNOSIS — Z8679 Personal history of other diseases of the circulatory system: Secondary | ICD-10-CM

## 2018-08-12 DIAGNOSIS — I4891 Unspecified atrial fibrillation: Secondary | ICD-10-CM | POA: Diagnosis not present

## 2018-08-12 DIAGNOSIS — Z9889 Other specified postprocedural states: Secondary | ICD-10-CM | POA: Diagnosis not present

## 2018-08-12 DIAGNOSIS — Z5181 Encounter for therapeutic drug level monitoring: Secondary | ICD-10-CM | POA: Diagnosis not present

## 2018-08-12 LAB — POCT INR: INR: 3.3 — AB (ref 2.0–3.0)

## 2018-08-12 NOTE — Patient Instructions (Signed)
Description   Continue taking 1 tablet every day except 1.5 tablets on Mondays, Wednesdays and Fridays. Recheck INR in 3 weeks.  Call with any changes [phone].

## 2018-08-27 ENCOUNTER — Other Ambulatory Visit: Payer: Self-pay | Admitting: Cardiology

## 2018-09-04 ENCOUNTER — Ambulatory Visit (INDEPENDENT_AMBULATORY_CARE_PROVIDER_SITE_OTHER): Payer: PRIVATE HEALTH INSURANCE | Admitting: *Deleted

## 2018-09-04 DIAGNOSIS — I4892 Unspecified atrial flutter: Secondary | ICD-10-CM

## 2018-09-04 DIAGNOSIS — I4891 Unspecified atrial fibrillation: Secondary | ICD-10-CM

## 2018-09-04 DIAGNOSIS — I359 Nonrheumatic aortic valve disorder, unspecified: Secondary | ICD-10-CM | POA: Diagnosis not present

## 2018-09-04 DIAGNOSIS — Z5181 Encounter for therapeutic drug level monitoring: Secondary | ICD-10-CM

## 2018-09-04 DIAGNOSIS — Z9889 Other specified postprocedural states: Secondary | ICD-10-CM

## 2018-09-04 DIAGNOSIS — Z954 Presence of other heart-valve replacement: Secondary | ICD-10-CM

## 2018-09-04 DIAGNOSIS — Z8774 Personal history of (corrected) congenital malformations of heart and circulatory system: Secondary | ICD-10-CM

## 2018-09-04 DIAGNOSIS — Z8679 Personal history of other diseases of the circulatory system: Secondary | ICD-10-CM

## 2018-09-04 LAB — POCT INR: INR: 3.3 — AB (ref 2.0–3.0)

## 2018-09-04 NOTE — Patient Instructions (Signed)
Description   Continue taking 1 tablet every day except 1.5 tablets on Mondays, Wednesdays and Fridays. Recheck INR in 4 weeks.  Call with any changes [phone].

## 2018-09-26 ENCOUNTER — Other Ambulatory Visit: Payer: Self-pay | Admitting: Physician Assistant

## 2018-09-26 DIAGNOSIS — I5042 Chronic combined systolic (congestive) and diastolic (congestive) heart failure: Secondary | ICD-10-CM

## 2018-10-02 ENCOUNTER — Ambulatory Visit (INDEPENDENT_AMBULATORY_CARE_PROVIDER_SITE_OTHER): Payer: PRIVATE HEALTH INSURANCE

## 2018-10-02 DIAGNOSIS — I4891 Unspecified atrial fibrillation: Secondary | ICD-10-CM

## 2018-10-02 DIAGNOSIS — Z8679 Personal history of other diseases of the circulatory system: Secondary | ICD-10-CM

## 2018-10-02 DIAGNOSIS — Z9889 Other specified postprocedural states: Secondary | ICD-10-CM | POA: Diagnosis not present

## 2018-10-02 DIAGNOSIS — Z5181 Encounter for therapeutic drug level monitoring: Secondary | ICD-10-CM

## 2018-10-02 DIAGNOSIS — Z8774 Personal history of (corrected) congenital malformations of heart and circulatory system: Secondary | ICD-10-CM

## 2018-10-02 DIAGNOSIS — I359 Nonrheumatic aortic valve disorder, unspecified: Secondary | ICD-10-CM

## 2018-10-02 DIAGNOSIS — Z954 Presence of other heart-valve replacement: Secondary | ICD-10-CM | POA: Diagnosis not present

## 2018-10-02 DIAGNOSIS — I4892 Unspecified atrial flutter: Secondary | ICD-10-CM

## 2018-10-02 LAB — POCT INR: INR: 2.9 (ref 2.0–3.0)

## 2018-10-02 NOTE — Patient Instructions (Signed)
Description   Continue taking 1 tablet every day except 1.5 tablets on Mondays, Wednesdays and Fridays. Recheck INR in 5 weeks.  Call with any changes [phone].

## 2018-10-05 ENCOUNTER — Other Ambulatory Visit: Payer: Self-pay | Admitting: Physician Assistant

## 2018-10-05 DIAGNOSIS — I480 Paroxysmal atrial fibrillation: Secondary | ICD-10-CM

## 2018-10-06 ENCOUNTER — Telehealth: Payer: Self-pay | Admitting: Cardiology

## 2018-10-06 NOTE — Telephone Encounter (Signed)
   Primary Cardiologist: Tobias Alexander, MD  Chart reviewed as part of pre-operative protocol coverage. Patient was contacted 10/06/2018 in reference to pre-operative risk assessment for pending surgery as outlined below. She reports having a "hole in her heart" when she was a born which was repaired via cardiac catheterization as an infant, therefore no indication for SBE prophylaxis at this time.   Simple dental extractions are considered low risk procedures per guidelines and generally do not require any specific cardiac clearance. It is also generally accepted that for simple extractions and dental cleanings, there is no need to interrupt blood thinner therapy.   I will route this recommendation to the requesting party via Epic fax function and remove from pre-op pool.  Please call with questions.  Beatriz Stallion, PA-C 10/06/2018, 4:17 PM

## 2018-10-06 NOTE — Telephone Encounter (Signed)
   [facility] Pre-operative Risk Assessment    Request for surgical clearance:  1. What type of surgery is being performed? CLEANING, X-RAY   2. When is this surgery scheduled? 10/07/18   3. What type of clearance is required (medical clearance vs. Pharmacy clearance to hold med vs. Both)? BOTH  4. Are there any medications that need to be held prior to surgery and how long? PER DENTIST NO MEDS TO BE HELD, PT IS ON WARFARIN 5.    6. Practice name and name of physician performing surgery? [facility]    7. What is your office phone number [phone]    7.   What is your office fax number [phone]  8.   Anesthesia type (None, local, MAC, general) ? NO SEDATION   Sheila Branch 10/06/2018, 2:37 PM  _________________________________________________________________   (provider comments below)

## 2018-10-06 NOTE — Telephone Encounter (Signed)
Patient with diagnosis of atrial fibrillation and AVR on warfarin for anticoagulation.    Procedure: dental cleaning, X-rays Date of procedure: 10/07/2018  CHADS2-VASc score of  2 (CHF, HTN, AGE, DM2, stroke/tia x 2, CAD, AGE, female)  CrCl 70.9 Platelet count 139 (drawn in Dec 2017)  Per office protocol, we do not hold warfarin for dental cleanings or X-rays.    Of note chart lists patient as having congenital heart disease, which in most cases is an indication for antibiotic prophylaxis prior to any dental work.  Per guidelines the only exception to this would be completely repaired congenital heart disease, once 6 months past surgery date.

## 2018-10-06 NOTE — Telephone Encounter (Signed)
New Message   Pt has an abscess in her tooth and she spoke with the dentist and she will go tomorrow At [facility] [phone]  She said they want to give her antibiotics and a cleaning  She wants to know if this is okay Please call

## 2018-10-07 NOTE — Telephone Encounter (Signed)
° ° ° °*  STAT* If patient is at the pharmacy, call can be transferred to refill team.   1. Which medications need to be refilled? (please list name of each medication and dose if known) metoprolol tartrate (LOPRESSOR) 25 MG tablet 2. Which pharmacy/location (including street and city if local pharmacy) is medication to be sent to? [facility]  3. Do they need a 30 day or 90 day supply? 90

## 2018-10-09 ENCOUNTER — Other Ambulatory Visit: Payer: Self-pay | Admitting: *Deleted

## 2018-10-09 DIAGNOSIS — I480 Paroxysmal atrial fibrillation: Secondary | ICD-10-CM

## 2018-10-09 MED ORDER — METOPROLOL TARTRATE 25 MG PO TABS: 25.0000 mg | ORAL_TABLET | Freq: Two times a day (BID) | ORAL | 0 refills | Status: DC

## 2018-10-09 NOTE — Telephone Encounter (Signed)
New Message    *STAT* If patient is at the pharmacy, call can be transferred to refill team.   1. Which medications need to be refilled? (please list name of each medication and dose if known) Metoprolol 25mg   2. Which pharmacy/location (including street and city if local pharmacy) is medication to be sent to?  [facility] on [address]  3. Do they need a 30 day or 90 day supply? 90 day supply   Patient has appoint for office visit 11/06/18 but is about to run out of the medication now.

## 2018-10-30 ENCOUNTER — Other Ambulatory Visit: Payer: Self-pay | Admitting: Physician Assistant

## 2018-10-30 DIAGNOSIS — I5042 Chronic combined systolic (congestive) and diastolic (congestive) heart failure: Secondary | ICD-10-CM

## 2018-11-05 NOTE — Progress Notes (Signed)
Cardiology Office Note    Date:  11/06/2018   ID:  Sheila Branch, DOB 16-Oct-1970, MRN [id number]  PCP:  Hoy Register, MD  Cardiologist:  Dr. Delton See   Chief Complaint: 12  Months follow up  History of Present Illness:   Sheila Branch is a 48 y.o. 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   She first began to experience progressive dyspnea in late 2016 and was subsequently admitted in February 2017 with pneumonia and rapid atrial fibrillation. She had normal LV function at that time and require TEE and cardioversion. She was also noted to have severe aortic insufficiency and subvalvular aortic membrane. She was readmitted in late April 2017 with GI illness and diarrhea. In that setting, she had recurrent atrial fibrillation and was placed on amiodarone with subsequent conversion to sinus rhythm. She underwent diagnostic catheterization revealing normal coronary arteries with elevated right heart pressures including a pulmonary artery pressure of 90/44. Cardiac output and index were within normal limits. She was subsequently referred to thoracic surgery and it was felt that she would benefit from mechanical aortic valve replacement with resection of the subaortic membrane. This was ultimately performed on June 1 in addition to ligation of the patent ductus arteriosus and maze procedure.   Holter monitor 09/2017 short runs of SVT, Infrequent PVCs, frequent PACs (2100 in 48 hours).  Increased metoprolol to  BID when seen by me 09/25/17.  Here today for yearly follow up. No complains. She  quit smoking last year for few months but again restarted after broke up with boyfriend. The patient denies nausea, vomiting, fever, chest pain, palpitations, shortness of breath, orthopnea, PND, dizziness, syncope, cough, congestion, abdominal pain, hematochezia, melena, lower extremity edema.   Past Medical History:  Diagnosis Date  . Anxiety   . Aortic insufficiency    a. 10/2015 Echo: mod-sev AI;  b. 02/09/2016 23 mm Sorin Carbomedics Top Hat bileaflet mechanical valve -->anticoagulated w/ coumadin;  c. 02/17/2016 Echo: EF 25-30%, mod AS, mild AI, mild MR.  . CHD (congenital heart disease)    a. PDA s/p ligation 02/09/16.  Marland Kitchen Chronic combined systolic and diastolic congestive heart failure (HCC)    a. 10/2015 Echo: EF 50-55%;  b. 02/17/2016 Echo (post-op): EF 25-30%, anteroseptal AK.  Marland Kitchen COPD (chronic obstructive pulmonary disease) (HCC)    a. Remote tobacco abuse - quit 10/2015.  Marland Kitchen History of pneumonia   . NICM (nonischemic cardiomyopathy) (HCC)    a. 10/2015 Echo: EF 50-55%;  b. 01/2016 Cath: nl cors;  c. 02/17/2016 (post-op): EF 25-30%, anteroseptal AK.  Marland Kitchen PAF (paroxysmal atrial fibrillation) (HCC) 01/08/2016   a. 10/2015 s/p TEE & DCCV;  b. 12/2015 recurrent AF-->Amio-->converted to sinus.  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 01/2016 RHC: PA 90/44.  Marland Kitchen Prolonged QT interval    a. Stable @ ~ 508 msec on amio  daily.  . 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 02/09/2016 s/p 23 mm Sorin Carbomedics Top Hat bileaflet mechanical valve -->anticoagulated w/ coumadin.  . S/P repair of patent ductus arteriosus 02/09/2016  . Subvalvar aortic stenosis  a. 02/09/2016 s/p resection of subvalvular Ao membrane.    Past Surgical History:  Procedure Laterality Date  . AORTIC VALVE REPLACEMENT N/A 02/09/2016   Procedure: AORTIC VALVE REPLACEMENT USING A SIZE 23 CARBOMEDICS PROSTHESIS AND RESECTION OF SUBVALVAR AORTIC  STENOSIS;  Surgeon: Purcell Nails, MD;  Location: [facility] OR;  Service: Open Heart Surgery;  Laterality: N/A;  . CARDIAC CATHETERIZATION  childhood  . CARDIAC CATHETERIZATION N/A 01/11/2016   Procedure: Right/Left Heart Cath and Coronary Angiography;  Surgeon: Kathleene Hazel, MD;  Location: [facility] INVASIVE CV LAB;  Service: Cardiovascular;  Laterality: N/A;  . CARDIOVERSION N/A 11/09/2015   Procedure: CARDIOVERSION;  Surgeon: Lars Masson, MD;  Location: [facility] ENDOSCOPY;  Service: Cardiovascular;  Laterality: N/A;  . CESAREAN SECTION     pt. denies  . MAZE N/A 02/09/2016   Procedure: MAZE;  Surgeon: Purcell Nails, MD;  Location: [facility] OR;  Service: Open Heart Surgery;  Laterality: N/A;  . MOUTH SURGERY Bilateral 01-25-16  . MULTIPLE EXTRACTIONS WITH ALVEOLOPLASTY N/A 01/25/2016   Procedure: Extraction of tooth #'s 4,5,16-18,30,31 with alveoloplasty and gross debridement of remaining teeth.;  Surgeon: Charlynne Pander, DDS;  Location: [facility] OR;  Service: Oral Surgery;  Laterality: N/A;  . PATENT DUCTUS ARTERIOUS REPAIR N/A 02/09/2016   Procedure: PATENT DUCTUS ARTERIOSUS (PDA) REPAIR;  Surgeon: Purcell Nails, MD;  Location: [facility] OR;  Service: Open Heart Surgery;  Laterality: N/A;  . 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 TEE WITHOUT CARDIOVERSION N/A 11/09/2015   Procedure: TRANSESOPHAGEAL ECHOCARDIOGRAM (TEE);  Surgeon: Lars Masson, MD;  Location[facility] ENDOSCOPY;  Service: Cardiovascular;  Laterality: N/A;  . TEE WITHOUT CARDIOVERSION N/A 02/09/2016   Procedure: TRANSESOPHAGEAL ECHOCARDIOGRAM (TEE);  Surgeon: Purcell Nails, MD;  Location: [facility] OR;  Service: Open Heart Surgery;  Laterality: N/A;  . TUBAL LIGATION      Current Medications: Prior to Admission medications   Medication Sig Start Date End Date Taking? Authorizing Provider  albuterol (PROVENTIL HFA;VENTOLIN HFA) 108 (90 Base) MCG/ACT inhaler Inhale 2 puffs into the lungs every 6 (six) hours as needed for  wheezing or shortness of breath. 09/23/17   Hoy Register, MD  escitalopram (LEXAPRO) 20 MG tablet Take 20 mg by mouth daily.    [provider]  lisinopril (PRINIVIL,ZESTRIL) 2.5 MG tablet Take 1 tablet (2.5 mg total) by mouth daily. 10/30/18   Manson Passey, PA  metoprolol tartrate (LOPRESSOR) 25 MG tablet Take 1 tablet (25 mg total) by mouth 2 (two) times daily. 10/09/18   Lars Masson, MD  warfarin (COUMADIN) 1 MG tablet  TAKE AS DIRECTED BY COUMADIN CLINIC 02/10/18   Lars Masson, MD  warfarin (COUMADIN) 1 MG tablet TAKE AS DIRECTED BY  COUMADIN  CLINIC 08/28/18   Lars Masson, MD    Allergies:   Bupropion and Penicillins cross reactors   Social History   Socioeconomic History  . Marital status: Single    Spouse name: Not on file  . Number of children: Not on file  . Years of education: Not on file  . Highest education level: Not on file  Occupational History  . Not on file  Social Needs  . Financial resource strain: Not on file  . Food insecurity:    Worry: Not on file    Inability: Not on file  . Transportation needs:    Medical: Not on file    Non-medical: Not on file  Tobacco  Use  . Smoking status: Former Smoker    Packs/day: 1.00    Years: 23.00    Pack years: 23.00    Types: Cigarettes    Last attempt to quit: 09/18/2017    Years since quitting: 1.1  . Smokeless tobacco: Never Used  Substance and Sexual Activity  . Alcohol use: Not on file    Comment: last time 3 weeks ago  . Drug use: Yes    Types: Marijuana, Ketamine    Comment: last time two weeks ago  . Sexual activity: Not on file  Lifestyle  . Physical activity:    Days per week: Not on file    Minutes per session: Not on file  . Stress: Not on file  Relationships  . Social connections:    Talks on phone: Not on file    Gets together: Not on file    Attends religious service: Not on file    Active member of club or organization: Not on file    Attends meetings of  clubs or organizations: Not on file    Relationship status: Not on file  Other Topics Concern  . Not on file  Social History Narrative  . Not on file     Family History:  The patient's family history includes Heart disease in her mother; Other in her father.   ROS:   Please see the history of present illness.    ROS All other systems reviewed and are negative.   PHYSICAL EXAM:   VS:  BP 110/78   Pulse 66   Ht 5\' 3"  (1.6 m)   Wt 128 lb (58.1 kg)   BMI 22.67 kg/m    GEN: Well nourished, well developed, in no acute distress  HEENT: normal  Neck: no JVD, carotid bruits, or masses Cardiac: RRR; crisp murmurs, rubs, or gallops,no edema  Respiratory:  clear to auscultation bilaterally, normal work of breathing GI: soft, nontender, nondistended, + BS MS: no deformity or atrophy  Skin: warm and dry, no rash Neuro:  Alert and Oriented x 3, Strength and sensation are intact Psych: euthymic mood, full affect  Wt Readings from Last 3 Encounters:  11/06/18 128 lb (58.1 kg)  11/04/17 111 lb 6.4 oz (50.5 kg)  09/25/17 110 lb 6.4 oz (50.1 kg)      Studies/Labs Reviewed:   EKG:  EKG is ordered today.  The ekg ordered today demonstrates SR at rate of 66 bpm. TWI in lateral leads which is old. No acute changes.   Recent Labs: No results found for requested labs within last 8760 hours.   Lipid Panel    Component Value Date/Time   CHOL 145 11/04/2017 0942   TRIG 80 11/04/2017 0942   HDL 49 11/04/2017 0942   CHOLHDL 3.0 11/04/2017 0942   LDLCALC 80 11/04/2017 0942    Additional studies/ records that were reviewed today include:   48 hours holter 09/12/2017  Sinus rhythm to sinus tachycardia.  Infrequent PVCs, frequent PACs (2100 in 48 hours).  4 very short runs of SVT, the longest lasting 12 beats, differential includes atrial tachycardia vs atrial fibrillation)  Sinus rhythm to sinus tachycardia. Infrequent PVCs, frequent PACs (2100 in 48 hours). 4 very short runs of SVT,  the longest lasting 12 beats, differential includes atrial tachycardia vs atrial fibrillation.  Echocardiogram: 05/07/2016 Study Conclusions  - Left ventricle: The cavity size was normal. Wall thickness was normal. Systolic function was normal. The estimated ejection fraction was in the range of 50%  to 55%. Wall motion was normal; there were no regional wall motion abnormalities. Features are consistent with a pseudonormal left ventricular filling pattern, with concomitant abnormal relaxation and increased filling pressure (grade 2 diastolic dysfunction). - Aortic valve: A mechanical prosthesis was present and functioning normally. Peak velocity (S): 244 cm/s. - Left atrium: The atrium was mildly dilated. Anterior-posterior dimension: 43 mm. Volume/bsa, ES, (1-plane Simpson&'s, A2C): 32.6 ml/m^2.  Impressions:  - When compared to prior, EF is now normal.  Right/Left Heart Cath and Coronary Angiography  01/11/2016  Conclusion   1. No angiographic evidence of CAD 2. Elevated right sided pressures.   Recommendations: Further workup in regards to her aortic valve disease per cardiology consult team.     ASSESSMENT & PLAN:    1. PAF -Maintaining sinus rhythm. No bleeding issue. Continue BB and coumadin.    2.  Chronic diastolic heart failure/nonischemic cardiomyopathy  - last echocardiogram showed normalization of left ventricular function. - Euvolemic. Not on any diuretics.   3. S/p Mechanical AVR - No dyspnea or syncope. Will repeat echo next year for routine check.   4. Tobacco smoking  - Advised cessation.   Medication Adjustments/Labs and Tests Ordered: Current medicines are reviewed at length with the patient today.  Concerns regarding medicines are outlined above.  Medication changes, Labs and Tests ordered today are listed in the Patient Instructions below. Patient Instructions  Medication Instructions:  Your physician recommends that you  continue on your current medications as directed. Please refer to the Current Medication list given to you today.  If you need a refill on your cardiac medications before your next appointment, please call your pharmacy.   Lab work: NONE If you have labs (blood work) drawn today and your tests are completely normal, you will receive your results only by: Marland Kitchen MyChart Message (if you have MyChart) OR . A paper copy in the mail If you have any lab test that is abnormal or we need to change your treatment, we will call you to review the results.  Testing/Procedures: NONE  Follow-Up: At [facility], you and your health needs are our priority.  As part of our continuing mission to provide you with exceptional heart care, we have created designated Provider Care Teams.  These Care Teams include your primary Cardiologist (physician) and Advanced Practice Providers (APPs -  Physician Assistants and Nurse Practitioners) who all work together to provide you with the care you need, when you need it. You will need a follow up appointment in 12 months.  Please call our office 2 months in advance to schedule this appointment.  You may see Tobias Alexander, MD or one of the following Advanced Practice Providers on your designated Care Team:   [place], PA-C Ronie Spies, PA-C . Jacolyn Reedy, PA-C  Any Other Special Instructions Will Be Listed Below (If Applicable).       Lorelei Pont, [place]  11/06/2018 8:54 AM    [facility] [address] [place], [place], [place]  [address] Phone: [phone]; Fax: [phone]

## 2018-11-06 ENCOUNTER — Ambulatory Visit (INDEPENDENT_AMBULATORY_CARE_PROVIDER_SITE_OTHER): Payer: PRIVATE HEALTH INSURANCE | Admitting: Physician Assistant

## 2018-11-06 ENCOUNTER — Encounter: Payer: Self-pay | Admitting: Physician Assistant

## 2018-11-06 ENCOUNTER — Ambulatory Visit (INDEPENDENT_AMBULATORY_CARE_PROVIDER_SITE_OTHER): Payer: PRIVATE HEALTH INSURANCE | Admitting: Pharmacist

## 2018-11-06 VITALS — BP 110/78 | HR 66 | Ht 63.0 in | Wt 128.0 lb

## 2018-11-06 DIAGNOSIS — Z8679 Personal history of other diseases of the circulatory system: Secondary | ICD-10-CM

## 2018-11-06 DIAGNOSIS — I359 Nonrheumatic aortic valve disorder, unspecified: Secondary | ICD-10-CM

## 2018-11-06 DIAGNOSIS — Z9889 Other specified postprocedural states: Secondary | ICD-10-CM | POA: Diagnosis not present

## 2018-11-06 DIAGNOSIS — I480 Paroxysmal atrial fibrillation: Secondary | ICD-10-CM

## 2018-11-06 DIAGNOSIS — Z8774 Personal history of (corrected) congenital malformations of heart and circulatory system: Secondary | ICD-10-CM

## 2018-11-06 DIAGNOSIS — I4892 Unspecified atrial flutter: Secondary | ICD-10-CM

## 2018-11-06 DIAGNOSIS — Z954 Presence of other heart-valve replacement: Secondary | ICD-10-CM | POA: Diagnosis not present

## 2018-11-06 DIAGNOSIS — I4891 Unspecified atrial fibrillation: Secondary | ICD-10-CM | POA: Diagnosis not present

## 2018-11-06 DIAGNOSIS — Z720 Tobacco use: Secondary | ICD-10-CM

## 2018-11-06 DIAGNOSIS — I5042 Chronic combined systolic (congestive) and diastolic (congestive) heart failure: Secondary | ICD-10-CM

## 2018-11-06 DIAGNOSIS — Q249 Congenital malformation of heart, unspecified: Secondary | ICD-10-CM

## 2018-11-06 DIAGNOSIS — Z5181 Encounter for therapeutic drug level monitoring: Secondary | ICD-10-CM | POA: Diagnosis not present

## 2018-11-06 LAB — POCT INR: INR: 4 — AB (ref 2.0–3.0)

## 2018-11-06 NOTE — Patient Instructions (Addendum)
Medication Instructions:  Your physician recommends that you continue on your current medications as directed. Please refer to the Current Medication list given to you today.  If you need a refill on your cardiac medications before your next appointment, please call your pharmacy.   Lab work: NONE If you have labs (blood work) drawn today and your tests are completely normal, you will receive your results only by: . MyChart Message (if you have MyChart) OR . A paper copy in the mail If you have any lab test that is abnormal or we need to change your treatment, we will call you to review the results.  Testing/Procedures: NONE  Follow-Up: At [facility], you and your health needs are our priority.  As part of our continuing mission to provide you with exceptional heart care, we have created designated Provider Care Teams.  These Care Teams include your primary Cardiologist (physician) and Advanced Practice Providers (APPs -  Physician Assistants and Nurse Practitioners) who all work together to provide you with the care you need, when you need it. You will need a follow up appointment in 12 months.  Please call our office 2 months in advance to schedule this appointment.  You may see Katarina Nelson, MD or one of the following Advanced Practice Providers on your designated Care Team:   Brittainy Simmons, PA-C Dayna Dunn, PA-C . Michele Lenze, PA-C  Any Other Special Instructions Will Be Listed Below (If Applicable).   

## 2018-11-06 NOTE — Patient Instructions (Signed)
Skip coumadin tonight, then continue taking 1 tablet every day except 1.5 tablets on Mondays, Wednesdays and Fridays. Continue to eat one serving of greens per week. Recheck INR in 5 weeks.  Call with any changes [phone].

## 2018-11-09 ENCOUNTER — Other Ambulatory Visit: Payer: Self-pay | Admitting: Cardiology

## 2018-11-09 DIAGNOSIS — I480 Paroxysmal atrial fibrillation: Secondary | ICD-10-CM

## 2018-12-02 ENCOUNTER — Telehealth: Payer: Self-pay

## 2018-12-02 NOTE — Telephone Encounter (Signed)

## 2018-12-03 ENCOUNTER — Other Ambulatory Visit: Payer: Self-pay | Admitting: Physician Assistant

## 2018-12-03 DIAGNOSIS — I5042 Chronic combined systolic (congestive) and diastolic (congestive) heart failure: Secondary | ICD-10-CM

## 2018-12-04 ENCOUNTER — Ambulatory Visit (INDEPENDENT_AMBULATORY_CARE_PROVIDER_SITE_OTHER): Payer: PRIVATE HEALTH INSURANCE | Admitting: Pharmacist

## 2018-12-04 ENCOUNTER — Other Ambulatory Visit: Payer: Self-pay

## 2018-12-04 DIAGNOSIS — Z954 Presence of other heart-valve replacement: Secondary | ICD-10-CM

## 2018-12-04 DIAGNOSIS — I4892 Unspecified atrial flutter: Secondary | ICD-10-CM | POA: Diagnosis not present

## 2018-12-04 DIAGNOSIS — Z9889 Other specified postprocedural states: Secondary | ICD-10-CM | POA: Diagnosis not present

## 2018-12-04 DIAGNOSIS — I4891 Unspecified atrial fibrillation: Secondary | ICD-10-CM | POA: Diagnosis not present

## 2018-12-04 DIAGNOSIS — Z8679 Personal history of other diseases of the circulatory system: Secondary | ICD-10-CM

## 2018-12-04 DIAGNOSIS — I359 Nonrheumatic aortic valve disorder, unspecified: Secondary | ICD-10-CM | POA: Diagnosis not present

## 2018-12-04 DIAGNOSIS — Z5181 Encounter for therapeutic drug level monitoring: Secondary | ICD-10-CM | POA: Diagnosis not present

## 2018-12-04 DIAGNOSIS — Z8774 Personal history of (corrected) congenital malformations of heart and circulatory system: Secondary | ICD-10-CM

## 2018-12-04 LAB — POCT INR: INR: 4.2 — AB (ref 2.0–3.0)

## 2018-12-04 NOTE — Patient Instructions (Addendum)
Description   Skip coumadin tonight, then switch to taking 1 tablet every day except 1.5 tablets on Mondays and Fridays. Continue to eat one serving of greens per week. Recheck INR in 2 weeks.  Call with any changes #[phone].

## 2018-12-13 ENCOUNTER — Other Ambulatory Visit: Payer: Self-pay | Admitting: Cardiology

## 2018-12-17 ENCOUNTER — Telehealth: Payer: Self-pay

## 2018-12-17 NOTE — Telephone Encounter (Signed)

## 2018-12-18 ENCOUNTER — Other Ambulatory Visit: Payer: Self-pay

## 2018-12-18 ENCOUNTER — Ambulatory Visit (INDEPENDENT_AMBULATORY_CARE_PROVIDER_SITE_OTHER): Payer: PRIVATE HEALTH INSURANCE | Admitting: Pharmacist

## 2018-12-18 DIAGNOSIS — I359 Nonrheumatic aortic valve disorder, unspecified: Secondary | ICD-10-CM | POA: Diagnosis not present

## 2018-12-18 DIAGNOSIS — Z954 Presence of other heart-valve replacement: Secondary | ICD-10-CM | POA: Diagnosis not present

## 2018-12-18 DIAGNOSIS — Z8774 Personal history of (corrected) congenital malformations of heart and circulatory system: Secondary | ICD-10-CM | POA: Diagnosis not present

## 2018-12-18 DIAGNOSIS — Z5181 Encounter for therapeutic drug level monitoring: Secondary | ICD-10-CM

## 2018-12-18 DIAGNOSIS — I4891 Unspecified atrial fibrillation: Secondary | ICD-10-CM | POA: Diagnosis not present

## 2018-12-18 DIAGNOSIS — I4892 Unspecified atrial flutter: Secondary | ICD-10-CM

## 2018-12-18 DIAGNOSIS — Z8679 Personal history of other diseases of the circulatory system: Secondary | ICD-10-CM

## 2018-12-18 DIAGNOSIS — Z9889 Other specified postprocedural states: Secondary | ICD-10-CM

## 2018-12-18 LAB — POCT INR: INR: 3.3 — AB (ref 2.0–3.0)

## 2019-01-13 ENCOUNTER — Telehealth: Payer: Self-pay

## 2019-01-13 NOTE — Telephone Encounter (Signed)
lmom for prescreen  

## 2019-01-15 ENCOUNTER — Other Ambulatory Visit: Payer: Self-pay

## 2019-01-15 ENCOUNTER — Ambulatory Visit (INDEPENDENT_AMBULATORY_CARE_PROVIDER_SITE_OTHER): Payer: PRIVATE HEALTH INSURANCE

## 2019-01-15 DIAGNOSIS — Z8774 Personal history of (corrected) congenital malformations of heart and circulatory system: Secondary | ICD-10-CM

## 2019-01-15 DIAGNOSIS — I4891 Unspecified atrial fibrillation: Secondary | ICD-10-CM

## 2019-01-15 DIAGNOSIS — I359 Nonrheumatic aortic valve disorder, unspecified: Secondary | ICD-10-CM

## 2019-01-15 DIAGNOSIS — Z954 Presence of other heart-valve replacement: Secondary | ICD-10-CM | POA: Diagnosis not present

## 2019-01-15 DIAGNOSIS — Z9889 Other specified postprocedural states: Secondary | ICD-10-CM | POA: Diagnosis not present

## 2019-01-15 DIAGNOSIS — Z5181 Encounter for therapeutic drug level monitoring: Secondary | ICD-10-CM | POA: Diagnosis not present

## 2019-01-15 DIAGNOSIS — Z8679 Personal history of other diseases of the circulatory system: Secondary | ICD-10-CM

## 2019-01-15 DIAGNOSIS — I4892 Unspecified atrial flutter: Secondary | ICD-10-CM

## 2019-01-15 LAB — POCT INR: INR: 4.9 — AB (ref 2.0–3.0)

## 2019-01-15 NOTE — Patient Instructions (Signed)
Description   Called spoke with pt, advised to skip today's dosage of Coumadin, then start taking 1 tablet every day except 1.5 tablets on Mondays. Continue to eat one serving of greens per week. Recheck INR in 2 weeks.  Call with any changes #[phone].

## 2019-01-16 ENCOUNTER — Encounter: Payer: Self-pay | Admitting: Family Medicine

## 2019-01-28 ENCOUNTER — Telehealth: Payer: Self-pay

## 2019-01-28 NOTE — Telephone Encounter (Signed)

## 2019-01-29 ENCOUNTER — Ambulatory Visit (INDEPENDENT_AMBULATORY_CARE_PROVIDER_SITE_OTHER): Payer: PRIVATE HEALTH INSURANCE | Admitting: *Deleted

## 2019-01-29 DIAGNOSIS — I359 Nonrheumatic aortic valve disorder, unspecified: Secondary | ICD-10-CM

## 2019-01-29 DIAGNOSIS — I4892 Unspecified atrial flutter: Secondary | ICD-10-CM | POA: Diagnosis not present

## 2019-01-29 DIAGNOSIS — Z8774 Personal history of (corrected) congenital malformations of heart and circulatory system: Secondary | ICD-10-CM

## 2019-01-29 DIAGNOSIS — Z9889 Other specified postprocedural states: Secondary | ICD-10-CM

## 2019-01-29 DIAGNOSIS — Z954 Presence of other heart-valve replacement: Secondary | ICD-10-CM

## 2019-01-29 DIAGNOSIS — I4891 Unspecified atrial fibrillation: Secondary | ICD-10-CM | POA: Diagnosis not present

## 2019-01-29 DIAGNOSIS — Z5181 Encounter for therapeutic drug level monitoring: Secondary | ICD-10-CM | POA: Diagnosis not present

## 2019-01-29 DIAGNOSIS — Z8679 Personal history of other diseases of the circulatory system: Secondary | ICD-10-CM

## 2019-01-29 LAB — POCT INR: INR: 4 — AB (ref 2.0–3.0)

## 2019-01-29 NOTE — Patient Instructions (Addendum)
  Description   Called spoke with pt, advised to skip today's dosage of Coumadin, then start taking 1 tablet every day. Continue to eat one serving of greens per week. Recheck INR in 2 weeks.  Call with any changes #[phone].

## 2019-01-30 ENCOUNTER — Other Ambulatory Visit: Payer: Self-pay

## 2019-02-10 ENCOUNTER — Telehealth: Payer: Self-pay

## 2019-02-10 NOTE — Telephone Encounter (Signed)

## 2019-02-13 ENCOUNTER — Other Ambulatory Visit: Payer: Self-pay

## 2019-02-13 ENCOUNTER — Encounter: Payer: Self-pay | Admitting: Family Medicine

## 2019-02-13 ENCOUNTER — Ambulatory Visit (INDEPENDENT_AMBULATORY_CARE_PROVIDER_SITE_OTHER): Payer: PRIVATE HEALTH INSURANCE | Admitting: *Deleted

## 2019-02-13 DIAGNOSIS — Z9889 Other specified postprocedural states: Secondary | ICD-10-CM | POA: Diagnosis not present

## 2019-02-13 DIAGNOSIS — Z8679 Personal history of other diseases of the circulatory system: Secondary | ICD-10-CM

## 2019-02-13 DIAGNOSIS — I4892 Unspecified atrial flutter: Secondary | ICD-10-CM

## 2019-02-13 DIAGNOSIS — I359 Nonrheumatic aortic valve disorder, unspecified: Secondary | ICD-10-CM

## 2019-02-13 DIAGNOSIS — Z5181 Encounter for therapeutic drug level monitoring: Secondary | ICD-10-CM | POA: Diagnosis not present

## 2019-02-13 DIAGNOSIS — I4891 Unspecified atrial fibrillation: Secondary | ICD-10-CM

## 2019-02-13 DIAGNOSIS — Z954 Presence of other heart-valve replacement: Secondary | ICD-10-CM | POA: Diagnosis not present

## 2019-02-13 DIAGNOSIS — Z8774 Personal history of (corrected) congenital malformations of heart and circulatory system: Secondary | ICD-10-CM

## 2019-02-13 LAB — POCT INR: INR: 3.4 — AB (ref 2.0–3.0)

## 2019-02-13 NOTE — Patient Instructions (Signed)
Description   Continue taking 1 tablet every day. Continue to eat one serving of greens per week. Recheck INR in 3 weeks.  Call with any changes #[phone].

## 2019-03-02 ENCOUNTER — Telehealth: Payer: Self-pay

## 2019-03-02 NOTE — Telephone Encounter (Signed)
lmom for prescreen  

## 2019-03-04 ENCOUNTER — Encounter: Payer: Self-pay | Admitting: Nurse Practitioner

## 2019-03-04 ENCOUNTER — Telehealth: Payer: PRIVATE HEALTH INSURANCE | Admitting: Family

## 2019-03-04 ENCOUNTER — Other Ambulatory Visit: Payer: Self-pay

## 2019-03-04 ENCOUNTER — Ambulatory Visit: Payer: PRIVATE HEALTH INSURANCE | Attending: Nurse Practitioner | Admitting: Nurse Practitioner

## 2019-03-04 ENCOUNTER — Ambulatory Visit: Payer: PRIVATE HEALTH INSURANCE | Admitting: Nurse Practitioner

## 2019-03-04 DIAGNOSIS — R197 Diarrhea, unspecified: Secondary | ICD-10-CM

## 2019-03-04 DIAGNOSIS — K529 Noninfective gastroenteritis and colitis, unspecified: Secondary | ICD-10-CM | POA: Diagnosis not present

## 2019-03-04 DIAGNOSIS — K921 Melena: Secondary | ICD-10-CM

## 2019-03-04 MED ORDER — LOPERAMIDE HCL 2 MG PO CAPS: ORAL_CAPSULE | ORAL | 1 refills | Status: DC

## 2019-03-04 NOTE — Progress Notes (Signed)
Based on what you shared with me, I feel your condition warrants further evaluation and I recommend that you be seen for a face to face office visit.  Given that you are having black stools, you need to be seen face to face for further testing.   NOTE: If you entered your credit card information for this eVisit, you will not be charged. You may see a "hold" on your card for the $35 but that hold will drop off and you will not have a charge processed.  If you are having a true medical emergency please call 911.     For an urgent face to face visit, [facility] has five urgent care centers for your convenience:    [email or web address] to reserve your spot online an avoid wait times  [facility] [address] [place], [place] [address] Modified hours of operation: Monday-Friday, 12 PM to 6 PM  Closed Saturday & Sunday  *Across the street from [employer] (New Address!) [address] [place], [place] [address] *Just off [address], across the road from [place] hours of operation: Monday-Friday, 12 PM to 6 PM  Closed Saturday & Sunday   The following sites will take your insurance:  . [facility]    [phone]                  Get Driving Directions  [address], [place] [address] . 10 am to 8 pm Monday-Friday . 12 pm to 8 pm Saturday-Sunday   . [facility] at [place]                  Get Driving Directions  [address], [place], [place] [address] . 8 am to 8 pm Monday-Friday . 9 am to 6 pm Saturday . 11 am to 6 pm Sunday   . [facility] at [place]                  Get Driving Directions   [address] [place], [place] [address] . 8 am to 8 pm Monday-Friday . 8 am to 4 pm Saturday-Sunday    . [facility] at [place]                     Get Driving Directions  [phone]  [address], [place], [place] [address]  . Monday-Friday, 12 PM to 6 PM    Your e-visit answers were reviewed by a board certified advanced clinical practitioner to complete your personal care plan.  Thank you for using e-Visits.

## 2019-03-04 NOTE — Telephone Encounter (Signed)
Patient had a televisit with the provider.

## 2019-03-04 NOTE — Progress Notes (Signed)
Virtual Visit via Telephone Note Due to national recommendations of social distancing due to [place] 19, telehealth visit is felt to be most appropriate for this patient at this time.  I discussed the limitations, risks, security and privacy concerns of performing an evaluation and management service by telephone and the availability of in person appointments. I also discussed with the patient that there may be a patient responsible charge related to this service. The patient expressed understanding and agreed to proceed.    I connected with Sheila Branch on 03/04/19  at   4:10 PM EDT  EDT by telephone and verified that I am speaking with the correct person using two identifiers.   Consent I discussed the limitations, risks, security and privacy concerns of performing an evaluation and management service by telephone and the availability of in person appointments. I also discussed with the patient that there may be a patient responsible charge related to this service. The patient expressed understanding and agreed to proceed.   Location of Patient: Private Residence   Location of Provider: [place] and [employer] Office    Persons participating in Telemedicine visit: Geryl Rankins FNP-BC [place]    History of Present Illness: Telemedicine visit for: Diarrhea  Diarrhea: Patient complains of diarrhea. Onset of diarrhea was 2 weeks ago. Diarrhea is occurring intermittently. Patient describes diarrhea as semisolid and watery. Diarrhea has been associated with loss of appetite and mild headaches.  Patient denies nausea, vomiting, foul smelling stools,  blood in stool, illness in household contacts, recent antibiotic use, recent camping, recent travel, significant abdominal pain, unintentional weight loss vomiting.  Previous visits for diarrhea: none. Evaluation to date: none. Treatment to date: NONE She works at [employer]. Thinks some people may have been  sick at work but not sure if COVID was a factor. States diarrhea will last for a day or 2, go away and then return. Kaopectate helps relieve the symptoms. She doesn't know if she had fever but states she felt hot. Her thermometer does not work.  I have recommended COVID testing and patient was given information regarding community testing.   [email or web address]   Past Medical History:  Diagnosis Date  . Anxiety   . Aortic insufficiency    a. 10/2015 Echo: mod-sev AI;  b. 02/09/2016 23 mm Sorin Carbomedics Top Hat bileaflet mechanical valve -->anticoagulated w/ coumadin;  c. 02/17/2016 Echo: EF 25-30%, mod AS, mild AI, mild MR.  . CHD (congenital heart disease)    a. PDA s/p ligation 02/09/16.  Marland Kitchen Chronic combined systolic and diastolic congestive heart failure ([place])    a. 10/2015 Echo: EF 50-55%;  b. 02/17/2016 Echo (post-op): EF 25-30%, anteroseptal AK.  Marland Kitchen COPD (chronic obstructive pulmonary disease) ([place])    a. Remote tobacco abuse - quit 10/2015.  Marland Kitchen History of pneumonia   . NICM (nonischemic cardiomyopathy) ([place])    a. 10/2015 Echo: EF 50-55%;  b. 01/2016 Cath: nl cors;  c. 02/17/2016 (post-op): EF 25-30%, anteroseptal AK.  Marland Kitchen PAF (paroxysmal atrial fibrillation) ([place]) 01/08/2016   a. 10/2015 s/p TEE & DCCV;  b. 12/2015 recurrent AF-->Amio-->converted to sinus.  Marland Kitchen PAH (pulmonary arterial hypertension) with portal hypertension ([place])    a. 01/2016 RHC: PA 90/44.  Marland Kitchen Prolonged QT interval    a. Stable @ ~ 508 msec on amio 271m daily.  . 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 02/09/2016 s/p 23 mm Sorin Carbomedics Top  Hat bileaflet mechanical valve -->anticoagulated w/ coumadin.  . S/P repair of patent ductus arteriosus 02/09/2016  . Subvalvar aortic stenosis    a. 02/09/2016 s/p resection of subvalvular Ao membrane.    Past Surgical History:  Procedure Laterality Date  . AORTIC VALVE REPLACEMENT N/A 02/09/2016   Procedure: AORTIC VALVE REPLACEMENT USING  A SIZE 23 CARBOMEDICS PROSTHESIS AND RESECTION OF SUBVALVAR AORTIC STENOSIS;  Surgeon: Rexene Alberts, MD;  Location: [facility];  Service: Open Heart Surgery;  Laterality: N/A;  . CARDIAC CATHETERIZATION  childhood  . CARDIAC CATHETERIZATION N/A 01/11/2016   Procedure: Right/Left Heart Cath and Coronary Angiography;  Surgeon: Burnell Blanks, MD;  Location: [facility];  Service: Cardiovascular;  Laterality: N/A;  . CARDIOVERSION N/A 11/09/2015   Procedure: CARDIOVERSION;  Surgeon: Dorothy Spark, MD;  Location: [facility];  Service: Cardiovascular;  Laterality: N/A;  . CESAREAN SECTION     pt. denies  . MAZE N/A 02/09/2016   Procedure: MAZE;  Surgeon: Rexene Alberts, MD;  Location: [facility];  Service: Open Heart Surgery;  Laterality: N/A;  . MOUTH SURGERY Bilateral 01-25-16  . MULTIPLE EXTRACTIONS WITH ALVEOLOPLASTY N/A 01/25/2016   Procedure: Extraction of tooth #'s 6,5,53-74,82,70 with alveoloplasty and gross debridement of remaining teeth.;  Surgeon: Lenn Cal, DDS;  Location: [facility];  Service: Oral Surgery;  Laterality: N/A;  . PATENT DUCTUS ARTERIOUS REPAIR N/A 02/09/2016   Procedure: PATENT DUCTUS ARTERIOSUS (PDA) REPAIR;  Surgeon: Rexene Alberts, MD;  Location: [facility];  Service: Open Heart Surgery;  Laterality: N/A;  . 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 TEE WITHOUT CARDIOVERSION N/A 11/09/2015   Procedure: TRANSESOPHAGEAL ECHOCARDIOGRAM (TEE);  Surgeon: Dorothy Spark, MD;  Location: [facility];  Service: Cardiovascular;  Laterality: N/A;  . TEE WITHOUT CARDIOVERSION N/A 02/09/2016   Procedure: TRANSESOPHAGEAL ECHOCARDIOGRAM (TEE);  Surgeon: Rexene Alberts, MD;  Location: [facility];  Service: Open Heart Surgery;  Laterality: N/A;  . TUBAL LIGATION      Family History  Problem Relation Age of Onset  . Other Father        only met once, health unknown  . Heart disease Mother        "i think she died of heart issue"    Social History    Socioeconomic History  . Marital status: Single    Spouse name: Not on file  . Number of children: Not on file  . Years of education: Not on file  . Highest education level: Not on file  Occupational History  . Not on file  Social Needs  . Financial resource strain: Not on file  . Food insecurity    Worry: Not on file    Inability: Not on file  . Transportation needs    Medical: Not on file    Non-medical: Not on file  Tobacco Use  . Smoking status: Current Every Day Smoker    Packs/day: 1.00    Years: 23.00    Pack years: 23.00    Types: Cigarettes    Last attempt to quit: 09/18/2017    Years since quitting: 1.4  . Smokeless tobacco: Never Used  Substance and Sexual Activity  . Alcohol use: Not on file    Comment: last time 3 weeks ago  . Drug use: Not Currently    Types: Marijuana, Ketamine    Comment: last time two weeks ago  . Sexual activity: Not on file  Lifestyle  . Physical activity    Days per week: Not on file    Minutes per session: Not on file  . Stress: Not on file  Relationships  . Social Herbalist on phone: Not on file    Gets together: Not on file    Attends religious service: Not on file    Active member of club or organization: Not on file    Attends meetings of clubs or organizations: Not on file    Relationship status: Not on file  Other Topics Concern  . Not on file  Social History Narrative  . Not on file     Observations/Objective: Awake, alert and oriented x 3   Review of Systems  Constitutional: Negative for fever, malaise/fatigue and weight loss.       Loss of appetite  HENT: Negative.  Negative for nosebleeds.   Eyes: Negative.  Negative for blurred vision, double vision and photophobia.  Respiratory: Negative.  Negative for cough and shortness of breath.   Cardiovascular: Negative.  Negative for chest pain, palpitations and leg swelling.  Gastrointestinal: Positive for diarrhea. Negative for heartburn, nausea and  vomiting.  Musculoskeletal: Negative.  Negative for myalgias.  Neurological: Positive for headaches. Negative for dizziness, focal weakness and seizures.  Psychiatric/Behavioral: Negative.  Negative for suicidal ideas.    Assessment and Plan: Sheila Branch was evaluated  today for diarrhea.  Diagnoses and all orders for this visit:  Gastroenteritis  loperamide (IMODIUM) 2 MG capsule; Take 2 (two) capsules or 4 mg by mouth. Follow with 1 (one) capsule or 2 mg after each loose stool (maximum: 16 mg or 8 tablets per day)   COVID TEST PENDING!!!!  Follow Up Instructions Return if symptoms worsen or fail to improve.     I discussed the assessment and treatment plan with the patient. The patient was provided an opportunity to ask questions and all were answered. The patient agreed with the plan and demonstrated an understanding of the instructions.   The patient was advised to call back or seek an in-person evaluation if the symptoms worsen or if the condition fails to improve as anticipated.  I provided 21 minutes of non-face-to-face time during this encounter including median intraservice time, reviewing previous notes, labs, imaging, medications and explaining diagnosis and management.  Gildardo Pounds, FNP-BC

## 2019-03-06 ENCOUNTER — Other Ambulatory Visit: Payer: Self-pay | Admitting: Pharmacist

## 2019-03-06 ENCOUNTER — Encounter: Payer: Self-pay | Admitting: Nurse Practitioner

## 2019-03-06 MED ORDER — LOPERAMIDE HCL 2 MG PO CAPS: ORAL_CAPSULE | ORAL | 1 refills | Status: AC

## 2019-03-09 ENCOUNTER — Encounter: Payer: Self-pay | Admitting: Nurse Practitioner

## 2019-03-09 NOTE — Telephone Encounter (Signed)
Please address patient concerns regarding her letter.

## 2019-03-09 NOTE — Telephone Encounter (Signed)
Please address letter concern with PCP for patient

## 2019-03-17 ENCOUNTER — Ambulatory Visit (INDEPENDENT_AMBULATORY_CARE_PROVIDER_SITE_OTHER): Payer: PRIVATE HEALTH INSURANCE | Admitting: *Deleted

## 2019-03-17 ENCOUNTER — Other Ambulatory Visit: Payer: Self-pay

## 2019-03-17 DIAGNOSIS — Z5181 Encounter for therapeutic drug level monitoring: Secondary | ICD-10-CM

## 2019-03-17 DIAGNOSIS — Z8679 Personal history of other diseases of the circulatory system: Secondary | ICD-10-CM

## 2019-03-17 DIAGNOSIS — I4891 Unspecified atrial fibrillation: Secondary | ICD-10-CM

## 2019-03-17 DIAGNOSIS — Z954 Presence of other heart-valve replacement: Secondary | ICD-10-CM

## 2019-03-17 DIAGNOSIS — Z9889 Other specified postprocedural states: Secondary | ICD-10-CM | POA: Diagnosis not present

## 2019-03-17 DIAGNOSIS — I359 Nonrheumatic aortic valve disorder, unspecified: Secondary | ICD-10-CM | POA: Diagnosis not present

## 2019-03-17 DIAGNOSIS — Z8774 Personal history of (corrected) congenital malformations of heart and circulatory system: Secondary | ICD-10-CM

## 2019-03-17 DIAGNOSIS — I4892 Unspecified atrial flutter: Secondary | ICD-10-CM

## 2019-03-17 LAB — POCT INR: INR: 3.4 — AB (ref 2.0–3.0)

## 2019-03-17 NOTE — Patient Instructions (Signed)
Description   Continue taking 1 tablet every day. Continue to eat one serving of greens per week. Recheck INR in 4 weeks.  Call with any changes #[phone].

## 2019-04-14 ENCOUNTER — Other Ambulatory Visit: Payer: Self-pay

## 2019-04-14 ENCOUNTER — Ambulatory Visit (INDEPENDENT_AMBULATORY_CARE_PROVIDER_SITE_OTHER): Payer: PRIVATE HEALTH INSURANCE | Admitting: *Deleted

## 2019-04-14 DIAGNOSIS — Z5181 Encounter for therapeutic drug level monitoring: Secondary | ICD-10-CM | POA: Diagnosis not present

## 2019-04-14 DIAGNOSIS — Z9889 Other specified postprocedural states: Secondary | ICD-10-CM | POA: Diagnosis not present

## 2019-04-14 DIAGNOSIS — Z8679 Personal history of other diseases of the circulatory system: Secondary | ICD-10-CM

## 2019-04-14 DIAGNOSIS — I359 Nonrheumatic aortic valve disorder, unspecified: Secondary | ICD-10-CM | POA: Diagnosis not present

## 2019-04-14 DIAGNOSIS — I4891 Unspecified atrial fibrillation: Secondary | ICD-10-CM

## 2019-04-14 DIAGNOSIS — Z954 Presence of other heart-valve replacement: Secondary | ICD-10-CM | POA: Diagnosis not present

## 2019-04-14 DIAGNOSIS — Z8774 Personal history of (corrected) congenital malformations of heart and circulatory system: Secondary | ICD-10-CM

## 2019-04-14 DIAGNOSIS — I4892 Unspecified atrial flutter: Secondary | ICD-10-CM

## 2019-04-14 LAB — POCT INR: INR: 3.8 — AB (ref 2.0–3.0)

## 2019-04-14 NOTE — Patient Instructions (Signed)
Description    Hold today, then continue taking 1 tablet every day. Continue to eat one serving of greens per week. Recheck INR in 3 weeks.  Call with any changes #[phone].

## 2019-05-11 ENCOUNTER — Ambulatory Visit (INDEPENDENT_AMBULATORY_CARE_PROVIDER_SITE_OTHER): Payer: PRIVATE HEALTH INSURANCE | Admitting: *Deleted

## 2019-05-11 ENCOUNTER — Other Ambulatory Visit: Payer: Self-pay

## 2019-05-11 DIAGNOSIS — I4892 Unspecified atrial flutter: Secondary | ICD-10-CM

## 2019-05-11 DIAGNOSIS — Z954 Presence of other heart-valve replacement: Secondary | ICD-10-CM

## 2019-05-11 DIAGNOSIS — I359 Nonrheumatic aortic valve disorder, unspecified: Secondary | ICD-10-CM

## 2019-05-11 DIAGNOSIS — I4891 Unspecified atrial fibrillation: Secondary | ICD-10-CM

## 2019-05-11 DIAGNOSIS — Z5181 Encounter for therapeutic drug level monitoring: Secondary | ICD-10-CM | POA: Diagnosis not present

## 2019-05-11 DIAGNOSIS — Z8774 Personal history of (corrected) congenital malformations of heart and circulatory system: Secondary | ICD-10-CM

## 2019-05-11 DIAGNOSIS — Z9889 Other specified postprocedural states: Secondary | ICD-10-CM

## 2019-05-11 DIAGNOSIS — Z8679 Personal history of other diseases of the circulatory system: Secondary | ICD-10-CM

## 2019-05-11 LAB — POCT INR: INR: 4 — AB (ref 2.0–3.0)

## 2019-05-11 NOTE — Patient Instructions (Signed)
Description    Hold today, then start taking 1 tablet daily except for 1/2 a tablet on Saturdays. Continue to eat one serving of greens per week. Recheck INR in 3 weeks.  Call with any changes #[phone].

## 2019-05-24 ENCOUNTER — Other Ambulatory Visit: Payer: Self-pay | Admitting: Cardiology

## 2019-06-01 ENCOUNTER — Ambulatory Visit (INDEPENDENT_AMBULATORY_CARE_PROVIDER_SITE_OTHER): Payer: PRIVATE HEALTH INSURANCE | Admitting: *Deleted

## 2019-06-01 ENCOUNTER — Other Ambulatory Visit: Payer: Self-pay

## 2019-06-01 DIAGNOSIS — Z8774 Personal history of (corrected) congenital malformations of heart and circulatory system: Secondary | ICD-10-CM

## 2019-06-01 DIAGNOSIS — Z954 Presence of other heart-valve replacement: Secondary | ICD-10-CM

## 2019-06-01 DIAGNOSIS — Z5181 Encounter for therapeutic drug level monitoring: Secondary | ICD-10-CM

## 2019-06-01 DIAGNOSIS — I359 Nonrheumatic aortic valve disorder, unspecified: Secondary | ICD-10-CM | POA: Diagnosis not present

## 2019-06-01 DIAGNOSIS — Z9889 Other specified postprocedural states: Secondary | ICD-10-CM | POA: Diagnosis not present

## 2019-06-01 DIAGNOSIS — I4892 Unspecified atrial flutter: Secondary | ICD-10-CM

## 2019-06-01 DIAGNOSIS — I4891 Unspecified atrial fibrillation: Secondary | ICD-10-CM

## 2019-06-01 DIAGNOSIS — Z8679 Personal history of other diseases of the circulatory system: Secondary | ICD-10-CM

## 2019-06-01 LAB — POCT INR: INR: 3.2 — AB (ref 2.0–3.0)

## 2019-06-01 NOTE — Patient Instructions (Signed)
Description    Continue taking 1 tablet daily except for 1/2 a tablet on Saturdays. Continue to eat one serving of greens per week. Recheck INR in 4 weeks.  Call with any changes #[phone].       

## 2019-06-30 ENCOUNTER — Other Ambulatory Visit: Payer: Self-pay

## 2019-06-30 ENCOUNTER — Ambulatory Visit (INDEPENDENT_AMBULATORY_CARE_PROVIDER_SITE_OTHER): Payer: PRIVATE HEALTH INSURANCE | Admitting: *Deleted

## 2019-06-30 DIAGNOSIS — Z9889 Other specified postprocedural states: Secondary | ICD-10-CM | POA: Diagnosis not present

## 2019-06-30 DIAGNOSIS — Z954 Presence of other heart-valve replacement: Secondary | ICD-10-CM | POA: Diagnosis not present

## 2019-06-30 DIAGNOSIS — I4891 Unspecified atrial fibrillation: Secondary | ICD-10-CM

## 2019-06-30 DIAGNOSIS — Z5181 Encounter for therapeutic drug level monitoring: Secondary | ICD-10-CM

## 2019-06-30 DIAGNOSIS — I359 Nonrheumatic aortic valve disorder, unspecified: Secondary | ICD-10-CM

## 2019-06-30 DIAGNOSIS — Z8774 Personal history of (corrected) congenital malformations of heart and circulatory system: Secondary | ICD-10-CM

## 2019-06-30 DIAGNOSIS — Z8679 Personal history of other diseases of the circulatory system: Secondary | ICD-10-CM

## 2019-06-30 DIAGNOSIS — I4892 Unspecified atrial flutter: Secondary | ICD-10-CM

## 2019-06-30 LAB — POCT INR: INR: 4 — AB (ref 2.0–3.0)

## 2019-06-30 NOTE — Patient Instructions (Signed)
Description   Do not take any Coumadin/Warfarin today then continue taking 1 tablet daily except for 1/2 a tablet on Saturdays. Continue to eat one serving of greens per week. Recheck INR in 3 weeks.  Call with any changes #[phone].

## 2019-07-11 ENCOUNTER — Other Ambulatory Visit: Payer: Self-pay | Admitting: Cardiology

## 2019-07-20 ENCOUNTER — Ambulatory Visit (INDEPENDENT_AMBULATORY_CARE_PROVIDER_SITE_OTHER): Payer: PRIVATE HEALTH INSURANCE | Admitting: *Deleted

## 2019-07-20 ENCOUNTER — Other Ambulatory Visit: Payer: Self-pay

## 2019-07-20 DIAGNOSIS — Z9889 Other specified postprocedural states: Secondary | ICD-10-CM

## 2019-07-20 DIAGNOSIS — Z8774 Personal history of (corrected) congenital malformations of heart and circulatory system: Secondary | ICD-10-CM

## 2019-07-20 DIAGNOSIS — I359 Nonrheumatic aortic valve disorder, unspecified: Secondary | ICD-10-CM | POA: Diagnosis not present

## 2019-07-20 DIAGNOSIS — Z5181 Encounter for therapeutic drug level monitoring: Secondary | ICD-10-CM

## 2019-07-20 DIAGNOSIS — Z954 Presence of other heart-valve replacement: Secondary | ICD-10-CM | POA: Diagnosis not present

## 2019-07-20 DIAGNOSIS — I4891 Unspecified atrial fibrillation: Secondary | ICD-10-CM | POA: Diagnosis not present

## 2019-07-20 DIAGNOSIS — Z8679 Personal history of other diseases of the circulatory system: Secondary | ICD-10-CM

## 2019-07-20 DIAGNOSIS — I4892 Unspecified atrial flutter: Secondary | ICD-10-CM

## 2019-07-20 LAB — POCT INR: INR: 2 (ref 2.0–3.0)

## 2019-07-20 NOTE — Patient Instructions (Signed)
Description   Take an extra 1/2 tablet today and tomorrow, then continue taking 1 tablet daily except for 1/2 a tablet on Saturdays. Continue to eat one serving of greens per week. Recheck INR in 3 weeks.  Call with any changes #[phone].

## 2019-08-28 ENCOUNTER — Ambulatory Visit (INDEPENDENT_AMBULATORY_CARE_PROVIDER_SITE_OTHER): Payer: PRIVATE HEALTH INSURANCE | Admitting: *Deleted

## 2019-08-28 ENCOUNTER — Other Ambulatory Visit: Payer: Self-pay

## 2019-08-28 DIAGNOSIS — I359 Nonrheumatic aortic valve disorder, unspecified: Secondary | ICD-10-CM | POA: Diagnosis not present

## 2019-08-28 DIAGNOSIS — I4891 Unspecified atrial fibrillation: Secondary | ICD-10-CM

## 2019-08-28 DIAGNOSIS — Z8774 Personal history of (corrected) congenital malformations of heart and circulatory system: Secondary | ICD-10-CM

## 2019-08-28 DIAGNOSIS — Z954 Presence of other heart-valve replacement: Secondary | ICD-10-CM

## 2019-08-28 DIAGNOSIS — Z8679 Personal history of other diseases of the circulatory system: Secondary | ICD-10-CM

## 2019-08-28 DIAGNOSIS — Z9889 Other specified postprocedural states: Secondary | ICD-10-CM | POA: Diagnosis not present

## 2019-08-28 DIAGNOSIS — Z5181 Encounter for therapeutic drug level monitoring: Secondary | ICD-10-CM

## 2019-08-28 DIAGNOSIS — I4892 Unspecified atrial flutter: Secondary | ICD-10-CM

## 2019-08-28 LAB — POCT INR: INR: 2.9 (ref 2.0–3.0)

## 2019-08-28 NOTE — Patient Instructions (Addendum)
Description    Continue taking 1 tablet daily except for 1/2 a tablet on Saturdays. Continue to eat one serving of greens per week. Recheck INR in 4 weeks.  Call with any changes #[phone].

## 2019-09-25 ENCOUNTER — Other Ambulatory Visit: Payer: Self-pay

## 2019-09-25 ENCOUNTER — Ambulatory Visit (INDEPENDENT_AMBULATORY_CARE_PROVIDER_SITE_OTHER): Payer: PRIVATE HEALTH INSURANCE | Admitting: *Deleted

## 2019-09-25 DIAGNOSIS — Z5181 Encounter for therapeutic drug level monitoring: Secondary | ICD-10-CM

## 2019-09-25 DIAGNOSIS — Z954 Presence of other heart-valve replacement: Secondary | ICD-10-CM | POA: Diagnosis not present

## 2019-09-25 DIAGNOSIS — I4891 Unspecified atrial fibrillation: Secondary | ICD-10-CM

## 2019-09-25 DIAGNOSIS — I359 Nonrheumatic aortic valve disorder, unspecified: Secondary | ICD-10-CM

## 2019-09-25 DIAGNOSIS — Z9889 Other specified postprocedural states: Secondary | ICD-10-CM | POA: Diagnosis not present

## 2019-09-25 DIAGNOSIS — I4892 Unspecified atrial flutter: Secondary | ICD-10-CM | POA: Diagnosis not present

## 2019-09-25 DIAGNOSIS — Z8679 Personal history of other diseases of the circulatory system: Secondary | ICD-10-CM

## 2019-09-25 DIAGNOSIS — Z8774 Personal history of (corrected) congenital malformations of heart and circulatory system: Secondary | ICD-10-CM

## 2019-09-25 LAB — POCT INR: INR: 4 — AB (ref 2.0–3.0)

## 2019-09-25 NOTE — Patient Instructions (Addendum)
Description   Hold today's dose then continue taking 1 tablet daily except for 1/2 tablet on Saturdays. Continue to eat one serving of greens per week. Recheck INR in 3 weeks.  Call with any changes #[phone].

## 2019-10-16 ENCOUNTER — Ambulatory Visit (INDEPENDENT_AMBULATORY_CARE_PROVIDER_SITE_OTHER): Payer: PRIVATE HEALTH INSURANCE | Admitting: *Deleted

## 2019-10-16 ENCOUNTER — Other Ambulatory Visit: Payer: Self-pay

## 2019-10-16 DIAGNOSIS — Z5181 Encounter for therapeutic drug level monitoring: Secondary | ICD-10-CM

## 2019-10-16 DIAGNOSIS — I359 Nonrheumatic aortic valve disorder, unspecified: Secondary | ICD-10-CM | POA: Diagnosis not present

## 2019-10-16 DIAGNOSIS — Z954 Presence of other heart-valve replacement: Secondary | ICD-10-CM

## 2019-10-16 DIAGNOSIS — I4891 Unspecified atrial fibrillation: Secondary | ICD-10-CM

## 2019-10-16 DIAGNOSIS — Z8679 Personal history of other diseases of the circulatory system: Secondary | ICD-10-CM

## 2019-10-16 DIAGNOSIS — Z9889 Other specified postprocedural states: Secondary | ICD-10-CM | POA: Diagnosis not present

## 2019-10-16 DIAGNOSIS — Z8774 Personal history of (corrected) congenital malformations of heart and circulatory system: Secondary | ICD-10-CM

## 2019-10-16 DIAGNOSIS — I4892 Unspecified atrial flutter: Secondary | ICD-10-CM

## 2019-10-16 LAB — POCT INR: INR: 2.5 (ref 2.0–3.0)

## 2019-10-16 NOTE — Patient Instructions (Signed)
Description   Continue taking 1 tablet daily except for 1/2 tablet on Saturdays. Continue to eat one serving of greens per week. Recheck INR in 4 weeks.  Call with any changes #[phone].

## 2019-10-29 ENCOUNTER — Other Ambulatory Visit: Payer: Self-pay | Admitting: Cardiology

## 2019-11-12 ENCOUNTER — Ambulatory Visit (INDEPENDENT_AMBULATORY_CARE_PROVIDER_SITE_OTHER): Payer: PRIVATE HEALTH INSURANCE | Admitting: *Deleted

## 2019-11-12 ENCOUNTER — Other Ambulatory Visit: Payer: Self-pay

## 2019-11-12 DIAGNOSIS — Z954 Presence of other heart-valve replacement: Secondary | ICD-10-CM

## 2019-11-12 DIAGNOSIS — Z8774 Personal history of (corrected) congenital malformations of heart and circulatory system: Secondary | ICD-10-CM

## 2019-11-12 DIAGNOSIS — Z5181 Encounter for therapeutic drug level monitoring: Secondary | ICD-10-CM

## 2019-11-12 DIAGNOSIS — I359 Nonrheumatic aortic valve disorder, unspecified: Secondary | ICD-10-CM

## 2019-11-12 DIAGNOSIS — I4892 Unspecified atrial flutter: Secondary | ICD-10-CM

## 2019-11-12 DIAGNOSIS — Z9889 Other specified postprocedural states: Secondary | ICD-10-CM | POA: Diagnosis not present

## 2019-11-12 DIAGNOSIS — I4891 Unspecified atrial fibrillation: Secondary | ICD-10-CM

## 2019-11-12 DIAGNOSIS — Z8679 Personal history of other diseases of the circulatory system: Secondary | ICD-10-CM

## 2019-11-12 LAB — POCT INR: INR: 1.6 — AB (ref 2.0–3.0)

## 2019-11-12 NOTE — Progress Notes (Signed)
Pt stated that she missed her warfarin because she had trouble getting it refilled with [facility]. Pt stated that she was finally able to get a 1 month supply. Called and spoke to [facility] who stated that they need [place] to say it was okay because her warfarin is changing manufactures. Informed them it was okay.

## 2019-11-12 NOTE — Patient Instructions (Signed)
Description    Take 1.5 tablets today and tomorrow, then continue taking 1 tablet daily except for 1/2 tablet on Saturdays. Continue to eat one serving of greens per week. Recheck INR in 1.5 weeks.  Call with any changes #[phone].

## 2019-11-25 ENCOUNTER — Other Ambulatory Visit: Payer: Self-pay | Admitting: Cardiology

## 2019-11-25 DIAGNOSIS — I480 Paroxysmal atrial fibrillation: Secondary | ICD-10-CM

## 2019-12-11 ENCOUNTER — Ambulatory Visit: Payer: PRIVATE HEALTH INSURANCE | Attending: Internal Medicine

## 2019-12-11 DIAGNOSIS — Z23 Encounter for immunization: Secondary | ICD-10-CM

## 2019-12-11 NOTE — Progress Notes (Signed)
   Covid-19 Vaccination Clinic  Name:  CHALONDA SCHLATTER    MRN: [id number] DOB: [birth year]  12/11/2019  Ms. Blondin was observed post Covid-19 immunization for 15 minutes without incident. She was provided with Vaccine Information Sheet and instruction to access the V-Safe system.   Ms. Wind was instructed to call 911 with any severe reactions post vaccine: Marland Kitchen Difficulty breathing  . Swelling of face and throat  . A fast heartbeat  . A bad rash all over body  . Dizziness and weakness   Immunizations Administered    Name Date Dose VIS Date Route   Pfizer COVID-19 Vaccine 12/11/2019  4:44 PM 0.3 mL 08/21/2019 Intramuscular   Manufacturer: [employer], [employer]   Lot: [id number]   NDC: [address]-3

## 2020-01-04 ENCOUNTER — Ambulatory Visit: Payer: PRIVATE HEALTH INSURANCE | Attending: Internal Medicine

## 2020-01-04 DIAGNOSIS — Z23 Encounter for immunization: Secondary | ICD-10-CM

## 2020-01-04 NOTE — Progress Notes (Signed)
   Covid-19 Vaccination Clinic  Name:  Sheila Branch    MRN: [id number] DOB: Oct 13, 1970  01/04/2020  [name] was observed post Covid-19 immunization for 15 minutes without incident. She was provided with Vaccine Information Sheet and instruction to access the V-Safe system.   [name] was instructed to call 911 with any severe reactions post vaccine: Marland Kitchen Difficulty breathing  . Swelling of face and throat  . A fast heartbeat  . A bad rash all over body  . Dizziness and weakness   Immunizations Administered    Name Date Dose VIS Date Route   Pfizer COVID-19 Vaccine 01/04/2020  3:27 PM 0.3 mL 11/04/2018 Intramuscular   Manufacturer: [employer], [employer]   Lot: [id number]   NDC: [address]-3
